# Patient Record
Sex: Female | Born: 1950 | Race: White | Hispanic: No | Marital: Single | Smoking: Current every day smoker
Health system: Southern US, Community
[De-identification: ages and names within clinical notes are randomized; demographics above are authoritative.]

## PROBLEM LIST (undated history)

## (undated) DIAGNOSIS — I82409 Acute embolism and thrombosis of unspecified deep veins of unspecified lower extremity: Secondary | ICD-10-CM

## (undated) DIAGNOSIS — I2510 Atherosclerotic heart disease of native coronary artery without angina pectoris: Secondary | ICD-10-CM

## (undated) DIAGNOSIS — G510 Bell's palsy: Secondary | ICD-10-CM

## (undated) DIAGNOSIS — R0600 Dyspnea, unspecified: Secondary | ICD-10-CM

## (undated) DIAGNOSIS — F419 Anxiety disorder, unspecified: Secondary | ICD-10-CM

## (undated) DIAGNOSIS — F329 Major depressive disorder, single episode, unspecified: Secondary | ICD-10-CM

## (undated) DIAGNOSIS — I10 Essential (primary) hypertension: Secondary | ICD-10-CM

## (undated) DIAGNOSIS — D649 Anemia, unspecified: Secondary | ICD-10-CM

## (undated) DIAGNOSIS — M1990 Unspecified osteoarthritis, unspecified site: Secondary | ICD-10-CM

## (undated) DIAGNOSIS — K219 Gastro-esophageal reflux disease without esophagitis: Secondary | ICD-10-CM

## (undated) DIAGNOSIS — F32A Depression, unspecified: Secondary | ICD-10-CM

## (undated) DIAGNOSIS — I2699 Other pulmonary embolism without acute cor pulmonale: Secondary | ICD-10-CM

## (undated) DIAGNOSIS — I219 Acute myocardial infarction, unspecified: Secondary | ICD-10-CM

## (undated) DIAGNOSIS — R42 Dizziness and giddiness: Secondary | ICD-10-CM

## (undated) DIAGNOSIS — Q639 Congenital malformation of kidney, unspecified: Secondary | ICD-10-CM

## (undated) DIAGNOSIS — E785 Hyperlipidemia, unspecified: Secondary | ICD-10-CM

## (undated) DIAGNOSIS — H35039 Hypertensive retinopathy, unspecified eye: Secondary | ICD-10-CM

## (undated) DIAGNOSIS — G629 Polyneuropathy, unspecified: Secondary | ICD-10-CM

## (undated) DIAGNOSIS — Z720 Tobacco use: Secondary | ICD-10-CM

## (undated) DIAGNOSIS — R011 Cardiac murmur, unspecified: Secondary | ICD-10-CM

## (undated) HISTORY — DX: Hypertensive retinopathy, unspecified eye: H35.039

## (undated) HISTORY — DX: Acute myocardial infarction, unspecified: I21.9

## (undated) HISTORY — PX: TONSILLECTOMY: SUR1361

## (undated) HISTORY — PX: KNEE ARTHROSCOPY W/ MENISCAL REPAIR: SHX1877

## (undated) HISTORY — PX: CAROTID ARTERY ANGIOPLASTY: SHX1300

## (undated) HISTORY — PX: CARPAL TUNNEL RELEASE: SHX101

## (undated) HISTORY — DX: Dizziness and giddiness: R42

## (undated) NOTE — *Deleted (*Deleted)
[facility] - Clinic Note  01/23/2020     CHIEF COMPLAINT Patient presents for No chief complaint on file.   HISTORY OF PRESENT ILLNESS: Jennifer [name] is a 20 y.o. female who presents to the clinic today for:   pt is here on the referral of Dr. Despina Arias for concern of fol and floaters, she states she was sitting on her porch yesterday and suddenly "lost vision" in her right eye, she states she started to see fol and splotchy floaters as well as a "grey pudding" clouding her vision, she states it started to clear up a little yesterday afternoon, pt has had cataract sx with Dr. Genia Del in April  Referring physician: Elfredia Nevins, MD [address] [place],  [place] [address]  HISTORICAL INFORMATION:   Selected notes from the MEDICAL RECORD NUMBER Referred by Dr. Nedra Hai:  Ocular Hx- PMH-    CURRENT MEDICATIONS: No current outpatient medications on file. (Ophthalmic Drugs)   No current facility-administered medications for this visit. (Ophthalmic Drugs)   Current Outpatient Medications (Other)  Medication Sig  . allopurinol (ZYLOPRIM) 300 MG tablet Take 300 mg by mouth daily.  Marland Kitchen aspirin EC 81 MG EC tablet Take 1 tablet (81 mg total) by mouth daily.  Marland Kitchen atorvastatin (LIPITOR) 20 MG tablet   . calcium carbonate (TUMS - DOSED IN MG ELEMENTAL CALCIUM) 500 MG chewable tablet Chew 2 tablets by mouth 3 (three) times daily with meals.   . carvedilol (COREG) 3.125 MG tablet TAKE (1) TABLET BY MOUTH TWICE DAILY.  Marland Kitchen Cholecalciferol (VITAMIN D) 2000 units CAPS Take 2,000 Units by mouth daily.  . DULoxetine (CYMBALTA) 30 MG capsule Take 30 mg by mouth daily.  Marland Kitchen FeFum-FePoly-FA-B Cmp-C-Biot (INTEGRA PLUS) CAPS Take 1 capsule daily with a meal  . gabapentin (NEURONTIN) 600 MG tablet Take 300-600 mg by mouth See admin instructions. Take 300 mg twice daily IN THE MORNING AND AT NOON AND  600 mg at bedtime.  Marland Kitchen lisinopril (ZESTRIL) 40 MG tablet Take 1 tablet (40 mg total) by  mouth daily.  . Omega-3 Fatty Acids (FISH OIL) 1360 MG CAPS Take by mouth.  . Oxycodone HCl 10 MG TABS Take 10 mg by mouth 3 (three) times daily as needed. For pain  . rivaroxaban (XARELTO) 20 MG TABS tablet Take 20 mg by mouth daily with supper.   No current facility-administered medications for this visit. (Other)      REVIEW OF SYSTEMS:    ALLERGIES Allergies  Allergen Reactions  . Bee Venom Anaphylaxis    PAST MEDICAL HISTORY Past Medical History:  Diagnosis Date  . Anemia   . Anxiety   . Arthritis    pt. denies  . Bell's palsy   . CAD (coronary artery disease)    a. Cath 12/2014 - mild-moderate non-obstructive CAD with 60% mid RCA, 50% mid Cx-distal Cx, 30% distal LAD, 25% D2, EF 55-65%. b. 02/2017: NSTEMI with cath showing 60% LM stenosis and 100% RCA stenosis --> CT Surgery consulted with CABG on 02/21/2017 (LIMA-LAD, SVG-OM, and SVG-PDA)  . Depression   . Dizziness   . DVT (deep venous thrombosis) (HCC)    on Xarelto  . Dyspnea    W/ EXERTION pt. denies  . GERD (gastroesophageal reflux disease)   . Heart murmur    DX   AT BIRTH  . Hyperlipidemia   . Hypertension   . 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 Myocardial infarction (HCC)   .  Neuropathy   . PE (pulmonary embolism)   . Tobacco abuse    Past Surgical History:  Procedure Laterality Date  . ABDOMINAL HYSTERECTOMY  1974  . CARDIAC CATHETERIZATION N/A 01/08/2015   Procedure: Left Heart Cath and Coronary Angiography;  Surgeon: Dolores Patty, MD;  Location: [facility] INVASIVE CV LAB;  Service: Cardiovascular;  Laterality: N/A;  . CAROTID ARTERY ANGIOPLASTY     stent right side  . CARPAL TUNNEL RELEASE     RIGHT  . COLONOSCOPY WITH PROPOFOL N/A 02/16/2017   Procedure: COLONOSCOPY WITH PROPOFOL;  Surgeon: Rachael Fee, MD;  Location: [facility] ENDOSCOPY;  Service: Endoscopy;  Laterality: N/A;  . CORONARY ARTERY BYPASS GRAFT N/A 02/21/2017   Procedure: CORONARY ARTERY BYPASS GRAFTING (CABG) times  three. LIMA to LAD, SVG to OM and SVG to PDA;  Surgeon: Loreli Slot, MD;  Location: [facility] OR;  Service: Open Heart Surgery;  Laterality: N/A;  . CORONARY STENT PLACEMENT Right 1980  . ENTEROSCOPY N/A 06/14/2017   Procedure: ENTEROSCOPY;  Surgeon: Rachael Fee, MD;  Location: [facility] ENDOSCOPY;  Service: Endoscopy;  Laterality: N/A;  . ESOPHAGOGASTRODUODENOSCOPY (EGD) WITH PROPOFOL N/A 02/16/2017   Procedure: ESOPHAGOGASTRODUODENOSCOPY (EGD) WITH PROPOFOL;  Surgeon: Rachael Fee, MD;  Location: [facility] ENDOSCOPY;  Service: Endoscopy;  Laterality: N/A;  . GIVENS CAPSULE STUDY N/A 05/08/2017   Procedure: GIVENS CAPSULE STUDY;  Surgeon: Rachael Fee, MD;  Location: [facility] ENDOSCOPY;  Service: Endoscopy;  Laterality: N/A;  . KNEE ARTHROSCOPY W/ MENISCAL REPAIR     LEFT KNEE   FORMED DVT (SURG TO REMOVE BLOOD CLOT)  . LEFT HEART CATH AND CORONARY ANGIOGRAPHY N/A 02/19/2017   Procedure: LEFT HEART CATH AND CORONARY ANGIOGRAPHY;  Surgeon: Yvonne Kendall, MD;  Location: [facility] INVASIVE CV LAB;  Service: Cardiovascular;  Laterality: N/A;  . TEE WITHOUT CARDIOVERSION N/A 02/21/2017   Procedure: TRANSESOPHAGEAL ECHOCARDIOGRAM (TEE);  Surgeon: Loreli Slot, MD;  Location: [facility] OR;  Service: Open Heart Surgery;  Laterality: N/A;  . TONSILLECTOMY      FAMILY HISTORY Family History  Problem Relation Age of Onset  . Heart attack Mother   . AAA (abdominal aortic aneurysm) Mother   . Heart attack Father   . Heart disease Father   . Heart attack Brother        CABG  . Heart disease Brother   . Heart attack Brother        CABG  . Heart attack Brother        CABG  . Ovarian cancer Maternal Grandmother   . Heart disease Maternal Grandfather   . Dementia Paternal Grandmother     SOCIAL HISTORY Social History   Tobacco Use  . Smoking status: Current Every Day Smoker    Packs/day: 0.25    Years: 50.00    Pack years: 12.50    Types: Cigarettes    Start date: 06/18/1967  . Smokeless tobacco:  Never Used  . Tobacco comment: 3-4 a day  Vaping Use  . Vaping Use: Never used  Substance Use Topics  . Alcohol use: No    Alcohol/week: 0.0 standard drinks  . Drug use: No         OPHTHALMIC EXAM:  Not recorded     IMAGING AND PROCEDURES  Imaging and Procedures for 01/23/2020           ASSESSMENT/PLAN:    ICD-10-CM   1. Posterior vitreous detachment of right eye  H43.811   2. Retinal edema  H35.81 OCT, Retina -  OU - Both Eyes  3. Essential hypertension  I10   4. Hypertensive retinopathy of both eyes  H35.033   5. Pseudophakia, both eyes  Z96.1     1,2. Partial PVD / vitreous syneresis OD  - symptomatic flashes/floaters x1 day w/ transient decrease in vision -- improved today  - Discussed findings and prognosis  - No RT or RD on 360 scleral depressed exam  - Reviewed s/s of RT/RD  - Strict return precautions for any such RT/RD signs/symptoms  - f/u in 4-6 wks, sooner prn -- DFE/OCT  3,4. Hypertensive retinopathy OU - discussed importance of tight BP control - monitor  5. Pseudophakia OU  - s/p CE/IOL (Dr. Genia Del, 06/2019)  - IOL in good position, doing well  - monitor   Ophthalmic Meds Ordered this visit:  No orders of the defined types were placed in this encounter.      No follow-ups on file.  There are no Patient Instructions on file for this visit.   Explained the diagnoses, plan, and follow up with the patient and they expressed understanding.  Patient expressed understanding of the importance of proper follow up care.   This document serves as a record of services personally performed by Karie Chimera, MD, PhD. It was created on their behalf by Glee Arvin. Manson Passey, OA an ophthalmic technician. The creation of this record is the provider's dictation and/or activities during the visit.    Electronically signed by: Glee Arvin. Manson Passey, [place] 11.08.2021 1:10 PM   Karie Chimera, M.D., Ph.D. Diseases & Surgery of the Retina and Vitreous [facility]    Abbreviations: M myopia (nearsighted); A astigmatism; H hyperopia (farsighted); P presbyopia; Mrx spectacle prescription;  CTL contact lenses; OD right eye; OS left eye; OU both eyes  XT exotropia; ET esotropia; PEK punctate epithelial keratitis; PEE punctate epithelial erosions; DES dry eye syndrome; MGD meibomian gland dysfunction; ATs artificial tears; PFAT's preservative free artificial tears; NSC nuclear sclerotic cataract; PSC posterior subcapsular cataract; ERM epi-retinal membrane; PVD posterior vitreous detachment; RD retinal detachment; DM diabetes mellitus; DR diabetic retinopathy; NPDR non-proliferative diabetic retinopathy; PDR proliferative diabetic retinopathy; CSME clinically significant macular edema; DME diabetic macular edema; dbh dot blot hemorrhages; CWS cotton wool spot; POAG primary open angle glaucoma; C/D cup-to-disc ratio; HVF humphrey visual field; GVF goldmann visual field; OCT optical coherence tomography; IOP intraocular pressure; BRVO Branch retinal vein occlusion; CRVO central retinal vein occlusion; CRAO central retinal artery occlusion; BRAO branch retinal artery occlusion; RT retinal tear; SB scleral buckle; PPV pars plana vitrectomy; VH Vitreous hemorrhage; PRP panretinal laser photocoagulation; IVK intravitreal kenalog; VMT vitreomacular traction; MH Macular hole;  NVD neovascularization of the disc; NVE neovascularization elsewhere; AREDS age related eye disease study; ARMD age related macular degeneration; POAG primary open angle glaucoma; EBMD epithelial/anterior basement membrane dystrophy; ACIOL anterior chamber intraocular lens; IOL intraocular lens; PCIOL posterior chamber intraocular lens; Phaco/IOL phacoemulsification with intraocular lens placement; PRK photorefractive keratectomy; LASIK laser assisted in situ keratomileusis; HTN hypertension; DM diabetes mellitus; COPD chronic obstructive pulmonary disease

---

## 1972-03-13 HISTORY — PX: ABDOMINAL HYSTERECTOMY: SHX81

## 1978-03-13 HISTORY — PX: CORONARY STENT PLACEMENT: SHX1402

## 1998-09-21 ENCOUNTER — Inpatient Hospital Stay (HOSPITAL_COMMUNITY): Admission: AD | Admit: 1998-09-21 | Discharge: 1998-09-24 | Payer: Self-pay | Admitting: *Deleted

## 2000-03-09 ENCOUNTER — Inpatient Hospital Stay (HOSPITAL_COMMUNITY): Admission: EM | Admit: 2000-03-09 | Discharge: 2000-03-14 | Payer: Self-pay | Admitting: Psychiatry

## 2000-12-13 ENCOUNTER — Ambulatory Visit (HOSPITAL_COMMUNITY): Admission: RE | Admit: 2000-12-13 | Discharge: 2000-12-13 | Payer: Self-pay | Admitting: Internal Medicine

## 2000-12-13 ENCOUNTER — Encounter: Payer: Self-pay | Admitting: Internal Medicine

## 2001-07-31 ENCOUNTER — Inpatient Hospital Stay (HOSPITAL_COMMUNITY): Admission: AD | Admit: 2001-07-31 | Discharge: 2001-08-06 | Payer: Self-pay | Admitting: Family Medicine

## 2001-07-31 ENCOUNTER — Encounter: Payer: Self-pay | Admitting: Family Medicine

## 2001-08-01 ENCOUNTER — Encounter: Payer: Self-pay | Admitting: Family Medicine

## 2004-04-02 ENCOUNTER — Emergency Department (HOSPITAL_COMMUNITY): Admission: EM | Admit: 2004-04-02 | Discharge: 2004-04-02 | Payer: Self-pay | Admitting: Emergency Medicine

## 2004-06-05 ENCOUNTER — Emergency Department (HOSPITAL_COMMUNITY): Admission: EM | Admit: 2004-06-05 | Discharge: 2004-06-05 | Payer: Self-pay | Admitting: Emergency Medicine

## 2004-06-06 ENCOUNTER — Inpatient Hospital Stay (HOSPITAL_COMMUNITY): Admission: AD | Admit: 2004-06-06 | Discharge: 2004-06-10 | Payer: Self-pay | Admitting: Emergency Medicine

## 2004-06-12 ENCOUNTER — Emergency Department (HOSPITAL_COMMUNITY): Admission: EM | Admit: 2004-06-12 | Discharge: 2004-06-13 | Payer: Self-pay | Admitting: Emergency Medicine

## 2004-10-05 ENCOUNTER — Encounter: Admission: RE | Admit: 2004-10-05 | Discharge: 2004-10-18 | Payer: Self-pay | Admitting: Orthopaedic Surgery

## 2006-07-11 ENCOUNTER — Ambulatory Visit (HOSPITAL_COMMUNITY): Admission: RE | Admit: 2006-07-11 | Discharge: 2006-07-11 | Payer: Self-pay | Admitting: Family Medicine

## 2009-06-19 ENCOUNTER — Emergency Department (HOSPITAL_COMMUNITY): Admission: EM | Admit: 2009-06-19 | Discharge: 2009-06-19 | Payer: Self-pay | Admitting: Emergency Medicine

## 2009-06-20 ENCOUNTER — Ambulatory Visit (HOSPITAL_COMMUNITY)
Admission: RE | Admit: 2009-06-20 | Discharge: 2009-06-20 | Payer: Self-pay | Source: Home / Self Care | Admitting: Emergency Medicine

## 2010-04-07 ENCOUNTER — Ambulatory Visit (HOSPITAL_COMMUNITY)
Admission: RE | Admit: 2010-04-07 | Discharge: 2010-04-07 | Payer: Self-pay | Source: Home / Self Care | Attending: Gastroenterology | Admitting: Gastroenterology

## 2010-04-20 ENCOUNTER — Other Ambulatory Visit (HOSPITAL_COMMUNITY): Payer: Self-pay | Admitting: *Deleted

## 2010-04-20 ENCOUNTER — Other Ambulatory Visit: Payer: Self-pay

## 2010-04-20 DIAGNOSIS — R011 Cardiac murmur, unspecified: Secondary | ICD-10-CM

## 2010-04-25 ENCOUNTER — Ambulatory Visit (HOSPITAL_COMMUNITY)
Admission: RE | Admit: 2010-04-25 | Discharge: 2010-04-25 | Disposition: A | Discharge status: Home / Self Care | Payer: Self-pay | Source: Ambulatory Visit | Attending: Gastroenterology | Admitting: Gastroenterology

## 2010-04-25 DIAGNOSIS — R011 Cardiac murmur, unspecified: Secondary | ICD-10-CM

## 2010-04-25 DIAGNOSIS — I10 Essential (primary) hypertension: Secondary | ICD-10-CM | POA: Insufficient documentation

## 2010-04-27 ENCOUNTER — Ambulatory Visit (HOSPITAL_COMMUNITY)
Admission: RE | Admit: 2010-04-27 | Discharge: 2010-04-27 | Disposition: A | Discharge status: Home / Self Care | Payer: Self-pay | Source: Ambulatory Visit | Attending: Gastroenterology | Admitting: Gastroenterology

## 2010-04-27 DIAGNOSIS — I658 Occlusion and stenosis of other precerebral arteries: Secondary | ICD-10-CM | POA: Insufficient documentation

## 2010-04-27 DIAGNOSIS — I6529 Occlusion and stenosis of unspecified carotid artery: Secondary | ICD-10-CM | POA: Insufficient documentation

## 2010-04-27 DIAGNOSIS — R011 Cardiac murmur, unspecified: Secondary | ICD-10-CM | POA: Insufficient documentation

## 2010-06-01 LAB — PROTIME-INR
INR: 3.34 — ABNORMAL HIGH (ref 0.00–1.49)
Prothrombin Time: 33.6 seconds — ABNORMAL HIGH (ref 11.6–15.2)

## 2010-06-24 ENCOUNTER — Observation Stay (HOSPITAL_COMMUNITY): Payer: Self-pay

## 2010-06-24 ENCOUNTER — Emergency Department (HOSPITAL_COMMUNITY): Payer: Self-pay

## 2010-06-24 ENCOUNTER — Observation Stay (HOSPITAL_COMMUNITY)
Admission: EM | Admit: 2010-06-24 | Discharge: 2010-06-25 | Disposition: A | Discharge status: Home / Self Care | Payer: Self-pay | Attending: Emergency Medicine | Admitting: Emergency Medicine

## 2010-06-24 DIAGNOSIS — T45515A Adverse effect of anticoagulants, initial encounter: Secondary | ICD-10-CM | POA: Insufficient documentation

## 2010-06-24 DIAGNOSIS — I10 Essential (primary) hypertension: Secondary | ICD-10-CM | POA: Insufficient documentation

## 2010-06-24 DIAGNOSIS — E785 Hyperlipidemia, unspecified: Secondary | ICD-10-CM | POA: Insufficient documentation

## 2010-06-24 DIAGNOSIS — Z79899 Other long term (current) drug therapy: Secondary | ICD-10-CM | POA: Insufficient documentation

## 2010-06-24 DIAGNOSIS — M1710 Unilateral primary osteoarthritis, unspecified knee: Secondary | ICD-10-CM | POA: Insufficient documentation

## 2010-06-24 DIAGNOSIS — M79609 Pain in unspecified limb: Principal | ICD-10-CM | POA: Insufficient documentation

## 2010-06-24 DIAGNOSIS — R1032 Left lower quadrant pain: Secondary | ICD-10-CM | POA: Insufficient documentation

## 2010-06-24 DIAGNOSIS — Z86718 Personal history of other venous thrombosis and embolism: Secondary | ICD-10-CM | POA: Insufficient documentation

## 2010-06-24 DIAGNOSIS — F329 Major depressive disorder, single episode, unspecified: Secondary | ICD-10-CM | POA: Insufficient documentation

## 2010-06-24 DIAGNOSIS — F3289 Other specified depressive episodes: Secondary | ICD-10-CM | POA: Insufficient documentation

## 2010-06-24 DIAGNOSIS — R791 Abnormal coagulation profile: Secondary | ICD-10-CM | POA: Insufficient documentation

## 2010-06-24 DIAGNOSIS — E876 Hypokalemia: Secondary | ICD-10-CM | POA: Insufficient documentation

## 2010-06-24 DIAGNOSIS — IMO0002 Reserved for concepts with insufficient information to code with codable children: Secondary | ICD-10-CM | POA: Insufficient documentation

## 2010-06-24 LAB — DIFFERENTIAL
Basophils Absolute: 0 10*3/uL (ref 0.0–0.1)
Basophils Relative: 0 % (ref 0–1)
Eosinophils Absolute: 0.1 10*3/uL (ref 0.0–0.7)
Eosinophils Relative: 1 % (ref 0–5)
Lymphocytes Relative: 24 % (ref 12–46)
Lymphs Abs: 2.1 10*3/uL (ref 0.7–4.0)
Monocytes Absolute: 0.5 10*3/uL (ref 0.1–1.0)
Monocytes Relative: 6 % (ref 3–12)
Neutro Abs: 5.8 10*3/uL (ref 1.7–7.7)
Neutrophils Relative %: 69 % (ref 43–77)

## 2010-06-24 LAB — BASIC METABOLIC PANEL
BUN: 11 mg/dL (ref 6–23)
CO2: 26 mEq/L (ref 19–32)
Calcium: 9 mg/dL (ref 8.4–10.5)
Chloride: 100 mEq/L (ref 96–112)
Creatinine, Ser: 1.17 mg/dL (ref 0.4–1.2)
GFR calc Af Amer: 57 mL/min — ABNORMAL LOW (ref >60)
GFR calc non Af Amer: 47 mL/min — ABNORMAL LOW (ref >60)
Glucose, Bld: 128 mg/dL — ABNORMAL HIGH (ref 70–99)
Potassium: 2.9 mEq/L — ABNORMAL LOW (ref 3.5–5.1)
Sodium: 139 mEq/L (ref 135–145)

## 2010-06-24 LAB — CBC
HCT: 36.1 % (ref 36.0–46.0)
Hemoglobin: 12 g/dL (ref 12.0–15.0)
MCH: 28.1 pg (ref 26.0–34.0)
MCHC: 33.2 g/dL (ref 30.0–36.0)
MCV: 84.5 fL (ref 78.0–100.0)
Platelets: 360 10*3/uL (ref 150–400)
RBC: 4.27 MIL/uL (ref 3.87–5.11)
RDW: 14.6 % (ref 11.5–15.5)
WBC: 8.5 10*3/uL (ref 4.0–10.5)

## 2010-06-24 LAB — POTASSIUM: Potassium: 3.2 mEq/L — ABNORMAL LOW (ref 3.5–5.1)

## 2010-06-24 LAB — PROTIME-INR
INR: 2.11 — ABNORMAL HIGH (ref 0.00–1.49)
Prothrombin Time: 23.8 seconds — ABNORMAL HIGH (ref 11.6–15.2)

## 2010-06-24 MED ORDER — IOHEXOL 300 MG/ML  SOLN
120.0000 mL | Freq: Once | INTRAMUSCULAR | Status: AC | PRN
  Administered 2010-06-24: 120 mL via INTRAVENOUS

## 2010-06-25 LAB — URINALYSIS, ROUTINE W REFLEX MICROSCOPIC
Bilirubin Urine: NEGATIVE
Glucose, UA: NEGATIVE mg/dL
Hgb urine dipstick: NEGATIVE
Ketones, ur: NEGATIVE mg/dL
Nitrite: NEGATIVE
Protein, ur: NEGATIVE mg/dL
Specific Gravity, Urine: 1.025 (ref 1.005–1.030)
Urobilinogen, UA: 0.2 mg/dL (ref 0.0–1.0)
pH: 5 (ref 5.0–8.0)

## 2010-06-25 LAB — POTASSIUM: Potassium: 3.4 mEq/L — ABNORMAL LOW (ref 3.5–5.1)

## 2010-06-25 LAB — PROTIME-INR
INR: 3.01 — ABNORMAL HIGH (ref 0.00–1.49)
Prothrombin Time: 31.3 seconds — ABNORMAL HIGH (ref 11.6–15.2)

## 2010-06-27 LAB — URINE CULTURE
Colony Count: >=100000
Culture  Setup Time: 201204141945
Special Requests: NEGATIVE

## 2010-07-14 NOTE — H&P (Signed)
[name], [name]                ACCOUNT NO.:  [id number]  MEDICAL RECORD NO.:  [id number]           PATIENT TYPE:  O  LOCATION:  A306                          FACILITY:  [facility]  PHYSICIAN:  Lawson Mahone, DO         DATE OF BIRTH:  12/23/1950  DATE OF ADMISSION:  06/24/2010 DATE OF DISCHARGE:  LH                             HISTORY & PHYSICAL   CHIEF COMPLAINT:  Left lower quadrant, left lower extremity pain.  HISTORY OF PRESENT ILLNESS:  The patient is a 60 year old female who states that she was at home in bed this morning about 4 o'clock she started having pain in her left lower extremity.  She has described it as dull.  She has described it as sharp.  She has described as cramping. She states it is 10/10 and it does not have palliative or provocative factors.  She states now although initially it did not radiate now it is causing her to have discomfort in the left lower quadrant and in her left groin.  PAST MEDICAL HISTORY:  Significant for depression, DVT, PE, hypertension, hyperlipidemia.  PAST SURGICAL HISTORY:  Significant for left knee surgery, carpal tunnel surgery, total abdominal hysterectomy in 1984, tonsils and adenoids in 1960s.  She has also had a vascular and embolectomy of the left lower extremity in the past.  FAMILY HISTORY:  Positive for coronary artery disease, positive for clots.  SOCIAL HISTORY:  The patient was a Naval architect.  She has roughly a 70 pack year smoking history.  She is now on Chantix, trying to stop.  No alcohol.  No recreational drug use.  REVIEW OF SYSTEMS:  CONSTITUTIONAL:  Negative for fever.  Negative for chills.  Positive for weakness.  Positive for fatigue.  CNS:  No headaches.  No seizures.  No limb weakness.  EARS, NOSE, AND THROAT:  No nasal congestion, throat pain, and no coryza.  CARDIOVASCULAR:  No chest pain.  No palpitations.  No orthopnea.  RESPIRATORY:  No cough.  No shortness breath.  No wheezing.  GASTROINTESTINAL:   No nausea, no vomiting.  No constipation or diarrhea.  GENITOURINARY:  No dysuria, no hematuria, no urinary frequency.  RENAL:  No flank pain.  No swelling pruritus.  SKIN:  No rashes, no sores, or lesions.  HEMATOLOGICAL:  No easy bruising, no purpura, no clots.  LYMPH:  No lymphadenopathy.  No painful lymph nodes.  No specific lymph swelling.  PSYCHIATRIC:  No anxiety.  No depression or insomnia.  PHYSICAL EXAMINATION:  VITAL SIGNS:  Temperature 97.5, pulse 74 rest rate 16, blood pressure 134/85, but she is a 98% on room air. GENERAL:  The patient is a morbidly obese elderly female who is awake, alert, and oriented x3 and able to give fair history. HEENT:  Eyes:  Pupils equal, round and react to light condition. External ocular movements are bilaterally intact.  Sclarea nonicteric, noninjected.  Mouth and oral mucosa are moist.  No lesions or sores. Pharynx clear.  No erythema. NECK:  Negative for JVD.  Negative for thyromegaly.  Negative for lymphadenopathy. HEART:  Regular rate and  rhythm 80 beats per minute without murmur, ectopy, or gallops.  No lateral PMI.  No thrills. LUNGS: Clear to auscultation bilaterally with no wheezes, rales or rhonchi.  No increased work of breathing.  No tactile fremitus. ABDOMEN:  Hugely morbidly obese, unable to evaluate for hepatosplenomegaly.  No hernias palpated.  It is nontender 1000, positive but distant for this in bowel sounds. CARDIOVASCULAR:  Extremities are negative for cyanosis, clubbing.  The patient has or pitting edema.  She states the patient has burning he will extremities bilaterally. NEUROLOGICAL:  Cranial nerves II through XII grossly intact.  Memory and sensory intact.  LABORATORY:  Sodium 139, potassium 2.9, chloride 100, CO2 26, BUN 11, creatinine 1.17, calcium 9, INR 2.11, hemoglobin 12.0, hematocrit 36.1. WBC is 8.5, platelets 360,000.  Doppler of the left lower extremity was negative for clot left femur x-ray showed no  fracture.  The patient does have osteophytosis and joint space narrowing of the medial compartment of her left knee.  ASSESSMENT: 1. Left lower extremity pain.  Differential diagnosis includes     hyperkalemia, diverticulitis, myalgia parasitica, and pelvic clots. 2. Hypokalemia which has been partially treated. 3. Coagulopathy.  The patient's INR is 2.11 on 7.5 mg of Coumadin     daily.  She is not therapeutic for the indications for which she is     on Coumadin.  Her INR should be 3 to 3.5.  We will put her on     Lovenox until that can be achieved. 4. Hypertension. 5. History of deep vein thrombosis and pulmonary embolism. 6. Hyperlipidemia. 7. Depression.  PLAN: 1. Replete potassium. 2. CT with contrast of the left lower extremity abdomen and pelvis. 3. Pain control. 4. PT/OT. 5. Continue home medications. 6. Lovenox until the patient can be therapeutic on her Coumadin.  I spent 40 minutes on this admission.                                           ______________________________ Fran Lowes, DO     AS/MEDQ  D:  06/24/2010  T:  06/25/2010  Job:  [id number]  Electronically Signed by Fran Lowes DO on 07/14/2010 06:07:14 PM
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
## 2010-07-29 NOTE — Discharge Summary (Signed)
[facility]  Patient:    Jennifer [name], Jennifer [name] Visit Number: [id number] MRN: [id number]          Service Type: MED Location: 2A A217 01 Attending Physician:  Kirk Ruths Dictated by:   Karleen Hampshire, M.D. Admit Date:  07/31/2001 Discharge Date: 08/06/2001                             Discharge Summary  DISCHARGE DIAGNOSES: 1. Multiple pulmonary emboli. 2. Deep vein thrombosis of the left lower extremity--extensive. 3. Urinary tract infection. 4. Hypertension. 5. Depression.  HOSPITAL COURSE:  This 60 year old white female was admitted from the office complaining of close to 10-day history of right-sided pleuritic chest pain, with a two-day history of some swelling in her left lower extremity associated with shortness of breath and some dizziness. The patient had had surgery on her left knee approximately two months before this admission and for that reason it was suspected the patient had possible DVT PEs and was admitted to concentrated care. The patients spiral CT of the chest showed extensive bilateral pulmonary emboli involving branches to all lobes of both lungs, most pronounced on the right. On the patients Doppler of the lower extremities, the right lower extremity was normal but there was extensive DVT of the left lower extremity. As soon as these reports were known, the patient was transferred to the intensive care unit where she had initially been placed on Lovenox and since was changed to heparin. The patient also initially was noted to have many white cells in her urinalysis even though culture was negative. She was treated empirically with Rocephin. The patient was seen in consultation by Dr. Kari Baars after the question of an IVC filter was entertained. Dr. Juanetta Gosling basically with our treatment suggested IVC filter was not indicated at this time, but if she had a recurrent problem it would be indicated. The patient was in no  respiratory distress throughout her stay. Her vital signs remained stable, although she ran a low-grade fever for much of her stay. The patient was Coumadinized, was slow to reach her therapeutic range of an INR of 2-3. On the day of discharge her INR was 2.2, PT being 18. She had received 7.5 mg of Coumadin for three days and finally 10 mg the day before discharge. The patients CBC showed a hemoglobin of 10.5 which was up from the previous CBCs. Her cardiac enzymes were negative. She had a D-dimer which was significantly elevated at 1 and a repeat of 2.  DISPOSITION:  The patient was stable on discharge though she was still having some pleuritic pain.  FOLLOWUP:  She will be seen in the office in three days at which time we will check her PT.  DISCHARGE MEDICATIONS:  She is discharged home on Percocet 10 mg for pain, Coumadin 7.5 mg daily. Dictated by:   Karleen Hampshire, M.D. Attending Physician:  Kirk Ruths DD:  08/06/01 TD:  08/06/01 Job: [id number]
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
## 2010-07-29 NOTE — Group Therapy Note (Signed)
[facility]  Patient:    Jennifer [name], Jennifer [name] Visit Number: [id number] MRN: [id number]          Service Type: MED Location: 2A A217 01 Attending Physician:  Kirk Ruths Dictated by:   Kari Baars, M.D. Proc. Date: 08/05/01 Admit Date:  07/31/2001 Discharge Date: 08/06/2001                               Progress Note  PROBLEM:  Pulmonary embolism.  SUBJECTIVE:  Ms. Ziebarth seems to be doing better.  Her edema has resolved.  She has no complaints now.  OBJECTIVE:  Her examination shows that her chest is relatively clear.  Her heart is regular.  Her abdomen is soft.  ASSESSMENT:  She seems to be doing better.  PLAN:  To continue her treatments and medicines.  I agree that she can get up today.  She wants to get the Foley catheter out, so we are going to have that discontinued as well.  Have her continue all of her other medicines and treatments and follow.  Her prothrombin time is not adequately out, to allow her to be discharged home. Dictated by:   Kari Baars, M.D. Attending Physician:  Kirk Ruths DD:  08/05/01 TD:  08/06/01 Job: [id number]

## 2010-07-29 NOTE — H&P (Signed)
[facility]  Patient:    Jennifer [name], Jennifer [name] Visit Number: [id number] MRN: [id number]          Service Type: MED Location: ICCU IC09 01 Attending Physician:  Kirk Ruths Dictated by:   Karleen Hampshire, M.D. Admit Date:  07/31/2001                           History and Physical  CHIEF COMPLAINT:  Chest pain.  HISTORY OF PRESENT ILLNESS:  This is a 60 year old white female who had sudden onset of severe right-sided chest pain approximately 10 days ago.  The pain had gotten so severe she went to [facility] where she was evaluated and told it was not her heart.  She was seen here the following day where she was found to have some tender chest wall, treated with anti-inflammatories, muscle relaxers, and pain medications.  In the meanwhile, the patient has had progressive pain in the right side of her chest increased with deep breathing.  It is also associated with some shortness of breath, pain in her right shoulder, dizziness, and today she has been having nausea and vomiting with severe reflux.  The patient also notes her left lower extremity had started swelling approximately two days ago, and she is status post knee surgery x 2 months.  She is admitted for further workup, possible DVT with PE, rule out gallbladder disease.  ALLERGIES:  Allergic to no medications.  PAST MEDICAL HISTORY AND MEDICATIONS:  History of hypertension, for which she takes Vaseretic 10/25.  She is on Effexor, Seroquel 50 mg h.s.  The patient is status post hysterectomy, tonsillectomy, carpal tunnel surgery, and the knee arthroscopy done in March of this year by Dr. Ranell Patrick.  The patient also has depression.  REVIEW OF SYSTEMS:  The patient does note oliguria with dark urine in spite of good p.o. intake.  Weakness and dizziness.  PHYSICAL EXAMINATION:  VITAL SIGNS:  Afebrile, pulse 80 and regular, respirations 22 and unlabored but shallow, blood pressure  110/70.  HEENT:  TMs are normal.  Pupils are equal to light and accommodation. Oropharynx is benign.  NECK:  Supple.  Without JVD, bruit, or thyromegaly.  LUNGS:  Clear in all areas, although there are shallow inspirations with severe pain on the right flank on deep breathing.  There is tenderness over her right chest as well as the right upper quadrant.  Otherwise, no organomegaly or masses.  EXTREMITIES:  There is 2+ edema of the left lower extremity below the knee. No definite tenderness.  No clubbing or cyanosis.  ASSESSMENT: 1. Right-sided chest pain with shortness of breath and edema of her left lower    extremity status post left knee surgery which needs pulmonary embolus ruled    out. 2. Shortness of breath, nausea, vomiting, weakness, dizziness. 3. History of hypertension. Dictated by:   Karleen Hampshire, M.D. Attending Physician:  Kirk Ruths DD:  07/31/01 TD:  08/02/01 Job: [id number]

## 2010-07-29 NOTE — Consult Note (Signed)
[facility]  Patient:    Jennifer [name], Jennifer [name] Visit Number: [id number] MRN: [id number]          Service Type: MED Location: 2A A217 01 Attending Physician:  Kirk Ruths Dictated by:   Kari Baars, M.D. Proc. Date: 08/02/01 Admit Date:  07/31/2001                            Consultation Report  Patient of ______  REASON FOR CONSULTATION:  Pulmonary embolism.  HISTORY OF PRESENT ILLNESS:  Jennifer [name] is a 60 year old who has been in fairly good health but who had knee surgery about two months ago and then developed chest discomfort and shortness of breath.  She eventually came to the hospital and was admitted and diagnosed with pulmonary emboli, which were bilateral and extensive.  She also has extensive clot in the left leg.  She has had no previous history of any sort of clotting abnormalities.  She has been on heparin since admission and no further episodes of clotting, shortness of breath, etcetera.  PAST MEDICAL HISTORY:  She said that she otherwise has been pretty healthy. She has a history of depression.  She has a history of hypertension.  She says that her other medical problems are that she has obesity.  SOCIAL HISTORY:  She does have about a two-pack a day smoking history.  She does not drink any alcohol or use any other medications.  PHYSICAL EXAMINATION:  VITAL SIGNS:  Blood pressure 120/80, pulse 80.  HEENT:  Pupils are reactive.  Nose and throat are clear.  Mucous membranes are moist.  CHEST:  Fairly clear without wheezes.  HEART:  Regular without murmur, gallop or rub.  ABDOMEN:  Soft.  No masses are felt.  EXTREMITIES:  She has a surgical scar on her knee, which is from an arthroscopic surgery.  Her leg is slightly tender, no definite cords.  ASSESSMENT:  She has deep vein thrombosis with pulmonary embolism.  At this point, I would not be in favor of having her get a vena caval filter because of the fact that this  would require her to have lifelong Coumadin therapy.  In addition, under most circumstances filters are used with a second clot while on medications.  PLAN:  To have her continue with Coumadin and I think she can start getting up and around soon, and I plan to follow with you.  If she does have further episodes of what could be clotting activity, then she will need to have an arteriogram for definite documentation and at that point, we would need to look at doing a vena caval filter.  Thanks again for allowing me to see her with you. Dictated by:   Kari Baars, M.D. Attending Physician:  Kirk Ruths DD:  08/02/01 TD:  08/05/01 Job: [id number]

## 2010-07-29 NOTE — Discharge Summary (Signed)
Jennifer [name], Jennifer [name] NO.:  [id number]   MEDICAL RECORD NO.:  [id number]          PATIENT TYPE:  INP   LOCATION:  6735                         FACILITY:  [facility]   PHYSICIAN:  M. 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  The patient presented to [facility] on Sunday  with symptoms, and a CT revealed common femoral vein and saphenous vein  acute DVT.  The plan was for the patient to be transferred from [facility] to [facility] for venography with potential for lysis of  the venous thrombosis.   PAST MEDICAL HISTORY:  1. Hypertension.  2. History of pulmonary emboli four years ago.  3. History of DVT four years ago.   PAST SURGICAL HISTORY:  1. Left knee surgery approximately four years ago.  2. Carpal tunnel surgery in 1993.  3. Total abdominal hysterectomy in 1984.  4. T&A in the 1960s.   ALLERGIES:  NO KNOWN DRUG ALLERGIES.   SOCIAL HISTORY:  The patient is single.  [place]  She is presently a Naval architect.  The patient denies any alcohol  use.  The patient is a smoker for approximately 40 years at one and half  packs per day.   FAMILY HISTORY:  Noncontributory.   HOSPITAL COURSE:  As noted, the patient was admitted from [facility] to [facility] on June 06, 2004 to be treated for a  thrombolysis of a left lower extremity DVT by Dr. Ruel Favors.  Angiogram  performed at the time of her admission revealed an acute occlusive left  femoral/popliteal DVT with extension into the left external iliac vein.  There was no central iliac stenosis noted.  There was also preserved central  iliac outflow with  a patent distal IVC.  The patient had a transcatheter  placed in which 5 mg of TPA bolus was infused through the catheter, and the  patient was to be transfused with Retavase overnight.  Plan was for followup  arteriogram in 16 to 24 hours.  Heparin was run overnight with pharmacy  protocol.  The patient was seen by radiology P.A. the following day at which  time the patient felt like her leg was less tight and painful to her, and  decreased erythema was noted.  The patient was brought down to the  department for repeat venogram at which time it was noted that little  progress after 24-hour venous thrombolysis had been made.  Mechanical  thrombolectomy and the AngioJet cleared the clot, reestablishing antegrade  flow through the effected veins, with a significant amount of residual  thrombus being persistent.  This suggested that there might be a significant  chronic component to the patient's DVT.  It was decided at that time that  the catheter-directed venous thrombolysis would be restarted, with plans for  a followup venogram the following day.  The patient was brought down to the  radiology department the next day at which time it was noticed that she was  tolerating the lysis well.  However, the patient continued to experience  moderate left lower extremity edema and pain.  Venogram performed later that  day revealed that the occlusive left lower extremity femoral clot remained  despite nearly 48 hours of lysis.  A 7-mm and 8-mm balloon maceration of the  clot was performed throughout the entire femoral vein, and an outflow  channel was established with minimal antegrade flow.  Her fibrinogen at that  time was 221, and there was no evidence of complication or bleeding.  It was  decided by Dr. Ruel Favors at that time that Retavase through the infusion  catheter would continue through the sheath until the next day for an  additional followup venogram.  The patient again remained  overnight with  heparin being dosed per pharmacy protocol.  Venogram performed on March 30th  revealed that there had been no significant change in the chronic-appearing  thrombus within the left lower extremity.  Antegrade flow was established,  but persistent filling defects were noted.  It was decided at that time,  since no true interval change between the prior studies were apparent, that  it appeared that there was a persistent chronic thrombus throughout the  venous system.  It was decided that the sheath would be removed in one to  two hours and that long-term anticoagulation should be instituted at that  time.  Compression stockings were also utilized during the patient's stay.  The sheath was removed after two to three hours post-venogram, and the  patient had opted for Lovenox injections to be performed for two to three  days as an outpatient.  Coumadin at 5 mg daily for three days was also  instituted at this time.  The patient was to followup to see Dr. Regino Schultze on  April 30th at 8 o'clock a.m. for a PT/INR check in his house and to follow  with him in regards to her DVT prophylaxis.   LABORATORY DATA:  CBC on the day of admission revealed a white cell count of  10.8, hemoglobin 14.1, hematocrit 40.8, platelet count 317.  PT was 12.9,  INR 1.0, and fibrinogen at 259.  A followup INR was performed on March 28th  which revealed a value of 1.1, with a protime of 13.8.  Her CBC revealed a  stable hemoglobin at 12.8, hematocrit 37.2, and platelet count of 300.  Fibrinogen was also repeated, with the result of 176.  PTTs were drawn  routinely during the patient's admission for pharmacy monitoring.  A final  CBC which was performed on June 08, 2004 revealed a white cell count of  9.2, hemoglobin 11.8, hematocrit 34.2, and a platelet count of 221.   DISCHARGE INSTRUCTIONS:  1. The patient was instructed to continue her home medications for her     hypertension as prior to the  procedure.  2. Coumadin 5 mg p.o. to be taken daily until seen by Dr. Regino Schultze to      adjust based on her INR.  3. Lovenox injections to be given twice daily until seen in the office by      Dr. Regino Schultze, which was scheduled for Monday, June 13, 2004.  4. The patient was instructed  to wear compression stockings for at least      eight hours a day until seen by Dr. Regino Schultze.  5. Vicodin 5/500 mg, #30, prescription was given.  The patient was      instructed to take one to two tablets every four to six hours as needed      for pain.   ACTIVITY:  Basically, the patient was restricted from any strenuous exercise  until seen by Dr. Regino Schultze.   DIET:  No restrictions were involved with her diet.   WORK STATUS:  Special instructions were given to the patient in regards to  her work status.  The patient was instructed that she was to remain out of  work until further seen by Dr. Regino Schultze.   PROBLEM LIST AT TIME OF DISCHARGE:  1. 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  2. Status post attempted mechanical thrombectomy with AngioJet and venous      thrombolysis.  3. History of hypertension.  4. History of ongoing tobacco use.  The patient was informed that she      should really attempt to abstain from that habit.      Pamel   PC/MEDQ  D:  10/17/2004  T:  10/18/2004  Job:  [id number]   cc:   Kirk Ruths, M.D.  [address]  [place]  [place] [address]  Fax: [phone]

## 2010-07-29 NOTE — Group Therapy Note (Signed)
[facility]  Patient:    Jennifer [name], Jennifer [name] Visit Number: [id number] MRN: [id number]          Service Type: MED Location: 2A A217 01 Attending Physician:  Kirk Ruths Dictated by:   Kari Baars, M.D. Admit Date:  07/31/2001 Discharge Date: 08/06/2001                               Progress Note  PROBLEMS:  Pulmonary embolism.  SUBJECTIVE:  I have discussed with Dr. Regino Schultze Jennifer [name] situation.  She now has a therapeutic prothrombin time and I think it is okay for her to be discharged. Dictated by:   Kari Baars, M.D. Attending Physician:  Kirk Ruths DD:  08/06/01 TD:  08/07/01 Job: [id number]

## 2010-09-27 NOTE — Discharge Summary (Unsigned)
[name], [name] NO.:  [id number]  MEDICAL RECORD NO.:  [id number]  LOCATION:  A306                          FACILITY:  [facility]  PHYSICIAN:  Jennifer Garretson, DO         DATE OF BIRTH:  Oct 16, 1950  DATE OF ADMISSION:  06/24/2010 DATE OF DISCHARGE:  04/14/2012LH                              DISCHARGE SUMMARY   ADMISSION DIAGNOSES: 1. Left lower extremity pain. 2. Hypokalemia. 3. Coagulopathy. 4. Hypertension. 5. History of deep vein thrombosis and pulmonary embolism. 6. Hyperlipidemia.  HISTORY OF PRESENT ILLNESS:  Please see H and P.  HOSPITAL COURSE:  The patient was admitted.  The patient was given IV potassium.  CT of the left lower extremity, abdomen, and pelvis was performed, abdomen and pelvis showed no acute findings within the abdomen and pelvis.  CT of the left femur with contrast, no acute abnormalities of this left femur.  The patient has osteoarthritic, left knee and small knee joint effusion and probable meniscal cyst in the medial joint line.  She was given pain control.  She was continued on her home medications and she was placed on 1 mg/kg Lovenox until she was therapeutic on her Coumadin.  On June 25, 2010, the patient was feeling better and she was discharged to home in good condition.  DISCHARGE INSTRUCTIONS:  Include, the patient was followed with Dr. Regino [name] in 2 weeks.  She was to have a PT/INR drawn on June 28, 2010, and reported to Dr. Regino [name].  Discharge medications included: 1. Coumadin 5 mg 2 tablets by mouth daily for 2 days and then she was     to have her PT/INR checked. 2. Lyrica 75 mg one p.o. b.i.d. 3. Oxycodone 5 mg 1 p.o. q.4 hours p.r.n. pain. 4. Potassium chloride 20 mEq 1 p.o. daily. 5. Accuretic 1 tablet by mouth daily. 6. Enteric-coated aspirin 81 mg 1 p.o. daily. 7. Chantix 1 mg p.o. b.i.d. 8. Cymbalta 60 mg one p.o. daily. 9. Fish oil 1000 mg 2 tablets by mouth twice daily. 10.Norvasc 10 mg 1 p.o.  daily.  DIET:  Cardiac.  ACTIVITY:  As tolerated.  Diagnoses on day of discharge included: 1. Left lower extremity pain likely due to osteoarthritis. 2. Hypokalemia which was made replete. 3. Coagulopathy, the patient's Coumadin dosing was increased and she     was to have a PT/INR checked on Apri 17, 2012, and report to Dr.     Regino [name]. 4. Hypertension. 5. Deep vein thrombosis. 6. Hyperlipidemia. 7. Depression.                                           ______________________________ Fran Lowes, DO     AS/MEDQ  D:  09/26/2010  T:  09/27/2010  Job:  [id number]  cc:   Jennifer [name], M.D. Fax: [phone]

## 2010-11-29 NOTE — Discharge Summary (Unsigned)
NAMESARON, VANORMAN                ACCOUNT NO.:  [id number]  MEDICAL RECORD NO.:  [id number]  LOCATION:  A306                          FACILITY:  [facility]  PHYSICIAN:  Justyn Langham, DO         DATE OF BIRTH:  [birth year]  DATE OF ADMISSION:  06/24/2010 DATE OF DISCHARGE:  04/14/2012LH                              DISCHARGE SUMMARY   ADMISSION DIAGNOSES:  Included, left lower extremity pain, hypokalemia, coagulopathy, hypertension, history of deep vein thrombosis, and pulmonary embolus, hyperlipidemia, depression.  HISTORY OF PRESENT ILLNESS:  Please see H and P.  HOSPITAL COURSE:  The patient was given potassium supplement to her potassium levels.  We had a CT with contrast of the left lower extremity, abdomen, and pelvis, which demonstrated no acute findings identified in the abdomen and pelvis.  No acute abnormalities of the left femur.  The patient was given Lovenox and her Coumadin was increased to bring her up to therapeutic levels.  Her blood pressure was watched.  She has continued on her home medications for hyperlipidemia, depression, hypertension.  The patient continued to complain of left lower extremity pain.  I felt that this is meralgia paresthetica.  The patient was started on Lyrica.  Her potassium was made replete.  The patient was discharged to home in good condition.  She was to have a followup PT/INR on the 17th.  DISCHARGE INSTRUCTIONS:  Activity as tolerated.  She is to followup with Dr. Regino Schultze in 2 weeks.  She is to have a PT/INR drawn on the 17.  This was to be reported to Dr. Regino Schultze.  Diet was cardiac.  MEDICATIONS AT HOME:  Included, Coumadin 5 mg 2 p.o. daily x2 days, then she was to have a PT and INR drawn.  She was to follow Dr. Edison Simon instructions on Coumadin after that.  Lyrica 75 mg one p.o. b.i.d., oxycodone 5 mg one p.o. q.4 hours p.r.n. pain, potassium chloride 20 mEq one p.o. daily, Accuretic one tablet p.o. daily, enteric coated aspirin 81 mg  one p.o. daily, Chantix 1 mg one p.o. b.i.d., Cymbalta 60 mg one p.o. daily, fish oil 1000 mg 2 p.o. twice daily, Norvasc 10 mg one p.o. daily.  The patient was to follow up with Dr. Regino Schultze in 2-4 weeks.                                           ______________________________ Fran Lowes, DO     AS/MEDQ  D:  11/29/2010  T:  11/29/2010  Job:  [id number]

## 2011-02-20 ENCOUNTER — Emergency Department (HOSPITAL_COMMUNITY)
Admission: EM | Admit: 2011-02-20 | Discharge: 2011-02-20 | Disposition: A | Discharge status: Home / Self Care | Payer: Self-pay | Attending: Emergency Medicine | Admitting: Emergency Medicine

## 2011-02-20 DIAGNOSIS — N61 Mastitis without abscess: Secondary | ICD-10-CM | POA: Insufficient documentation

## 2011-02-20 DIAGNOSIS — N611 Abscess of the breast and nipple: Secondary | ICD-10-CM

## 2011-02-20 DIAGNOSIS — I10 Essential (primary) hypertension: Secondary | ICD-10-CM | POA: Insufficient documentation

## 2011-02-20 DIAGNOSIS — Z7901 Long term (current) use of anticoagulants: Secondary | ICD-10-CM | POA: Insufficient documentation

## 2011-02-20 DIAGNOSIS — Z9079 Acquired absence of other genital organ(s): Secondary | ICD-10-CM | POA: Insufficient documentation

## 2011-02-20 DIAGNOSIS — Z86718 Personal history of other venous thrombosis and embolism: Secondary | ICD-10-CM | POA: Insufficient documentation

## 2011-02-20 HISTORY — DX: Essential (primary) hypertension: I10

## 2011-02-20 HISTORY — DX: Depression, unspecified: F32.A

## 2011-02-20 HISTORY — DX: Major depressive disorder, single episode, unspecified: F32.9

## 2011-02-20 HISTORY — DX: Other pulmonary embolism without acute cor pulmonale: I26.99

## 2011-02-20 LAB — CBC
HCT: 40.2 % (ref 36.0–46.0)
Hemoglobin: 12.4 g/dL (ref 12.0–15.0)
MCH: 24.3 pg — ABNORMAL LOW (ref 26.0–34.0)
MCHC: 30.8 g/dL (ref 30.0–36.0)
MCV: 78.8 fL (ref 78.0–100.0)
Platelets: 327 10*3/uL (ref 150–400)
RBC: 5.1 MIL/uL (ref 3.87–5.11)
RDW: 17.2 % — ABNORMAL HIGH (ref 11.5–15.5)
WBC: 10.4 10*3/uL (ref 4.0–10.5)

## 2011-02-20 LAB — PROTIME-INR
INR: 1.25 (ref 0.00–1.49)
Prothrombin Time: 16 seconds — ABNORMAL HIGH (ref 11.6–15.2)

## 2011-02-20 LAB — BASIC METABOLIC PANEL
BUN: 10 mg/dL (ref 6–23)
CO2: 30 mEq/L (ref 19–32)
Calcium: 10.1 mg/dL (ref 8.4–10.5)
Chloride: 99 mEq/L (ref 96–112)
Creatinine, Ser: 1.06 mg/dL (ref 0.50–1.10)
GFR calc Af Amer: 64 mL/min — ABNORMAL LOW (ref >90)
GFR calc non Af Amer: 55 mL/min — ABNORMAL LOW (ref >90)
Glucose, Bld: 99 mg/dL (ref 70–99)
Potassium: 3.3 mEq/L — ABNORMAL LOW (ref 3.5–5.1)
Sodium: 138 mEq/L (ref 135–145)

## 2011-02-20 MED ORDER — SODIUM CHLORIDE 0.9 % IV SOLN
Freq: Once | INTRAVENOUS | Status: AC
  Administered 2011-02-20: 18:00:00 via INTRAVENOUS

## 2011-02-20 MED ORDER — POTASSIUM CHLORIDE CRYS ER 20 MEQ PO TBCR
40.0000 meq | EXTENDED_RELEASE_TABLET | Freq: Once | ORAL | Status: AC
  Administered 2011-02-20: 40 meq via ORAL
  Filled 2011-02-20: qty 2

## 2011-02-20 MED ORDER — VANCOMYCIN HCL IN DEXTROSE 1-5 GM/200ML-% IV SOLN
1000.0000 mg | Freq: Once | INTRAVENOUS | Status: AC
  Administered 2011-02-20: 1000 mg via INTRAVENOUS
  Filled 2011-02-20: qty 200

## 2011-02-20 MED ORDER — HYDROCODONE-ACETAMINOPHEN 5-325 MG PO TABS: ORAL_TABLET | ORAL | Status: DC

## 2011-02-20 MED ORDER — DOXYCYCLINE HYCLATE 100 MG PO CAPS: 100.0000 mg | ORAL_CAPSULE | Freq: Two times a day (BID) | ORAL | Status: AC

## 2011-02-20 MED ORDER — AMOXICILLIN-POT CLAVULANATE 875-125 MG PO TABS: 1.0000 | ORAL_TABLET | Freq: Two times a day (BID) | ORAL | Status: AC

## 2011-02-20 NOTE — ED Provider Notes (Signed)
Pt seen with PA She has abscess to breast, also localized cellulitis Also on coumadin for DVT Will need surgical consult and need abx Pt stable at this time  Joya Gaskins, MD 02/20/11 1731

## 2011-02-20 NOTE — ED Notes (Signed)
Pt has large abscess to left breast. Pt states "it has been getting worse for 1 month." area swollen with moderate redness present.

## 2011-02-20 NOTE — ED Notes (Signed)
Patient has extensive red area around breast, has an abscess noted to inner aspect of breast , states she has been using warm compresses at home for same for several weeks and was told by a nurse to be evaluated

## 2011-02-20 NOTE — ED Notes (Signed)
Message to patients brother at patient request to go home and she will call him when she is ready for discharge-- done by Lupita Leash pt. [facility].

## 2011-02-20 NOTE — ED Provider Notes (Signed)
History     CSN: [id number] Arrival date & time: 02/20/2011  4:28 PM   None     Chief Complaint  Patient presents with  . Recurrent Skin Infections    (Consider location/radiation/quality/duration/timing/severity/associated sxs/prior treatment) HPI Comments: Patient reports an abscess of the left breasts it started with a small area at the lower breasts approximately one month ago. The patient has been applying ointments and home remedies in an attempt to control this problem in the last few days the patient has noticed increasing redness and pain of the left breast. She denies drainage or bleeding from the nipple area. She denies injury or trauma to the left breasts. There has been no human bite to the left breasts. She now notices an abscess of the left breasts with increasing redness surrounding this area. It is of note that the patient is on Coumadin due to previous history of pulmonary embolism  The patient has not been seen by primary care physician for this problem. She now requests evaluation in the emergency department concerning this issue.  The history is provided by the patient.    Past Medical History  Diagnosis Date  . Depression   . Hypertension   . DVT (deep vein thrombosis) in pregnancy   . PE (pulmonary embolism)     Past Surgical History  Procedure Date  . Tonsillectomy   . Abdominal hysterectomy     No family history on file.  History  Substance Use Topics  . Smoking status: Never Smoker   . Smokeless tobacco: Not on file  . Alcohol Use: No    OB History    Grav Para Term Preterm Abortions TAB SAB Ect Mult Living                  Review of Systems  Constitutional: Negative for activity change.       All ROS Neg except as noted in HPI  HENT: Negative for nosebleeds and neck pain.   Eyes: Negative for photophobia and discharge.  Respiratory: Negative for cough, shortness of breath and wheezing.   Cardiovascular: Negative for chest pain and  palpitations.  Gastrointestinal: Negative for abdominal pain and blood in stool.  Genitourinary: Negative for dysuria, frequency and hematuria.  Musculoskeletal: Negative for back pain and arthralgias.  Skin: Negative.        Abscess  Neurological: Negative for dizziness, seizures and speech difficulty.  Psychiatric/Behavioral: Negative for hallucinations and confusion.    Allergies  Review of patient's allergies indicates no known allergies.  Home Medications  No current outpatient prescriptions on file.  BP 157/75  Pulse 75  Temp(Src) 98.9 F (37.2 C) (Oral)  Resp 18  Ht 5\' 4"  (1.626 m)  Wt 265 lb (120.203 kg)  BMI 45.49 kg/m2  SpO2 99%  Physical Exam  Nursing note and vitals reviewed. Constitutional: She is oriented to person, place, and time. She appears well-developed and well-nourished.  Non-toxic appearance.  HENT:  Head: Normocephalic.  Right Ear: Tympanic membrane and external ear normal.  Left Ear: Tympanic membrane and external ear normal.  Eyes: EOM and lids are normal. Pupils are equal, round, and reactive to light.  Neck: Normal range of motion. Neck supple. Carotid bruit is not present.  Cardiovascular: Normal rate, regular rhythm, normal heart sounds, intact distal pulses and normal pulses.   Pulmonary/Chest: Breath sounds normal. No respiratory distress.       Increase redness and warmth of the left breast. There an abscess just above medial areola.  No drainage from the nipple area. No palpable nodes of the axilla.  Abdominal: Soft. Bowel sounds are normal. There is no tenderness. There is no guarding.  Musculoskeletal: Normal range of motion.  Lymphadenopathy:       Head (right side): No submandibular adenopathy present.       Head (left side): No submandibular adenopathy present.    She has no cervical adenopathy.  Neurological: She is alert and oriented to person, place, and time. She has normal strength. No cranial nerve deficit or sensory deficit.    Skin: Skin is warm and dry.  Psychiatric: She has a normal mood and affect. Her speech is normal.    ED Course: Pt seen with me by Dr Bebe Shaggy. Pt moved to The Acute area for IV antibiotics. Will Discuss with Dr  Leticia Penna for outpt management.  Procedures (including critical care time)  Labs Reviewed - No data to display No results found.   Dx: Cellulitis of the left breast.   MDM  I have reviewed nursing notes, vital signs, and all appropriate lab and imaging results for this patient.    Has been discussed with Dr. Leticia Penna, IV antibiotics given. Dr. Leticia Penna will see patient in the office on tomorrow and begin plan of treatment.      Kathie Dike, [place] 02/23/11 [phone]

## 2011-02-23 NOTE — ED Provider Notes (Signed)
Medical screening examination/treatment/procedure(s) were conducted as a shared visit with non-physician practitioner(s) and myself.  I personally evaluated the patient during the encounter   Joya Gaskins, MD 02/23/11 [phone]

## 2012-01-03 ENCOUNTER — Other Ambulatory Visit (HOSPITAL_COMMUNITY): Payer: Self-pay | Admitting: Internal Medicine

## 2012-01-03 DIAGNOSIS — N63 Unspecified lump in unspecified breast: Secondary | ICD-10-CM

## 2012-01-17 ENCOUNTER — Other Ambulatory Visit (HOSPITAL_COMMUNITY): Payer: Self-pay | Admitting: Internal Medicine

## 2012-01-17 ENCOUNTER — Ambulatory Visit (HOSPITAL_COMMUNITY)
Admission: RE | Admit: 2012-01-17 | Discharge: 2012-01-17 | Disposition: A | Discharge status: Home / Self Care | Payer: Self-pay | Source: Ambulatory Visit | Attending: Internal Medicine | Admitting: Internal Medicine

## 2012-01-17 DIAGNOSIS — N63 Unspecified lump in unspecified breast: Secondary | ICD-10-CM

## 2012-01-29 ENCOUNTER — Other Ambulatory Visit (HOSPITAL_COMMUNITY): Payer: Self-pay | Admitting: Nurse Practitioner

## 2012-01-29 DIAGNOSIS — IMO0002 Reserved for concepts with insufficient information to code with codable children: Secondary | ICD-10-CM

## 2012-02-07 ENCOUNTER — Other Ambulatory Visit (HOSPITAL_COMMUNITY): Payer: Self-pay | Admitting: Nurse Practitioner

## 2012-02-07 ENCOUNTER — Ambulatory Visit (HOSPITAL_COMMUNITY)
Admission: RE | Admit: 2012-02-07 | Discharge: 2012-02-07 | Disposition: A | Discharge status: Home / Self Care | Payer: PRIVATE HEALTH INSURANCE | Source: Ambulatory Visit | Attending: Nurse Practitioner | Admitting: Nurse Practitioner

## 2012-02-07 DIAGNOSIS — IMO0002 Reserved for concepts with insufficient information to code with codable children: Secondary | ICD-10-CM

## 2012-02-07 DIAGNOSIS — N63 Unspecified lump in unspecified breast: Secondary | ICD-10-CM | POA: Insufficient documentation

## 2012-02-07 NOTE — Progress Notes (Signed)
Lidocaine 2%         9mL injected                         Right breast biopsy performed  

## 2013-03-18 ENCOUNTER — Other Ambulatory Visit (HOSPITAL_COMMUNITY): Payer: Self-pay | Admitting: Family Medicine

## 2013-03-18 DIAGNOSIS — Z139 Encounter for screening, unspecified: Secondary | ICD-10-CM

## 2013-03-20 ENCOUNTER — Ambulatory Visit (HOSPITAL_COMMUNITY)
Admission: RE | Admit: 2013-03-20 | Discharge: 2013-03-20 | Disposition: A | Discharge status: Home / Self Care | Payer: Medicare HMO | Source: Ambulatory Visit | Attending: Family Medicine | Admitting: Family Medicine

## 2013-03-20 DIAGNOSIS — Z1231 Encounter for screening mammogram for malignant neoplasm of breast: Secondary | ICD-10-CM | POA: Insufficient documentation

## 2013-03-20 DIAGNOSIS — Z139 Encounter for screening, unspecified: Secondary | ICD-10-CM

## 2013-12-22 ENCOUNTER — Other Ambulatory Visit (HOSPITAL_COMMUNITY): Payer: Self-pay | Admitting: Family Medicine

## 2013-12-22 DIAGNOSIS — M5417 Radiculopathy, lumbosacral region: Secondary | ICD-10-CM

## 2013-12-24 ENCOUNTER — Ambulatory Visit (HOSPITAL_COMMUNITY)
Admission: RE | Admit: 2013-12-24 | Discharge: 2013-12-24 | Disposition: A | Discharge status: Home / Self Care | Payer: Medicare HMO | Source: Ambulatory Visit | Attending: Family Medicine | Admitting: Family Medicine

## 2013-12-24 DIAGNOSIS — M5416 Radiculopathy, lumbar region: Secondary | ICD-10-CM | POA: Insufficient documentation

## 2013-12-24 DIAGNOSIS — M5417 Radiculopathy, lumbosacral region: Secondary | ICD-10-CM

## 2014-02-24 ENCOUNTER — Ambulatory Visit (INDEPENDENT_AMBULATORY_CARE_PROVIDER_SITE_OTHER): Payer: Medicare HMO

## 2014-02-24 DIAGNOSIS — Z5181 Encounter for therapeutic drug level monitoring: Secondary | ICD-10-CM

## 2014-02-24 LAB — POCT INR: INR: 1

## 2014-02-24 NOTE — Progress Notes (Signed)
INR per Dr Nelva Bush pre injection.

## 2014-03-25 DIAGNOSIS — M549 Dorsalgia, unspecified: Secondary | ICD-10-CM | POA: Diagnosis not present

## 2014-03-25 DIAGNOSIS — F329 Major depressive disorder, single episode, unspecified: Secondary | ICD-10-CM | POA: Diagnosis not present

## 2014-03-25 DIAGNOSIS — Z6841 Body Mass Index (BMI) 40.0 and over, adult: Secondary | ICD-10-CM | POA: Diagnosis not present

## 2014-05-13 DIAGNOSIS — G5702 Lesion of sciatic nerve, left lower limb: Secondary | ICD-10-CM | POA: Diagnosis not present

## 2014-05-13 DIAGNOSIS — M4806 Spinal stenosis, lumbar region: Secondary | ICD-10-CM | POA: Diagnosis not present

## 2014-05-17 ENCOUNTER — Emergency Department (HOSPITAL_COMMUNITY)
Admission: EM | Admit: 2014-05-17 | Discharge: 2014-05-17 | Disposition: A | Discharge status: Home / Self Care | Payer: Medicare Other | Attending: Emergency Medicine | Admitting: Emergency Medicine

## 2014-05-17 ENCOUNTER — Encounter (HOSPITAL_COMMUNITY): Payer: Self-pay | Admitting: Emergency Medicine

## 2014-05-17 DIAGNOSIS — I82402 Acute embolism and thrombosis of unspecified deep veins of left lower extremity: Secondary | ICD-10-CM | POA: Diagnosis not present

## 2014-05-17 DIAGNOSIS — M79604 Pain in right leg: Secondary | ICD-10-CM | POA: Insufficient documentation

## 2014-05-17 DIAGNOSIS — I10 Essential (primary) hypertension: Secondary | ICD-10-CM | POA: Diagnosis not present

## 2014-05-17 DIAGNOSIS — Z7901 Long term (current) use of anticoagulants: Secondary | ICD-10-CM | POA: Insufficient documentation

## 2014-05-17 DIAGNOSIS — I82812 Embolism and thrombosis of superficial veins of left lower extremities: Secondary | ICD-10-CM | POA: Diagnosis not present

## 2014-05-17 DIAGNOSIS — Z79899 Other long term (current) drug therapy: Secondary | ICD-10-CM | POA: Diagnosis not present

## 2014-05-17 DIAGNOSIS — Z86711 Personal history of pulmonary embolism: Secondary | ICD-10-CM | POA: Diagnosis not present

## 2014-05-17 DIAGNOSIS — Z720 Tobacco use: Secondary | ICD-10-CM | POA: Insufficient documentation

## 2014-05-17 DIAGNOSIS — F329 Major depressive disorder, single episode, unspecified: Secondary | ICD-10-CM | POA: Diagnosis not present

## 2014-05-17 LAB — CBC WITH DIFFERENTIAL/PLATELET
Basophils Absolute: 0.1 10*3/uL (ref 0.0–0.1)
Basophils Relative: 1 % (ref 0–1)
Eosinophils Absolute: 0.2 10*3/uL (ref 0.0–0.7)
Eosinophils Relative: 2 % (ref 0–5)
HCT: 43 % (ref 36.0–46.0)
Hemoglobin: 13.8 g/dL (ref 12.0–15.0)
Lymphocytes Relative: 31 % (ref 12–46)
Lymphs Abs: 3.4 10*3/uL (ref 0.7–4.0)
MCH: 28.2 pg (ref 26.0–34.0)
MCHC: 32.1 g/dL (ref 30.0–36.0)
MCV: 87.9 fL (ref 78.0–100.0)
Monocytes Absolute: 0.8 10*3/uL (ref 0.1–1.0)
Monocytes Relative: 7 % (ref 3–12)
Neutro Abs: 6.3 10*3/uL (ref 1.7–7.7)
Neutrophils Relative %: 59 % (ref 43–77)
Platelets: 331 10*3/uL (ref 150–400)
RBC: 4.89 MIL/uL (ref 3.87–5.11)
RDW: 13.7 % (ref 11.5–15.5)
WBC: 10.7 10*3/uL — ABNORMAL HIGH (ref 4.0–10.5)

## 2014-05-17 LAB — BASIC METABOLIC PANEL WITH GFR
Anion gap: 8 (ref 5–15)
BUN: 14 mg/dL (ref 6–23)
CO2: 28 mmol/L (ref 19–32)
Calcium: 9.5 mg/dL (ref 8.4–10.5)
Chloride: 103 mmol/L (ref 96–112)
Creatinine, Ser: 1.16 mg/dL — ABNORMAL HIGH (ref 0.50–1.10)
GFR calc Af Amer: 56 mL/min — ABNORMAL LOW
GFR calc non Af Amer: 49 mL/min — ABNORMAL LOW
Glucose, Bld: 95 mg/dL (ref 70–99)
Potassium: 3.3 mmol/L — ABNORMAL LOW (ref 3.5–5.1)
Sodium: 139 mmol/L (ref 135–145)

## 2014-05-17 LAB — PROTIME-INR
INR: 1.04 (ref 0.00–1.49)
Prothrombin Time: 13.7 s (ref 11.6–15.2)

## 2014-05-17 MED ORDER — POTASSIUM CHLORIDE CRYS ER 20 MEQ PO TBCR
40.0000 meq | EXTENDED_RELEASE_TABLET | Freq: Once | ORAL | Status: AC
  Administered 2014-05-17: 40 meq via ORAL
  Filled 2014-05-17: qty 2

## 2014-05-17 MED ORDER — ENOXAPARIN SODIUM 120 MG/0.8ML ~~LOC~~ SOLN
1.0000 mg/kg | Freq: Once | SUBCUTANEOUS | Status: AC
  Administered 2014-05-17: 115 mg via SUBCUTANEOUS
  Filled 2014-05-17: qty 0.8

## 2014-05-17 NOTE — ED Provider Notes (Signed)
CSN: [id number]     Arrival date & time 05/17/14  1441 History  This chart was scribed for Maudry Diego, MD by Peyton Bottoms, ED Scribe. This patient was seen in room [facility] and the patient's care was started at 3:50 PM.   Chief Complaint  Patient presents with  . Leg Pain   Patient is a 64 y.o. female presenting with leg pain. The history is provided by the patient (Pt. complains of left knee pain and edema). No language interpreter was used.  Leg Pain Location:  Knee Time since incident:  1 day Injury: no   Knee location:  L knee Pain details:    Quality:  Aching and shooting   Radiates to:  L leg   Severity:  Moderate   Onset quality:  Gradual   Duration:  1 day   Timing:  Constant   Progression:  Waxing and waning Chronicity:  New Dislocation: no   Foreign body present:  No foreign bodies Associated symptoms: no back pain and no fatigue    HPI Comments: Jennifer [name] is a 64 y.o. female with a PMHx of depression, hypertension, abdominal hysterectomy, DVT and PE, who presents to the Emergency Department complaining of moderate edema, erythema and pain to left knee which radiates down her calf that began yesterday. She reports past hx of PE. She states that her current symptoms are consistent with how she felt when she was diagnosed with PE in the past. 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 She states she currently takes Coumadin 6mg /day.  Past Medical History  Diagnosis Date  . Depression   . Hypertension   . DVT (deep vein thrombosis) in pregnancy   . PE (pulmonary embolism)    Past Surgical History  Procedure Laterality Date  . Tonsillectomy    . Abdominal hysterectomy     History reviewed. No pertinent family history. History  Substance Use Topics  . Smoking status: Current Every Day Smoker -- 1.00 packs/day    Types: Cigarettes  . Smokeless tobacco: Not on file  . Alcohol Use: No   OB History    Gravida  Para Term Preterm AB TAB SAB Ectopic Multiple Living            2     Review of Systems  Constitutional: Negative for appetite change and fatigue.  HENT: Negative for congestion, ear discharge and sinus pressure.   Eyes: Negative for discharge.  Respiratory: Negative for cough.   Cardiovascular: Positive for leg swelling. Negative for chest pain.  Gastrointestinal: Negative for abdominal pain and diarrhea.  Genitourinary: Negative for frequency and hematuria.  Musculoskeletal: Positive for joint swelling and arthralgias. Negative for back pain.  Skin: Negative for rash.  Neurological: Negative for seizures and headaches.  Psychiatric/Behavioral: Negative for hallucinations.   Allergies  Review of patient's allergies indicates no known allergies.  Home Medications   Prior to Admission medications   Medication Sig Start Date End Date Taking? Authorizing Provider  acetaminophen (TYLENOL) 500 MG tablet Take 500 mg by mouth daily as needed. For headache     Historical Provider, MD  amLODipine (NORVASC) 10 MG tablet Take 10 mg by mouth daily.      Historical Provider, MD  ARIPiprazole (ABILIFY) 5 MG tablet Take 2.5 mg by mouth daily.      Historical Provider, MD  diazepam (VALIUM) 10 MG tablet Take 10 mg by mouth 3 (three) times daily  as needed. For anxiety     Historical Provider, MD  FLUoxetine (PROZAC) 40 MG capsule Take 40 mg by mouth daily.      Historical Provider, MD  gabapentin (NEURONTIN) 100 MG capsule Take 100 mg by mouth 4 (four) times daily.      Historical Provider, MD  HYDROcodone-acetaminophen [facility]) 5-325 MG per tablet 1 or 2 po q4h prn pain 02/20/11   Lenox Ahr, PA-C  lisinopril-hydrochlorothiazide (PRINZIDE,ZESTORETIC) 20-25 MG per tablet Take 1 tablet by mouth daily.      Historical Provider, MD  oxyCODONE-acetaminophen (PERCOCET) 10-325 MG per tablet Take 1 tablet by mouth 4 (four) times daily as needed. For pain     Historical Provider, MD  warfarin (COUMADIN)  4 MG tablet Take 8 mg by mouth at bedtime.      Historical Provider, MD   Triage Vitals: BP 118/93 mmHg  Pulse 87  Temp(Src) 98.6 F (37 C) (Oral)  Resp 16  Ht 5\' 4"  (1.626 m)  Wt 250 lb (113.399 kg)  BMI 42.89 kg/m2  SpO2 98%  Physical Exam  Constitutional: She is oriented to person, place, and time. She appears well-developed.  HENT:  Head: Normocephalic.  Eyes: Conjunctivae and EOM are normal. No scleral icterus.  Neck: Neck supple. No thyromegaly present.  Cardiovascular: Normal rate and regular rhythm.  Exam reveals no gallop and no friction rub.   No murmur heard. Pulmonary/Chest: No stridor. She has no wheezes. She has no rales. She exhibits no tenderness.  Abdominal: She exhibits no distension. There is no tenderness. There is no rebound.  Musculoskeletal: Normal range of motion. She exhibits edema and tenderness.  Moderate tenderness to left calf with mild swelling.  Lymphadenopathy:    She has no cervical adenopathy.  Neurological: She is oriented to person, place, and time. She exhibits normal muscle tone. Coordination normal.  Skin: No rash noted. No erythema.  Psychiatric: She has a normal mood and affect. Her behavior is normal.   ED Course  Procedures (including critical care time)  DIAGNOSTIC STUDIES: Oxygen Saturation is 98% on RA, normal by my interpretation.    COORDINATION OF CARE: 3:52 PM- Discussed plans to order diagnostic ultrasound of left lower leg and lab work. Pt advised of plan for treatment and pt agrees.  Labs Review Labs Reviewed - No data to display  Imaging Review No results found.   EKG Interpretation None     MDM   Final diagnoses:  None  pt with possible dvt on coumadin not therapeutic.  Will give lovenox and get [place] tomorrow am.   Give 3mg  extra of coumadin tonight.  Pt to follow up with pcp   The chart was scribed for me under my direct supervision.  I personally performed the history, physical, and medical decision making and  all procedures in the evaluation of this patient.Maudry Diego, MD 05/17/14 [phone]

## 2014-05-17 NOTE — ED Notes (Signed)
PT c/o increased edema, erythema and pain radiates behind her left knee through her calf x1 day. PT reports hx of blood clots.

## 2014-05-17 NOTE — ED Notes (Signed)
C/o left lower leg. Started on yesterday, rates pain 6.  Took tylenol with no relief.  Denies injury but do have a history of blood clots.  In 2008, pt says she had 4 clots in lungs and had about 2012 she had blood clots to left leg.  Pt is currently on 6 mg of Warafin daily.  Scheduled for procedure on March 10 th and pt is suppose to be off coumadin for 5 days.

## 2014-05-18 ENCOUNTER — Other Ambulatory Visit (HOSPITAL_COMMUNITY): Payer: Self-pay | Admitting: Family Medicine

## 2014-05-18 ENCOUNTER — Ambulatory Visit (HOSPITAL_COMMUNITY)
Admit: 2014-05-18 | Discharge: 2014-05-18 | Disposition: A | Discharge status: Home / Self Care | Payer: Medicare Other | Source: Ambulatory Visit | Attending: Emergency Medicine | Admitting: Emergency Medicine

## 2014-05-18 DIAGNOSIS — Z86718 Personal history of other venous thrombosis and embolism: Secondary | ICD-10-CM | POA: Insufficient documentation

## 2014-05-18 DIAGNOSIS — M79605 Pain in left leg: Secondary | ICD-10-CM | POA: Diagnosis not present

## 2014-05-18 DIAGNOSIS — I82812 Embolism and thrombosis of superficial veins of left lower extremities: Secondary | ICD-10-CM | POA: Diagnosis not present

## 2014-05-18 DIAGNOSIS — Z1231 Encounter for screening mammogram for malignant neoplasm of breast: Secondary | ICD-10-CM

## 2014-05-18 DIAGNOSIS — R609 Edema, unspecified: Secondary | ICD-10-CM | POA: Diagnosis not present

## 2014-05-18 MED ORDER — ENOXAPARIN SODIUM 150 MG/ML ~~LOC~~ SOLN: 150.0000 mg | SUBCUTANEOUS | Status: DC

## 2014-05-18 NOTE — ED Provider Notes (Signed)
  Physical Exam  BP 145/65 mmHg  Pulse 70  Temp(Src) 97.5 F (36.4 C) (Oral)  Resp 16  Ht 5\' 4"  (1.626 m)  Wt 250 lb (113.399 kg)  BMI 42.89 kg/m2  SpO2 100%  Physical Exam  ED Course  Procedures  MDM Reviewed outpatient Doppler results. Shows SVT in the left leg. Has had previous clots. Subtherapeutic on Coumadin. Will give Lovenox as she is high risk for both DVT and PE. Will have follow-up with her PCP who is been dosing her Coumadin.      Davonna Belling, MD 05/18/14 [phone]

## 2014-05-18 NOTE — Discharge Instructions (Signed)
Follow with your doctor for management for your superficial clot. Use the lovenox until you are theraputic on coumadin.

## 2014-05-20 DIAGNOSIS — M1000 Idiopathic gout, unspecified site: Secondary | ICD-10-CM | POA: Diagnosis not present

## 2014-05-20 DIAGNOSIS — Z6841 Body Mass Index (BMI) 40.0 and over, adult: Secondary | ICD-10-CM | POA: Diagnosis not present

## 2014-05-20 DIAGNOSIS — Z7901 Long term (current) use of anticoagulants: Secondary | ICD-10-CM | POA: Diagnosis not present

## 2014-05-21 ENCOUNTER — Ambulatory Visit (HOSPITAL_COMMUNITY): Payer: Medicare Other

## 2014-05-25 DIAGNOSIS — Z7901 Long term (current) use of anticoagulants: Secondary | ICD-10-CM | POA: Diagnosis not present

## 2014-06-22 DIAGNOSIS — G5702 Lesion of sciatic nerve, left lower limb: Secondary | ICD-10-CM | POA: Diagnosis not present

## 2014-06-22 DIAGNOSIS — M4806 Spinal stenosis, lumbar region: Secondary | ICD-10-CM | POA: Diagnosis not present

## 2014-07-16 DIAGNOSIS — M4806 Spinal stenosis, lumbar region: Secondary | ICD-10-CM | POA: Diagnosis not present

## 2014-07-16 DIAGNOSIS — Z6841 Body Mass Index (BMI) 40.0 and over, adult: Secondary | ICD-10-CM | POA: Diagnosis not present

## 2014-07-16 DIAGNOSIS — I10 Essential (primary) hypertension: Secondary | ICD-10-CM | POA: Diagnosis not present

## 2014-07-16 DIAGNOSIS — G5702 Lesion of sciatic nerve, left lower limb: Secondary | ICD-10-CM | POA: Diagnosis not present

## 2014-08-10 ENCOUNTER — Other Ambulatory Visit (HOSPITAL_COMMUNITY): Payer: Self-pay | Admitting: Physician Assistant

## 2014-08-10 ENCOUNTER — Ambulatory Visit (HOSPITAL_COMMUNITY)
Admission: RE | Admit: 2014-08-10 | Discharge: 2014-08-10 | Disposition: A | Discharge status: Home / Self Care | Payer: Medicare Other | Source: Ambulatory Visit | Attending: Physician Assistant | Admitting: Physician Assistant

## 2014-08-10 DIAGNOSIS — M7989 Other specified soft tissue disorders: Secondary | ICD-10-CM | POA: Diagnosis not present

## 2014-08-10 DIAGNOSIS — R600 Localized edema: Secondary | ICD-10-CM | POA: Diagnosis not present

## 2014-08-10 DIAGNOSIS — I82412 Acute embolism and thrombosis of left femoral vein: Secondary | ICD-10-CM | POA: Diagnosis not present

## 2014-08-10 DIAGNOSIS — M7122 Synovial cyst of popliteal space [Baker], left knee: Secondary | ICD-10-CM | POA: Insufficient documentation

## 2014-08-10 DIAGNOSIS — G894 Chronic pain syndrome: Secondary | ICD-10-CM | POA: Diagnosis not present

## 2014-08-10 DIAGNOSIS — Z6841 Body Mass Index (BMI) 40.0 and over, adult: Secondary | ICD-10-CM | POA: Diagnosis not present

## 2014-08-10 DIAGNOSIS — M8580 Other specified disorders of bone density and structure, unspecified site: Secondary | ICD-10-CM

## 2014-08-10 DIAGNOSIS — I824Z2 Acute embolism and thrombosis of unspecified deep veins of left distal lower extremity: Secondary | ICD-10-CM | POA: Diagnosis not present

## 2014-08-11 DIAGNOSIS — Z7901 Long term (current) use of anticoagulants: Secondary | ICD-10-CM | POA: Diagnosis not present

## 2014-08-17 ENCOUNTER — Ambulatory Visit (HOSPITAL_COMMUNITY)
Admission: RE | Admit: 2014-08-17 | Discharge: 2014-08-17 | Disposition: A | Discharge status: Home / Self Care | Payer: Medicare Other | Source: Ambulatory Visit | Attending: Physician Assistant | Admitting: Physician Assistant

## 2014-08-17 DIAGNOSIS — M8580 Other specified disorders of bone density and structure, unspecified site: Secondary | ICD-10-CM

## 2014-08-17 DIAGNOSIS — Z780 Asymptomatic menopausal state: Secondary | ICD-10-CM | POA: Diagnosis not present

## 2014-08-17 DIAGNOSIS — Z13820 Encounter for screening for osteoporosis: Secondary | ICD-10-CM | POA: Diagnosis not present

## 2014-08-17 DIAGNOSIS — R29890 Loss of height: Secondary | ICD-10-CM | POA: Diagnosis not present

## 2014-08-17 DIAGNOSIS — Z7901 Long term (current) use of anticoagulants: Secondary | ICD-10-CM | POA: Diagnosis not present

## 2014-09-21 DIAGNOSIS — G5702 Lesion of sciatic nerve, left lower limb: Secondary | ICD-10-CM | POA: Diagnosis not present

## 2014-09-21 DIAGNOSIS — M4806 Spinal stenosis, lumbar region: Secondary | ICD-10-CM | POA: Diagnosis not present

## 2014-10-08 ENCOUNTER — Emergency Department (HOSPITAL_COMMUNITY): Payer: Medicare Other

## 2014-10-08 ENCOUNTER — Emergency Department (HOSPITAL_COMMUNITY)
Admission: EM | Admit: 2014-10-08 | Discharge: 2014-10-08 | Disposition: A | Discharge status: Home / Self Care | Payer: Medicare Other | Attending: Emergency Medicine | Admitting: Emergency Medicine

## 2014-10-08 ENCOUNTER — Encounter (HOSPITAL_COMMUNITY): Payer: Self-pay | Admitting: Emergency Medicine

## 2014-10-08 DIAGNOSIS — Z86711 Personal history of pulmonary embolism: Secondary | ICD-10-CM | POA: Insufficient documentation

## 2014-10-08 DIAGNOSIS — F329 Major depressive disorder, single episode, unspecified: Secondary | ICD-10-CM | POA: Insufficient documentation

## 2014-10-08 DIAGNOSIS — I10 Essential (primary) hypertension: Secondary | ICD-10-CM | POA: Insufficient documentation

## 2014-10-08 DIAGNOSIS — I82432 Acute embolism and thrombosis of left popliteal vein: Secondary | ICD-10-CM | POA: Diagnosis not present

## 2014-10-08 DIAGNOSIS — Z720 Tobacco use: Secondary | ICD-10-CM | POA: Insufficient documentation

## 2014-10-08 DIAGNOSIS — Z7901 Long term (current) use of anticoagulants: Secondary | ICD-10-CM | POA: Diagnosis not present

## 2014-10-08 DIAGNOSIS — I82402 Acute embolism and thrombosis of unspecified deep veins of left lower extremity: Secondary | ICD-10-CM | POA: Diagnosis not present

## 2014-10-08 DIAGNOSIS — Z79899 Other long term (current) drug therapy: Secondary | ICD-10-CM | POA: Diagnosis not present

## 2014-10-08 DIAGNOSIS — I82412 Acute embolism and thrombosis of left femoral vein: Secondary | ICD-10-CM | POA: Diagnosis not present

## 2014-10-08 DIAGNOSIS — M79662 Pain in left lower leg: Secondary | ICD-10-CM | POA: Diagnosis present

## 2014-10-08 DIAGNOSIS — I824Z2 Acute embolism and thrombosis of unspecified deep veins of left distal lower extremity: Secondary | ICD-10-CM | POA: Insufficient documentation

## 2014-10-08 LAB — CBC WITH DIFFERENTIAL/PLATELET
Basophils Absolute: 0 10*3/uL (ref 0.0–0.1)
Basophils Relative: 0 % (ref 0–1)
Eosinophils Absolute: 0.1 10*3/uL (ref 0.0–0.7)
Eosinophils Relative: 1 % (ref 0–5)
HCT: 47.3 % — ABNORMAL HIGH (ref 36.0–46.0)
Hemoglobin: 15.5 g/dL — ABNORMAL HIGH (ref 12.0–15.0)
Lymphocytes Relative: 20 % (ref 12–46)
Lymphs Abs: 2.6 10*3/uL (ref 0.7–4.0)
MCH: 29.4 pg (ref 26.0–34.0)
MCHC: 32.8 g/dL (ref 30.0–36.0)
MCV: 89.6 fL (ref 78.0–100.0)
Monocytes Absolute: 0.9 10*3/uL (ref 0.1–1.0)
Monocytes Relative: 7 % (ref 3–12)
Neutro Abs: 9.1 10*3/uL — ABNORMAL HIGH (ref 1.7–7.7)
Neutrophils Relative %: 72 % (ref 43–77)
Platelets: 280 10*3/uL (ref 150–400)
RBC: 5.28 MIL/uL — ABNORMAL HIGH (ref 3.87–5.11)
RDW: 14.6 % (ref 11.5–15.5)
WBC: 12.7 10*3/uL — ABNORMAL HIGH (ref 4.0–10.5)

## 2014-10-08 LAB — BASIC METABOLIC PANEL
Anion gap: 11 (ref 5–15)
BUN: 17 mg/dL (ref 6–20)
CO2: 27 mmol/L (ref 22–32)
Calcium: 9.3 mg/dL (ref 8.9–10.3)
Chloride: 100 mmol/L — ABNORMAL LOW (ref 101–111)
Creatinine, Ser: 1.11 mg/dL — ABNORMAL HIGH (ref 0.44–1.00)
GFR calc Af Amer: 59 mL/min — ABNORMAL LOW (ref >60)
GFR calc non Af Amer: 51 mL/min — ABNORMAL LOW (ref >60)
Glucose, Bld: 90 mg/dL (ref 65–99)
Potassium: 3.7 mmol/L (ref 3.5–5.1)
Sodium: 138 mmol/L (ref 135–145)

## 2014-10-08 LAB — PROTIME-INR
INR: 1.31 (ref 0.00–1.49)
Prothrombin Time: 16.4 seconds — ABNORMAL HIGH (ref 11.6–15.2)

## 2014-10-08 MED ORDER — HYDROCODONE-ACETAMINOPHEN 5-325 MG PO TABS
2.0000 | ORAL_TABLET | ORAL | Status: DC | PRN
Start: 2014-10-08 — End: 2015-01-08

## 2014-10-08 MED ORDER — RIVAROXABAN 15 MG PO TABS
15.0000 mg | ORAL_TABLET | Freq: Once | ORAL | Status: AC
  Administered 2014-10-08: 15 mg via ORAL
  Filled 2014-10-08 (×2): qty 1

## 2014-10-08 MED ORDER — XARELTO VTE STARTER PACK 15 & 20 MG PO TBPK: 15.0000 mg | ORAL_TABLET | ORAL | Status: DC

## 2014-10-08 NOTE — ED Notes (Signed)
Pt has knot to left lower leg with swelling and redness.

## 2014-10-08 NOTE — Discharge Instructions (Signed)
Stop coumadin. Begin Xarelto. Recheck with your PA/Dr. Hilma Favors.  Deep Vein Thrombosis A deep vein thrombosis (DVT) is a blood clot that develops in the deep, larger veins of the leg, arm, or pelvis. These are more dangerous than clots that might form in veins near the surface of the body. A DVT can lead to serious and even life-threatening complications if the clot breaks off and travels in the bloodstream to the lungs.  A DVT can damage the valves in your leg veins so that instead of flowing upward, the blood pools in the lower leg. This is called post-thrombotic syndrome, and it can result in pain, swelling, discoloration, and sores on the leg. CAUSES Usually, several things contribute to the formation of blood clots. Contributing factors include:  The flow of blood slows down.  The inside of the vein is damaged in some way.  You have a condition that makes blood clot more easily. RISK FACTORS Some people are more likely than others to develop blood clots. Risk factors include:   Smoking.  Being overweight (obese).  Sitting or lying still for a long time. This includes long-distance travel, paralysis, or recovery from an illness or surgery. Other factors that increase risk are:   Older age, especially over 75 years of age.  Having a family history of blood clots or if you have already had a blot clot.  Having major or lengthy surgery. This is especially true for surgery on the hip, knee, or belly (abdomen). Hip surgery is particularly high risk.  Having a long, thin tube (catheter) placed inside a vein during a medical procedure.  Breaking a hip or leg.  Having cancer or cancer treatment.  Pregnancy and childbirth.  Hormone changes make the blood clot more easily during pregnancy.  The fetus puts pressure on the veins of the pelvis.  There is a risk of injury to veins during delivery or a caesarean delivery. The risk is highest just after childbirth.  Medicines  containing the female hormone estrogen. This includes birth control pills and hormone replacement therapy.  Other circulation or heart problems.  SIGNS AND SYMPTOMS When a clot forms, it can either partially or totally block the blood flow in that vein. Symptoms of a DVT can include:  Swelling of the leg or arm, especially if one side is much worse.  Warmth and redness of the leg or arm, especially if one side is much worse.  Pain in an arm or leg. If the clot is in the leg, symptoms may be more noticeable or worse when standing or walking. The symptoms of a DVT that has traveled to the lungs (pulmonary embolism, PE) usually start suddenly and include:  Shortness of breath.  Coughing.  Coughing up blood or blood-tinged mucus.  Chest pain. The chest pain is often worse with deep breaths.  Rapid heartbeat. Anyone with these symptoms should get emergency medical treatment right away. Do not wait to see if the symptoms will go away. Call your local emergency services (911 in the [place] if you have these symptoms. Do not drive yourself to the hospital. DIAGNOSIS If a DVT is suspected, your health care provider will take a full medical history and perform a physical exam. Tests that also may be required include:  Blood tests, including studies of the clotting properties of the blood.  Ultrasound to see if you have clots in your legs or lungs.  X-rays to show the flow of blood when dye is injected into the veins (  venogram).  Studies of your lungs if you have any chest symptoms. PREVENTION  Exercise the legs regularly. Take a brisk 30-minute walk every day.  Maintain a weight that is appropriate for your height.  Avoid sitting or lying in bed for long periods of time without moving your legs.  Women, particularly those over the age of 12 years, should consider the risks and benefits of taking estrogen medicines, including birth control pills.  Do not smoke, especially if you take  estrogen medicines.  Long-distance travel can increase your risk of DVT. You should exercise your legs by walking or pumping the muscles every hour.  Many of the risk factors above relate to situations that exist with hospitalization, either for illness, injury, or elective surgery. Prevention may include medical and nonmedical measures.  Your health care provider will assess you for the need for venous thromboembolism prevention when you are admitted to the hospital. If you are having surgery, your surgeon will assess you the day of or day after surgery. TREATMENT Once identified, a DVT can be treated. It can also be prevented in some circumstances. Once you have had a DVT, you may be at increased risk for a DVT in the future. The most common treatment for DVT is blood-thinning (anticoagulant) medicine, which reduces the blood's tendency to clot. Anticoagulants can stop new blood clots from forming and stop old clots from growing. They cannot dissolve existing clots. Your body does this by itself over time. Anticoagulants can be given by mouth, through an IV tube, or by injection. Your health care provider will determine the best program for you. Other medicines or treatments that may be used are:  Heparin or related medicines (low molecular weight heparin) are often the first treatment for a blood clot. They act quickly. However, they cannot be taken orally and must be given either in shot form or by IV tube.  Heparin can cause a fall in a component of blood that stops bleeding and forms blood clots (platelets). You will be monitored with blood tests to be sure this does not occur.  Warfarin is an anticoagulant that can be swallowed. It takes a few days to start working, so usually heparin or related medicines are used in combination. Once warfarin is working, heparin is usually stopped.  Factor Xa inhibitor medicines, such as rivaroxaban and apixaban, also reduce blood clotting. These medicines  are taken orally and can often be used without heparin or related medicines.  Less commonly, clot dissolving drugs (thrombolytics) are used to dissolve a DVT. They carry a high risk of bleeding, so they are used mainly in severe cases where your life or a part of your body is threatened.  Very rarely, a blood clot in the leg needs to be removed surgically.  If you are unable to take anticoagulants, your health care provider may arrange for you to have a filter placed in a main vein in your abdomen. This filter prevents clots from traveling to your lungs. HOME CARE INSTRUCTIONS  Take all medicines as directed by your health care provider.  Learn as much as you can about DVT.  Wear a medical alert bracelet or carry a medical alert card.  Ask your health care provider how soon you can go back to normal activities. It is important to stay active to prevent blood clots. If you are on anticoagulant medicine, avoid contact sports.  It is very important to exercise. This is especially important while traveling, sitting, or standing for long  periods of time. Exercise your legs by walking or by tightening and relaxing your leg muscles regularly. Take frequent walks.  You may need to wear compression stockings. These are tight elastic stockings that apply pressure to the lower legs. This pressure can help keep the blood in the legs from clotting. Taking Warfarin Warfarin is a daily medicine that is taken by mouth. Your health care provider will advise you on the length of treatment (usually 3-6 months, sometimes lifelong). If you take warfarin:  Understand how to take warfarin and foods that can affect how warfarin works in Veterinary surgeon.  Too much and too little warfarin are both dangerous. Too much warfarin increases the risk of bleeding. Too little warfarin continues to allow the risk for blood clots. Warfarin and Regular Blood Testing While taking warfarin, you will need to have regular blood tests  to measure your blood clotting time. These blood tests usually include both the prothrombin time (PT) and international normalized ratio (INR) tests. The PT and INR results allow your health care provider to adjust your dose of warfarin. It is very important that you have your PT and INR tested as often as directed by your health care provider.  Warfarin and Your Diet Avoid major changes in your diet, or notify your health care provider before changing your diet. Arrange a visit with a registered dietitian to answer your questions. Many foods, especially foods high in vitamin K, can interfere with warfarin and affect the PT and INR results. You should eat a consistent amount of foods high in vitamin K. Foods high in vitamin K include:   Spinach, kale, broccoli, cabbage, collard and turnip greens, Brussels sprouts, peas, cauliflower, seaweed, and parsley.  Beef and pork liver.  Green tea.  Soybean oil. Warfarin with Other Medicines Many medicines can interfere with warfarin and affect the PT and INR results. You must:  Tell your health care provider about any and all medicines, vitamins, and supplements you take, including aspirin and other over-the-counter anti-inflammatory medicines. Be especially cautious with aspirin and anti-inflammatory medicines. Ask your health care provider before taking these.  Do not take or discontinue any prescribed or over-the-counter medicine except on the advice of your health care provider or pharmacist. Warfarin Side Effects Warfarin can have side effects, such as easy bruising and difficulty stopping bleeding. Ask your health care provider or pharmacist about other side effects of warfarin. You will need to:  Hold pressure over cuts for longer than usual.  Notify your dentist and other health care providers that you are taking warfarin before you undergo any procedures where bleeding may occur. Warfarin with Alcohol and Tobacco   Drinking alcohol  frequently can increase the effect of warfarin, leading to excess bleeding. It is best to avoid alcoholic drinks or to consume only very small amounts while taking warfarin. Notify your health care provider if you change your alcohol intake.   Do not use any tobacco products including cigarettes, chewing tobacco, or electronic cigarettes. If you smoke, quit. Ask your health care provider for help with quitting smoking. Alternative Medicines to Warfarin: Factor Xa Inhibitor Medicines  These blood-thinning medicines are taken by mouth, usually for several weeks or longer. It is important to take the medicine every single day at the same time each day.  There are no regular blood tests required when using these medicines.  There are fewer food and drug interactions than with warfarin.  The side effects of this class of medicine are similar to  those of warfarin, including excessive bruising or bleeding. Ask your health care provider or pharmacist about other potential side effects. SEEK MEDICAL CARE IF:  You notice a rapid heartbeat.  You feel weaker or more tired than usual.  You feel faint.  You notice increased bruising.  You feel your symptoms are not getting better in the time expected.  You believe you are having side effects of medicine. SEEK IMMEDIATE MEDICAL CARE IF:  You have chest pain.  You have trouble breathing.  You have new or increased swelling or pain in one leg.  You cough up blood.  You notice blood in vomit, in a bowel movement, or in urine. MAKE SURE YOU:  Understand these instructions.  Will watch your condition.  Will get help right away if you are not doing well or get worse. Document Released: 02/27/2005 Document Revised: 07/14/2013 Document Reviewed: 11/04/2012 [facility] Patient Information 2015 [place], [place]. This information is not intended to replace advice given to you by your health care provider. Make sure you discuss any questions you have  with your health care provider.

## 2014-10-08 NOTE — ED Notes (Signed)
Pain to left leg since yesterday.  Pt has history of blood clot and currently on Coumadin 7 mg daily.  Last INR per pt was 2.4.  Rates pain 8/10.

## 2014-10-08 NOTE — ED Provider Notes (Signed)
CSN: [id number]     Arrival date & time 10/08/14  1051 History  This chart was scribed for Jennifer Furry, MD by Randa Evens, ED Scribe. This patient was seen in room APA18/APA18 and the patient's care was started at 11:02 AM.     Chief Complaint  Patient presents with  . Leg Pain    history of blood clot.     The history is provided by the patient. No language interpreter was used.   HPI Comments: Jennifer [name] is a 64 y.o. female who presents to the Emergency Department complaining of lower left leg pain onset 1 day prior. Pt present with some associated swelling and redness. 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 Pt states that she is currently on Coumadin daily. She states that her last coumadin level was 2.4. Pt does report that she was recently taken off her coumadin but placed back on 12 days ago. Pt reports recent DVT in left leg in May 2016. Pt denies fever, chills,  CP or SOB.    Past Medical History  Diagnosis Date  . Depression   . Hypertension   . DVT (deep vein thrombosis) in pregnancy   . PE (pulmonary embolism)    Past Surgical History  Procedure Laterality Date  . Tonsillectomy    . Abdominal hysterectomy     History reviewed. No pertinent family history. History  Substance Use Topics  . Smoking status: Current Every Day Smoker -- 1.00 packs/day    Types: Cigarettes  . Smokeless tobacco: Not on file  . Alcohol Use: No   OB History    Gravida Para Term Preterm AB TAB SAB Ectopic Multiple Living            2     Review of Systems  Constitutional: Negative for fever, chills, diaphoresis, appetite change and fatigue.  HENT: Negative for mouth sores, sore throat and trouble swallowing.   Eyes: Negative for visual disturbance.  Respiratory: Negative for cough, chest tightness, shortness of breath and wheezing.   Cardiovascular: Negative for chest pain.  Gastrointestinal: Negative for nausea, vomiting, abdominal pain, diarrhea and abdominal distention.  Endocrine:  Negative for polydipsia, polyphagia and polyuria.  Genitourinary: Negative for dysuria, frequency and hematuria.  Musculoskeletal: Positive for myalgias. Negative for gait problem.  Skin: Positive for color change. Negative for pallor and rash.  Neurological: Negative for dizziness, syncope, light-headedness and headaches.  Hematological: Does not bruise/bleed easily.  Psychiatric/Behavioral: Negative for behavioral problems and confusion.      Allergies  Review of patient's allergies indicates no known allergies.  Home Medications   Prior to Admission medications   Medication Sig Start Date End Date Taking? Authorizing Provider  acetaminophen (TYLENOL) 500 MG tablet Take 1,000 mg by mouth daily as needed for mild pain or headache.    Yes Historical Provider, MD  amLODipine (NORVASC) 10 MG tablet Take 10 mg by mouth daily.     Yes Historical Provider, MD  gabapentin (NEURONTIN) 600 MG tablet Take 600 mg by mouth at bedtime.   Yes Historical Provider, MD  lisinopril-hydrochlorothiazide (PRINZIDE,ZESTORETIC) 20-25 MG per tablet Take 1 tablet by mouth daily.     Yes Historical Provider, MD  oxyCODONE-acetaminophen (PERCOCET) 10-325 MG per tablet Take 1 tablet by mouth every 4 (four) hours as needed for pain.   Yes Historical Provider, MD  simvastatin (ZOCOR) 20 MG tablet Take 20 mg by mouth every evening.   Yes Historical Provider, MD  Vortioxetine HBr 10 MG TABS  Take 10 mg by mouth daily.   Yes Historical Provider, MD  warfarin (COUMADIN) 1 MG tablet Take 1 mg by mouth daily. Takes along with 6 mg to=7 mg daily   Yes Historical Provider, MD  warfarin (COUMADIN) 6 MG tablet Take 6 mg by mouth every evening. Takes with 1 mg tablet to = 7 mg   Yes Historical Provider, MD  HYDROcodone-acetaminophen (NORCO/VICODIN) 5-325 MG per tablet Take 2 tablets by mouth every 4 (four) hours as needed. 10/08/14   Jennifer Furry, MD  XARELTO STARTER PACK 15 & 20 MG TBPK Take 15-20 mg by mouth as directed. Take as  directed on package: Start with one 15mg  tablet by mouth twice a day with food. On Day 22, switch to one 20mg  tablet once a day with food. 10/08/14   Jennifer Furry, MD   BP 185/67 mmHg  Pulse 76  Temp(Src) 98.5 F (36.9 C) (Oral)  Resp 18  Ht 5\' 5"  (1.651 m)  Wt 265 lb (120.203 kg)  BMI 44.10 kg/m2  SpO2 100%   Physical Exam  Constitutional: She is oriented to person, place, and time. She appears well-developed and well-nourished. No distress.  HENT:  Head: Normocephalic.  Eyes: Conjunctivae are normal. Pupils are equal, round, and reactive to light. No scleral icterus.  Neck: Normal range of motion. Neck supple. No thyromegaly present.  Cardiovascular: Normal rate and regular rhythm.  Exam reveals no gallop and no friction rub.   No murmur heard. Pulmonary/Chest: Effort normal and breath sounds normal. No respiratory distress. She has no wheezes. She has no rales.  Abdominal: Soft. Bowel sounds are normal. She exhibits no distension. There is no tenderness. There is no rebound.  Musculoskeletal: Normal range of motion.  Erythematous on distal third of left lower leg. circumference of left lower leg 2 cm larger than right  Neurological: She is alert and oriented to person, place, and time.  Skin: Skin is warm and dry. No rash noted.  Psychiatric: She has a normal mood and affect. Her behavior is normal.  Nursing note and vitals reviewed.   ED Course  Procedures (including critical care time) DIAGNOSTIC STUDIES: Oxygen Saturation is 100% on RA, normal by my interpretation.    COORDINATION OF CARE: 11:23 AM-Discussed treatment plan with pt at bedside and pt agreed to plan.     Labs Review Labs Reviewed  CBC WITH DIFFERENTIAL/PLATELET - Abnormal; Notable for the following:    WBC 12.7 (*)    RBC 5.28 (*)    Hemoglobin 15.5 (*)    HCT 47.3 (*)    Neutro Abs 9.1 (*)    All other components within normal limits  PROTIME-INR - Abnormal; Notable for the following:    Prothrombin  Time 16.4 (*)    All other components within normal limits  BASIC METABOLIC PANEL    Imaging Review US Venous Img Lower Unilateral Left  10/08/2014   CLINICAL DATA:  64 year old female with a history of left lower extremity swelling for 1 day.  The patient has a history of DVT on the left documented Aug 10, 2014 on prior duplex ultrasound.  EXAM: LEFT LOWER EXTREMITY VENOUS DOPPLER ULTRASOUND  TECHNIQUE: Gray-scale sonography with graded compression, as well as color Doppler and duplex ultrasound were performed to evaluate the lower extremity deep venous systems from the level of the common femoral vein and including the common femoral, femoral, profunda femoral, popliteal and calf veins including the posterior tibial, peroneal and gastrocnemius veins when visible. The superficial  great saphenous vein was also interrogated. Spectral Doppler was utilized to evaluate flow at rest and with distal augmentation maneuvers in the common femoral, femoral and popliteal veins.  COMPARISON:  Aug 10, 2014  FINDINGS: Contralateral Common Femoral Vein: Respiratory phasicity is normal and symmetric with the symptomatic side. No evidence of thrombus. Normal compressibility.  Common Femoral Vein: No evidence of thrombus with compressible common femoral vein. Maintained flow. Respiratory phasicity is maintained.  Saphenofemoral Junction: No evidence of thrombus. Normal compressibility and flow on color Doppler imaging.  Profunda Femoral Vein: No evidence of thrombus. Normal compressibility and flow on color Doppler imaging.  Femoral Vein: Partial re- cannulization of the previous occlusive thrombus of the left femoral vein, which maintains some flow on today's study. Incompletely compressible.  Popliteal Vein: Partial re- cannulization of the popliteal vein, which was previously thrombosed. Incompletely compressible.  Calf Veins: Persistent thrombus of the peroneal vein and the posterior tibial vein.  Superficial Great  Saphenous Vein: No evidence of thrombus. Normal compressibility and flow on color Doppler imaging.  Other Findings:  None.  IMPRESSION: There is persistent nonocclusive and partially recannulized DVT on the left, involving the femoral vein and popliteal vein, as compared to the prior duplex of 08/10/2014. Thrombus does not appear to extend into the common femoral vein.  Signed,  Dulcy Fanny. Earleen Newport, DO  [facility]  [facility]   Electronically Signed   By: Corrie Mckusick D.O.   On: 10/08/2014 13:38     EKG Interpretation None      MDM   Final diagnoses:  DVT (deep venous thrombosis), left    Patient has continued left lower extremity DVT. All branches show recannulization versus Doppler obtained May 30. In review of her chart it looks like her INR has been somewhat fickle this year even. I think she is very likely had improved her circulation versus her May ultrasound. Then likely became more symptomatic during the nadir of her recent hiatus from her coumadin for her spinal injection. She restarted her Coumadin on the same day she discontinued her Lovenox. Now 10 days later she is still not therapeutic.  She is tolerating her symptoms fairly well. She is not having chest pain shortness of breath. She is not tachycardic or hypoxemic. I doubt PE. I discussed her with Dr. Hilma Favors at her primary care physician's office. He was agreeable to discontinuing Coumadin, and starting the patient on Xarelto. Given a dose here. Carefully instructed to discontinue her Coumadin. Dr. Hilma Favors stated that they have some samples in her office and can help the patient get started encouraged her to call for a follow-up appointment. We discussed complications and potential side effects of Xarelto including difficult control bleeding, spontaneous bleeding. Given similar precautions for her for her Coumadin therapy.    I personally performed the services described in this  documentation, which was scribed in my presence. The recorded information has been reviewed and is accurate.      Jennifer Furry, MD 10/08/14 1426

## 2014-10-20 DIAGNOSIS — G5702 Lesion of sciatic nerve, left lower limb: Secondary | ICD-10-CM | POA: Diagnosis not present

## 2014-10-20 DIAGNOSIS — Z6841 Body Mass Index (BMI) 40.0 and over, adult: Secondary | ICD-10-CM | POA: Diagnosis not present

## 2014-10-20 DIAGNOSIS — M4806 Spinal stenosis, lumbar region: Secondary | ICD-10-CM | POA: Diagnosis not present

## 2014-10-26 DIAGNOSIS — G894 Chronic pain syndrome: Secondary | ICD-10-CM | POA: Diagnosis not present

## 2014-10-26 DIAGNOSIS — Z6841 Body Mass Index (BMI) 40.0 and over, adult: Secondary | ICD-10-CM | POA: Diagnosis not present

## 2014-10-26 DIAGNOSIS — I509 Heart failure, unspecified: Secondary | ICD-10-CM | POA: Diagnosis not present

## 2014-10-26 DIAGNOSIS — Z1389 Encounter for screening for other disorder: Secondary | ICD-10-CM | POA: Diagnosis not present

## 2014-10-26 DIAGNOSIS — R5383 Other fatigue: Secondary | ICD-10-CM | POA: Diagnosis not present

## 2014-11-17 DIAGNOSIS — R262 Difficulty in walking, not elsewhere classified: Secondary | ICD-10-CM | POA: Diagnosis not present

## 2014-11-17 DIAGNOSIS — R293 Abnormal posture: Secondary | ICD-10-CM | POA: Diagnosis not present

## 2014-11-17 DIAGNOSIS — G5700 Lesion of sciatic nerve, unspecified lower limb: Secondary | ICD-10-CM | POA: Diagnosis not present

## 2014-11-17 DIAGNOSIS — M545 Low back pain: Secondary | ICD-10-CM | POA: Diagnosis not present

## 2014-11-30 DIAGNOSIS — R030 Elevated blood-pressure reading, without diagnosis of hypertension: Secondary | ICD-10-CM | POA: Diagnosis not present

## 2014-11-30 DIAGNOSIS — I10 Essential (primary) hypertension: Secondary | ICD-10-CM | POA: Diagnosis not present

## 2014-11-30 DIAGNOSIS — F419 Anxiety disorder, unspecified: Secondary | ICD-10-CM | POA: Diagnosis not present

## 2014-11-30 DIAGNOSIS — Z1389 Encounter for screening for other disorder: Secondary | ICD-10-CM | POA: Diagnosis not present

## 2015-01-06 ENCOUNTER — Emergency Department (HOSPITAL_COMMUNITY): Payer: Medicare Other

## 2015-01-06 ENCOUNTER — Observation Stay (HOSPITAL_COMMUNITY)
Admission: EM | Admit: 2015-01-06 | Discharge: 2015-01-08 | Disposition: A | Discharge status: Home / Self Care | Payer: Medicare Other | Attending: Internal Medicine | Admitting: Internal Medicine

## 2015-01-06 ENCOUNTER — Encounter (HOSPITAL_COMMUNITY): Payer: Self-pay | Admitting: Emergency Medicine

## 2015-01-06 DIAGNOSIS — Z8249 Family history of ischemic heart disease and other diseases of the circulatory system: Secondary | ICD-10-CM | POA: Insufficient documentation

## 2015-01-06 DIAGNOSIS — I2510 Atherosclerotic heart disease of native coronary artery without angina pectoris: Secondary | ICD-10-CM

## 2015-01-06 DIAGNOSIS — Z79899 Other long term (current) drug therapy: Secondary | ICD-10-CM | POA: Diagnosis not present

## 2015-01-06 DIAGNOSIS — D72829 Elevated white blood cell count, unspecified: Secondary | ICD-10-CM | POA: Diagnosis present

## 2015-01-06 DIAGNOSIS — F17210 Nicotine dependence, cigarettes, uncomplicated: Secondary | ICD-10-CM | POA: Insufficient documentation

## 2015-01-06 DIAGNOSIS — F331 Major depressive disorder, recurrent, moderate: Secondary | ICD-10-CM | POA: Diagnosis not present

## 2015-01-06 DIAGNOSIS — R0602 Shortness of breath: Secondary | ICD-10-CM | POA: Diagnosis not present

## 2015-01-06 DIAGNOSIS — Z1389 Encounter for screening for other disorder: Secondary | ICD-10-CM | POA: Diagnosis not present

## 2015-01-06 DIAGNOSIS — Z86718 Personal history of other venous thrombosis and embolism: Secondary | ICD-10-CM | POA: Diagnosis not present

## 2015-01-06 DIAGNOSIS — E785 Hyperlipidemia, unspecified: Secondary | ICD-10-CM | POA: Diagnosis not present

## 2015-01-06 DIAGNOSIS — I8010 Phlebitis and thrombophlebitis of unspecified femoral vein: Secondary | ICD-10-CM

## 2015-01-06 DIAGNOSIS — E782 Mixed hyperlipidemia: Secondary | ICD-10-CM | POA: Diagnosis not present

## 2015-01-06 DIAGNOSIS — F17200 Nicotine dependence, unspecified, uncomplicated: Secondary | ICD-10-CM | POA: Diagnosis present

## 2015-01-06 DIAGNOSIS — Z7901 Long term (current) use of anticoagulants: Secondary | ICD-10-CM | POA: Diagnosis not present

## 2015-01-06 DIAGNOSIS — F329 Major depressive disorder, single episode, unspecified: Secondary | ICD-10-CM | POA: Insufficient documentation

## 2015-01-06 DIAGNOSIS — I10 Essential (primary) hypertension: Secondary | ICD-10-CM | POA: Diagnosis present

## 2015-01-06 DIAGNOSIS — Z79891 Long term (current) use of opiate analgesic: Secondary | ICD-10-CM | POA: Insufficient documentation

## 2015-01-06 DIAGNOSIS — Z86711 Personal history of pulmonary embolism: Secondary | ICD-10-CM | POA: Diagnosis present

## 2015-01-06 DIAGNOSIS — Z6841 Body Mass Index (BMI) 40.0 and over, adult: Secondary | ICD-10-CM | POA: Insufficient documentation

## 2015-01-06 DIAGNOSIS — R079 Chest pain, unspecified: Principal | ICD-10-CM | POA: Diagnosis present

## 2015-01-06 DIAGNOSIS — I209 Angina pectoris, unspecified: Secondary | ICD-10-CM | POA: Diagnosis not present

## 2015-01-06 DIAGNOSIS — R0789 Other chest pain: Secondary | ICD-10-CM | POA: Diagnosis not present

## 2015-01-06 HISTORY — DX: Atherosclerotic heart disease of native coronary artery without angina pectoris: I25.10

## 2015-01-06 HISTORY — DX: Tobacco use: Z72.0

## 2015-01-06 HISTORY — DX: Acute embolism and thrombosis of unspecified deep veins of unspecified lower extremity: I82.409

## 2015-01-06 HISTORY — DX: Hyperlipidemia, unspecified: E78.5

## 2015-01-06 LAB — CBC WITH DIFFERENTIAL/PLATELET
Basophils Absolute: 0 10*3/uL (ref 0.0–0.1)
Basophils Relative: 0 %
Eosinophils Absolute: 0.2 10*3/uL (ref 0.0–0.7)
Eosinophils Relative: 2 %
HCT: 49.7 % — ABNORMAL HIGH (ref 36.0–46.0)
Hemoglobin: 16.5 g/dL — ABNORMAL HIGH (ref 12.0–15.0)
Lymphocytes Relative: 30 %
Lymphs Abs: 3.3 10*3/uL (ref 0.7–4.0)
MCH: 30.1 pg (ref 26.0–34.0)
MCHC: 33.2 g/dL (ref 30.0–36.0)
MCV: 90.5 fL (ref 78.0–100.0)
Monocytes Absolute: 0.7 10*3/uL (ref 0.1–1.0)
Monocytes Relative: 6 %
Neutro Abs: 7 10*3/uL (ref 1.7–7.7)
Neutrophils Relative %: 62 %
Platelets: 307 10*3/uL (ref 150–400)
RBC: 5.49 MIL/uL — ABNORMAL HIGH (ref 3.87–5.11)
RDW: 13.5 % (ref 11.5–15.5)
WBC: 11.1 10*3/uL — ABNORMAL HIGH (ref 4.0–10.5)

## 2015-01-06 LAB — COMPREHENSIVE METABOLIC PANEL WITH GFR
ALT: 16 U/L (ref 14–54)
AST: 24 U/L (ref 15–41)
Albumin: 4.4 g/dL (ref 3.5–5.0)
Alkaline Phosphatase: 75 U/L (ref 38–126)
Anion gap: 12 (ref 5–15)
BUN: 10 mg/dL (ref 6–20)
CO2: 27 mmol/L (ref 22–32)
Calcium: 9.8 mg/dL (ref 8.9–10.3)
Chloride: 101 mmol/L (ref 101–111)
Creatinine, Ser: 1.07 mg/dL — ABNORMAL HIGH (ref 0.44–1.00)
GFR calc Af Amer: >60 mL/min
GFR calc non Af Amer: 53 mL/min — ABNORMAL LOW
Glucose, Bld: 111 mg/dL — ABNORMAL HIGH (ref 65–99)
Potassium: 3.6 mmol/L (ref 3.5–5.1)
Sodium: 140 mmol/L (ref 135–145)
Total Bilirubin: 0.4 mg/dL (ref 0.3–1.2)
Total Protein: 7.7 g/dL (ref 6.5–8.1)

## 2015-01-06 LAB — TROPONIN I
Troponin I: <0.03 ng/mL (ref <0.031)
Troponin I: <0.03 ng/mL (ref <0.031)

## 2015-01-06 LAB — BRAIN NATRIURETIC PEPTIDE: B Natriuretic Peptide: 19 pg/mL (ref 0.0–100.0)

## 2015-01-06 MED ORDER — ACETAMINOPHEN 325 MG PO TABS: 650.0000 mg | ORAL_TABLET | Freq: Four times a day (QID) | ORAL | Status: DC | PRN

## 2015-01-06 MED ORDER — RIVAROXABAN 20 MG PO TABS
20.0000 mg | ORAL_TABLET | Freq: Every day | ORAL | Status: DC
Start: 2015-01-07 — End: 2015-01-07

## 2015-01-06 MED ORDER — ONDANSETRON HCL 4 MG/2ML IJ SOLN: 4.0000 mg | Freq: Four times a day (QID) | INTRAMUSCULAR | Status: DC | PRN

## 2015-01-06 MED ORDER — ACETAMINOPHEN 650 MG RE SUPP: 650.0000 mg | Freq: Four times a day (QID) | RECTAL | Status: DC | PRN

## 2015-01-06 MED ORDER — ASPIRIN EC 325 MG PO TBEC
325.0000 mg | DELAYED_RELEASE_TABLET | Freq: Every day | ORAL | Status: DC
  Administered 2015-01-06 – 2015-01-07 (×2): 325 mg via ORAL
  Filled 2015-01-06 (×2): qty 1

## 2015-01-06 MED ORDER — SODIUM CHLORIDE 0.9 % IJ SOLN: 3.0000 mL | Freq: Two times a day (BID) | INTRAMUSCULAR | Status: DC

## 2015-01-06 MED ORDER — SODIUM CHLORIDE 0.9 % IJ SOLN: 3.0000 mL | INTRAMUSCULAR | Status: DC | PRN

## 2015-01-06 MED ORDER — ONDANSETRON HCL 4 MG PO TABS: 4.0000 mg | ORAL_TABLET | Freq: Four times a day (QID) | ORAL | Status: DC | PRN

## 2015-01-06 MED ORDER — ALLOPURINOL 100 MG PO TABS
100.0000 mg | ORAL_TABLET | Freq: Every day | ORAL | Status: DC
Start: 2015-01-07 — End: 2015-01-08
  Administered 2015-01-07: 100 mg via ORAL
  Filled 2015-01-06: qty 1

## 2015-01-06 MED ORDER — NICOTINE 14 MG/24HR TD PT24
14.0000 mg | MEDICATED_PATCH | Freq: Every day | TRANSDERMAL | Status: DC
  Administered 2015-01-06 – 2015-01-07 (×2): 14 mg via TRANSDERMAL
  Filled 2015-01-06 (×2): qty 1

## 2015-01-06 MED ORDER — GABAPENTIN 300 MG PO CAPS
ORAL_CAPSULE | ORAL | Status: AC
  Filled 2015-01-06: qty 2

## 2015-01-06 MED ORDER — DIAZEPAM 2 MG PO TABS: 2.0000 mg | ORAL_TABLET | Freq: Three times a day (TID) | ORAL | Status: DC | PRN

## 2015-01-06 MED ORDER — GABAPENTIN 600 MG PO TABS
600.0000 mg | ORAL_TABLET | Freq: Every day | ORAL | Status: DC
  Administered 2015-01-06: 600 mg via ORAL
  Filled 2015-01-06 (×2): qty 1

## 2015-01-06 MED ORDER — IOHEXOL 350 MG/ML SOLN
100.0000 mL | Freq: Once | INTRAVENOUS | Status: AC | PRN
  Administered 2015-01-06: 100 mL via INTRAVENOUS

## 2015-01-06 MED ORDER — SODIUM CHLORIDE 0.9 % IV SOLN
INTRAVENOUS | Status: AC
  Administered 2015-01-06: 20:00:00 via INTRAVENOUS

## 2015-01-06 MED ORDER — SIMVASTATIN 20 MG PO TABS
20.0000 mg | ORAL_TABLET | Freq: Every evening | ORAL | Status: DC
  Administered 2015-01-06 – 2015-01-07 (×2): 20 mg via ORAL
  Filled 2015-01-06 (×2): qty 1

## 2015-01-06 MED ORDER — HYDROMORPHONE HCL 1 MG/ML IJ SOLN: 0.5000 mg | INTRAMUSCULAR | Status: DC | PRN

## 2015-01-06 MED ORDER — NITROGLYCERIN 2 % TD OINT
0.5000 [in_us] | TOPICAL_OINTMENT | Freq: Four times a day (QID) | TRANSDERMAL | Status: DC
  Administered 2015-01-06 – 2015-01-08 (×6): 0.5 [in_us] via TOPICAL
  Filled 2015-01-06 (×2): qty 1
  Filled 2015-01-06: qty 30
  Filled 2015-01-06 (×2): qty 1

## 2015-01-06 MED ORDER — ALUM & MAG HYDROXIDE-SIMETH 200-200-20 MG/5ML PO SUSP: 30.0000 mL | Freq: Four times a day (QID) | ORAL | Status: DC | PRN

## 2015-01-06 MED ORDER — OXYCODONE HCL 5 MG PO TABS
10.0000 mg | ORAL_TABLET | Freq: Four times a day (QID) | ORAL | Status: DC | PRN
  Administered 2015-01-07 – 2015-01-08 (×3): 10 mg via ORAL
  Filled 2015-01-06 (×3): qty 2

## 2015-01-06 MED ORDER — SODIUM CHLORIDE 0.9 % IV SOLN: 250.0000 mL | INTRAVENOUS | Status: DC | PRN

## 2015-01-06 NOTE — ED Notes (Signed)
Pt states that she went to her doctor today for a medication refill and checkup and expressed to her doctor that she had a few episodes over the past few weeks with chest pain.  PCP sent here for further evaluation and testing.  Pt denies any complaints at this time.

## 2015-01-06 NOTE — H&P (Addendum)
[facility] Admission History and Physical       Jennifer [name] MIW:[id number] DOB: [birth year] DOA: 01/06/2015  Referring physician: EDP PCP: Jana Half  Specialists:   Chief Complaint: Chest Pain  HPI: Jennifer [name] is a 64 y.o. 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   She rates her pain at a 6/10 at the worst, and describes the pain as heaviness.  The pain is worse with exertion.  She is a smoker, and has a strong family history of Heart Disease.   Her initial ED Evaluation has been negative, and she was referred for further workup.      Review of Systems:  Constitutional: No Weight Loss, No Weight Gain, Night Sweats, Fevers, Chills, Dizziness, Light Headedness, Fatigue, or Generalized Weakness HEENT: No Headaches, Difficulty Swallowing,Tooth/Dental Problems,Sore Throat,  No Sneezing, Rhinitis, Ear Ache, Nasal Congestion, or Post Nasal Drip,  Cardio-vascular:   +Chest pain, Orthopnea, PND, Edema in Lower Extremities, Anasarca, Dizziness, Palpitations  Resp:  +Dyspnea, No DOE, No Productive Cough, No Non-Productive Cough, No Hemoptysis, No Wheezing.    GI: No Heartburn, Indigestion, Abdominal Pain,+Nausea, Vomiting, Diarrhea, Constipation, Hematemesis, Hematochezia, Melena, Change in Bowel Habits,  Loss of Appetite  GU: No Dysuria, No Change in Color of Urine, No Urgency or Urinary Frequency, No Flank pain.  Musculoskeletal: No Joint Pain or Swelling, No Decreased Range of Motion, No Back Pain.  Neurologic: No Syncope, No Seizures, Muscle Weakness, Paresthesia, Vision Disturbance or Loss, No Diplopia, No Vertigo, No Difficulty Walking,  Skin: No Rash or Lesions. Psych: No Change in Mood or Affect, No Depression or Anxiety, No Memory loss, No Confusion, or  Hallucinations   Past Medical History  Diagnosis Date  . Depression   . Hypertension   . DVT (deep vein thrombosis) in pregnancy   . PE (pulmonary embolism)      Past Surgical History  Procedure Laterality Date  . Tonsillectomy    . Abdominal hysterectomy        Prior to Admission medications   Medication Sig Start Date End Date Taking? Authorizing Provider  allopurinol (ZYLOPRIM) 100 MG tablet Take 100 mg by mouth daily.   Yes Historical Provider, MD  diazepam (VALIUM) 2 MG tablet Take 2 mg by mouth 3 (three) times daily as needed for anxiety.   Yes Historical Provider, MD  gabapentin (NEURONTIN) 600 MG tablet Take 600 mg by mouth at bedtime.   Yes Historical Provider, MD  lisinopril-hydrochlorothiazide (PRINZIDE,ZESTORETIC) 20-25 MG per tablet Take 1 tablet by mouth daily.     Yes Historical Provider, MD  oxyCODONE-acetaminophen (PERCOCET) 10-325 MG per tablet Take 0.5-1 tablets by mouth every 8 (eight) hours as needed for pain.    Yes Historical Provider, MD  rivaroxaban (XARELTO) 20 MG TABS tablet Take 20 mg by mouth daily with supper.   Yes Historical Provider, MD  simvastatin (ZOCOR) 20 MG tablet Take 20 mg by mouth every evening.   Yes Historical Provider, MD  Vortioxetine HBr 10 MG TABS Take 10 mg by mouth every evening.    Yes Historical Provider, MD  HYDROcodone-acetaminophen (NORCO/VICODIN) 5-325 MG per tablet Take 2 tablets by mouth every 4 (four) hours as needed. Patient not taking: Reported on 01/06/2015 10/08/14   Tanna Furry, MD     No Known Allergies  Social History:  reports that she has been smoking Cigarettes.  She has been smoking about 1.00 pack per day. She does not have any smokeless tobacco history on file. She reports that she does not drink alcohol or use illicit drugs.     Family History:   CAD in Father, # Brothers, and Mother  CHF in  Mother   Physical Exam:  GEN:  Pleasant Morbidly Obese  64 y.o. Caucasian female examined and in no acute  distress; cooperative with exam Filed Vitals:   01/06/15 1700 01/06/15 1715 01/06/15 1849 01/06/15 1915  BP: 141/56  148/102 145/70  Pulse: 71 69 76 75  Temp:   98.2 F (36.8 C)   TempSrc:   Oral   Resp: 21 16 23 27   SpO2: 96% 97% 98% 97%   Blood pressure 145/70, pulse 75, temperature 98.2 F (36.8 C), temperature source Oral, resp. rate 27, SpO2 97 %. PSYCH: She is alert and oriented x4; does not appear anxious does not appear depressed; affect is normal HEENT: Normocephalic and Atraumatic, Mucous membranes pink; PERRLA; EOM intact; Fundi:  Benign;  No scleral icterus, Nares: Patent, Oropharynx: Clear, Edentulous,    Neck:  FROM, No Cervical Lymphadenopathy nor Thyromegaly or Carotid Bruit; No JVD; Breasts:: Not examined CHEST WALL: No tenderness CHEST: Normal respiration, clear to auscultation bilaterally HEART: Regular rate and rhythm; no murmurs rubs or gallops BACK: No kyphosis or scoliosis; No CVA tenderness ABDOMEN: Positive Bowel Sounds, Obese, Soft Non-Tender, No Rebound or Guarding; No Masses, No Organomegaly. Rectal Exam: Not done EXTREMITIES: No  Cyanosis, Clubbing, Trace BLE Edema; No Ulcerations. Genitalia: not examined PULSES: 2+ and symmetric SKIN: Normal hydration no rash or ulceration CNS:  Alert and Oriented x 4, No Focal Deficits Mental Status:  Alert, Oriented, Thought Content Appropriate. Vascular: pulses palpable throughout    Labs on Admission:  Basic Metabolic Panel:  Recent Labs Lab 01/06/15 1550  NA 140  K 3.6  CL 101  CO2 27  GLUCOSE 111*  BUN 10  CREATININE 1.07*  CALCIUM 9.8   Liver Function Tests:  Recent Labs Lab 01/06/15 1550  AST 24  ALT 16  ALKPHOS 75  BILITOT 0.4  PROT 7.7  ALBUMIN 4.4   No results for input(s): LIPASE, AMYLASE in the last 168 hours. No results for input(s): AMMONIA in the last 168 hours. CBC:  Recent Labs Lab 01/06/15 1550  WBC 11.1*  NEUTROABS 7.0  HGB 16.5*  HCT 49.7*  MCV 90.5  PLT 307    Cardiac Enzymes:  Recent Labs Lab 01/06/15 1550  TROPONINI <0.03    BNP (last 3 results)  Recent Labs  01/06/15 1515  BNP 19.0    ProBNP (last 3 results) No results for input(s): PROBNP in the last 8760 hours.  CBG: No results for input(s): GLUCAP in the last 168 hours.  Radiological Exams on Admission: Dg Chest 2 View  01/06/2015  CLINICAL DATA:  64 year old female with chest pain for several weeks. EXAM: CHEST  2 VIEW COMPARISON:  None. FINDINGS: The cardiomediastinal silhouette is unremarkable. There is no evidence of focal airspace disease, pulmonary edema, suspicious pulmonary nodule/mass, pleural effusion, or pneumothorax. No acute bony abnormalities are identified. IMPRESSION: No active cardiopulmonary disease. Electronically Signed   By: Margarette Canada M.D.   On: 01/06/2015 16:24   Ct Angio Chest Pe W/cm &/or Wo Cm  01/06/2015  CLINICAL DATA:  Recurrent chest pain and shortness of breath over last week. Clinical suspicion for pulmonary embolism. EXAM: CT  ANGIOGRAPHY CHEST WITH CONTRAST TECHNIQUE: Multidetector CT imaging of the chest was performed using the standard protocol during bolus administration of intravenous contrast. Multiplanar CT image reconstructions and MIPs were obtained to evaluate the vascular anatomy. CONTRAST:  131mL OMNIPAQUE IOHEXOL 350 MG/ML SOLN COMPARISON:  None. FINDINGS: Mediastinum/Lymph Nodes: No pulmonary emboli or thoracic aortic dissection identified. No masses or pathologically enlarged lymph nodes identified. Lungs/Pleura: No pulmonary mass, infiltrate, or effusion. Upper abdomen: No acute findings. Musculoskeletal: No chest wall mass or suspicious bone lesions identified. Review of the MIP images confirms the above findings. IMPRESSION: Negative. No evidence of pulmonary embolism or other active disease within the thorax. Electronically Signed   By: Earle Gell M.D.   On: 01/06/2015 18:02     EKG: Independently reviewed. Normal Sinus Rhythm  Rate =77 No Acute S-T Changes    Assessment/Plan:   64 y.o. female with  Principal Problem:   1.     Chest pain   Cardiac Monitoring   Cycle Troponins   Nitropaste, O2 ASA,    Consider for Stress Testing   Check FAsting Lipids in AM   2D ECHO in AM   Active Problems:   2.     Hypertension   Continue Prinizide Rx   Monitor BPs        3.     Hyperlipidemia   On Simvastatin RX   Check Fasting Lipids     4.     PE (pulmonary embolism)/DVT (deep vein thrombosis)    continue Xarelto Rx     5.     Leukocytosis- Stress Rxn vs Early Infection   Monitor Trend     6.   Tobacco use disorder   Counseled   Nicotine Patch while In hosptial    7.    DVT Prophylaxis   On Xarelto Rx         Code Status:     FULL CODE        Family Communication:   Son and Daughter in [place] at bedside      Disposition Plan:    Observation Status        Time spent:  69 Minutes      Theressa Millard [facility] Pager [phone]   If [place] Please Contact the Day Rounding Team MD for [facility]  If 7PM-7AM, Please Contact Night-Floor Coverage  [email or web address] Password [email or web address] 01/06/2015, 7:42 PM     ADDENDUM:   Patient was seen and examined on 01/06/2015

## 2015-01-06 NOTE — ED Provider Notes (Signed)
CSN: [id number]     Arrival date & time 01/06/15  1519 History   First MD Initiated Contact with Patient 01/06/15 1520     Chief Complaint  Patient presents with  . Chest pain     (Consider location/radiation/quality/duration/timing/severity/associated sxs/prior Treatment) HPI Patient has had worsening shortness of breath over the last few days. Decreased ability to do her regular activities. Also has episodes of chest pain. Will have tightness. Tightness does get worse with exertion. No cough. States she has had nausea and emesis. Decreased appetite but no difficulty eating. States she has had projectile vomiting. No fevers or chills. She has a previous history of DVT and pulmonary embolism. She is currently on Xarelto and has had every day over at least the last 2 weeks. Past Medical History  Diagnosis Date  . Depression   . Hypertension   . DVT (deep vein thrombosis) in pregnancy   . PE (pulmonary embolism)    Past Surgical History  Procedure Laterality Date  . Tonsillectomy    . Abdominal hysterectomy     History reviewed. No pertinent family history. Social History  Substance Use Topics  . Smoking status: Current Every Day Smoker -- 1.00 packs/day    Types: Cigarettes  . Smokeless tobacco: None  . Alcohol Use: No   OB History    Gravida Para Term Preterm AB TAB SAB Ectopic Multiple Living            2     Review of Systems  Constitutional: Negative for activity change and appetite change.  Eyes: Negative for pain.  Respiratory: Positive for shortness of breath. Negative for chest tightness.   Cardiovascular: Negative for chest pain and leg swelling.  Gastrointestinal: Negative for nausea, vomiting, abdominal pain and diarrhea.  Genitourinary: Negative for flank pain.  Musculoskeletal: Negative for back pain and neck stiffness.  Skin: Negative for rash.  Neurological: Negative for weakness, numbness and headaches.  Psychiatric/Behavioral: Negative for behavioral  problems.      Allergies  Review of patient's allergies indicates no known allergies.  Home Medications   Prior to Admission medications   Medication Sig Start Date End Date Taking? Authorizing Provider  allopurinol (ZYLOPRIM) 100 MG tablet Take 100 mg by mouth daily.   Yes Historical Provider, MD  diazepam (VALIUM) 2 MG tablet Take 2 mg by mouth 3 (three) times daily as needed for anxiety.   Yes Historical Provider, MD  gabapentin (NEURONTIN) 600 MG tablet Take 600 mg by mouth at bedtime.   Yes Historical Provider, MD  lisinopril-hydrochlorothiazide (PRINZIDE,ZESTORETIC) 20-25 MG per tablet Take 1 tablet by mouth daily.     Yes Historical Provider, MD  oxyCODONE-acetaminophen (PERCOCET) 10-325 MG per tablet Take 0.5-1 tablets by mouth every 8 (eight) hours as needed for pain.    Yes Historical Provider, MD  rivaroxaban (XARELTO) 20 MG TABS tablet Take 20 mg by mouth daily with supper.   Yes Historical Provider, MD  simvastatin (ZOCOR) 20 MG tablet Take 20 mg by mouth every evening.   Yes Historical Provider, MD  Vortioxetine HBr 10 MG TABS Take 10 mg by mouth every evening.    Yes Historical Provider, MD  HYDROcodone-acetaminophen (NORCO/VICODIN) 5-325 MG per tablet Take 2 tablets by mouth every 4 (four) hours as needed. Patient not taking: Reported on 01/06/2015 10/08/14   Tanna Furry, MD   BP 185/99 mmHg  Pulse 80  Temp(Src) 98.2 F (36.8 C) (Oral)  Resp 27  Ht 5\' 4"  (1.626 m)  Wt 251  lb 4.8 oz (113.989 kg)  BMI 43.11 kg/m2  SpO2 100% Physical Exam  Constitutional: She is oriented to person, place, and time. She appears well-developed and well-nourished.  HENT:  Head: Normocephalic and atraumatic.  Eyes: EOM are normal. Pupils are equal, round, and reactive to light.  Neck: Normal range of motion. Neck supple.  Cardiovascular: Normal rate, regular rhythm and normal heart sounds.   No murmur heard. Pulmonary/Chest: Effort normal and breath sounds normal. No respiratory  distress. She has no wheezes. She has no rales.  Abdominal: Soft. Bowel sounds are normal. She exhibits no distension.  Musculoskeletal: Normal range of motion.  Neurological: She is alert and oriented to person, place, and time. No cranial nerve deficit.  Skin: Skin is warm and dry.  Psychiatric: She has a normal mood and affect. Her speech is normal.  Nursing note and vitals reviewed.   ED Course  Procedures (including critical care time) Labs Review Labs Reviewed  COMPREHENSIVE METABOLIC PANEL - Abnormal; Notable for the following:    Glucose, Bld 111 (*)    Creatinine, Ser 1.07 (*)    GFR calc non Af Amer 53 (*)    All other components within normal limits  CBC WITH DIFFERENTIAL/PLATELET - Abnormal; Notable for the following:    WBC 11.1 (*)    RBC 5.49 (*)    Hemoglobin 16.5 (*)    HCT 49.7 (*)    All other components within normal limits  TROPONIN I  BRAIN NATRIURETIC PEPTIDE  TROPONIN I  LIPID PANEL  BASIC METABOLIC PANEL  CBC  TROPONIN I  TROPONIN I    Imaging Review Dg Chest 2 View  01/06/2015  CLINICAL DATA:  64 year old female with chest pain for several weeks. EXAM: CHEST  2 VIEW COMPARISON:  None. FINDINGS: The cardiomediastinal silhouette is unremarkable. There is no evidence of focal airspace disease, pulmonary edema, suspicious pulmonary nodule/mass, pleural effusion, or pneumothorax. No acute bony abnormalities are identified. IMPRESSION: No active cardiopulmonary disease. Electronically Signed   By: Margarette Canada M.D.   On: 01/06/2015 16:24   Ct Angio Chest Pe W/cm &/or Wo Cm  01/06/2015  CLINICAL DATA:  Recurrent chest pain and shortness of breath over last week. Clinical suspicion for pulmonary embolism. EXAM: CT ANGIOGRAPHY CHEST WITH CONTRAST TECHNIQUE: Multidetector CT imaging of the chest was performed using the standard protocol during bolus administration of intravenous contrast. Multiplanar CT image reconstructions and MIPs were obtained to evaluate  the vascular anatomy. CONTRAST:  128mL OMNIPAQUE IOHEXOL 350 MG/ML SOLN COMPARISON:  None. FINDINGS: Mediastinum/Lymph Nodes: No pulmonary emboli or thoracic aortic dissection identified. No masses or pathologically enlarged lymph nodes identified. Lungs/Pleura: No pulmonary mass, infiltrate, or effusion. Upper abdomen: No acute findings. Musculoskeletal: No chest wall mass or suspicious bone lesions identified. Review of the MIP images confirms the above findings. IMPRESSION: Negative. No evidence of pulmonary embolism or other active disease within the thorax. Electronically Signed   By: Earle Gell M.D.   On: 01/06/2015 18:02   I have personally reviewed and evaluated these images and lab results as part of my medical decision-making.   EKG Interpretation None      MDM   Final diagnoses:  Chest pain, unspecified chest pain type    Patient with chest pain. EKG reassuring. History of PE. Negative CT [place]. Will admit.    Davonna Belling, MD 01/07/15 [phone]

## 2015-01-07 ENCOUNTER — Encounter (HOSPITAL_COMMUNITY)
Admission: EM | Disposition: A | Discharge status: Home / Self Care | Payer: Self-pay | Source: Home / Self Care | Attending: Emergency Medicine

## 2015-01-07 ENCOUNTER — Observation Stay (HOSPITAL_BASED_OUTPATIENT_CLINIC_OR_DEPARTMENT_OTHER): Payer: Medicare Other

## 2015-01-07 ENCOUNTER — Encounter (HOSPITAL_COMMUNITY): Payer: Self-pay | Admitting: Adult Health

## 2015-01-07 ENCOUNTER — Observation Stay (HOSPITAL_COMMUNITY): Payer: Medicare Other

## 2015-01-07 DIAGNOSIS — R079 Chest pain, unspecified: Secondary | ICD-10-CM | POA: Diagnosis not present

## 2015-01-07 DIAGNOSIS — E785 Hyperlipidemia, unspecified: Secondary | ICD-10-CM

## 2015-01-07 DIAGNOSIS — F17200 Nicotine dependence, unspecified, uncomplicated: Secondary | ICD-10-CM

## 2015-01-07 DIAGNOSIS — I10 Essential (primary) hypertension: Secondary | ICD-10-CM

## 2015-01-07 DIAGNOSIS — Z86718 Personal history of other venous thrombosis and embolism: Secondary | ICD-10-CM

## 2015-01-07 DIAGNOSIS — D72829 Elevated white blood cell count, unspecified: Secondary | ICD-10-CM | POA: Diagnosis not present

## 2015-01-07 DIAGNOSIS — I2699 Other pulmonary embolism without acute cor pulmonale: Secondary | ICD-10-CM | POA: Diagnosis not present

## 2015-01-07 DIAGNOSIS — R1112 Projectile vomiting: Secondary | ICD-10-CM

## 2015-01-07 DIAGNOSIS — Z8249 Family history of ischemic heart disease and other diseases of the circulatory system: Secondary | ICD-10-CM

## 2015-01-07 DIAGNOSIS — R5383 Other fatigue: Secondary | ICD-10-CM

## 2015-01-07 DIAGNOSIS — Z86711 Personal history of pulmonary embolism: Secondary | ICD-10-CM

## 2015-01-07 LAB — BASIC METABOLIC PANEL
Anion gap: 10 (ref 5–15)
BUN: 11 mg/dL (ref 6–20)
CO2: 27 mmol/L (ref 22–32)
Calcium: 9 mg/dL (ref 8.9–10.3)
Chloride: 102 mmol/L (ref 101–111)
Creatinine, Ser: 1.09 mg/dL — ABNORMAL HIGH (ref 0.44–1.00)
GFR calc Af Amer: >60 mL/min (ref >60)
GFR calc non Af Amer: 52 mL/min — ABNORMAL LOW (ref >60)
Glucose, Bld: 101 mg/dL — ABNORMAL HIGH (ref 65–99)
Potassium: 3.6 mmol/L (ref 3.5–5.1)
Sodium: 139 mmol/L (ref 135–145)

## 2015-01-07 LAB — LIPID PANEL
Cholesterol: 196 mg/dL (ref 0–200)
HDL: 27 mg/dL — ABNORMAL LOW (ref >40)
LDL Cholesterol: 120 mg/dL — ABNORMAL HIGH (ref 0–99)
Total CHOL/HDL Ratio: 7.3 RATIO
Triglycerides: 246 mg/dL — ABNORMAL HIGH (ref <150)
VLDL: 49 mg/dL — ABNORMAL HIGH (ref 0–40)

## 2015-01-07 LAB — PROTIME-INR
INR: 1.15 (ref 0.00–1.49)
Prothrombin Time: 14.8 seconds (ref 11.6–15.2)

## 2015-01-07 LAB — CBC
HCT: 42.7 % (ref 36.0–46.0)
Hemoglobin: 13.8 g/dL (ref 12.0–15.0)
MCH: 29.3 pg (ref 26.0–34.0)
MCHC: 32.3 g/dL (ref 30.0–36.0)
MCV: 90.7 fL (ref 78.0–100.0)
Platelets: 269 10*3/uL (ref 150–400)
RBC: 4.71 MIL/uL (ref 3.87–5.11)
RDW: 13.3 % (ref 11.5–15.5)
WBC: 10.1 10*3/uL (ref 4.0–10.5)

## 2015-01-07 LAB — TROPONIN I
Troponin I: <0.03 ng/mL (ref <0.031)
Troponin I: <0.03 ng/mL (ref <0.031)

## 2015-01-07 LAB — HEPARIN LEVEL (UNFRACTIONATED)
Heparin Unfractionated: 0.71 IU/mL — ABNORMAL HIGH (ref 0.30–0.70)
Heparin Unfractionated: <0.1 IU/mL — ABNORMAL LOW (ref 0.30–0.70)

## 2015-01-07 LAB — APTT
aPTT: 104 seconds — ABNORMAL HIGH (ref 24–37)
aPTT: 26 seconds (ref 24–37)

## 2015-01-07 SURGERY — INVASIVE LAB ABORTED CASE

## 2015-01-07 MED ORDER — GABAPENTIN 300 MG PO CAPS
600.0000 mg | ORAL_CAPSULE | Freq: Every day | ORAL | Status: DC
  Administered 2015-01-07: 600 mg via ORAL
  Filled 2015-01-07: qty 2

## 2015-01-07 MED ORDER — LISINOPRIL 20 MG PO TABS
20.0000 mg | ORAL_TABLET | Freq: Every day | ORAL | Status: DC
  Administered 2015-01-07: 20 mg via ORAL
  Filled 2015-01-07 (×2): qty 1

## 2015-01-07 MED ORDER — HEPARIN BOLUS VIA INFUSION
2000.0000 [IU] | Freq: Once | INTRAVENOUS | Status: AC
  Administered 2015-01-07: 2000 [IU] via INTRAVENOUS
  Filled 2015-01-07: qty 2000

## 2015-01-07 MED ORDER — ASPIRIN 81 MG PO CHEW: 81.0000 mg | CHEWABLE_TABLET | ORAL | Status: DC

## 2015-01-07 MED ORDER — LISINOPRIL-HYDROCHLOROTHIAZIDE 20-25 MG PO TABS: 1.0000 | ORAL_TABLET | Freq: Every day | ORAL | Status: DC

## 2015-01-07 MED ORDER — SODIUM CHLORIDE 0.9 % IV SOLN: 250.0000 mL | INTRAVENOUS | Status: DC | PRN

## 2015-01-07 MED ORDER — SODIUM CHLORIDE 0.9 % WEIGHT BASED INFUSION
1.0000 mL/kg/h | INTRAVENOUS | Status: DC
  Administered 2015-01-07 – 2015-01-08 (×3): 1 mL/kg/h via INTRAVENOUS

## 2015-01-07 MED ORDER — INFLUENZA VAC SPLIT QUAD 0.5 ML IM SUSY: 0.5000 mL | PREFILLED_SYRINGE | INTRAMUSCULAR | Status: DC

## 2015-01-07 MED ORDER — HYDROCHLOROTHIAZIDE 25 MG PO TABS
25.0000 mg | ORAL_TABLET | Freq: Every day | ORAL | Status: DC
  Administered 2015-01-07: 25 mg via ORAL
  Filled 2015-01-07 (×2): qty 1

## 2015-01-07 MED ORDER — SODIUM CHLORIDE 0.9 % WEIGHT BASED INFUSION
3.0000 mL/kg/h | INTRAVENOUS | Status: AC
  Administered 2015-01-07: 3 mL/kg/h via INTRAVENOUS

## 2015-01-07 MED ORDER — HEPARIN (PORCINE) IN NACL 100-0.45 UNIT/ML-% IJ SOLN
1400.0000 [IU]/h | INTRAMUSCULAR | Status: DC
  Administered 2015-01-07: 1000 [IU]/h via INTRAVENOUS
  Administered 2015-01-08: 1250 [IU]/h via INTRAVENOUS
  Filled 2015-01-07 (×2): qty 250

## 2015-01-07 MED ORDER — SODIUM CHLORIDE 0.9 % IJ SOLN: 3.0000 mL | Freq: Two times a day (BID) | INTRAMUSCULAR | Status: DC

## 2015-01-07 MED ORDER — SODIUM CHLORIDE 0.9 % IJ SOLN: 3.0000 mL | INTRAMUSCULAR | Status: DC | PRN

## 2015-01-07 MED ORDER — HEPARIN BOLUS VIA INFUSION
4000.0000 [IU] | Freq: Once | INTRAVENOUS | Status: AC
Start: 2015-01-07 — End: 2015-01-07
  Administered 2015-01-07: 4000 [IU] via INTRAVENOUS
  Filled 2015-01-07: qty 4000

## 2015-01-07 SURGICAL SUPPLY — 4 items
KIT HEART LEFT (KITS) ×1 IMPLANT
PACK CARDIAC CATHETERIZATION (CUSTOM PROCEDURE TRAY) ×1 IMPLANT
TRANSDUCER W/STOPCOCK (MISCELLANEOUS) ×1 IMPLANT
TUBING CIL FLEX 10 FLL-RA (TUBING) ×1 IMPLANT

## 2015-01-07 NOTE — Consult Note (Signed)
CARDIOLOGY CONSULT NOTE   Patient ID: Jennifer [name] MRN: [id number] DOB/AGE: 65-Apr-1952 65 y.o.  Admit Date: 01/06/2015 Referring Physician: PTH-Chui Primary Physician: Jana Half Consulting Cardiologist: Kate Sable MD Primary Cardiologist:  Reason for Consultation: Chest Pain  Clinical Summary Jennifer [name] is a 64 y.o.female with hx of hypertension, hyperlipidemia, PE with DVT on Xarelto, and tobacco abuse presents to ER with complaints of chest pain. She states that she has been feeling weak and tired for the last several week,over the last few days she has felt like she is carrying a log across her shoulders, and has had intermittent severe "pinching feeling" in her left chest, and left arm tightness that is coming and going.Nausea and vomiting, with some dyspnea.    She has a very strong family history with both parents dying from MI, brother who had 3 MI between the age of 61-43, and subsequent CABG, a second brother with CABG and a third brother just released from hospital with CABG.   In ER she was found to be hypertensive, HR 76, R-16, afebrile. Creatinine 1.07, (baseline similar to previous results), Potassium 3.6, not anemic, but leukocytosis was noted, CT was negative for PE, CXR negative for CHF or pneumonia. EKG NSR with poor R wave progression. Troponin negative X 3. Due to CVRF we are asked to evaluate chest pain.  She states that if given the option to have cath she want to have it. She is currently pain free.    No Known Allergies  Medications Scheduled Medications: . allopurinol  100 mg Oral Daily  . aspirin EC  325 mg Oral Daily  . gabapentin  600 mg Oral QHS  . [START ON 01/08/2015] Influenza vac split quadrivalent PF  0.5 mL Intramuscular Tomorrow-1000  . nicotine  14 mg Transdermal Daily  . nitroGLYCERIN  0.5 inch Topical 4 times per day  . rivaroxaban  20 mg Oral Q supper  . simvastatin  20 mg Oral QPM  . sodium chloride  3 mL Intravenous  Q12H  . sodium chloride  3 mL Intravenous Q12H      PRN Medications: sodium chloride, acetaminophen **OR** acetaminophen, alum & mag hydroxide-simeth, diazepam, HYDROmorphone (DILAUDID) injection, ondansetron **OR** ondansetron (ZOFRAN) IV, oxyCODONE, sodium chloride   Past Medical History  Diagnosis Date  . Depression   . Hypertension   . DVT (deep vein thrombosis) in pregnancy   . PE (pulmonary embolism)     Past Surgical History  Procedure Laterality Date  . Tonsillectomy    . Abdominal hysterectomy      Family History  Problem Relation Age of Onset  . Heart attack Mother   . Heart attack Father   . Heart attack Brother     CABG  . Heart attack Brother     CABG  . Heart attack Brother     CABG    Social History Jennifer [name] reports that she has been smoking Cigarettes.  She has been smoking about 1.00 pack per day. She does not have any smokeless tobacco history on file. Jennifer [name] reports that she does not drink alcohol.  Review of Systems Complete review of systems are found to be negative unless outlined in H&P above.  Physical Examination Blood pressure 131/58, pulse 67, temperature 98.2 F (36.8 C), temperature source Oral, resp. rate 18, height 5\' 4"  (1.626 m), weight 251 lb 4.8 oz (113.989 kg), SpO2 98 %.  Intake/Output Summary (Last 24 hours) at 01/07/15 0953 Last data filed at 01/07/15 [phone]  Gross per 24 hour  Intake      0 ml  Output      0 ml  Net      0 ml    Telemetry: NSR  GEN: No acute distress, obese.  HEENT: Conjunctiva and lids normal, oropharynx clear with moist mucosa. Neck: Supple, no elevated JVP or carotid bruits, no thyromegaly. Lungs: Some Cardiac: Regular rate and rhythm, no S3 or significant systolic murmur, no pericardial rub. Abdomen: Soft, nontender, no hepatomegaly, bowel sounds present, no guarding or rebound. Extremities: No pitting edema, distal pulses 2+. Skin: Warm and dry. Musculoskeletal: No  kyphosis. Neuropsychiatric: Alert and oriented x3, affect grossly appropriate.  Prior Cardiac Testing/Procedures  Lab Results  Basic Metabolic Panel:  Recent Labs Lab 01/06/15 1550 01/07/15 0311  NA 140 139  K 3.6 3.6  CL 101 102  CO2 27 27  GLUCOSE 111* 101*  BUN 10 11  CREATININE 1.07* 1.09*  CALCIUM 9.8 9.0    Liver Function Tests:  Recent Labs Lab 01/06/15 1550  AST 24  ALT 16  ALKPHOS 75  BILITOT 0.4  PROT 7.7  ALBUMIN 4.4    CBC:  Recent Labs Lab 01/06/15 1550 01/07/15 0311  WBC 11.1* 10.1  NEUTROABS 7.0  --   HGB 16.5* 13.8  HCT 49.7* 42.7  MCV 90.5 90.7  PLT 307 269    Cardiac Enzymes:  Recent Labs Lab 01/06/15 1550 01/06/15 2125 01/07/15 0311  TROPONINI <0.03 <0.03 <0.03    Radiology: Dg Chest 2 View  01/06/2015  CLINICAL DATA:  64 year old female with chest pain for several weeks. EXAM: CHEST  2 VIEW COMPARISON:  None. FINDINGS: The cardiomediastinal silhouette is unremarkable. There is no evidence of focal airspace disease, pulmonary edema, suspicious pulmonary nodule/mass, pleural effusion, or pneumothorax. No acute bony abnormalities are identified. IMPRESSION: No active cardiopulmonary disease. Electronically Signed   By: Margarette Canada M.D.   On: 01/06/2015 16:24   Ct Angio Chest Pe W/cm &/or Wo Cm  01/06/2015  CLINICAL DATA:  Recurrent chest pain and shortness of breath over last week. Clinical suspicion for pulmonary embolism. EXAM: CT ANGIOGRAPHY CHEST WITH CONTRAST TECHNIQUE: Multidetector CT imaging of the chest was performed using the standard protocol during bolus administration of intravenous contrast. Multiplanar CT image reconstructions and MIPs were obtained to evaluate the vascular anatomy. CONTRAST:  141mL OMNIPAQUE IOHEXOL 350 MG/ML SOLN COMPARISON:  None. FINDINGS: Mediastinum/Lymph Nodes: No pulmonary emboli or thoracic aortic dissection identified. No masses or pathologically enlarged lymph nodes identified. Lungs/Pleura:  No pulmonary mass, infiltrate, or effusion. Upper abdomen: No acute findings. Musculoskeletal: No chest wall mass or suspicious bone lesions identified. Review of the MIP images confirms the above findings. IMPRESSION: Negative. No evidence of pulmonary embolism or other active disease within the thorax. Electronically Signed   By: Earle Gell M.D.   On: 01/06/2015 18:02     ECG: NSR with poor R-wave progression.    Impression and Recommendations  1. Chest Pain: Worrisome for unstable angina with multiple CVRF to include FH, hypertension, hypercholesterolemia, tobacco abuse, and obesity. Troponin and EKG are negative arguing against ACS. She is pain free with NTG paste.  She is favoring cardiac cath over stress test. I would agree with this in light of her strong family history and risk factors. Will discuss further with Dr. Bronson Ing.   2. Hypertension: She was hypertensive on arrival but has gotten back to baseline.   3. Hypertriglyceridemia: TG 246. She is on simvastatin currently. May need to  check for diabetes.   4. 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 She has been on Xarelto since the summer, was on coumadin prior to this.   5. Tobacco Abuse: Smoking since the age of 40. Cessation is required. On Nicoderm patch. Some wheezing is noted.    Signed: Phill Myron. Lawrence NP [place]  01/07/2015, 9:53 AM Co-Sign MD  The patient was seen and examined, and I agree with the assessment and plan as documented above, with modifications as noted below. Patient with aforementioned history and presentation admitted with chest pain and has since ruled out for an ACS.  Has had chest pain (described as a "log on my chest") for the past 3 days, heaviness across shoulder blades, and left arm pain, accompanied by projectile vomiting. Denies diarrhea and abdominal pain. Also complains of progressive fatigue over the past month to the point where she now has to sit down to wash the dishes and can only do  half of them before becoming fatigued.  2 brothers had CABG and third brother about to have CABG (history of MI's in early 60's)  She has been smoking 1 ppd since age 25.  Symptoms were immediately relieved with nitroglycerin.  She has a high pretest probability for CAD and thus coronary angiography (rather than stress testing) is indicated.  I have discussed this with the patient and she is not only willing but is eager to proceed.  Will d/c Xarelto and start heparin and arrange for transfer to [facility].   Kate Sable, MD, [facility]  01/07/2015 10:28 AM

## 2015-01-07 NOTE — Progress Notes (Signed)
ANTICOAGULATION CONSULT NOTE - FOLLOW UP    HL = < 0.1 (goal 0.3 - 0.7 units/mL) APTT = 26 (goal 66-102 sec) Heparin dosing weight = 82 kg   Assessment: 20 YOF with history of PE on Xarelto PTA (last dose 01/05/15 PM).  Patient admitted with chest pain and was transitioned to IV heparin for ACS.  Both heparin level and aPTT are sub-therapeutic.  No bleeding nor issue with heparin infusion per RN.   Plan: - Rebolus with heparin 2000 units IV x 1, then - Increase heparin gtt to 1250 units/hr - Check 6 hr HL - Daily HL / CBC    Lima Chillemi D. Mina Marble, PharmD, BCPS Pager:  [phone] 01/07/2015, 9:17 PM

## 2015-01-07 NOTE — Progress Notes (Signed)
ANTICOAGULATION CONSULT NOTE - Initial Consult  Pharmacy Consult for heparin Indication: chest pain/ACS  No Known Allergies  Patient Measurements: Height: 5\' 4"  (162.6 cm) Weight: 251 lb 4.8 oz (113.989 kg) IBW/kg (Calculated) : 54.7 Heparin Dosing Weight: 82.1kg  Vital Signs: Temp: 98.2 F (36.8 C) (10/27 0800) Temp Source: Oral (10/27 0800) BP: 131/58 mmHg (10/27 0800) Pulse Rate: 67 (10/27 0800)  Labs:  Recent Labs  01/06/15 1550 01/06/15 2125 01/07/15 0311 01/07/15 0907  HGB 16.5*  --  13.8  --   HCT 49.7*  --  42.7  --   PLT 307  --  269  --   CREATININE 1.07*  --  1.09*  --   TROPONINI <0.03 <0.03 <0.03 <0.03    Estimated Creatinine Clearance: 63.7 mL/min (by C-G formula based on Cr of 1.09).   Medical History: Past Medical History  Diagnosis Date  . Depression   . Hypertension   . DVT (deep vein thrombosis) in pregnancy   . PE (pulmonary embolism)     Medications:  Prescriptions prior to admission  Medication Sig Dispense Refill Last Dose  . allopurinol (ZYLOPRIM) 100 MG tablet Take 100 mg by mouth daily.   01/06/2015 at Unknown time  . diazepam (VALIUM) 2 MG tablet Take 2 mg by mouth 3 (three) times daily as needed for anxiety.   01/05/2015 at Unknown time  . gabapentin (NEURONTIN) 600 MG tablet Take 600 mg by mouth at bedtime.   01/05/2015 at Unknown time  . lisinopril-hydrochlorothiazide (PRINZIDE,ZESTORETIC) 20-25 MG per tablet Take 1 tablet by mouth daily.     01/06/2015 at Unknown time  . oxyCODONE-acetaminophen (PERCOCET) 10-325 MG per tablet Take 0.5-1 tablets by mouth every 8 (eight) hours as needed for pain.    01/05/2015 at Unknown time  . rivaroxaban (XARELTO) 20 MG TABS tablet Take 20 mg by mouth daily with supper.   01/05/2015 at 2130  . simvastatin (ZOCOR) 20 MG tablet Take 20 mg by mouth every evening.   01/05/2015 at Unknown time  . Vortioxetine HBr 10 MG TABS Take 10 mg by mouth every evening.    Past Week at Unknown time  .  HYDROcodone-acetaminophen (NORCO/VICODIN) 5-325 MG per tablet Take 2 tablets by mouth every 4 (four) hours as needed. (Patient not taking: Reported on 01/06/2015) 10 tablet 0     Assessment: 64 y.o.female with hx of hypertension, hyperlipidemia, PE with DVT on Xarelto, and tobacco abuse presents to ER with complaints of chest pain. Pt has been feeling weak and tired with a pinching in her left chest and left arm tightness. Also noted with nausea and vomiting and dyspnea. Initiaing heparin possible ACS. Last dose of xarelto 10/25 in the evening.   Goal of Therapy:  Heparin level 0.3-0.7 units/ml Monitor platelets by anticoagulation protocol: Yes   Plan:  Give 4000 units bolus x 1 Start heparin infusion at 1000 units/hr Check anti-Xa level in 6 hours and daily while on heparin Continue to monitor H&H and platelets  Trenton Gammon, Lucillia Corson L 01/07/2015,10:44 AM

## 2015-01-07 NOTE — Progress Notes (Signed)
[facility] PROGRESS NOTE  Jennifer [name] LKG:[id number] DOB: [birth year] DOA: 01/06/2015 PCP: Jennifer [name]  HPI/Brief narrative 64 y.o. 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 Patient was noted to have a significant family history of heart disease. Patient was admitted for further work up.  Assessment/Plan: Principal Problem:  1. Chest pain - Strong family history of heart disease noted - Pt currently denies chest pain or sob - Cardiology was consulted. Recs noted and appreciated. Plans for starting heparin gtt and transfer to [facility] for cath  Active Problems:  2. Hypertension - Patient is continued on Prinizide - Stable   3. Hyperlipidemia - Continued on Simvastatin - LDL of 120   4. PE (pulmonary embolism)/DVT (deep vein thrombosis)  - Initially continued on Xarelto, however, transitioned to heparin gtt per above    5. Leukocytosis - Stress Rxn vs Early Infection - Resolved - Presently afebrile. Cont to monitor   6. Tobacco use disorder -Counseled -Nicotine Patch ordered while In hosptial   7. DVT Prophylaxis - Initially on Xarelto, transitioned to heparin gtt  Code Status: Full Family Communication: Pt in room Disposition Plan: Transfer to [facility]   Consultants:  Cardiology  Procedures:    Antibiotics: Anti-infectives    None      HPI/Subjective: States feeling better. Denies chest pain currently   Objective: Filed Vitals:   01/07/15 0400 01/07/15 0800 01/07/15 1307 01/07/15 1445  BP: 132/62 131/58 130/54 114/70  Pulse: 64 67 63 68  Temp: 97.5 F (36.4 C) 98.2 F (36.8 C) 98.9 F (37.2 C) 98.5 F (36.9 C)  TempSrc: Oral Oral Oral Oral  Resp: 21 18 16    Height:   5\' 4"  (1.626 m)   Weight:   114.125 kg  (251 lb 9.6 oz)   SpO2: 98% 98% 99% 98%    Intake/Output Summary (Last 24 hours) at 01/07/15 1457 Last data filed at 01/07/15 1300  Gross per 24 hour  Intake    240 ml  Output      0 ml  Net    240 ml   Filed Weights   01/06/15 2051 01/07/15 1307  Weight: 113.989 kg (251 lb 4.8 oz) 114.125 kg (251 lb 9.6 oz)    Exam:   General:  Awake, in nad  Cardiovascular: regular, s1, s2  Respiratory: normal resp effort, no wheezing  Abdomen: soft,nondistended  Musculoskeletal: perfused, no clubbing   Data Reviewed: Basic Metabolic Panel:  Recent Labs Lab 01/06/15 1550 01/07/15 0311  NA 140 139  K 3.6 3.6  CL 101 102  CO2 27 27  GLUCOSE 111* 101*  BUN 10 11  CREATININE 1.07* 1.09*  CALCIUM 9.8 9.0   Liver Function Tests:  Recent Labs Lab 01/06/15 1550  AST 24  ALT 16  ALKPHOS 75  BILITOT 0.4  PROT 7.7  ALBUMIN 4.4   No results for input(s): LIPASE, AMYLASE in the last 168 hours. No results for input(s): AMMONIA in the last 168 hours. CBC:  Recent Labs Lab 01/06/15 1550 01/07/15 0311  WBC 11.1* 10.1  NEUTROABS 7.0  --   HGB 16.5* 13.8  HCT 49.7* 42.7  MCV 90.5 90.7  PLT 307 269   Cardiac Enzymes:  Recent Labs Lab 01/06/15 1550 01/06/15 2125 01/07/15 0311 01/07/15 0907  TROPONINI <0.03 <0.03 <0.03 <0.03  BNP (last 3 results)  Recent Labs  01/06/15 1515  BNP 19.0    ProBNP (last 3 results) No results for input(s): PROBNP in the last 8760 hours.  CBG: No results for input(s): GLUCAP in the last 168 hours.  No results found for this or any previous visit (from the past 240 hour(s)).   Studies: Dg Chest 2 View  01/06/2015  CLINICAL DATA:  64 year old female with chest pain for several weeks. EXAM: CHEST  2 VIEW COMPARISON:  None. FINDINGS: The cardiomediastinal silhouette is unremarkable. There is no evidence of focal airspace disease, pulmonary edema, suspicious pulmonary nodule/mass, pleural effusion, or pneumothorax. No acute bony  abnormalities are identified. IMPRESSION: No active cardiopulmonary disease. Electronically Signed   By: Jennifer [name] M.D.   On: 01/06/2015 16:24   Ct Angio Chest Pe W/cm &/or Wo Cm  01/06/2015  CLINICAL DATA:  Recurrent chest pain and shortness of breath over last week. Clinical suspicion for pulmonary embolism. EXAM: CT ANGIOGRAPHY CHEST WITH CONTRAST TECHNIQUE: Multidetector CT imaging of the chest was performed using the standard protocol during bolus administration of intravenous contrast. Multiplanar CT image reconstructions and MIPs were obtained to evaluate the vascular anatomy. CONTRAST:  150mL OMNIPAQUE IOHEXOL 350 MG/ML SOLN COMPARISON:  None. FINDINGS: Mediastinum/Lymph Nodes: No pulmonary emboli or thoracic aortic dissection identified. No masses or pathologically enlarged lymph nodes identified. Lungs/Pleura: No pulmonary mass, infiltrate, or effusion. Upper abdomen: No acute findings. Musculoskeletal: No chest wall mass or suspicious bone lesions identified. Review of the MIP images confirms the above findings. IMPRESSION: Negative. No evidence of pulmonary embolism or other active disease within the thorax. Electronically Signed   By: Jennifer [name] M.D.   On: 01/06/2015 18:02    Scheduled Meds: . allopurinol  100 mg Oral Daily  . aspirin  81 mg Oral Pre-Cath  . aspirin EC  325 mg Oral Daily  . gabapentin  600 mg Oral QHS  . lisinopril  20 mg Oral Daily   And  . hydrochlorothiazide  25 mg Oral Daily  . [START ON 01/08/2015] Influenza vac split quadrivalent PF  0.5 mL Intramuscular Tomorrow-1000  . nicotine  14 mg Transdermal Daily  . nitroGLYCERIN  0.5 inch Topical 4 times per day  . simvastatin  20 mg Oral QPM  . sodium chloride  3 mL Intravenous Q12H  . sodium chloride  3 mL Intravenous Q12H  . sodium chloride  3 mL Intravenous Q12H   Continuous Infusions: . sodium chloride    . heparin 1,000 Units/hr (01/07/15 1101)    Principal Problem:   Chest pain Active Problems:    Hypertension   Hyperlipidemia   PE (pulmonary embolism)   Leukocytosis   Tobacco use disorder   DVT (deep venous thrombosis) ([place])   Jennifer [name], [facility] Pager [phone]. If 7PM-7AM, please contact night-coverage at [email or web address], password [facility] 01/07/2015, 2:57 PM

## 2015-01-07 NOTE — Progress Notes (Signed)
  Echocardiogram 2D Echocardiogram has been performed.  Jennifer [name] 01/07/2015, 4:48 PM

## 2015-01-07 NOTE — Progress Notes (Signed)
Patient transferred to [facility] for cardiac cath via care link.  Report called and given to [facility] on 3W.  Patient stable prior to leaving floor and aware of plan.  Belongings sent with patient.  IV intact with heparin drip infusing at 10.

## 2015-01-08 ENCOUNTER — Encounter (HOSPITAL_COMMUNITY)
Admission: EM | Disposition: A | Discharge status: Home / Self Care | Payer: Self-pay | Source: Home / Self Care | Attending: Emergency Medicine

## 2015-01-08 ENCOUNTER — Encounter (HOSPITAL_COMMUNITY): Payer: Self-pay | Admitting: Internal Medicine

## 2015-01-08 DIAGNOSIS — I25110 Atherosclerotic heart disease of native coronary artery with unstable angina pectoris: Secondary | ICD-10-CM

## 2015-01-08 DIAGNOSIS — I2510 Atherosclerotic heart disease of native coronary artery without angina pectoris: Secondary | ICD-10-CM

## 2015-01-08 HISTORY — PX: CARDIAC CATHETERIZATION: SHX172

## 2015-01-08 LAB — CBC
HCT: 41.3 % (ref 36.0–46.0)
Hemoglobin: 13.3 g/dL (ref 12.0–15.0)
MCH: 28.9 pg (ref 26.0–34.0)
MCHC: 32.2 g/dL (ref 30.0–36.0)
MCV: 89.8 fL (ref 78.0–100.0)
Platelets: 240 10*3/uL (ref 150–400)
RBC: 4.6 MIL/uL (ref 3.87–5.11)
RDW: 13.4 % (ref 11.5–15.5)
WBC: 8.2 10*3/uL (ref 4.0–10.5)

## 2015-01-08 LAB — HEPARIN LEVEL (UNFRACTIONATED): Heparin Unfractionated: 0.25 IU/mL — ABNORMAL LOW (ref 0.30–0.70)

## 2015-01-08 SURGERY — LEFT HEART CATH AND CORONARY ANGIOGRAPHY
Anesthesia: LOCAL

## 2015-01-08 MED ORDER — LIDOCAINE HCL (PF) 1 % IJ SOLN
INTRAMUSCULAR | Status: AC
  Filled 2015-01-08: qty 30

## 2015-01-08 MED ORDER — ATORVASTATIN CALCIUM 80 MG PO TABS: 80.0000 mg | ORAL_TABLET | Freq: Every evening | ORAL | Status: DC

## 2015-01-08 MED ORDER — IOHEXOL 350 MG/ML SOLN
INTRAVENOUS | Status: DC | PRN
  Administered 2015-01-08: 150 mL via INTRAVENOUS

## 2015-01-08 MED ORDER — MIDAZOLAM HCL 2 MG/2ML IJ SOLN
INTRAMUSCULAR | Status: DC | PRN
  Administered 2015-01-08: 1 mg via INTRAVENOUS

## 2015-01-08 MED ORDER — ONDANSETRON HCL 4 MG/2ML IJ SOLN: 4.0000 mg | Freq: Four times a day (QID) | INTRAMUSCULAR | Status: DC | PRN

## 2015-01-08 MED ORDER — NITROGLYCERIN 0.4 MG SL SUBL: 0.4000 mg | SUBLINGUAL_TABLET | SUBLINGUAL | Status: DC | PRN

## 2015-01-08 MED ORDER — HEPARIN SODIUM (PORCINE) 1000 UNIT/ML IJ SOLN
INTRAMUSCULAR | Status: AC
  Filled 2015-01-08: qty 1

## 2015-01-08 MED ORDER — MIDAZOLAM HCL 2 MG/2ML IJ SOLN
INTRAMUSCULAR | Status: AC
  Filled 2015-01-08: qty 4

## 2015-01-08 MED ORDER — RIVAROXABAN 20 MG PO TABS
20.0000 mg | ORAL_TABLET | Freq: Every day | ORAL | Status: DC
  Administered 2015-01-08: 20 mg via ORAL
  Filled 2015-01-08: qty 1

## 2015-01-08 MED ORDER — NITROGLYCERIN 1 MG/10 ML FOR IR/CATH LAB
INTRA_ARTERIAL | Status: DC | PRN
  Administered 2015-01-08: 10:00:00

## 2015-01-08 MED ORDER — SODIUM CHLORIDE 0.9 % IJ SOLN: 3.0000 mL | INTRAMUSCULAR | Status: DC | PRN

## 2015-01-08 MED ORDER — HEPARIN (PORCINE) IN NACL 2-0.9 UNIT/ML-% IJ SOLN
INTRAMUSCULAR | Status: AC
  Filled 2015-01-08: qty 1000

## 2015-01-08 MED ORDER — SODIUM CHLORIDE 0.9 % IV SOLN: 250.0000 mL | INTRAVENOUS | Status: DC | PRN

## 2015-01-08 MED ORDER — ACETAMINOPHEN 325 MG PO TABS: 650.0000 mg | ORAL_TABLET | ORAL | Status: DC | PRN

## 2015-01-08 MED ORDER — FENTANYL CITRATE (PF) 100 MCG/2ML IJ SOLN
INTRAMUSCULAR | Status: AC
  Filled 2015-01-08: qty 4

## 2015-01-08 MED ORDER — SODIUM CHLORIDE 0.9 % WEIGHT BASED INFUSION
3.0000 mL/kg/h | INTRAVENOUS | Status: AC
  Administered 2015-01-08: 3 mL/kg/h via INTRAVENOUS

## 2015-01-08 MED ORDER — ASPIRIN EC 81 MG PO TBEC: 81.0000 mg | DELAYED_RELEASE_TABLET | Freq: Every day | ORAL | Status: DC

## 2015-01-08 MED ORDER — NITROGLYCERIN 1 MG/10 ML FOR IR/CATH LAB
INTRA_ARTERIAL | Status: AC
  Filled 2015-01-08: qty 10

## 2015-01-08 MED ORDER — HEPARIN SODIUM (PORCINE) 1000 UNIT/ML IJ SOLN
INTRAMUSCULAR | Status: DC | PRN
  Administered 2015-01-08: 5000 [IU] via INTRAVENOUS

## 2015-01-08 MED ORDER — FENTANYL CITRATE (PF) 100 MCG/2ML IJ SOLN
INTRAMUSCULAR | Status: DC | PRN
  Administered 2015-01-08: 25 ug via INTRAVENOUS

## 2015-01-08 MED ORDER — VERAPAMIL HCL 2.5 MG/ML IV SOLN
INTRAVENOUS | Status: DC | PRN
  Administered 2015-01-08: 10 mL via INTRA_ARTERIAL

## 2015-01-08 MED ORDER — SODIUM CHLORIDE 0.9 % IJ SOLN: 3.0000 mL | Freq: Two times a day (BID) | INTRAMUSCULAR | Status: DC

## 2015-01-08 MED ORDER — VERAPAMIL HCL 2.5 MG/ML IV SOLN
INTRAVENOUS | Status: AC
  Filled 2015-01-08: qty 2

## 2015-01-08 SURGICAL SUPPLY — 12 items
CATH INFINITI 5 FR JL3.5 (CATHETERS) ×2 IMPLANT
CATH INFINITI 5FR ANG PIGTAIL (CATHETERS) ×2 IMPLANT
CATH INFINITI JR4 5F (CATHETERS) ×2 IMPLANT
DEVICE RAD COMP TR BAND LRG (VASCULAR PRODUCTS) ×2 IMPLANT
GLIDESHEATH SLEND SS 6F .021 (SHEATH) ×2 IMPLANT
KIT HEART LEFT (KITS) ×2 IMPLANT
PACK CARDIAC CATHETERIZATION (CUSTOM PROCEDURE TRAY) ×2 IMPLANT
SYR MEDRAD MARK V 150ML (SYRINGE) ×2 IMPLANT
TRANSDUCER W/STOPCOCK (MISCELLANEOUS) ×2 IMPLANT
TUBING CIL FLEX 10 FLL-RA (TUBING) ×2 IMPLANT
WIRE HI TORQ VERSACORE-J 145CM (WIRE) ×1 IMPLANT
WIRE SAFE-T 1.5MM-J .035X260CM (WIRE) ×2 IMPLANT

## 2015-01-08 NOTE — Progress Notes (Signed)
TR band removed at 12:40. Site is a level 0. VSS. No bruising or bleeding. Dressing placed. Pat educated on arm restrictions. Will continue to monitor.   Ruben Reason, RN

## 2015-01-08 NOTE — Discharge Instructions (Signed)
Angina Pectoris °Angina pectoris is a very bad feeling in the chest, neck, or arm. Your doctor may call it angina. There are four types of angina. Angina is caused by a lack of blood in the middle and thickest layer of the heart wall (myocardium). Angina may feel like a crushing or squeezing pain in the chest. It may feel like tightness or heavy pressure in the chest. Some people say it feels like gas, heartburn, or indigestion. Some people have symptoms other than pain. These include: °· Shortness of breath. °· Cold sweats. °· Feeling sick to your stomach (nausea). °· Feeling light-headed. °Many women have chest discomfort and some of the other symptoms. However, women often have different symptoms, such as: °· Feeling tired (fatigue). °· Feeling nervous for no reason. °· Feeling weak for no reason. °· Dizziness or fainting. °Women may have angina without any symptoms. °HOME CARE °· Take medicines only as told by your doctor. °· Take care of other health issues as told by your doctor. These include: °¨ High blood pressure (hypertension). °¨ Diabetes. °· Follow a heart-healthy diet. Your doctor can help you to choose healthy food options and make changes. °· Talk to your doctor to learn more about healthy cooking methods and use them. These include: °¨ Roasting. °¨ Grilling. °¨ Broiling. °¨ Baking. °¨ Poaching. °¨ Steaming. °¨ Stir-frying. °· Follow an exercise program approved by your doctor. °· Keep a healthy weight. Lose weight as told by your doctor. °· Rest when you are tired. °· Learn to manage stress. °· Do not use any tobacco, such as cigarettes, chewing tobacco, or electronic cigarettes. If you need help quitting, ask your doctor. °· If you drink alcohol, and your doctor says it is okay, limit yourself to no more than 1 drink per day. One drink equals 12 ounces of beer, 5 ounces of wine, or 1½ ounces of hard liquor. °· Stop illegal drug use. °· Keep all follow-up visits as told by your doctor. This is  important. °Do not take these medicines unless your doctor says that you can: °· Nonsteroidal anti-inflammatory drugs (NSAIDs). These include: °¨ Ibuprofen. °¨ Naproxen. °¨ Celecoxib. °· Vitamin supplements that have vitamin A, vitamin E, or both. °· Hormone therapy that contains estrogen with or without progestin. °GET HELP RIGHT AWAY IF: °· You have pain in your chest, neck, arm, jaw, stomach, or back that: °¨ Lasts more than a few minutes. °¨ Comes back. °¨ Does not get better after you take medicine under your tongue (sublingual nitroglycerin). °· You have any of these symptoms for no reason: °¨ Gas, heartburn, or indigestion. °¨ Sweating a lot. °¨ Shortness of breath or trouble breathing. °¨ Feeling sick to your stomach or throwing up. °¨ Feeling more tired than usual. °¨ Feeling nervous or worrying more than usual. °¨ Feeling weak. °¨ Diarrhea. °· You are suddenly dizzy or light-headed. °· You faint or pass out. °These symptoms may be an emergency. Do not wait to see if the symptoms will go away. Get medical help right away. Call your local emergency services (911 in the [place] Do not drive yourself to the hospital. °  °This information is not intended to replace advice given to you by your health care provider. Make sure you discuss any questions you have with your health care provider. °  °Document Released: 08/16/2007 Document Revised: 07/14/2014 Document Reviewed: 07/01/2013 °Elsevier Interactive Patient Education ©2016 [employer] ° °

## 2015-01-08 NOTE — Progress Notes (Signed)
ANTICOAGULATION CONSULT NOTE - Initial Consult  Pharmacy Consult for heparin Indication: chest pain/ACS  No Known Allergies  Patient Measurements: Height: 5\' 4"  (162.6 cm) Weight: 251 lb 9.6 oz (114.125 kg) IBW/kg (Calculated) : 54.7 Heparin Dosing Weight: 82.1kg  Vital Signs: Temp: 98.8 F (37.1 C) (10/27 2140) Temp Source: Oral (10/27 2140) BP: 146/60 mmHg (10/27 2140) Pulse Rate: 68 (10/27 2140)  Labs:  Recent Labs  01/06/15 1550 01/06/15 2125 01/07/15 0311 01/07/15 0907 01/07/15 1112 01/07/15 1901 01/08/15 0343  HGB 16.5*  --  13.8  --   --   --  13.3  HCT 49.7*  --  42.7  --   --   --  41.3  PLT 307  --  269  --   --   --  240  APTT  --   --   --   --  104* 26  --   LABPROT  --   --   --   --  14.8  --   --   INR  --   --   --   --  1.15  --   --   HEPARINUNFRC  --   --   --   --  0.71* <0.10* 0.25*  CREATININE 1.07*  --  1.09*  --   --   --   --   TROPONINI <0.03 <0.03 <0.03 <0.03  --   --   --     Estimated Creatinine Clearance: 63.8 mL/min (by C-G formula based on Cr of 1.09).   Medical History: Past Medical History  Diagnosis Date  . Depression   . Hypertension   . DVT (deep vein thrombosis) in pregnancy   . PE (pulmonary embolism)     Medications:  Prescriptions prior to admission  Medication Sig Dispense Refill Last Dose  . allopurinol (ZYLOPRIM) 100 MG tablet Take 100 mg by mouth daily.   01/06/2015 at Unknown time  . diazepam (VALIUM) 2 MG tablet Take 2 mg by mouth 3 (three) times daily as needed for anxiety.   01/05/2015 at Unknown time  . gabapentin (NEURONTIN) 600 MG tablet Take 600 mg by mouth at bedtime.   01/05/2015 at Unknown time  . lisinopril-hydrochlorothiazide (PRINZIDE,ZESTORETIC) 20-25 MG per tablet Take 1 tablet by mouth daily.     01/06/2015 at Unknown time  . oxyCODONE-acetaminophen (PERCOCET) 10-325 MG per tablet Take 0.5-1 tablets by mouth every 8 (eight) hours as needed for pain.    01/05/2015 at Unknown time  . rivaroxaban  (XARELTO) 20 MG TABS tablet Take 20 mg by mouth daily with supper.   01/05/2015 at 2130  . simvastatin (ZOCOR) 20 MG tablet Take 20 mg by mouth every evening.   01/05/2015 at Unknown time  . Vortioxetine HBr 10 MG TABS Take 10 mg by mouth every evening.    Past Week at Unknown time  . HYDROcodone-acetaminophen (NORCO/VICODIN) 5-325 MG per tablet Take 2 tablets by mouth every 4 (four) hours as needed. (Patient not taking: Reported on 01/06/2015) 10 tablet 0     Assessment: 64 y.o.female with hx of hypertension, hyperlipidemia, PE with DVT on Xarelto, and tobacco abuse presents to ER with complaints of chest pain. Pt has been feeling weak and tired with a pinching in her left chest and left arm tightness. Also noted with nausea and vomiting and dyspnea. Initiaing heparin possible ACS. Last dose of xarelto 10/25 in the evening - appts and HL begun correlating, have been discontinued. HL low, will increase  rate.  Goal of Therapy:  Heparin level 0.3-0.7 units/ml Monitor platelets by anticoagulation protocol: Yes   Plan:  -Increase heparin to 1400 units/hr -Daily HL, CBC  -Check level in 6 hours   Hughes Better, PharmD, BCPS Clinical Pharmacist Pager: [phone] 01/08/2015 4:31 AM

## 2015-01-08 NOTE — H&P (View-Only) (Signed)
CARDIOLOGY CONSULT NOTE   Patient ID: LAKEVA HOLLON MRN: [id number] DOB/AGE: [birth year] 64 y.o.  Admit Date: 01/06/2015 Referring Physician: PTH-Chui Primary Physician: Jana Half Consulting Cardiologist: Kate Sable MD Primary Cardiologist:  Reason for Consultation: Chest Pain  Clinical Summary Ms. 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 She states that she has been feeling weak and tired for the last several week,over the last few days she has felt like she is carrying a log across her shoulders, and has had intermittent severe "pinching feeling" in her left chest, and left arm tightness that is coming and going.Nausea and vomiting, with some dyspnea.    She has a very strong family history with both parents dying from MI, brother who had 3 MI between the age of 43-43, and subsequent CABG, a second brother with CABG and a third brother just released from hospital with CABG.   In ER she was found to be hypertensive, HR 76, R-16, afebrile. Creatinine 1.07, (baseline similar to previous results), Potassium 3.6, not anemic, but leukocytosis was noted, CT was negative for PE, CXR negative for CHF or pneumonia. EKG NSR with poor R wave progression. Troponin negative X 3. Due to CVRF we are asked to evaluate chest pain.  She states that if given the option to have cath she want to have it. She is currently pain free.    No Known Allergies  Medications Scheduled Medications: . allopurinol  100 mg Oral Daily  . aspirin EC  325 mg Oral Daily  . gabapentin  600 mg Oral QHS  . [START ON 01/08/2015] Influenza vac split quadrivalent PF  0.5 mL Intramuscular Tomorrow-1000  . nicotine  14 mg Transdermal Daily  . nitroGLYCERIN  0.5 inch Topical 4 times per day  . rivaroxaban  20 mg Oral Q supper  . simvastatin  20 mg Oral QPM  . sodium chloride  3 mL Intravenous  Q12H  . sodium chloride  3 mL Intravenous Q12H      PRN Medications: sodium chloride, acetaminophen **OR** acetaminophen, alum & mag hydroxide-simeth, diazepam, HYDROmorphone (DILAUDID) injection, ondansetron **OR** ondansetron (ZOFRAN) IV, oxyCODONE, sodium chloride   Past Medical History  Diagnosis Date  . Depression   . Hypertension   . DVT (deep vein thrombosis) in pregnancy   . PE (pulmonary embolism)     Past Surgical History  Procedure Laterality Date  . Tonsillectomy    . Abdominal hysterectomy      Family History  Problem Relation Age of Onset  . Heart attack Mother   . Heart attack Father   . Heart attack Brother     CABG  . Heart attack Brother     CABG  . Heart attack Brother     CABG    Social History Ms. Camire reports that she has been smoking Cigarettes.  She has been smoking about 1.00 pack per day. She does not have any smokeless tobacco history on file. Ms. Ritchie reports that she does not drink alcohol.  Review of Systems Complete review of systems are found to be negative unless outlined in H&P above.  Physical Examination Blood pressure 131/58, pulse 67, temperature 98.2 F (36.8 C), temperature source Oral, resp. rate 18, height 5\' 4"  (1.626 m), weight 251 lb 4.8 oz (113.989 kg), SpO2 98 %.  Intake/Output Summary (Last 24 hours) at 01/07/15 0953 Last data filed at 01/07/15 [phone]  Gross per 24 hour  Intake      0 ml  Output      0 ml  Net      0 ml    Telemetry: NSR  GEN: No acute distress, obese.  HEENT: Conjunctiva and lids normal, oropharynx clear with moist mucosa. Neck: Supple, no elevated JVP or carotid bruits, no thyromegaly. Lungs: Some Cardiac: Regular rate and rhythm, no S3 or significant systolic murmur, no pericardial rub. Abdomen: Soft, nontender, no hepatomegaly, bowel sounds present, no guarding or rebound. Extremities: No pitting edema, distal pulses 2+. Skin: Warm and dry. Musculoskeletal: No  kyphosis. Neuropsychiatric: Alert and oriented x3, affect grossly appropriate.  Prior Cardiac Testing/Procedures  Lab Results  Basic Metabolic Panel:  Recent Labs Lab 01/06/15 1550 01/07/15 0311  NA 140 139  K 3.6 3.6  CL 101 102  CO2 27 27  GLUCOSE 111* 101*  BUN 10 11  CREATININE 1.07* 1.09*  CALCIUM 9.8 9.0    Liver Function Tests:  Recent Labs Lab 01/06/15 1550  AST 24  ALT 16  ALKPHOS 75  BILITOT 0.4  PROT 7.7  ALBUMIN 4.4    CBC:  Recent Labs Lab 01/06/15 1550 01/07/15 0311  WBC 11.1* 10.1  NEUTROABS 7.0  --   HGB 16.5* 13.8  HCT 49.7* 42.7  MCV 90.5 90.7  PLT 307 269    Cardiac Enzymes:  Recent Labs Lab 01/06/15 1550 01/06/15 2125 01/07/15 0311  TROPONINI <0.03 <0.03 <0.03    Radiology: Dg Chest 2 View  01/06/2015  CLINICAL DATA:  64 year old female with chest pain for several weeks. EXAM: CHEST  2 VIEW COMPARISON:  None. FINDINGS: The cardiomediastinal silhouette is unremarkable. There is no evidence of focal airspace disease, pulmonary edema, suspicious pulmonary nodule/mass, pleural effusion, or pneumothorax. No acute bony abnormalities are identified. IMPRESSION: No active cardiopulmonary disease. Electronically Signed   By: Margarette Canada M.D.   On: 01/06/2015 16:24   Ct Angio Chest Pe W/cm &/or Wo Cm  01/06/2015  CLINICAL DATA:  Recurrent chest pain and shortness of breath over last week. Clinical suspicion for pulmonary embolism. EXAM: CT ANGIOGRAPHY CHEST WITH CONTRAST TECHNIQUE: Multidetector CT imaging of the chest was performed using the standard protocol during bolus administration of intravenous contrast. Multiplanar CT image reconstructions and MIPs were obtained to evaluate the vascular anatomy. CONTRAST:  179mL OMNIPAQUE IOHEXOL 350 MG/ML SOLN COMPARISON:  None. FINDINGS: Mediastinum/Lymph Nodes: No pulmonary emboli or thoracic aortic dissection identified. No masses or pathologically enlarged lymph nodes identified. Lungs/Pleura:  No pulmonary mass, infiltrate, or effusion. Upper abdomen: No acute findings. Musculoskeletal: No chest wall mass or suspicious bone lesions identified. Review of the MIP images confirms the above findings. IMPRESSION: Negative. No evidence of pulmonary embolism or other active disease within the thorax. Electronically Signed   By: Earle Gell M.D.   On: 01/06/2015 18:02     ECG: NSR with poor R-wave progression.    Impression and Recommendations  1. Chest Pain: Worrisome for unstable angina with multiple CVRF to include FH, hypertension, hypercholesterolemia, tobacco abuse, and obesity. Troponin and EKG are negative arguing against ACS. She is pain free with NTG paste.  She is favoring cardiac cath over stress test. I would agree with this in light of her strong family history and risk factors. Will discuss further with Dr. Bronson Ing.   2. Hypertension: She was hypertensive on arrival but has gotten back to baseline.   3. Hypertriglyceridemia: TG 246. She is on simvastatin currently. May need to  check for diabetes.   4. 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 She has been on Xarelto since the summer, was on coumadin prior to this.   5. Tobacco Abuse: Smoking since the age of 49. Cessation is required. On Nicoderm patch. Some wheezing is noted.    Signed: Phill Myron. [place] NP [place]  01/07/2015, 9:53 AM Co-Sign MD  The patient was seen and examined, and I agree with the assessment and plan as documented above, with modifications as noted below. Patient with aforementioned history and presentation admitted with chest pain and has since ruled out for an ACS.  Has had chest pain (described as a "log on my chest") for the past 3 days, heaviness across shoulder blades, and left arm pain, accompanied by projectile vomiting. Denies diarrhea and abdominal pain. Also complains of progressive fatigue over the past month to the point where she now has to sit down to wash the dishes and can only do  half of them before becoming fatigued.  2 brothers had CABG and third brother about to have CABG (history of MI's in early 49's)  She has been smoking 1 ppd since age 60.  Symptoms were immediately relieved with nitroglycerin.  She has a high pretest probability for CAD and thus coronary angiography (rather than stress testing) is indicated.  I have discussed this with the patient and she is not only willing but is eager to proceed.  Will d/c Xarelto and start heparin and arrange for transfer to [facility].   Kate Sable, MD, [facility]  01/07/2015 10:28 AM

## 2015-01-08 NOTE — Interval H&P Note (Signed)
History and Physical Interval Note:  01/08/2015 10:10 AM  Jennifer [name]  has presented today for surgery, with the diagnosis of cp  The various methods of treatment have been discussed with the patient and family. After consideration of risks, benefits and other options for treatment, the patient has consented to  Procedure(s): Left Heart Cath and Coronary Angiography (N/A) and possible angioplasty as a surgical intervention .  The patient's history has been reviewed, patient examined, no change in status, stable for surgery.  I have reviewed the patient's chart and labs.  Questions were answered to the patient's satisfaction.     Timya Trimmer, Quillian Quince

## 2015-01-08 NOTE — Discharge Summary (Signed)
Discharge Summary   Patient ID: Jennifer [name],  MRN: [id number], DOB/AGE: 14-Aug-1950 64 y.o.  Admit date: 01/06/2015 Discharge date: 01/08/2015  Primary Care Provider: Delman Cheadle Primary Cardiologist: Dr. Bronson Ing  Discharge Diagnoses    Principal Problem:   Chest pain - etiology not totally clear, mild-moderate CAD noted on cath Active Problems:   Hypertension   Hyperlipidemia   H/o PE (pulmonary embolism)   Leukocytosis   Tobacco use disorder   H/o DVT (deep venous thrombosis) (HCC)   CAD in native artery   Allergies No Known Allergies  Diagnostic Studies/Procedures    1) Cardiac catheterization this admission, please see full report and above for summary. 2) 2D Echo 01/07/15 - Left ventricle: The cavity size was normal. Wall thickness was normal. Systolic function was normal. The estimated ejection fraction was in the range of 60% to 65%. Wall motion was normal; there were no regional wall motion abnormalities. Doppler parameters are consistent with abnormal left ventricular relaxation (grade 1 diastolic dysfunction). - Aortic valve: There was no stenosis. - Mitral valve: Mildly calcified annulus. There was no significant regurgitation. - Right ventricle: The cavity size was normal. Systolic function was normal. - Pulmonary arteries: No complete TR doppler jet so unable to estimate PA systolic pressure. - Inferior vena cava: The vessel was normal in size. The respirophasic diameter changes were in the normal range (>= 50%), consistent with normal central venous pressure. Impressions: - Normal LV size with EF 60-65%. Normal RV size and systolic function. No significant valvular abnormalities.  _____________   Hospital Course   Jennifer [name] is a 64 y/o HTN, HLD, previous PE/DVT on Xarelto, tobacco abuse, and family history of heart disease who presented to [facility] with chest pain. She reported feeling weak and tired for  the last several weeks. Over the last few days she felt like she was carrying a log across her shoulders. She had intermittent severe "pinching feeling" in her left chest and intermittent left arm tightness. She also had nausea, vomiting and some dyspnea. In ER she was found to be hypertensive, HR 76, afebrile, CR 1.07, K 3.6, minimal leukocytosis (11.1) without anemia. CTA was negative for PE or other active disease. CXR was negative for CHF or pneumonia. EKG showed NSR with poor R wave progression. Troponins were negative X 3 and BNP was normal. She reported a significant family history of CAD - 2 brothers had CABG and third brother about to have CABG (history of MI's in early 65's). Cardiology was asked to evaluate. Tobacco cessation advised. 2D echo 01/07/15: EF 60-65%, grade 1 DD. For definitive diagnosis, cardiac cath was recommended showing mild-moderate non-obstructive CAD with 60% mid RCA, 50% mid Cx-distal Cx, 30% distal LAD, 25% D2, normal LV function EF 55-65%. Her leukocytosis has resolved. We will arrange a post-cath visit. Dr. Haroldine Laws has seen and examined the patient today and feels she is stable for discharge. Per discussion with MD, she is OK to restart Xarelto this evening, and will be switching Zocor to atorvastatin for further risk reduction (LDL 120). Dr. Haroldine Laws has elected to continue baby aspirin. She was instructed to watch for bleeding - has not experienced this. Her BP was controlled yesterday on home regimen - it is modestly elevated this afternoon but she did not get her lisinopril/HCTZ due to going for cath. This was given post-cath. She was asked to follow BP at home and call if running >130/80.  If the patient is tolerating statin at time of follow-up  appointment, would consider rechecking liver function/lipid panel in 6-8 weeks.  Consultants: IM _____________  Discharge Vitals Blood pressure 144/55, pulse 73, temperature 98.6 F (37 C), temperature source Oral, resp.  rate 24, height 5\' 4"  (1.626 m), weight 251 lb 9.6 oz (114.125 kg), SpO2 97 %.  Filed Weights   01/06/15 2051 01/07/15 1307  Weight: 251 lb 4.8 oz (113.989 kg) 251 lb 9.6 oz (114.125 kg)   _____________  Labs     CBC  Recent Labs  01/06/15 1550 01/07/15 0311 01/08/15 0343  WBC 11.1* 10.1 8.2  NEUTROABS 7.0  --   --   HGB 16.5* 13.8 13.3  HCT 49.7* 42.7 41.3  MCV 90.5 90.7 89.8  PLT 307 269 025   Basic Metabolic Panel  Recent Labs  01/06/15 1550 01/07/15 0311  NA 140 139  K 3.6 3.6  CL 101 102  CO2 27 27  GLUCOSE 111* 101*  BUN 10 11  CREATININE 1.07* 1.09*  CALCIUM 9.8 9.0   Liver Function Tests  Recent Labs  01/06/15 1550  AST 24  ALT 16  ALKPHOS 75  BILITOT 0.4  PROT 7.7  ALBUMIN 4.4   Cardiac Enzymes  Recent Labs  01/06/15 2125 01/07/15 0311 01/07/15 0907  TROPONINI <0.03 <0.03 <0.03   Fasting Lipid Panel  Recent Labs  01/07/15 0311  CHOL 196  HDL 27*  LDLCALC 120*  TRIG 246*  CHOLHDL 7.3  _____________  Disposition   Pt is being discharged home today in good condition. _____________  Follow-up Plans & Appointments    Follow-up Information    Follow up with Jory Sims, NP.   Specialties:  Nurse Practitioner, Radiology, Cardiology   Why:  [facility] [place] - 01/15/15 at 1:30pm   Contact information:   [place] [address] [phone]      Discharge Instructions    Diet - low sodium heart healthy    Complete by:  As directed      Increase activity slowly    Complete by:  As directed   - You were started on a baby aspirin 81mg  daily. Please monitor for any unusual bleeding and call your doctor if you experience this (like blood in stool, black stool, blood in urine, etc). - Your simvastatin was changed to atorvastatin since your cholesterol was too high. - Talk to the doctor who prescribed you vortioxetine and let them know you stopped taking it in case they plan to do something different  with this medicine. - Please monitor your blood pressure occasionally at home. Call your doctor if you tend to get readings of greater than 130 on the top number or 80 on the bottom number.  No driving for 2 days. No lifting over 5 lbs for 1 week. No sexual activity for 1 week.  Keep procedure site clean & dry. If you notice increased pain, swelling, bleeding or pus, call/return!  You may shower, but no soaking baths/hot tubs/pools for 1 week.           Discharge Medications   Current Discharge Medication List    START taking these medications   Details  aspirin EC 81 MG tablet Take 1 tablet (81 mg total) by mouth daily. Qty: 30 tablet, Refills: 11    atorvastatin (LIPITOR) 80 MG tablet Take 1 tablet (80 mg total) by mouth every evening. Qty: 30 tablet, Refills: 6    nitroGLYCERIN (NITROSTAT) 0.4 MG SL tablet Place 1 tablet (0.4 mg total) under  the tongue every 5 (five) minutes as needed for chest pain (up to 3 doses). Qty: 25 tablet, Refills: 3      CONTINUE these medications which have NOT CHANGED   Details  allopurinol (ZYLOPRIM) 100 MG tablet Take 100 mg by mouth daily.    diazepam (VALIUM) 2 MG tablet Take 2 mg by mouth 3 (three) times daily as needed for anxiety.    gabapentin (NEURONTIN) 600 MG tablet Take 600 mg by mouth at bedtime.    lisinopril-hydrochlorothiazide (PRINZIDE,ZESTORETIC) 20-25 MG per tablet Take 1 tablet by mouth daily.      oxyCODONE-acetaminophen (PERCOCET) 10-325 MG per tablet Take 0.5-1 tablets by mouth every 8 (eight) hours as needed for pain.     rivaroxaban (XARELTO) 20 MG TABS tablet Take 20 mg by mouth daily with supper.      STOP taking these medications     simvastatin (ZOCOR) 20 MG tablet      Vortioxetine HBr 10 MG TABS - patient was no longer taking before admission      HYDROcodone-acetaminophen (NORCO/VICODIN) 5-325 MG per tablet -  patient was no longer taking before admission         _____________   Outstanding  Labs/Studies   See above  Duration of Discharge Encounter   Greater than 30 minutes including physician time.  Signed, Melina Copa PA-C 01/08/2015, 2:49 PM   Patient seen and examined with Melina Copa, PA-C. We discussed all aspects of the encounter. I agree with the assessment and plan as stated above.   Cath with non-obstructive CAD. Will need aggressive RF management. Can go home today on above regimen.   Bensimhon, Daniel,MD 12:27 PM

## 2015-01-08 NOTE — Progress Notes (Signed)
Clarify with dr.  Philbert Riser about patient's IVF running at the rate of pre cath order. Verbalized okay to run the current rate since patients kidney function within normal limit.

## 2015-01-11 ENCOUNTER — Other Ambulatory Visit: Payer: Self-pay | Admitting: *Deleted

## 2015-01-11 ENCOUNTER — Telehealth: Payer: Self-pay | Admitting: Cardiovascular Disease

## 2015-01-11 NOTE — Telephone Encounter (Signed)
D/C phone call .Marland Kitchen Appt is on 01/15/15 at 1:30pm w/ Jory Sims at the [facility]

## 2015-01-11 NOTE — Telephone Encounter (Signed)
Leave message for pt to call back 

## 2015-01-13 NOTE — Telephone Encounter (Signed)
Patient contacted regarding discharge from [facility] on 01/08/2015.  Patient understands to follow up with provider Jory Sims on 01/14/2013 at 1:30 at [facility]. Patient understands discharge instructions? Yes Patient understands medications and regiment? Yes Patient understands to bring all medications to this visit? Yes

## 2015-01-14 DIAGNOSIS — I10 Essential (primary) hypertension: Secondary | ICD-10-CM | POA: Diagnosis not present

## 2015-01-14 DIAGNOSIS — Z6841 Body Mass Index (BMI) 40.0 and over, adult: Secondary | ICD-10-CM | POA: Diagnosis not present

## 2015-01-14 DIAGNOSIS — M4806 Spinal stenosis, lumbar region: Secondary | ICD-10-CM | POA: Diagnosis not present

## 2015-01-15 ENCOUNTER — Ambulatory Visit (INDEPENDENT_AMBULATORY_CARE_PROVIDER_SITE_OTHER): Payer: Medicare Other | Admitting: Adult Health

## 2015-01-15 ENCOUNTER — Encounter: Payer: Self-pay | Admitting: Adult Health

## 2015-01-15 VITALS — BP 124/76 | HR 79 | Ht 65.0 in | Wt 249.4 lb

## 2015-01-15 DIAGNOSIS — I82402 Acute embolism and thrombosis of unspecified deep veins of left lower extremity: Secondary | ICD-10-CM | POA: Diagnosis not present

## 2015-01-15 DIAGNOSIS — I2510 Atherosclerotic heart disease of native coronary artery without angina pectoris: Secondary | ICD-10-CM | POA: Diagnosis not present

## 2015-01-15 DIAGNOSIS — E7800 Pure hypercholesterolemia, unspecified: Secondary | ICD-10-CM

## 2015-01-15 DIAGNOSIS — Z720 Tobacco use: Secondary | ICD-10-CM

## 2015-01-15 NOTE — Progress Notes (Signed)
Cardiology Office Note   Date:  01/15/2015   ID:  KIMBLE DELAURENTIS, DOB [birth year], MRN [id number]  PCP:  Jana Half  Cardiologist:  Woodroe Chen, NP   No chief complaint on file.     History of Present Illness: Jennifer [name] is a 64 y.o. female who presents for ongoing assessment and management of HTN, HLD, previous PE/DVT on Xarelto, tobacco abuse, with recent hospitalization at [facility] for chest pain and heaviness in her shoulders. She was originally seen at [facility]. With multiple risk factors she was transferred for cardiac cath.   Cardiac cath was recommended showing mild-moderate non-obstructive CAD with 60% mid RCA, 50% mid Cx-distal Cx, 30% distal LAD, 25% D2, normal LV function EF 55-65%.She was restarted on Xarelto  and will be switching Zocor to atorvastatin for further risk reduction (LDL 120).   She is doing well and without complaints. She is ready to quit smoking and increase her exercise. She is medically compliant.    Past Medical History  Diagnosis Date  . Depression   . Hypertension   . DVT (deep venous thrombosis) ([place])   . PE (pulmonary embolism)   . Hyperlipidemia   . Tobacco abuse   . CAD (coronary artery disease)     a. Cath 12/2014 - mild-moderate non-obstructive CAD with 60% mid RCA, 50% mid Cx-distal Cx, 30% distal LAD, 25% D2, EF 55-65%.    Past Surgical History  Procedure Laterality Date  . Tonsillectomy    . Abdominal hysterectomy    . Cardiac catheterization N/A 01/08/2015    Procedure: Left Heart Cath and Coronary Angiography;  Surgeon: Jolaine Artist, MD;  Location: [place] [facility];  Service: Cardiovascular;  Laterality: N/A;     Current Outpatient Prescriptions  Medication Sig Dispense Refill  . allopurinol (ZYLOPRIM) 100 MG tablet Take 100 mg by mouth daily.    Marland Kitchen aspirin EC 81 MG tablet Take 1 tablet (81 mg total) by mouth daily. 30 tablet 11  . atorvastatin (LIPITOR) 80 MG tablet Take 1 tablet (80 mg total) by  mouth every evening. 30 tablet 6  . diazepam (VALIUM) 2 MG tablet Take 2 mg by mouth 3 (three) times daily as needed for anxiety.    . gabapentin (NEURONTIN) 600 MG tablet Take 600 mg by mouth at bedtime.    Marland Kitchen lisinopril-hydrochlorothiazide (PRINZIDE,ZESTORETIC) 20-25 MG per tablet Take 1 tablet by mouth daily.      . nitroGLYCERIN (NITROSTAT) 0.4 MG SL tablet Place 1 tablet (0.4 mg total) under the tongue every 5 (five) minutes as needed for chest pain (up to 3 doses). 25 tablet 3  . oxyCODONE-acetaminophen (PERCOCET) 10-325 MG per tablet Take 0.5-1 tablets by mouth every 8 (eight) hours as needed for pain.     . rivaroxaban (XARELTO) 20 MG TABS tablet Take 20 mg by mouth daily with supper.     No current facility-administered medications for this visit.    Allergies:   Review of patient's allergies indicates no known allergies.    Social History:  The patient  reports that she has been smoking Cigarettes.  She has been smoking about 1.00 pack per day. She does not have any smokeless tobacco history on file. She reports that she does not drink alcohol or use illicit drugs.   Family History:  The patient's family history includes Heart attack in her brother, brother, brother, father, and mother.    ROS: All other systems are reviewed and negative. Unless otherwise mentioned in H&P  PHYSICAL EXAM: VS:  There were no vitals taken for this visit. , BMI There is no weight on file to calculate BMI. GEN: Well nourished, well developed, in no acute distress HEENT: normal Neck: no JVD, carotid bruits, or masses Cardiac: RRR; no murmurs, rubs, or gallops,no edema  Respiratory:  Wheezing is noted. No crackles.  GI: soft, nontender, nondistended, + BS MS: no deformity or atrophy Skin: warm and dry, no rash Neuro:  Strength and sensation are intact Psych: euthymic mood, full affect    Recent Labs: 01/06/2015: ALT 16; B Natriuretic Peptide 19.0 01/07/2015: BUN 11; Creatinine, Ser 1.09*;  Potassium 3.6; Sodium 139 01/08/2015: Hemoglobin 13.3; Platelets 240    Lipid Panel    Component Value Date/Time   CHOL 196 01/07/2015 0311   TRIG 246* 01/07/2015 0311   HDL 27* 01/07/2015 0311   CHOLHDL 7.3 01/07/2015 0311   VLDL 49* 01/07/2015 0311   LDLCALC 120* 01/07/2015 0311      Wt Readings from Last 3 Encounters:  01/07/15 251 lb 9.6 oz (114.125 kg)  10/08/14 265 lb (120.203 kg)  05/17/14 250 lb (113.399 kg)     ASSESSMENT AND PLAN:  1. CAD: S/P cardiac cath with 60% RCA, and 50% mid and 30% distal Cx and 25% distal LAD. She is counseled on increasing exercise, reducing cholesterol and medication adherence. She is to follow up in 3 months.   2. Tobacco abuse: She has been smoking since she was a teenager. She is interested in Chantix. We do not have samples and she is to see if PCP has them. If not, we will write Rx. She is encouraged to quit as this is a significant CVRF.   3. Obesity: She is advised on increased exercise. She is to begin slowly and work her way up to prevent soreness and fatigue.   4. Hypercholesterolemia; She is to take only the atorvastatin and not simvastatin as well. She verbalizes understanding.   5. Hx of DVT: She is given samples of Xarelto.    Current medicines are reviewed at length with the patient today.    Labs/ tests ordered today include: None No orders of the defined types were placed in this encounter.     Disposition:   FU with cardiology in 3 months.   Signed, Jory Sims, NP  01/15/2015 7:03 AM    [facility] [address], [place], [place] [address] Phone: [phone]; Fax: [phone]

## 2015-01-15 NOTE — Progress Notes (Deleted)
Name: Jennifer [name]    DOB: [birth year]  Age: 64 y.o.  MR#: [id number]       PCP:  Jana Half      Insurance: Payor: [employer] MEDICARE / Plan: UHC MEDICARE / Product Type: *No Product type* /   CC:   No chief complaint on file.   VS Filed Vitals:   01/15/15 1311  BP: 124/76  Pulse: 79  Height: 5\' 5"  (1.651 m)  Weight: 249 lb 6.4 oz (113.127 kg)  SpO2: 96%    Weights Current Weight  01/15/15 249 lb 6.4 oz (113.127 kg)  01/07/15 251 lb 9.6 oz (114.125 kg)  10/08/14 265 lb (120.203 kg)    Blood Pressure  BP Readings from Last 3 Encounters:  01/15/15 124/76  01/08/15 144/55  10/08/14 168/72     Admit date:  (Not on file) Last encounter with RMR:  Visit date not found   Allergy Review of patient's allergies indicates no known allergies.  Current Outpatient Prescriptions  Medication Sig Dispense Refill  . aspirin EC 81 MG tablet Take 1 tablet (81 mg total) by mouth daily. 30 tablet 11  . atorvastatin (LIPITOR) 80 MG tablet Take 1 tablet (80 mg total) by mouth every evening. 30 tablet 6  . diazepam (VALIUM) 2 MG tablet Take 2 mg by mouth 3 (three) times daily as needed for anxiety.    . gabapentin (NEURONTIN) 600 MG tablet Take 600 mg by mouth at bedtime.    . lansoprazole (PREVACID) 15 MG capsule Take 15 mg by mouth daily at 12 noon.    Marland Kitchen lisinopril-hydrochlorothiazide (PRINZIDE,ZESTORETIC) 20-25 MG per tablet Take 1 tablet by mouth daily.      . nitroGLYCERIN (NITROSTAT) 0.4 MG SL tablet Place 1 tablet (0.4 mg total) under the tongue every 5 (five) minutes as needed for chest pain (up to 3 doses). 25 tablet 3  . oxyCODONE-acetaminophen (PERCOCET) 10-325 MG per tablet Take 0.5-1 tablets by mouth every 8 (eight) hours as needed for pain.     . rivaroxaban (XARELTO) 20 MG TABS tablet Take 20 mg by mouth daily with supper.    . Vortioxetine HBr (TRINTELLIX) 10 MG TABS Take by mouth.     No current facility-administered medications for this visit.     Discontinued Meds:    Medications Discontinued During This Encounter  Medication Reason  . allopurinol (ZYLOPRIM) 100 MG tablet Error    Patient Active Problem List   Diagnosis Date Noted  . CAD in native artery 01/08/2015  . Chest pain 01/06/2015  . Hypertension 01/06/2015  . Hyperlipidemia 01/06/2015  . PE (pulmonary embolism) 01/06/2015  . Leukocytosis 01/06/2015  . Tobacco use disorder 01/06/2015  . DVT (deep venous thrombosis) ([place]) 01/06/2015  . DVT femoral (deep venous thrombosis) with thrombophlebitis ([facility])     LABS    Component Value Date/Time   NA 139 01/07/2015 0311   NA 140 01/06/2015 1550   NA 138 10/08/2014 1416   K 3.6 01/07/2015 0311   K 3.6 01/06/2015 1550   K 3.7 10/08/2014 1416   CL 102 01/07/2015 0311   CL 101 01/06/2015 1550   CL 100* 10/08/2014 1416   CO2 27 01/07/2015 0311   CO2 27 01/06/2015 1550   CO2 27 10/08/2014 1416   GLUCOSE 101* 01/07/2015 0311   GLUCOSE 111* 01/06/2015 1550   GLUCOSE 90 10/08/2014 1416   BUN 11 01/07/2015 0311   BUN 10 01/06/2015 1550   BUN 17 10/08/2014 1416   CREATININE  1.09* 01/07/2015 0311   CREATININE 1.07* 01/06/2015 1550   CREATININE 1.11* 10/08/2014 1416   CALCIUM 9.0 01/07/2015 0311   CALCIUM 9.8 01/06/2015 1550   CALCIUM 9.3 10/08/2014 1416   GFRNONAA 52* 01/07/2015 0311   GFRNONAA 53* 01/06/2015 1550   GFRNONAA 51* 10/08/2014 1416   GFRAA >60 01/07/2015 0311   GFRAA >60 01/06/2015 1550   GFRAA 59* 10/08/2014 1416   CMP     Component Value Date/Time   NA 139 01/07/2015 0311   K 3.6 01/07/2015 0311   CL 102 01/07/2015 0311   CO2 27 01/07/2015 0311   GLUCOSE 101* 01/07/2015 0311   BUN 11 01/07/2015 0311   CREATININE 1.09* 01/07/2015 0311   CALCIUM 9.0 01/07/2015 0311   PROT 7.7 01/06/2015 1550   ALBUMIN 4.4 01/06/2015 1550   AST 24 01/06/2015 1550   ALT 16 01/06/2015 1550   ALKPHOS 75 01/06/2015 1550   BILITOT 0.4 01/06/2015 1550   GFRNONAA 52* 01/07/2015 0311   GFRAA >60 01/07/2015  0311       Component Value Date/Time   WBC 8.2 01/08/2015 0343   WBC 10.1 01/07/2015 0311   WBC 11.1* 01/06/2015 1550   HGB 13.3 01/08/2015 0343   HGB 13.8 01/07/2015 0311   HGB 16.5* 01/06/2015 1550   HCT 41.3 01/08/2015 0343   HCT 42.7 01/07/2015 0311   HCT 49.7* 01/06/2015 1550   MCV 89.8 01/08/2015 0343   MCV 90.7 01/07/2015 0311   MCV 90.5 01/06/2015 1550    Lipid Panel     Component Value Date/Time   CHOL 196 01/07/2015 0311   TRIG 246* 01/07/2015 0311   HDL 27* 01/07/2015 0311   CHOLHDL 7.3 01/07/2015 0311   VLDL 49* 01/07/2015 0311   LDLCALC 120* 01/07/2015 0311    ABG No results found for: PHART, PCO2ART, PO2ART, HCO3, TCO2, ACIDBASEDEF, O2SAT   No results found for: TSH BNP (last 3 results)  Recent Labs  01/06/15 1515  BNP 19.0    ProBNP (last 3 results) No results for input(s): PROBNP in the last 8760 hours.  Cardiac Panel (last 3 results) No results for input(s): CKTOTAL, CKMB, TROPONINI, RELINDX in the last 72 hours.  Iron/TIBC/Ferritin/ %Sat No results found for: IRON, TIBC, FERRITIN, IRONPCTSAT   EKG Orders placed or performed during the hospital encounter of 01/06/15  . ED EKG  . ED EKG  . EKG 12-Lead  . EKG 12-Lead  . EKG     Prior Assessment and Plan Problem List as of 01/15/2015      Cardiovascular and Mediastinum   Hypertension   PE (pulmonary embolism)   DVT femoral (deep venous thrombosis) with thrombophlebitis (HCC)   DVT (deep venous thrombosis) (HCC)   CAD in native artery     Other   Chest pain   Hyperlipidemia   Leukocytosis   Tobacco use disorder       Imaging: Dg Chest 2 View  01/06/2015  CLINICAL DATA:  64 year old female with chest pain for several weeks. EXAM: CHEST  2 VIEW COMPARISON:  None. FINDINGS: The cardiomediastinal silhouette is unremarkable. There is no evidence of focal airspace disease, pulmonary edema, suspicious pulmonary nodule/mass, pleural effusion, or pneumothorax. No acute bony  abnormalities are identified. IMPRESSION: No active cardiopulmonary disease. Electronically Signed   By: Margarette Canada M.D.   On: 01/06/2015 16:24   Ct Angio Chest Pe W/cm &/or Wo Cm  01/06/2015  CLINICAL DATA:  Recurrent chest pain and shortness of breath over last week. Clinical  suspicion for pulmonary embolism. EXAM: CT ANGIOGRAPHY CHEST WITH CONTRAST TECHNIQUE: Multidetector CT imaging of the chest was performed using the standard protocol during bolus administration of intravenous contrast. Multiplanar CT image reconstructions and MIPs were obtained to evaluate the vascular anatomy. CONTRAST:  122mL OMNIPAQUE IOHEXOL 350 MG/ML SOLN COMPARISON:  None. FINDINGS: Mediastinum/Lymph Nodes: No pulmonary emboli or thoracic aortic dissection identified. No masses or pathologically enlarged lymph nodes identified. Lungs/Pleura: No pulmonary mass, infiltrate, or effusion. Upper abdomen: No acute findings. Musculoskeletal: No chest wall mass or suspicious bone lesions identified. Review of the MIP images confirms the above findings. IMPRESSION: Negative. No evidence of pulmonary embolism or other active disease within the thorax. Electronically Signed   By: Earle Gell M.D.   On: 01/06/2015 18:02

## 2015-01-15 NOTE — Patient Instructions (Signed)
Medication Instructions:  Continue to take the Atorvaststin Discontinue the Simvastatin  Labwork: none  Testing/Procedures: none  Follow-Up: Your physician recommends that you schedule a follow-up appointment in: 3 months with Jory Sims, NP   Any Other Special Instructions Will Be Listed Below (If Applicable). Call us on Tuesday to see if we have Chantix samples in Thanks for choosing [facility]!!!      If you need a refill on your cardiac medications before your next appointment, please call your pharmacy.

## 2015-01-19 ENCOUNTER — Telehealth: Payer: Self-pay | Admitting: Adult Health

## 2015-01-19 NOTE — Telephone Encounter (Signed)
Pt would like to know if she can get some patches to help her stop smoking, she uses [place].

## 2015-01-19 NOTE — Telephone Encounter (Signed)
Will forward to [facility]

## 2015-01-20 NOTE — Telephone Encounter (Signed)
Left pt a message on personal voicemail to come by office and pick up samples of chantix. Put samples in bag and put up front.

## 2015-01-21 NOTE — Telephone Encounter (Signed)
Ok

## 2015-02-11 DIAGNOSIS — Z23 Encounter for immunization: Secondary | ICD-10-CM | POA: Diagnosis not present

## 2015-02-11 DIAGNOSIS — Z6841 Body Mass Index (BMI) 40.0 and over, adult: Secondary | ICD-10-CM | POA: Diagnosis not present

## 2015-02-11 DIAGNOSIS — M7051 Other bursitis of knee, right knee: Secondary | ICD-10-CM | POA: Diagnosis not present

## 2015-02-11 DIAGNOSIS — I2510 Atherosclerotic heart disease of native coronary artery without angina pectoris: Secondary | ICD-10-CM | POA: Diagnosis not present

## 2015-02-11 DIAGNOSIS — Z1389 Encounter for screening for other disorder: Secondary | ICD-10-CM | POA: Diagnosis not present

## 2015-04-08 DIAGNOSIS — Z1389 Encounter for screening for other disorder: Secondary | ICD-10-CM | POA: Diagnosis not present

## 2015-04-08 DIAGNOSIS — G64 Other disorders of peripheral nervous system: Secondary | ICD-10-CM | POA: Diagnosis not present

## 2015-05-28 DIAGNOSIS — H2513 Age-related nuclear cataract, bilateral: Secondary | ICD-10-CM | POA: Diagnosis not present

## 2015-05-28 DIAGNOSIS — H40033 Anatomical narrow angle, bilateral: Secondary | ICD-10-CM | POA: Diagnosis not present

## 2015-06-10 DIAGNOSIS — Z1389 Encounter for screening for other disorder: Secondary | ICD-10-CM | POA: Diagnosis not present

## 2015-06-10 DIAGNOSIS — M545 Low back pain: Secondary | ICD-10-CM | POA: Diagnosis not present

## 2015-08-27 DIAGNOSIS — Z1389 Encounter for screening for other disorder: Secondary | ICD-10-CM | POA: Diagnosis not present

## 2015-08-27 DIAGNOSIS — F325 Major depressive disorder, single episode, in full remission: Secondary | ICD-10-CM | POA: Diagnosis not present

## 2015-08-27 DIAGNOSIS — Z6841 Body Mass Index (BMI) 40.0 and over, adult: Secondary | ICD-10-CM | POA: Diagnosis not present

## 2015-10-13 ENCOUNTER — Other Ambulatory Visit (HOSPITAL_COMMUNITY): Payer: Self-pay | Admitting: *Deleted

## 2015-10-13 MED ORDER — ATORVASTATIN CALCIUM 80 MG PO TABS: 80.0000 mg | ORAL_TABLET | Freq: Every evening | ORAL | 6 refills | Status: DC

## 2015-11-02 DIAGNOSIS — B9562 Methicillin resistant Staphylococcus aureus infection as the cause of diseases classified elsewhere: Secondary | ICD-10-CM | POA: Diagnosis not present

## 2015-11-02 DIAGNOSIS — M79672 Pain in left foot: Secondary | ICD-10-CM | POA: Diagnosis not present

## 2015-11-02 DIAGNOSIS — T07 Unspecified multiple injuries: Secondary | ICD-10-CM | POA: Diagnosis not present

## 2015-11-02 DIAGNOSIS — I10 Essential (primary) hypertension: Secondary | ICD-10-CM | POA: Diagnosis not present

## 2015-11-02 DIAGNOSIS — E782 Mixed hyperlipidemia: Secondary | ICD-10-CM | POA: Diagnosis not present

## 2015-11-24 DIAGNOSIS — L980 Pyogenic granuloma: Secondary | ICD-10-CM | POA: Diagnosis not present

## 2015-11-24 DIAGNOSIS — B078 Other viral warts: Secondary | ICD-10-CM | POA: Diagnosis not present

## 2015-11-24 DIAGNOSIS — M79672 Pain in left foot: Secondary | ICD-10-CM | POA: Diagnosis not present

## 2015-11-26 DIAGNOSIS — R0989 Other specified symptoms and signs involving the circulatory and respiratory systems: Secondary | ICD-10-CM | POA: Diagnosis not present

## 2015-11-26 DIAGNOSIS — I10 Essential (primary) hypertension: Secondary | ICD-10-CM | POA: Diagnosis not present

## 2015-11-26 DIAGNOSIS — I2699 Other pulmonary embolism without acute cor pulmonale: Secondary | ICD-10-CM | POA: Diagnosis not present

## 2015-12-03 ENCOUNTER — Other Ambulatory Visit (HOSPITAL_COMMUNITY): Payer: Self-pay | Admitting: Family Medicine

## 2015-12-03 DIAGNOSIS — R0989 Other specified symptoms and signs involving the circulatory and respiratory systems: Secondary | ICD-10-CM

## 2015-12-07 ENCOUNTER — Other Ambulatory Visit (HOSPITAL_COMMUNITY): Payer: Self-pay | Admitting: Family Medicine

## 2015-12-07 DIAGNOSIS — Z1231 Encounter for screening mammogram for malignant neoplasm of breast: Secondary | ICD-10-CM

## 2015-12-09 ENCOUNTER — Ambulatory Visit (HOSPITAL_COMMUNITY)
Admission: RE | Admit: 2015-12-09 | Discharge: 2015-12-09 | Disposition: A | Discharge status: Home / Self Care | Payer: Medicare Other | Source: Ambulatory Visit | Attending: Family Medicine | Admitting: Family Medicine

## 2015-12-09 DIAGNOSIS — I6523 Occlusion and stenosis of bilateral carotid arteries: Secondary | ICD-10-CM | POA: Insufficient documentation

## 2015-12-09 DIAGNOSIS — R0989 Other specified symptoms and signs involving the circulatory and respiratory systems: Secondary | ICD-10-CM | POA: Insufficient documentation

## 2015-12-09 DIAGNOSIS — Z1231 Encounter for screening mammogram for malignant neoplasm of breast: Secondary | ICD-10-CM | POA: Diagnosis not present

## 2015-12-15 DIAGNOSIS — B078 Other viral warts: Secondary | ICD-10-CM | POA: Diagnosis not present

## 2015-12-15 DIAGNOSIS — M79672 Pain in left foot: Secondary | ICD-10-CM | POA: Diagnosis not present

## 2015-12-21 ENCOUNTER — Other Ambulatory Visit: Payer: Self-pay

## 2015-12-21 DIAGNOSIS — I6522 Occlusion and stenosis of left carotid artery: Secondary | ICD-10-CM

## 2015-12-23 DIAGNOSIS — I739 Peripheral vascular disease, unspecified: Secondary | ICD-10-CM

## 2015-12-23 DIAGNOSIS — I509 Heart failure, unspecified: Secondary | ICD-10-CM | POA: Insufficient documentation

## 2015-12-23 DIAGNOSIS — Z6841 Body Mass Index (BMI) 40.0 and over, adult: Secondary | ICD-10-CM | POA: Insufficient documentation

## 2015-12-23 DIAGNOSIS — R0989 Other specified symptoms and signs involving the circulatory and respiratory systems: Secondary | ICD-10-CM | POA: Insufficient documentation

## 2015-12-23 DIAGNOSIS — F325 Major depressive disorder, single episode, in full remission: Secondary | ICD-10-CM | POA: Insufficient documentation

## 2015-12-29 DIAGNOSIS — I6523 Occlusion and stenosis of bilateral carotid arteries: Secondary | ICD-10-CM | POA: Diagnosis not present

## 2016-01-13 ENCOUNTER — Encounter (HOSPITAL_COMMUNITY): Payer: Medicare Other

## 2016-01-13 ENCOUNTER — Encounter: Payer: Medicare Other | Admitting: Vascular Surgery

## 2016-02-09 DIAGNOSIS — J040 Acute laryngitis: Secondary | ICD-10-CM | POA: Diagnosis not present

## 2016-02-09 DIAGNOSIS — R070 Pain in throat: Secondary | ICD-10-CM | POA: Diagnosis not present

## 2016-02-09 DIAGNOSIS — R6889 Other general symptoms and signs: Secondary | ICD-10-CM | POA: Diagnosis not present

## 2016-02-09 DIAGNOSIS — B349 Viral infection, unspecified: Secondary | ICD-10-CM | POA: Diagnosis not present

## 2016-02-09 DIAGNOSIS — J069 Acute upper respiratory infection, unspecified: Secondary | ICD-10-CM | POA: Diagnosis not present

## 2016-02-09 DIAGNOSIS — Z1389 Encounter for screening for other disorder: Secondary | ICD-10-CM | POA: Diagnosis not present

## 2016-02-09 DIAGNOSIS — J209 Acute bronchitis, unspecified: Secondary | ICD-10-CM | POA: Diagnosis not present

## 2016-02-24 DIAGNOSIS — I779 Disorder of arteries and arterioles, unspecified: Secondary | ICD-10-CM | POA: Diagnosis not present

## 2016-02-24 DIAGNOSIS — Z23 Encounter for immunization: Secondary | ICD-10-CM | POA: Diagnosis not present

## 2016-02-24 DIAGNOSIS — M545 Low back pain: Secondary | ICD-10-CM | POA: Diagnosis not present

## 2016-02-24 DIAGNOSIS — I2782 Chronic pulmonary embolism: Secondary | ICD-10-CM | POA: Diagnosis not present

## 2016-03-08 ENCOUNTER — Other Ambulatory Visit: Payer: Self-pay | Admitting: Pharmacist

## 2016-03-08 NOTE — Patient Outreach (Signed)
Outreach call to [facility] regarding medication adherence to atorvastatin. Left a HIPAA compliant message on the patient's voicemail.  Harlow Asa, PharmD, [facility] [phone]

## 2016-04-13 ENCOUNTER — Encounter (HOSPITAL_COMMUNITY): Payer: Self-pay | Admitting: *Deleted

## 2016-04-13 ENCOUNTER — Emergency Department (HOSPITAL_COMMUNITY): Payer: Medicare Other

## 2016-04-13 ENCOUNTER — Emergency Department (HOSPITAL_COMMUNITY)
Admission: EM | Admit: 2016-04-13 | Discharge: 2016-04-13 | Disposition: A | Discharge status: Home / Self Care | Payer: Medicare Other | Attending: Emergency Medicine | Admitting: Emergency Medicine

## 2016-04-13 DIAGNOSIS — Z7982 Long term (current) use of aspirin: Secondary | ICD-10-CM | POA: Insufficient documentation

## 2016-04-13 DIAGNOSIS — R1031 Right lower quadrant pain: Secondary | ICD-10-CM

## 2016-04-13 DIAGNOSIS — Z79899 Other long term (current) drug therapy: Secondary | ICD-10-CM | POA: Insufficient documentation

## 2016-04-13 DIAGNOSIS — I10 Essential (primary) hypertension: Secondary | ICD-10-CM | POA: Diagnosis not present

## 2016-04-13 DIAGNOSIS — R1030 Lower abdominal pain, unspecified: Secondary | ICD-10-CM | POA: Diagnosis present

## 2016-04-13 DIAGNOSIS — F17210 Nicotine dependence, cigarettes, uncomplicated: Secondary | ICD-10-CM | POA: Diagnosis not present

## 2016-04-13 DIAGNOSIS — I2510 Atherosclerotic heart disease of native coronary artery without angina pectoris: Secondary | ICD-10-CM | POA: Insufficient documentation

## 2016-04-13 LAB — CBC WITH DIFFERENTIAL/PLATELET
Basophils Absolute: 0 10*3/uL (ref 0.0–0.1)
Basophils Relative: 0 %
Eosinophils Absolute: 0.1 10*3/uL (ref 0.0–0.7)
Eosinophils Relative: 1 %
HCT: 43.4 % (ref 36.0–46.0)
Hemoglobin: 14.2 g/dL (ref 12.0–15.0)
Lymphocytes Relative: 36 %
Lymphs Abs: 3.2 10*3/uL (ref 0.7–4.0)
MCH: 29.2 pg (ref 26.0–34.0)
MCHC: 32.7 g/dL (ref 30.0–36.0)
MCV: 89.1 fL (ref 78.0–100.0)
Monocytes Absolute: 0.5 10*3/uL (ref 0.1–1.0)
Monocytes Relative: 6 %
Neutro Abs: 5.2 10*3/uL (ref 1.7–7.7)
Neutrophils Relative %: 57 %
Platelets: 302 10*3/uL (ref 150–400)
RBC: 4.87 MIL/uL (ref 3.87–5.11)
RDW: 14.6 % (ref 11.5–15.5)
WBC: 9 10*3/uL (ref 4.0–10.5)

## 2016-04-13 LAB — COMPREHENSIVE METABOLIC PANEL
ALT: 14 U/L (ref 14–54)
AST: 17 U/L (ref 15–41)
Albumin: 3.9 g/dL (ref 3.5–5.0)
Alkaline Phosphatase: 72 U/L (ref 38–126)
Anion gap: 8 (ref 5–15)
BUN: 8 mg/dL (ref 6–20)
CO2: 31 mmol/L (ref 22–32)
Calcium: 9.5 mg/dL (ref 8.9–10.3)
Chloride: 102 mmol/L (ref 101–111)
Creatinine, Ser: 0.98 mg/dL (ref 0.44–1.00)
GFR calc Af Amer: >60 mL/min (ref >60)
GFR calc non Af Amer: 59 mL/min — ABNORMAL LOW (ref >60)
Glucose, Bld: 102 mg/dL — ABNORMAL HIGH (ref 65–99)
Potassium: 3.8 mmol/L (ref 3.5–5.1)
Sodium: 141 mmol/L (ref 135–145)
Total Bilirubin: 0.5 mg/dL (ref 0.3–1.2)
Total Protein: 6.7 g/dL (ref 6.5–8.1)

## 2016-04-13 LAB — URINALYSIS, ROUTINE W REFLEX MICROSCOPIC
Bilirubin Urine: NEGATIVE
Glucose, UA: NEGATIVE mg/dL
Hgb urine dipstick: NEGATIVE
Ketones, ur: NEGATIVE mg/dL
Leukocytes, UA: NEGATIVE
Nitrite: NEGATIVE
Protein, ur: NEGATIVE mg/dL
Specific Gravity, Urine: 1.014 (ref 1.005–1.030)
pH: 5 (ref 5.0–8.0)

## 2016-04-13 LAB — LIPASE, BLOOD: Lipase: 17 U/L (ref 11–51)

## 2016-04-13 MED ORDER — HYDROCODONE-ACETAMINOPHEN 5-325 MG PO TABS: 1.0000 | ORAL_TABLET | Freq: Four times a day (QID) | ORAL | 0 refills | Status: DC | PRN

## 2016-04-13 MED ORDER — IOPAMIDOL (ISOVUE-300) INJECTION 61%
100.0000 mL | Freq: Once | INTRAVENOUS | Status: AC | PRN
  Administered 2016-04-13: 100 mL via INTRAVENOUS

## 2016-04-13 MED ORDER — ONDANSETRON HCL 4 MG/2ML IJ SOLN
4.0000 mg | Freq: Once | INTRAMUSCULAR | Status: AC
  Administered 2016-04-13: 4 mg via INTRAVENOUS
  Filled 2016-04-13: qty 2

## 2016-04-13 MED ORDER — HYDROMORPHONE HCL 1 MG/ML IJ SOLN
1.0000 mg | Freq: Once | INTRAMUSCULAR | Status: AC
  Administered 2016-04-13: 1 mg via INTRAVENOUS
  Filled 2016-04-13: qty 1

## 2016-04-13 MED ORDER — HYDROMORPHONE HCL 1 MG/ML IJ SOLN
0.5000 mg | Freq: Once | INTRAMUSCULAR | Status: AC
  Administered 2016-04-13: 0.5 mg via INTRAVENOUS
  Filled 2016-04-13: qty 1

## 2016-04-13 NOTE — ED Notes (Signed)
Pt reports that pain is unrelieved. Per dr Roderic Palau may give 1mg  dilaudid

## 2016-04-13 NOTE — ED Provider Notes (Signed)
[place] DEPT Provider Note   CSN: [id number] Arrival date & time: 04/13/16  [id number]   By signing my name below, I, Hilbert Odor, attest that this documentation has been prepared under the direction and in the presence of Milton Ferguson, MD. Electronically Signed: Hilbert Odor, Scribe. 04/13/16. 1:29 PM. History   Chief Complaint Chief Complaint  Patient presents with  . Groin Pain    HPI Comments: Jennifer [name] is a 66 y.o. female who presents to the Emergency Department complaining of groin pain since yesterday. Patient states that she has never had any pain like this before. She denies falling. She also denies fever, chills, swelling in legs, pain in legs, and difficulty urinating. She reports no alleviating factors.  The history is provided by the patient. No language interpreter was used.  Groin Pain  This is a new problem. The current episode started yesterday. The problem occurs constantly. Associated symptoms include abdominal pain. Pertinent negatives include no chest pain, no headaches and no shortness of breath. Nothing aggravates the symptoms. Nothing relieves the symptoms.      Past Medical History:  Diagnosis Date  . CAD (coronary artery disease)    a. Cath 12/2014 - mild-moderate non-obstructive CAD with 60% mid RCA, 50% mid Cx-distal Cx, 30% distal LAD, 25% D2, EF 55-65%.  . Depression   . DVT (deep venous thrombosis) ([place])   . Hyperlipidemia   . Hypertension   . PE (pulmonary embolism)   . Tobacco abuse     Patient Active Problem List   Diagnosis Date Noted  . CAD in native artery 01/08/2015  . Chest pain 01/06/2015  . Hypertension 01/06/2015  . Hyperlipidemia 01/06/2015  . PE (pulmonary embolism) 01/06/2015  . Leukocytosis 01/06/2015  . Tobacco use disorder 01/06/2015  . DVT (deep venous thrombosis) ([place]) 01/06/2015  . DVT femoral (deep venous thrombosis) with thrombophlebitis [facility])     Past Surgical History:  Procedure Laterality Date    . ABDOMINAL HYSTERECTOMY    . CARDIAC CATHETERIZATION N/A 01/08/2015   Procedure: Left Heart Cath and Coronary Angiography;  Surgeon: Jolaine Artist, MD;  Location: [place] CV LAB;  Service: Cardiovascular;  Laterality: N/A;  . TONSILLECTOMY      OB History    Gravida Para Term Preterm AB Living             2   SAB TAB Ectopic Multiple Live Births                   Home Medications    Prior to Admission medications   Medication Sig Start Date End Date Taking? Authorizing Provider  aspirin EC 81 MG tablet Take 1 tablet (81 mg total) by mouth daily. 01/08/15   Dayna N Dunn, PA-C  atorvastatin (LIPITOR) 80 MG tablet Take 1 tablet (80 mg total) by mouth every evening. 10/13/15   Jolaine Artist, MD  diazepam (VALIUM) 2 MG tablet Take 2 mg by mouth 3 (three) times daily as needed for anxiety.    Historical Provider, MD  gabapentin (NEURONTIN) 600 MG tablet Take 600 mg by mouth at bedtime.    Historical Provider, MD  lansoprazole (PREVACID) 15 MG capsule Take 15 mg by mouth daily at 12 noon.    Historical Provider, MD  lisinopril-hydrochlorothiazide (PRINZIDE,ZESTORETIC) 20-25 MG per tablet Take 1 tablet by mouth daily.      Historical Provider, MD  nitroGLYCERIN (NITROSTAT) 0.4 MG SL tablet Place 1 tablet (0.4 mg total) under the tongue every 5 (  five) minutes as needed for chest pain (up to 3 doses). 01/08/15   Dayna N Dunn, PA-C  oxyCODONE-acetaminophen (PERCOCET) 10-325 MG per tablet Take 0.5-1 tablets by mouth every 8 (eight) hours as needed for pain.     Historical Provider, MD  rivaroxaban (XARELTO) 20 MG TABS tablet Take 20 mg by mouth daily with supper.    Historical Provider, MD  Vortioxetine HBr (TRINTELLIX) 10 MG TABS Take by mouth.    Historical Provider, MD    Family History Family History  Problem Relation Age of Onset  . Heart attack Mother   . Heart attack Father   . Heart attack Brother     CABG  . Heart attack Brother     CABG  . Heart attack Brother      CABG    Social History Social History  Substance Use Topics  . Smoking status: Current Every Day Smoker    Packs/day: 1.00    Types: Cigarettes  . Smokeless tobacco: Never Used  . Alcohol use No     Allergies   Patient has no known allergies.   Review of Systems Review of Systems  Constitutional: Negative for appetite change, chills, fatigue and fever.  HENT: Negative for congestion, ear discharge and sinus pressure.   Eyes: Negative for discharge.  Respiratory: Negative for cough and shortness of breath.   Cardiovascular: Negative for chest pain and leg swelling.  Gastrointestinal: Positive for abdominal pain. Negative for diarrhea.  Genitourinary: Negative for difficulty urinating, frequency and hematuria.  Musculoskeletal: Negative for back pain.  Skin: Negative for rash.  Neurological: Negative for seizures and headaches.  Psychiatric/Behavioral: Negative for hallucinations.     Physical Exam Updated Vital Signs BP 157/72 (BP Location: Right Arm)   Pulse 68   Temp 97.3 F (36.3 C) (Temporal)   Resp 20   Ht 5\' 3"  (1.6 m)   Wt 228 lb (103.4 kg)   SpO2 100%   BMI 40.39 kg/m   Physical Exam  Constitutional: She is oriented to person, place, and time. She appears well-developed.  HENT:  Head: Normocephalic.  Eyes: Conjunctivae and EOM are normal. No scleral icterus.  Neck: Neck supple. No thyromegaly present.  Cardiovascular: Normal rate and regular rhythm.  Exam reveals no gallop and no friction rub.   No murmur heard. Pulmonary/Chest: No stridor. She has no wheezes. She has no rales. She exhibits no tenderness.  Abdominal: She exhibits no distension. There is tenderness. There is no rebound.  Moderate RLQ tenderness.  Musculoskeletal: Normal range of motion. She exhibits no edema.  Lymphadenopathy:    She has no cervical adenopathy.  Neurological: She is oriented to person, place, and time. She exhibits normal muscle tone. Coordination normal.  Skin: No  rash noted. No erythema.  Psychiatric: She has a normal mood and affect. Her behavior is normal.     ED Treatments / Results  DIAGNOSTIC STUDIES: Oxygen Saturation is 100% on RA, normal by my interpretation.    COORDINATION OF CARE: 1:12 PM Discussed treatment plan with pt at bedside, which includes labs and CT abdomen/pelvis, and pt agreed to plan.  Labs (all labs ordered are listed, but only abnormal results are displayed) Labs Reviewed - No data to display  EKG  EKG Interpretation None       Radiology No results found.  Procedures Procedures (including critical care time)  Medications Ordered in ED Medications - No data to display   Initial Impression / Assessment and Plan / ED Course  I have reviewed the triage vital signs and the nursing notes.  Pertinent labs & imaging results that were available during my care of the patient were reviewed by me and considered in my medical decision making (see chart for details).   patient with right lower quadrant pain and tenderness. Labs and CT scan unremarkable. Patient will be sent home with some pain medicine and referred to GI for follow-up    Final Clinical Impressions(s) / ED Diagnoses   Final diagnoses:  None    New Prescriptions New Prescriptions   No medications on file   The chart was scribed for me under my direct supervision.  I personally performed the history, physical, and medical decision making and all procedures in the evaluation of this patient.Milton Ferguson, MD 04/13/16 [phone]

## 2016-04-13 NOTE — Discharge Instructions (Signed)
Follow-up with Dr. Oneida Alar in one week. Return sooner if problems

## 2016-04-13 NOTE — ED Triage Notes (Signed)
Pt comes in with right groin pain starting yesterday. Pt denies any swelling, no urinary problems. Denies any n/v/d. Pt denies any injury. Pt it notably uncomfortable in triage.

## 2016-04-17 DIAGNOSIS — R1031 Right lower quadrant pain: Secondary | ICD-10-CM | POA: Diagnosis not present

## 2016-04-17 DIAGNOSIS — M545 Low back pain: Secondary | ICD-10-CM | POA: Diagnosis not present

## 2016-04-17 DIAGNOSIS — M5136 Other intervertebral disc degeneration, lumbar region: Secondary | ICD-10-CM | POA: Diagnosis not present

## 2016-04-17 DIAGNOSIS — I739 Peripheral vascular disease, unspecified: Secondary | ICD-10-CM | POA: Diagnosis not present

## 2016-04-17 DIAGNOSIS — R591 Generalized enlarged lymph nodes: Secondary | ICD-10-CM | POA: Diagnosis not present

## 2016-04-17 DIAGNOSIS — M4316 Spondylolisthesis, lumbar region: Secondary | ICD-10-CM | POA: Diagnosis not present

## 2016-04-17 DIAGNOSIS — Z86711 Personal history of pulmonary embolism: Secondary | ICD-10-CM | POA: Diagnosis not present

## 2016-04-17 DIAGNOSIS — Z86718 Personal history of other venous thrombosis and embolism: Secondary | ICD-10-CM | POA: Diagnosis not present

## 2016-04-17 DIAGNOSIS — I700 Atherosclerosis of aorta: Secondary | ICD-10-CM | POA: Diagnosis not present

## 2016-04-17 DIAGNOSIS — M79651 Pain in right thigh: Secondary | ICD-10-CM | POA: Diagnosis not present

## 2016-04-17 DIAGNOSIS — M5186 Other intervertebral disc disorders, lumbar region: Secondary | ICD-10-CM | POA: Diagnosis not present

## 2016-04-17 DIAGNOSIS — I10 Essential (primary) hypertension: Secondary | ICD-10-CM | POA: Diagnosis not present

## 2016-04-17 DIAGNOSIS — M5116 Intervertebral disc disorders with radiculopathy, lumbar region: Secondary | ICD-10-CM | POA: Diagnosis not present

## 2016-04-17 DIAGNOSIS — M5416 Radiculopathy, lumbar region: Secondary | ICD-10-CM | POA: Diagnosis not present

## 2016-04-19 DIAGNOSIS — M47816 Spondylosis without myelopathy or radiculopathy, lumbar region: Secondary | ICD-10-CM | POA: Diagnosis not present

## 2016-04-19 DIAGNOSIS — M9903 Segmental and somatic dysfunction of lumbar region: Secondary | ICD-10-CM | POA: Diagnosis not present

## 2016-04-19 DIAGNOSIS — M5136 Other intervertebral disc degeneration, lumbar region: Secondary | ICD-10-CM | POA: Diagnosis not present

## 2016-04-19 DIAGNOSIS — M5441 Lumbago with sciatica, right side: Secondary | ICD-10-CM | POA: Diagnosis not present

## 2016-04-20 DIAGNOSIS — M5136 Other intervertebral disc degeneration, lumbar region: Secondary | ICD-10-CM | POA: Diagnosis not present

## 2016-04-20 DIAGNOSIS — M5441 Lumbago with sciatica, right side: Secondary | ICD-10-CM | POA: Diagnosis not present

## 2016-04-20 DIAGNOSIS — M47816 Spondylosis without myelopathy or radiculopathy, lumbar region: Secondary | ICD-10-CM | POA: Diagnosis not present

## 2016-04-20 DIAGNOSIS — M9903 Segmental and somatic dysfunction of lumbar region: Secondary | ICD-10-CM | POA: Diagnosis not present

## 2016-04-21 DIAGNOSIS — M47816 Spondylosis without myelopathy or radiculopathy, lumbar region: Secondary | ICD-10-CM | POA: Diagnosis not present

## 2016-04-21 DIAGNOSIS — M5136 Other intervertebral disc degeneration, lumbar region: Secondary | ICD-10-CM | POA: Diagnosis not present

## 2016-04-21 DIAGNOSIS — M5441 Lumbago with sciatica, right side: Secondary | ICD-10-CM | POA: Diagnosis not present

## 2016-04-21 DIAGNOSIS — M9903 Segmental and somatic dysfunction of lumbar region: Secondary | ICD-10-CM | POA: Diagnosis not present

## 2016-04-26 DIAGNOSIS — M9903 Segmental and somatic dysfunction of lumbar region: Secondary | ICD-10-CM | POA: Diagnosis not present

## 2016-04-26 DIAGNOSIS — M5441 Lumbago with sciatica, right side: Secondary | ICD-10-CM | POA: Diagnosis not present

## 2016-04-26 DIAGNOSIS — M47816 Spondylosis without myelopathy or radiculopathy, lumbar region: Secondary | ICD-10-CM | POA: Diagnosis not present

## 2016-04-26 DIAGNOSIS — M5136 Other intervertebral disc degeneration, lumbar region: Secondary | ICD-10-CM | POA: Diagnosis not present

## 2016-04-27 DIAGNOSIS — M5136 Other intervertebral disc degeneration, lumbar region: Secondary | ICD-10-CM | POA: Diagnosis not present

## 2016-04-27 DIAGNOSIS — I959 Hypotension, unspecified: Secondary | ICD-10-CM | POA: Diagnosis not present

## 2016-04-27 DIAGNOSIS — B370 Candidal stomatitis: Secondary | ICD-10-CM | POA: Diagnosis not present

## 2016-04-27 DIAGNOSIS — M47816 Spondylosis without myelopathy or radiculopathy, lumbar region: Secondary | ICD-10-CM | POA: Diagnosis not present

## 2016-04-27 DIAGNOSIS — M9903 Segmental and somatic dysfunction of lumbar region: Secondary | ICD-10-CM | POA: Diagnosis not present

## 2016-04-27 DIAGNOSIS — Z1389 Encounter for screening for other disorder: Secondary | ICD-10-CM | POA: Diagnosis not present

## 2016-04-27 DIAGNOSIS — M5441 Lumbago with sciatica, right side: Secondary | ICD-10-CM | POA: Diagnosis not present

## 2016-05-01 DIAGNOSIS — M5136 Other intervertebral disc degeneration, lumbar region: Secondary | ICD-10-CM | POA: Diagnosis not present

## 2016-05-01 DIAGNOSIS — M9903 Segmental and somatic dysfunction of lumbar region: Secondary | ICD-10-CM | POA: Diagnosis not present

## 2016-05-01 DIAGNOSIS — M47816 Spondylosis without myelopathy or radiculopathy, lumbar region: Secondary | ICD-10-CM | POA: Diagnosis not present

## 2016-05-01 DIAGNOSIS — M5441 Lumbago with sciatica, right side: Secondary | ICD-10-CM | POA: Diagnosis not present

## 2016-05-03 DIAGNOSIS — M5441 Lumbago with sciatica, right side: Secondary | ICD-10-CM | POA: Diagnosis not present

## 2016-05-03 DIAGNOSIS — M5136 Other intervertebral disc degeneration, lumbar region: Secondary | ICD-10-CM | POA: Diagnosis not present

## 2016-05-03 DIAGNOSIS — M9903 Segmental and somatic dysfunction of lumbar region: Secondary | ICD-10-CM | POA: Diagnosis not present

## 2016-05-03 DIAGNOSIS — M47816 Spondylosis without myelopathy or radiculopathy, lumbar region: Secondary | ICD-10-CM | POA: Diagnosis not present

## 2016-05-08 DIAGNOSIS — M5136 Other intervertebral disc degeneration, lumbar region: Secondary | ICD-10-CM | POA: Diagnosis not present

## 2016-05-08 DIAGNOSIS — M9903 Segmental and somatic dysfunction of lumbar region: Secondary | ICD-10-CM | POA: Diagnosis not present

## 2016-05-08 DIAGNOSIS — M5441 Lumbago with sciatica, right side: Secondary | ICD-10-CM | POA: Diagnosis not present

## 2016-05-08 DIAGNOSIS — M47816 Spondylosis without myelopathy or radiculopathy, lumbar region: Secondary | ICD-10-CM | POA: Diagnosis not present

## 2016-05-11 DIAGNOSIS — M5136 Other intervertebral disc degeneration, lumbar region: Secondary | ICD-10-CM | POA: Diagnosis not present

## 2016-05-11 DIAGNOSIS — M47816 Spondylosis without myelopathy or radiculopathy, lumbar region: Secondary | ICD-10-CM | POA: Diagnosis not present

## 2016-05-11 DIAGNOSIS — M5441 Lumbago with sciatica, right side: Secondary | ICD-10-CM | POA: Diagnosis not present

## 2016-05-11 DIAGNOSIS — M9903 Segmental and somatic dysfunction of lumbar region: Secondary | ICD-10-CM | POA: Diagnosis not present

## 2016-05-15 DIAGNOSIS — M5441 Lumbago with sciatica, right side: Secondary | ICD-10-CM | POA: Diagnosis not present

## 2016-05-15 DIAGNOSIS — M47816 Spondylosis without myelopathy or radiculopathy, lumbar region: Secondary | ICD-10-CM | POA: Diagnosis not present

## 2016-05-15 DIAGNOSIS — M5136 Other intervertebral disc degeneration, lumbar region: Secondary | ICD-10-CM | POA: Diagnosis not present

## 2016-05-15 DIAGNOSIS — M9903 Segmental and somatic dysfunction of lumbar region: Secondary | ICD-10-CM | POA: Diagnosis not present

## 2016-05-18 DIAGNOSIS — M5136 Other intervertebral disc degeneration, lumbar region: Secondary | ICD-10-CM | POA: Diagnosis not present

## 2016-05-18 DIAGNOSIS — M47816 Spondylosis without myelopathy or radiculopathy, lumbar region: Secondary | ICD-10-CM | POA: Diagnosis not present

## 2016-05-18 DIAGNOSIS — M9903 Segmental and somatic dysfunction of lumbar region: Secondary | ICD-10-CM | POA: Diagnosis not present

## 2016-05-18 DIAGNOSIS — M5441 Lumbago with sciatica, right side: Secondary | ICD-10-CM | POA: Diagnosis not present

## 2016-06-01 DIAGNOSIS — R42 Dizziness and giddiness: Secondary | ICD-10-CM | POA: Diagnosis not present

## 2016-06-01 DIAGNOSIS — M545 Low back pain: Secondary | ICD-10-CM | POA: Diagnosis not present

## 2016-06-02 ENCOUNTER — Other Ambulatory Visit (HOSPITAL_COMMUNITY): Payer: Self-pay | Admitting: Family Medicine

## 2016-06-02 ENCOUNTER — Other Ambulatory Visit (HOSPITAL_COMMUNITY): Payer: Self-pay | Admitting: *Deleted

## 2016-06-02 ENCOUNTER — Ambulatory Visit (HOSPITAL_COMMUNITY)
Admission: RE | Admit: 2016-06-02 | Discharge: 2016-06-02 | Disposition: A | Discharge status: Home / Self Care | Payer: Medicare Other | Source: Ambulatory Visit | Attending: Internal Medicine | Admitting: Internal Medicine

## 2016-06-02 DIAGNOSIS — G319 Degenerative disease of nervous system, unspecified: Secondary | ICD-10-CM | POA: Diagnosis not present

## 2016-06-02 DIAGNOSIS — G459 Transient cerebral ischemic attack, unspecified: Secondary | ICD-10-CM | POA: Diagnosis present

## 2016-06-02 DIAGNOSIS — R42 Dizziness and giddiness: Secondary | ICD-10-CM

## 2016-06-02 DIAGNOSIS — I672 Cerebral atherosclerosis: Secondary | ICD-10-CM | POA: Diagnosis not present

## 2016-06-20 ENCOUNTER — Encounter: Payer: Self-pay | Admitting: Neurology

## 2016-06-20 ENCOUNTER — Ambulatory Visit (INDEPENDENT_AMBULATORY_CARE_PROVIDER_SITE_OTHER): Payer: Medicare Other | Admitting: Neurology

## 2016-06-20 VITALS — BP 145/80 | HR 68 | Ht 63.0 in | Wt 219.0 lb

## 2016-06-20 DIAGNOSIS — R200 Anesthesia of skin: Secondary | ICD-10-CM | POA: Insufficient documentation

## 2016-06-20 DIAGNOSIS — I2699 Other pulmonary embolism without acute cor pulmonale: Secondary | ICD-10-CM

## 2016-06-20 DIAGNOSIS — R42 Dizziness and giddiness: Secondary | ICD-10-CM | POA: Insufficient documentation

## 2016-06-20 DIAGNOSIS — I824Z2 Acute embolism and thrombosis of unspecified deep veins of left distal lower extremity: Secondary | ICD-10-CM

## 2016-06-20 MED ORDER — CLOPIDOGREL BISULFATE 75 MG PO TABS: 75.0000 mg | ORAL_TABLET | Freq: Every day | ORAL | 11 refills | Status: DC

## 2016-06-20 NOTE — Progress Notes (Addendum)
PATIENT: Jennifer [name] DOB: Aug 20, 1950  Chief Complaint  Patient presents with  . Dizziness    Reports intermittent dizzy spells made worse with positional changes.  Says she passed out with a loss of consciousness on 05/23/16.  She had gotten up from a seated position to answer the phone when this event occurred.  She is unsure how long she was in the floor but felt confused when she regained consciousness.  She has had recent abnormal CT head scan and abnormal carotid ultrasound.  She has a pending appt with Dr. Oletta Lamas who is a vascular surgeon at [facility].  . Orthostatic Vitals    Lying: 145/80, 68, Sitting: 142/82, 79, Standing: 140/72, 81, Standing x 3 minutes: 139/78, 81.   . Memory Loss    MMSE 30/30 - 16 animals.  Marland Kitchen PCP    Redmond School, MD     HISTORICAL  Jennifer [name] is a 66 years old right-handed female, seen in refer by her primary care physician Dr.  Redmond School for evaluation of dizziness, initial evaluation was on June 20 2016.  I reviewed and summarized the referring note, she had a history of hypertension, hyperlipidemia, depression, on chronic Cymbalta 30 mg every day, diazepam 2 mg 1 tablet as needed, she has history of DVT, PE in 2008, now on chronic xelrato treatment, CAD by cardiac catheter denies history of heart attack, she is taking 81 mg daily , she smoked pack a day for 50 years, she had a history of chronic low back pain, mild gait abnormality when her low back pain flares up, require intermittent epidural injection.   She reported dizziness since January 2018, she had few episodes of set onset vertigo, spinning sensation, blurry vision, difficulty walking, to the point of throwing up, each episode last few minutes, resolved by resting for a while, it only happened intermittent initially, but gradually has increased frequency now she is having spells up to 4 times each week,  She reported one episode on May 23 2016, after sitting on the couch for about  40 minutes, she got up to pick up the phone, then she felt lightheadedness dizziness, passed out on the floor, was transient loss of consciousness,  There was also one episode she was driving her car, had set onset dizziness, she has to pull over her car, throwing up, sitting still for few minutes for the episode to pass, she denies significant headache, no visual change, no slurred speech, she also had few episode of intermittent left arm weakness since February 2018,  She also complains of memory loss, difficulty focusing, misplace things,  I reviewed the laboratory evaluation in February 2018:normal CBC hemoglobin of 14 point 2, CMP, glucose 102, creatinine of 0.98, normal UA,  REVIEW OF SYSTEMS: Full 14 system review of systems performed and notable only for weight loss, fatigue, murmur, spinning sensation, bruising, easy bruising, easy bleeding, memory loss, weakness, dizziness, passing out, tremor, restless leg, depression, anxiety, too much sleep, decreased energy, change in appetite, disinterested in activities  ALLERGIES: Allergies  Allergen Reactions  . Bee Venom Anaphylaxis    HOME MEDICATIONS: Current Outpatient Prescriptions  Medication Sig Dispense Refill  . amLODipine (NORVASC) 5 MG tablet Take 5 mg by mouth daily.    Marland Kitchen aspirin EC 81 MG tablet Take 1 tablet (81 mg total) by mouth daily. 30 tablet 11  . diazepam (VALIUM) 2 MG tablet Take 2 mg by mouth daily as needed for anxiety.     Marland Kitchen  DULoxetine (CYMBALTA) 30 MG capsule Take 30 mg by mouth daily.    Marland Kitchen gabapentin (NEURONTIN) 600 MG tablet Take 300-600 mg by mouth See admin instructions. Take 300 mg twice daily and 600 mg at bedtime.    Marland Kitchen lisinopril-hydrochlorothiazide (PRINZIDE,ZESTORETIC) 20-25 MG per tablet Take 1 tablet by mouth daily.      . Oxycodone HCl 10 MG TABS Take 10 mg by mouth 3 (three) times daily as needed (pain).    Marland Kitchen PROAIR HFA 108 (90 Base) MCG/ACT inhaler Inhale 1 puff into the lungs daily as needed for  shortness of breath.    . rivaroxaban (XARELTO) 20 MG TABS tablet Take 20 mg by mouth daily with supper.     No current facility-administered medications for this visit.     PAST MEDICAL HISTORY: Past Medical History:  Diagnosis Date  . CAD (coronary artery disease)    a. Cath 12/2014 - mild-moderate non-obstructive CAD with 60% mid RCA, 50% mid Cx-distal Cx, 30% distal LAD, 25% D2, EF 55-65%.  . Depression   . Dizziness   . DVT (deep venous thrombosis) ([place])   . Hyperlipidemia   . Hypertension   . PE (pulmonary embolism)   . Tobacco abuse     PAST SURGICAL HISTORY: Past Surgical History:  Procedure Laterality Date  . ABDOMINAL HYSTERECTOMY    . CARDIAC CATHETERIZATION N/A 01/08/2015   Procedure: Left Heart Cath and Coronary Angiography;  Surgeon: Jolaine Artist, MD;  Location: [facility];  Service: Cardiovascular;  Laterality: N/A;  . TONSILLECTOMY      FAMILY HISTORY: Family History  Problem Relation Age of Onset  . Heart attack Mother   . Heart attack Father   . Heart attack Brother     CABG  . Heart attack Brother     CABG  . Heart attack Brother     CABG  . Ovarian cancer Maternal Grandmother   . Heart disease Maternal Grandfather   . Dementia Paternal Grandmother     SOCIAL HISTORY:  Social History   Social History  . Marital status: Divorced    Spouse name: N/A  . Number of children: 2  . Years of education: 12   Occupational History  . Retired    Social History Main Topics  . Smoking status: Current Every Day Smoker    Packs/day: 1.00    Types: Cigarettes  . Smokeless tobacco: Never Used  . Alcohol use No  . Drug use: No  . Sexual activity: Not on file   Other Topics Concern  . Not on file   Social History Narrative   Lives at home alone.   Right-handed.   2 cups caffeine per day.     PHYSICAL EXAM   Vitals:   06/20/16 1339  BP: (!) 145/80  Pulse: 68  Weight: 219 lb (99.3 kg)  Height: 5\' 3"  (1.6 m)    Not  recorded      Body mass index is 38.79 kg/m.  PHYSICAL EXAMNIATION:  Gen: NAD, conversant, well nourised, obese, well groomed                     Cardiovascular: Regular rate rhythm, no peripheral edema, warm, nontender. Eyes: Conjunctivae clear without exudates or hemorrhage Neck: Supple, no carotid bruits. Pulmonary: Clear to auscultation bilaterally   NEUROLOGICAL EXAM:  Animal naming 16  MMSE - Mini Mental State Exam 06/20/2016  Orientation to time 5  Orientation to Place 5  Registration 3  Attention/ Calculation 5  Recall 3  Language- name 2 objects 2  Language- repeat 1  Language- follow 3 step command 3  Language- read & follow direction 1  Write a sentence 1  Copy design 1  Total score 30   Mini-Mental Status  CRANIAL NERVES: CN II: Visual fields are full to confrontation. Fundoscopic exam is normal with sharp discs and no vascular changes. Pupils are round equal and briskly reactive to light. CN III, IV, VI: extraocular movement are normal. No ptosis. CN V: Facial sensation is intact to pinprick in all 3 divisions bilaterally. Corneal responses are intact.  CN VII: Face is symmetric with normal eye closure and smile. CN VIII: Hearing is normal to rubbing fingers CN IX, X: Palate elevates symmetrically. Phonation is normal. CN XI: Head turning and shoulder shrug are intact CN XII: Tongue is midline with normal movements and no atrophy.  MOTOR: There is no pronator drift of out-stretched arms. Muscle bulk and tone are normal. Muscle strength is normal.  REFLEXES: Reflexes are 2+ and symmetric at the biceps, triceps, knees, and ankles. Plantar responses are flexor.  SENSORY: Intact to light touch, pinprick, positional sensation and vibratory sensation are intact in fingers and toes.  COORDINATION: Rapid alternating movements and fine finger movements are intact. There is no dysmetria on finger-to-nose and heel-knee-shin.    GAIT/STANCE: Posture is normal.  Gait is steady with normal steps, base, arm swing, and turning. Heel and toe walking are normal. Tandem gait is normal.  Romberg is absent.   DIAGNOSTIC DATA (LABS, IMAGING, TESTING) - I reviewed patient records, labs, notes, testing and imaging myself where available.   ASSESSMENT AND PLAN  Jennifer [name] is a 66 y.o. female   Recurrent episode of sudden onset vertigo  She has multiple vascular risk factors, include longtime smoker, previous coronary artery disease, hypertension, hyperlipidemia,  Differentiation diagnosis including posterior circulation TIA  Proceed with MRI of the brain, MRA of the brain and neck  Laboratory evaluations  Change from aspirin to Plavix daily  Mild cognitive impairment  Mini-Mental Status Examination 30 out of 30, animal naming 16 Marcial Pacas, M.D. Ph.D.  [facility] [address], [place], [place] [address] Ph: [phone] Fax: [phone]  CC: Redmond School, MD

## 2016-06-21 ENCOUNTER — Other Ambulatory Visit: Payer: Self-pay | Admitting: *Deleted

## 2016-06-21 ENCOUNTER — Other Ambulatory Visit: Payer: Self-pay | Admitting: Internal Medicine

## 2016-06-21 ENCOUNTER — Telehealth: Payer: Self-pay | Admitting: Neurology

## 2016-06-21 LAB — VITAMIN D 25 HYDROXY (VIT D DEFICIENCY, FRACTURES): Vit D, 25-Hydroxy: 7.7 ng/mL — ABNORMAL LOW (ref 30.0–100.0)

## 2016-06-21 LAB — RPR: RPR Ser Ql: NONREACTIVE

## 2016-06-21 LAB — FOLATE: Folate: 13.7 ng/mL (ref >3.0)

## 2016-06-21 LAB — VITAMIN B12: Vitamin B-12: 260 pg/mL (ref 232–1245)

## 2016-06-21 LAB — HGB A1C W/O EAG: Hgb A1c MFr Bld: 5.5 % (ref 4.8–5.6)

## 2016-06-21 LAB — C-REACTIVE PROTEIN: CRP: 0.7 mg/L (ref 0.0–4.9)

## 2016-06-21 LAB — ANA W/REFLEX IF POSITIVE: Anti Nuclear Antibody(ANA): NEGATIVE

## 2016-06-21 LAB — CK: Total CK: 101 U/L (ref 24–173)

## 2016-06-21 LAB — TSH: TSH: 1.48 u[IU]/mL (ref 0.450–4.500)

## 2016-06-21 LAB — SEDIMENTATION RATE: Sed Rate: 6 mm/hr (ref 0–40)

## 2016-06-21 MED ORDER — VITAMIN D (ERGOCALCIFEROL) 1.25 MG (50000 UNIT) PO CAPS: 50000.0000 [IU] | ORAL_CAPSULE | ORAL | 0 refills | Status: DC

## 2016-06-21 MED ORDER — VITAMIN D (ERGOCALCIFEROL) 1.25 MG (50000 UNIT) PO CAPS: ORAL_CAPSULE | ORAL | 0 refills | Status: DC

## 2016-06-21 NOTE — Telephone Encounter (Signed)
Please call patient, significantly decreased vitamin D level 7, with normal being 30 and above,  I have called in vitamin D supplement 50,000 units 1 capsule every week, we will repeat level in 4-6 months.  Rest of the laboratory evaluation showed no significant abnormality

## 2016-06-21 NOTE — Telephone Encounter (Signed)
Spoke to patient - she is aware of labs and agreeable to the recommended treatment.

## 2016-06-22 ENCOUNTER — Telehealth: Payer: Self-pay | Admitting: Neurology

## 2016-06-22 DIAGNOSIS — G459 Transient cerebral ischemic attack, unspecified: Secondary | ICD-10-CM

## 2016-06-22 NOTE — Telephone Encounter (Signed)
Seen by Jory Sims, NP

## 2016-06-22 NOTE — Telephone Encounter (Signed)
Jennifer [name] called stating the order that Dr Krista Blue sent needs to be resent for the MRI of the neck, it needs to be with and without contrast

## 2016-06-22 NOTE — Telephone Encounter (Signed)
Dr. Krista Blue can you switch the order to a MRA neck w/wo contrast?

## 2016-06-23 ENCOUNTER — Telehealth: Payer: Self-pay | Admitting: Neurology

## 2016-06-23 NOTE — Telephone Encounter (Signed)
[facility] called me and asked if Dr. Krista Blue could change the MRA neck order to MRA w/wo contrast instead of just with.Jennifer [name]

## 2016-06-23 NOTE — Addendum Note (Signed)
Addended by: Marcial Pacas on: 06/23/2016 11:45 AM   Modules accepted: Orders

## 2016-06-23 NOTE — Addendum Note (Signed)
Addended by: Marcial Pacas on: 06/23/2016 11:48 AM   Modules accepted: Orders

## 2016-06-28 DIAGNOSIS — Z7901 Long term (current) use of anticoagulants: Secondary | ICD-10-CM | POA: Diagnosis not present

## 2016-06-28 DIAGNOSIS — I6523 Occlusion and stenosis of bilateral carotid arteries: Secondary | ICD-10-CM | POA: Diagnosis not present

## 2016-06-28 DIAGNOSIS — Z7902 Long term (current) use of antithrombotics/antiplatelets: Secondary | ICD-10-CM | POA: Diagnosis not present

## 2016-07-04 ENCOUNTER — Ambulatory Visit
Admission: RE | Admit: 2016-07-04 | Discharge: 2016-07-04 | Disposition: A | Discharge status: Home / Self Care | Payer: Medicare Other | Source: Ambulatory Visit | Attending: Neurology | Admitting: Neurology

## 2016-07-04 DIAGNOSIS — G459 Transient cerebral ischemic attack, unspecified: Secondary | ICD-10-CM | POA: Diagnosis not present

## 2016-07-04 DIAGNOSIS — I6523 Occlusion and stenosis of bilateral carotid arteries: Secondary | ICD-10-CM | POA: Diagnosis not present

## 2016-07-04 MED ORDER — GADOBENATE DIMEGLUMINE 529 MG/ML IV SOLN
19.0000 mL | Freq: Once | INTRAVENOUS | Status: AC | PRN
  Administered 2016-07-04: 19 mL via INTRAVENOUS

## 2016-07-07 ENCOUNTER — Telehealth: Payer: Self-pay | Admitting: Neurology

## 2016-07-07 NOTE — Telephone Encounter (Signed)
Please call patient: MRI of the neck showed significant 80% stenosis of right internal carotid artery, left internal carotid artery had 50% stenosis, bilateral vertebral arteries are poorly visualized,  Please make sure she keeps her MRI appointment on July 10 2016   Keep current treatment  IMPRESSION:  Abnormal MRA neck (with and without) demonstrating: 1. Right internal carotid artery has ~80% stenosis at the carotid bifurcation. 2. Left internal carotid artery has ~50% stenosis at the carotid bifurcation. 3. Bilateral vertebral artery origins are poorly visualized may be due to technical artifact versus atherosclerosis. 4. 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

## 2016-07-07 NOTE — Telephone Encounter (Signed)
Pt returned the call to Faith, please call back

## 2016-07-07 NOTE — Telephone Encounter (Signed)
[facility] mobile #.  Home # not in service/fim

## 2016-07-10 ENCOUNTER — Ambulatory Visit
Admission: RE | Admit: 2016-07-10 | Discharge: 2016-07-10 | Disposition: A | Discharge status: Home / Self Care | Payer: Medicare Other | Source: Ambulatory Visit | Attending: Neurology | Admitting: Neurology

## 2016-07-10 DIAGNOSIS — R42 Dizziness and giddiness: Secondary | ICD-10-CM

## 2016-07-10 DIAGNOSIS — R200 Anesthesia of skin: Secondary | ICD-10-CM

## 2016-07-10 DIAGNOSIS — I824Z2 Acute embolism and thrombosis of unspecified deep veins of left distal lower extremity: Secondary | ICD-10-CM

## 2016-07-10 DIAGNOSIS — I639 Cerebral infarction, unspecified: Secondary | ICD-10-CM | POA: Diagnosis not present

## 2016-07-10 DIAGNOSIS — I2699 Other pulmonary embolism without acute cor pulmonale: Secondary | ICD-10-CM

## 2016-07-10 NOTE — Telephone Encounter (Signed)
I have spoken with Jennifer [name] this morning and per YY, reviewed results of MRI neck as below, and advised pt. keep MRI appt. today; continue current tx. plan.  She verbalzied understanding of same/fim

## 2016-07-28 ENCOUNTER — Ambulatory Visit (INDEPENDENT_AMBULATORY_CARE_PROVIDER_SITE_OTHER): Payer: Medicare Other | Admitting: Neurology

## 2016-07-28 DIAGNOSIS — M545 Low back pain, unspecified: Secondary | ICD-10-CM | POA: Insufficient documentation

## 2016-07-28 DIAGNOSIS — R269 Unspecified abnormalities of gait and mobility: Secondary | ICD-10-CM

## 2016-07-28 DIAGNOSIS — R202 Paresthesia of skin: Secondary | ICD-10-CM | POA: Diagnosis not present

## 2016-07-28 DIAGNOSIS — I6523 Occlusion and stenosis of bilateral carotid arteries: Secondary | ICD-10-CM

## 2016-07-28 DIAGNOSIS — I2699 Other pulmonary embolism without acute cor pulmonale: Secondary | ICD-10-CM

## 2016-07-28 DIAGNOSIS — I824Z2 Acute embolism and thrombosis of unspecified deep veins of left distal lower extremity: Secondary | ICD-10-CM

## 2016-07-28 DIAGNOSIS — G8929 Other chronic pain: Secondary | ICD-10-CM

## 2016-07-28 DIAGNOSIS — R200 Anesthesia of skin: Secondary | ICD-10-CM

## 2016-07-28 DIAGNOSIS — I6529 Occlusion and stenosis of unspecified carotid artery: Secondary | ICD-10-CM | POA: Insufficient documentation

## 2016-07-28 DIAGNOSIS — R42 Dizziness and giddiness: Secondary | ICD-10-CM

## 2016-07-28 NOTE — Procedures (Signed)
Full Name: Jennifer [name] Gender: Female MRN #: [id number] Date of Birth: [birth year]    Visit Date: 07/28/16 12:02 Age: 66 Years 23 Months Old Examining Physician: Marcial Pacas, MD  Referring Physician: Krista Blue, MD History: 66 year old female, presenting with frequent recurrent episode of vertigo. She complains of bilateral feet paresthesia dizziness lightheadedness with sudden positional change   Summary of the test:   Nerve conduction study:  Bilateral sural sensory responses were normal.   Bilateral peroneal to EDB motor responses were normal. Bilateral tibial motor responses showed mildly decreased C map amplitude, with normal distal latency, conduction velocity. Bilateral tibial H reflexes and F waves were absent.   Electromyography:  Selective needle examinations of bilateral lower extremity muscles and bilateral lumbosacral paraspinals were performed. There is evidence of slight chronic neuropathic changes involving bilateral tibialis anterior, tibialis posterior    Conclusion: This is a mild abnormal study. There is electrodiagnostic evidence of mild chronic neuropathic changes involving bilateral L4-5 myotomes, indicating chronic bilateral lumbar sacral radiculopathy. There is no evidence of large fiber peripheral neuropathy.    ------------------------------- Physician Name, M.D.  [facility] [place], [place] [address] Tel: [phone] Fax: [phone]        [facility]    Nerve / Sites Muscle Latency Ref. Amplitude Ref. Rel Amp Segments Distance Velocity Ref. Area    ms ms mV mV %  cm m/s m/s mVms  L Peroneal - EDB     Ankle EDB 4.6 ?6.5 2.2 ?2.0 100 Ankle - EDB 9   7.1     Fib head EDB 11.3  2.3  105 Fib head - Ankle 30 45 ?44 8.3     Pop fossa EDB 14.0  2.2  97 Pop fossa - Fib head 12 44 ?44 8.1         Pop fossa - Ankle      R Peroneal - EDB     Ankle EDB 5.2 ?6.5 2.1 ?2.0 100 Ankle - EDB 9   6.5     Fib head EDB 13.1  1.7  81.4  Fib head - Ankle 36 45 ?44 5.5     Pop fossa EDB 15.2  1.7  103 Pop fossa - Fib head 10 47 ?44 6.3         Pop fossa - Ankle      L Tibial - AH     Ankle AH 5.7 ?5.8 1.0 ?4.0 100 Ankle - AH 9   1.8     Pop fossa AH 15.8  0.6  65.4 Pop fossa - Ankle 40 40 ?41 0.9  R Tibial - AH     Ankle AH 5.8 ?5.8 1.9 ?4.0 100 Ankle - AH 9   3.3     Pop fossa AH 15.4  1.3  68.8 Pop fossa - Ankle 40 42 ?41 3.5             SNC    Nerve / Sites Rec. Site Peak Lat Ref.  Amp Ref. Segments Distance    ms ms V V  cm  L Sural - Ankle (Calf)     Calf Ankle 3.1 ?4.4 5 ?6 Calf - Ankle 14  R Sural - Ankle (Calf)     Calf Ankle 2.9 ?4.4 8 ?6 Calf - Ankle 14         F  Wave    Nerve F Lat Ref.   ms ms  L Tibial - AH NR ?  56.0  R Tibial - AH NR ?56.0         H Reflex    Nerve H Lat Lat Hmax   ms ms   Left Right Ref. Left Right Ref.  Tibial - Soleus NR NR ?35.0 NR NR ?35.0         EMG full       EMG Summary Table    Spontaneous MUAP Recruitment  Muscle IA Fib PSW Fasc Other Amp Dur. Poly Pattern  R. Tibialis anterior Normal None None None _______ Normal Normal Normal Reduced  R. Tibialis posterior Normal None None None _______ Normal Normal Normal Reduced  R. Peroneus longus Normal None None None _______ Normal Normal Normal Normal  R. Vastus lateralis Normal None None None _______ Normal Normal Normal Normal  L. Tibialis anterior Normal None None None _______ Normal Normal Normal Reduced  L. Tibialis posterior Normal None None None _______ Normal Normal Normal Reduced  L. Gastrocnemius (Medial head) Normal None None None _______ Normal Normal Normal Reduced  L. Vastus lateralis Normal None None None _______ Normal Normal Normal Normal  L. Gluteus medius Normal None None None _______ Normal Normal Normal Reduced  L. Biceps femoris (long head) Normal None None None _______ Normal Normal Normal Normal  L. Lumbar paraspinals (low) Normal None None None _______ Normal Normal Normal Normal  L. Lumbar  paraspinals (mid) Normal None None None _______ Normal Normal Normal Normal  R. Lumbar paraspinals (low) Normal None None None _______ Normal Normal Normal Normal  R. Lumbar paraspinals (mid) Normal None None None _______ Normal Normal Normal Normal

## 2016-07-28 NOTE — Progress Notes (Addendum)
PATIENT: Jennifer [name] DOB: Nov 19, 1950  No chief complaint on file.    HISTORICAL  Jennifer [name] is a 66 years old right-handed female, seen in refer by her primary care physician Dr.  Redmond School for evaluation of dizziness, initial evaluation was on June 20 2016.  I reviewed and summarized the referring note, she had a history of hypertension, hyperlipidemia, depression, on chronic Cymbalta 30 mg every day, diazepam 2 mg 1 tablet as needed, she has history of DVT, PE in 2008, now on chronic xelrato treatment, CAD by cardiac catheter denies history of heart attack, she is taking 81 mg daily , she smoked pack a day for 50 years, she had a history of chronic low back pain, mild gait abnormality when her low back pain flares up, require intermittent epidural injection.   She reported dizziness since January 2018, she had few episodes of set onset vertigo, spinning sensation, blurry vision, difficulty walking, to the point of throwing up, each episode last few minutes, resolved by resting for a while, it only happened intermittent initially, but gradually has increased frequency now she is having spells up to 4 times each week,  She reported one episode on May 23 2016, after sitting on the couch for about 40 minutes, she got up to pick up the phone, then she felt lightheadedness dizziness, passed out on the floor, was transient loss of consciousness,  There was also one episode she was driving her car, had set onset dizziness, she has to pull over her car, throwing up, sitting still for few minutes for the episode to pass, she denies significant headache, no visual change, no slurred speech, she also had few episode of intermittent left arm weakness since February 2018,  She also complains of memory loss, difficulty focusing, misplace things,  I reviewed the laboratory evaluation in February 2018:normal CBC hemoglobin of 14 point 2, CMP, glucose 102, creatinine of 0.98, normal  UA  UPDATE Jul 28 2016: Her dizziness has improved, she is now taking Plavix 75 mg daily instead of aspirin daily,  We have personally reviewed MRI of the brain on Jul 11 2016, mild generalized atrophy, small amount of supratentorium small vessel disease. MRA of the brain showed no significant large vessel disease. MRA of neck showed about 80% stenosis at right internal carotid artery, left-sided was 50%, bilateral vertebral artery was poorly visualized may be due to technical artifact versus atherosclerosis. 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 REVIEW OF SYSTEMS: Full 14 system review of systems performed and notable only for  as above  ALLERGIES: Allergies  Allergen Reactions  . Bee Venom Anaphylaxis    HOME MEDICATIONS: Current Outpatient Prescriptions  Medication Sig Dispense Refill  . amLODipine (NORVASC) 5 MG tablet Take 5 mg by mouth daily.    . clopidogrel (PLAVIX) 75 MG tablet Take 1 tablet (75 mg total) by mouth daily. 30 tablet 11  . diazepam (VALIUM) 2 MG tablet Take 2 mg by mouth daily as needed for anxiety.     . DULoxetine (CYMBALTA) 30 MG capsule Take 30 mg by mouth daily.    Marland Kitchen  gabapentin (NEURONTIN) 600 MG tablet Take 300-600 mg by mouth See admin instructions. Take 300 mg twice daily and 600 mg at bedtime.    Marland Kitchen lisinopril-hydrochlorothiazide (PRINZIDE,ZESTORETIC) 20-25 MG per tablet Take 1 tablet by mouth daily.      . Oxycodone HCl 10 MG TABS Take 10 mg by mouth 3 (three) times daily as needed (pain).    Marland Kitchen PROAIR HFA 108 (90 Base) MCG/ACT  inhaler Inhale 1 puff into the lungs daily as needed for shortness of breath.    . rivaroxaban (XARELTO) 20 MG TABS tablet Take 20 mg by mouth daily with supper.    . Vitamin D, Ergocalciferol, (DRISDOL) 50000 units CAPS capsule Take one capsule weekly x 8 weeks then convert to OTC Vitamin D 2000 units daily thereafter. 8 capsule 0   No current facility-administered medications for this visit.     PAST MEDICAL HISTORY: Past Medical History:  Diagnosis Date  . CAD (coronary artery disease)    a. Cath 12/2014 - mild-moderate non-obstructive CAD with 60% mid RCA, 50% mid Cx-distal Cx, 30% distal LAD, 25% D2, EF 55-65%.  . Depression   . Dizziness   . DVT (deep venous thrombosis) ([place])   . Hyperlipidemia   . Hypertension   . PE (pulmonary embolism)   . Tobacco abuse     PAST SURGICAL HISTORY: Past Surgical History:  Procedure Laterality Date  . ABDOMINAL HYSTERECTOMY    . CARDIAC CATHETERIZATION N/A 01/08/2015   Procedure: Left Heart Cath and Coronary Angiography;  Surgeon: Jolaine Artist, MD;  Location: [facility];  Service: Cardiovascular;  Laterality: N/A;  . TONSILLECTOMY      FAMILY HISTORY: Family History  Problem Relation Age of Onset  . Heart attack Mother   . Heart attack Father   . Heart attack Brother        CABG  . Heart attack Brother        CABG  . Heart attack Brother        CABG  . Ovarian cancer Maternal Grandmother   . Heart disease Maternal Grandfather   . Dementia Paternal Grandmother     SOCIAL HISTORY:  Social History   Social History  . Marital status: Divorced    Spouse name: N/A  . Number of children: 2  . Years of education: 12   Occupational History  . Retired    Social History Main Topics  . Smoking status: Current Every Day Smoker    Packs/day: 1.00    Types: Cigarettes  . Smokeless tobacco: Never Used  . Alcohol use No  . Drug use: No  . Sexual activity: Not on file   Other Topics Concern  . Not on file    Social History Narrative   Lives at home alone.   Right-handed.   2 cups caffeine per day.     PHYSICAL EXAM   There were no vitals filed for this visit.  Not recorded      There is no height or weight on file to calculate BMI.  PHYSICAL EXAMNIATION:  Gen: NAD, conversant, well nourised, obese, well groomed                     Cardiovascular: Regular rate rhythm, no peripheral edema, warm, nontender. Eyes: Conjunctivae clear without exudates or hemorrhage Neck: Supple, no carotid bruits. Pulmonary: Clear to auscultation bilaterally   NEUROLOGICAL EXAM:  Animal naming 16  MMSE - Mini Mental State Exam 06/20/2016  Orientation to  time 5  Orientation to Place 5  Registration 3  Attention/ Calculation 5  Recall 3  Language- name 2 objects 2  Language- repeat 1  Language- follow 3 step command 3  Language- read & follow direction 1  Write a sentence 1  Copy design 1  Total score 30   Mini-Mental Status  CRANIAL NERVES: CN II: Visual fields are full to confrontation. Fundoscopic exam is normal with sharp discs and no vascular changes. Pupils are round equal and briskly reactive to light. CN III, IV, VI: extraocular movement are normal. No ptosis. CN V: Facial sensation is intact to pinprick in all 3 divisions bilaterally. Corneal responses are intact.  CN VII: Face is symmetric with normal eye closure and smile. CN VIII: Hearing is normal to rubbing fingers CN IX, X: Palate elevates symmetrically. Phonation is normal. CN XI: Head turning and shoulder shrug are intact CN XII: Tongue is midline with normal movements and no atrophy.  MOTOR: There is no pronator drift of out-stretched arms. Muscle bulk and tone are normal. Muscle strength is normal.  REFLEXES: Reflexes are 2+ and symmetric at the biceps, triceps, knees, and ankles. Plantar responses are flexor.  SENSORY: Intact to light touch, pinprick, positional sensation and vibratory sensation are intact in  fingers and toes.  COORDINATION: Rapid alternating movements and fine finger movements are intact. There is no dysmetria on finger-to-nose and heel-knee-shin.    GAIT/STANCE: Posture is normal. Gait is steady with normal steps, base, arm swing, and turning. Heel and toe walking are normal. Tandem gait is normal.  Romberg is absent.   DIAGNOSTIC DATA (LABS, IMAGING, TESTING) - I reviewed patient records, labs, notes, testing and imaging myself where available.   ASSESSMENT AND PLAN  Dashia MARIKO NOWAKOWSKI is a 66 y.o. female   Recurrent episode of sudden onset dizziness  She has multiple vascular risk factors, include longtime smoker, previous coronary artery disease, hypertension, hyperlipidemia, no history of carotid artery disease  Differentiation diagnosis of her complains includes posterior circulation insufficiency, versus hypoperfusion of the brain due to high-grade bilateral internal carotid artery stenosis  Bilateral internal carotid artery stenosis  Will further characterize the stenosis with CT angiogram  Ultrasound of carotid artery in September 2017 showed greater than 70% stenosis in the right, and left internal carotid artery  Continue Plavix daily  Chronic low back pain, radiating pain to bilateral lower extremity, bilateral feet paresthesia,   There is evidence of slight bilateral L4-L5 radiculopathy  Complete evaluation with MRI of lumbar   Marcial Pacas, M.D. Ph.D.  [facility] [address] [place], [place] [address] Ph: [phone] Fax: [phone]  CC: Redmond School, MD

## 2016-08-10 ENCOUNTER — Encounter: Payer: Self-pay | Admitting: Cardiology

## 2016-08-10 ENCOUNTER — Ambulatory Visit (INDEPENDENT_AMBULATORY_CARE_PROVIDER_SITE_OTHER): Payer: Medicare Other | Admitting: Cardiology

## 2016-08-10 VITALS — BP 128/60 | HR 86 | Ht 64.0 in | Wt 222.0 lb

## 2016-08-10 DIAGNOSIS — I10 Essential (primary) hypertension: Secondary | ICD-10-CM | POA: Diagnosis not present

## 2016-08-10 DIAGNOSIS — Z86711 Personal history of pulmonary embolism: Secondary | ICD-10-CM

## 2016-08-10 DIAGNOSIS — R400 Somnolence: Secondary | ICD-10-CM

## 2016-08-10 DIAGNOSIS — I6523 Occlusion and stenosis of bilateral carotid arteries: Secondary | ICD-10-CM

## 2016-08-10 DIAGNOSIS — I2510 Atherosclerotic heart disease of native coronary artery without angina pectoris: Secondary | ICD-10-CM

## 2016-08-10 DIAGNOSIS — R55 Syncope and collapse: Secondary | ICD-10-CM | POA: Diagnosis not present

## 2016-08-10 NOTE — Assessment & Plan Note (Signed)
Suspected sleep apnea

## 2016-08-10 NOTE — Patient Instructions (Signed)
Your physician recommends that you schedule a follow-up appointment in: after all tests are complete  Your physician has requested that you have an echocardiogram. Echocardiography is a painless test that uses sound waves to create images of your heart. It provides your doctor with information about the size and shape of your heart and how well your heart's chambers and valves are working. This procedure takes approximately one hour. There are no restrictions for this procedure.  Your physician has recommended that you wear an event monitor. Event monitors are medical devices that record the heart's electrical activity. Doctors most often [place] these monitors to diagnose arrhythmias. Arrhythmias are problems with the speed or rhythm of the heartbeat. The monitor is a small, portable device. You can wear one while you do your normal daily activities. This is usually used to diagnose what is causing palpitations/syncope (passing out).  Your physician has recommended that you have a sleep study. This test records several body functions during sleep, including: brain activity, eye movement, oxygen and carbon dioxide blood levels, heart rate and rhythm, breathing rate and rhythm, the flow of air through your mouth and nose, snoring, body muscle movements, and chest and belly movement.  Your physician recommends that you continue on your current medications as directed. Please refer to the Current Medication list given to you today.    Thank you for choosing [facility] !

## 2016-08-10 NOTE — Assessment & Plan Note (Signed)
Since Feb 2018 the pt has had near syncope and syncope and collapse

## 2016-08-10 NOTE — Assessment & Plan Note (Signed)
Moderate CAD with 60% RCA in Oct 2016- normal LVF- medical Rx No angina

## 2016-08-10 NOTE — Assessment & Plan Note (Signed)
She was not orthostatic after 3 minutes standing in the offcie

## 2016-08-10 NOTE — Assessment & Plan Note (Signed)
Moderate ICA disease, probably not contributing to her syncope

## 2016-08-10 NOTE — Assessment & Plan Note (Addendum)
Hx of DVT and PE- > 10 yrs ago. No evidence of PE or DVT by recent studies. She has been on chronic anticoagulation >67yrs

## 2016-08-10 NOTE — Progress Notes (Signed)
08/10/2016 Jennifer [name]   04-16-1950  [id number]  Primary Physician Redmond School, MD Primary Cardiologist: Dr Grace Bushy  HPI:  66 y/o obese female with a history of remote PE and DVT, on chronic anticoagulation for several years, moderate CAD by cath Oct 2016 with normal LVF, obesity, HTN, and moderate carotid disease, seen in the office today for evaluation of syncope and near syncope. The pt describes "dizzy spells" that come on suddenly. They do not appear to be orthostatic in nature nor vagal. She can sometimes tell she is going down and either sits down or lays down. She denies any palpitations or chest pain but has gotten "sweaty" on some occasions. A few times she says she has actually blacked out and found herself on the floor. She has not suffered any significant injuries with these episodes.  She was evalutated by Neurology. She does have moderate ICA disease but no obvious cause for these episodes. She tells me she can have a few episodes a week.   Current Outpatient Prescriptions  Medication Sig Dispense Refill  . amLODipine (NORVASC) 5 MG tablet Take 5 mg by mouth daily.    . clopidogrel (PLAVIX) 75 MG tablet Take 1 tablet (75 mg total) by mouth daily. 30 tablet 11  . diazepam (VALIUM) 2 MG tablet Take 2 mg by mouth daily as needed for anxiety.     . DULoxetine (CYMBALTA) 30 MG capsule Take 30 mg by mouth daily.    Marland Kitchen gabapentin (NEURONTIN) 600 MG tablet Take 300-600 mg by mouth See admin instructions. Take 300 mg twice daily and 600 mg at bedtime.    Marland Kitchen lisinopril-hydrochlorothiazide (PRINZIDE,ZESTORETIC) 20-25 MG per tablet Take 1 tablet by mouth daily.      . Oxycodone HCl 10 MG TABS Take 10 mg by mouth 3 (three) times daily as needed (pain).    Marland Kitchen PROAIR HFA 108 (90 Base) MCG/ACT inhaler Inhale 1 puff into the lungs daily as needed for shortness of breath.    . rivaroxaban (XARELTO) 20 MG TABS tablet Take 20 mg by mouth daily with supper.    . Vitamin D, Ergocalciferol,  (DRISDOL) 50000 units CAPS capsule Take one capsule weekly x 8 weeks then convert to OTC Vitamin D 2000 units daily thereafter. 8 capsule 0   No current facility-administered medications for this visit.     Allergies  Allergen Reactions  . Bee Venom Anaphylaxis    Past Medical History:  Diagnosis Date  . CAD (coronary artery disease)    a. Cath 12/2014 - mild-moderate non-obstructive CAD with 60% mid RCA, 50% mid Cx-distal Cx, 30% distal LAD, 25% D2, EF 55-65%.  . Depression   . Dizziness   . DVT (deep venous thrombosis) ([place])   . Hyperlipidemia   . Hypertension   . PE (pulmonary embolism)   . Tobacco abuse     Social History   Social History  . Marital status: Divorced    Spouse name: N/A  . Number of children: 2  . Years of education: 12   Occupational History  . Retired    Social History Main Topics  . Smoking status: Current Every Day Smoker    Packs/day: 1.00    Types: Cigarettes  . Smokeless tobacco: Never Used  . Alcohol use No  . Drug use: No  . Sexual activity: Not on file   Other Topics Concern  . Not on file   Social History Narrative   Lives at home alone.   Right-handed.  2 cups caffeine per day.     Family History  Problem Relation Age of Onset  . Heart attack Mother   . Heart attack Father   . Heart attack Brother        CABG  . Heart attack Brother        CABG  . Heart attack Brother        CABG  . Ovarian cancer Maternal Grandmother   . Heart disease Maternal Grandfather   . Dementia Paternal Grandmother      Review of Systems: General: negative for chills, fever, night sweats or weight changes.  Cardiovascular: negative for chest pain, dyspnea on exertion, edema, orthopnea, palpitations, paroxysmal nocturnal dyspnea or shortness of breath Dermatological: negative for rash Respiratory: negative for cough or wheezing Urologic: negative for hematuria Abdominal: negative for nausea, vomiting, diarrhea, bright red blood per  rectum, melena, or hematemesis Neurologic: negative for visual changes All other systems reviewed and are otherwise negative except as noted above.    Blood pressure 128/60, pulse 86, height 5\' 4"  (1.626 m), weight 222 lb (100.7 kg), SpO2 99 %.  General appearance: alert, cooperative, no distress and moderately obese Neck: no JVD and bilateral carotid bruit Lungs: clear to auscultation bilaterally Heart: regular rate and rhythm and 2/6 systolic murmur AOV Abdomen: obese Extremities: extremities normal, atraumatic, no cyanosis or edema Pulses: 2+ and symmetric Skin: Skin color, texture, turgor normal. No rashes or lesions Neurologic: Grossly normal  EKG NSR, poor anterior RW  ASSESSMENT AND PLAN:   Syncope and collapse Since Feb 2018 the pt has had near syncope and syncope and collapse  Essential hypertension She was not orthostatic after 3 minutes standing in the offcie  CAD in native artery Moderate CAD with 60% RCA in Oct 2016- normal LVF- medical Rx No angina  Carotid artery stenosis Moderate ICA disease, probably not contributing to her syncope  History of pulmonary embolus (PE) Hx of DVT and PE- > 10 yrs ago. No evidence of PE or DVT by recent studies. She has been on chronic anticoagulation >22yrs  Morbid obesity ([place]) Suspected sleep apnea   PLAN  R/O arrhythmia as cause. Check echo and two week event monitor. I have instructed her not to drive until we get this sorted out. I also ordered a sleep study, she has HTN, obesity, and has daytime sleepiness- "I can fall asleep anytime during the day".   Kerin Ransom PA-C 08/10/2016 2:33 PM

## 2016-08-14 ENCOUNTER — Ambulatory Visit (HOSPITAL_COMMUNITY)
Admission: RE | Admit: 2016-08-14 | Discharge: 2016-08-14 | Disposition: A | Discharge status: Home / Self Care | Payer: Medicare Other | Source: Ambulatory Visit | Attending: Cardiology | Admitting: Cardiology

## 2016-08-14 DIAGNOSIS — I350 Nonrheumatic aortic (valve) stenosis: Secondary | ICD-10-CM | POA: Insufficient documentation

## 2016-08-14 DIAGNOSIS — R55 Syncope and collapse: Secondary | ICD-10-CM | POA: Diagnosis not present

## 2016-08-14 LAB — ECHOCARDIOGRAM COMPLETE
AO mean calculated velocity dopler: 125 cm/s
AV Area VTI index: 0.94 cm2/m2
AV Area VTI: 1.92 cm2
AV Area mean vel: 2.07 cm2
AV Mean grad: 8 mmHg
AV Peak grad: 15 mmHg
AV VEL mean LVOT/AV: 0.82
AV area mean vel ind: 0.95 cm2/m2
AV peak Index: 0.88
AV pk vel: 194 cm/s
AV vel: 2.05
Ao pk vel: 0.76 m/s
E decel time: 285 msec
E/e' ratio: 11.01
FS: 53 % — AB (ref 28–44)
IVS/LV PW RATIO, ED: 0.91
LA ID, A-P, ES: 38 mm
LA diam end sys: 38 mm
LA diam index: 1.74 cm/m2
LA vol A4C: 45 ml
LA vol index: 20.3 mL/m2
LA vol: 44.3 mL
LV E/e' medial: 11.01
LV E/e'average: 11.01
LV PW d: 11.6 mm — AB (ref 0.6–1.1)
LV dias vol index: 27 mL/m2
LV dias vol: 60 mL (ref 46–106)
LV e' LATERAL: 9.03 cm/s
LV sys vol index: 6 mL/m2
LV sys vol: 12 mL — AB
LVOT SV: 92 mL
LVOT VTI: 36.1 cm
LVOT area: 2.54 cm2
LVOT diameter: 18 mm
LVOT peak VTI: 0.81 cm
LVOT peak grad rest: 9 mmHg
LVOT peak vel: 147 cm/s
Lateral S' vel: 13.5 cm/s
MV Dec: 285
MV Peak grad: 4 mmHg
MV pk A vel: 97.5 m/s
MV pk E vel: 99.4 m/s
RV sys press: 17 mmHg
Reg peak vel: 184 cm/s
Simpson's disk: 80
Stroke v: 47 ml
TAPSE: 20 mm
TDI e' lateral: 9.03
TDI e' medial: 5.87
TR max vel: 184 cm/s
VTI: 44.7 cm
Valve area index: 0.94
Valve area: 2.05 cm2

## 2016-08-14 NOTE — Progress Notes (Signed)
*  PRELIMINARY RESULTS* Echocardiogram 2D Echocardiogram has been performed.  Jennifer [name] 08/14/2016, 2:46 PM

## 2016-08-16 ENCOUNTER — Ambulatory Visit (INDEPENDENT_AMBULATORY_CARE_PROVIDER_SITE_OTHER): Payer: Medicare Other

## 2016-08-16 DIAGNOSIS — R55 Syncope and collapse: Secondary | ICD-10-CM

## 2016-08-29 ENCOUNTER — Ambulatory Visit: Payer: Medicare Other | Attending: Cardiology | Admitting: Neurology

## 2016-08-29 DIAGNOSIS — I10 Essential (primary) hypertension: Secondary | ICD-10-CM | POA: Diagnosis not present

## 2016-08-29 DIAGNOSIS — R400 Somnolence: Secondary | ICD-10-CM | POA: Diagnosis not present

## 2016-08-29 DIAGNOSIS — G4733 Obstructive sleep apnea (adult) (pediatric): Secondary | ICD-10-CM | POA: Diagnosis not present

## 2016-09-01 DIAGNOSIS — M545 Low back pain: Secondary | ICD-10-CM | POA: Diagnosis not present

## 2016-09-01 DIAGNOSIS — I739 Peripheral vascular disease, unspecified: Secondary | ICD-10-CM | POA: Diagnosis not present

## 2016-09-02 NOTE — Procedures (Signed)
Willards A. Merlene Laughter, MD     [email or web address]             NOCTURNAL POLYSOMNOGRAPHY   LOCATION: [facility]  Patient Name: Jennifer [name], Jennifer [name] Date: 08/29/2016 Gender: Female D.O.B: 11-Apr-1950 Age (years): 66 Referring Provider: Kerin Ransom Height (inches): 64 Interpreting Physician: Phillips Odor MD, ABSM Weight (lbs): 222 RPSGT: Rosebud Poles BMI: 38 MRN: [id number] Neck Size: 16.50 CLINICAL INFORMATION Sleep Study Type: NPSG  Indication for sleep study: Daytime Fatigue, Hypertension  Epworth Sleepiness Score: 15  SLEEP STUDY TECHNIQUE As per the AASM Manual for the Scoring of Sleep and Associated Events v2.3 (April 2016) with a hypopnea requiring 4% desaturations.  The channels recorded and monitored were frontal, central and occipital EEG, electrooculogram (EOG), submentalis EMG (chin), nasal and oral airflow, thoracic and abdominal wall motion, anterior tibialis EMG, snore microphone, electrocardiogram, and pulse oximetry.  MEDICATIONS Medications self-administered by patient taken the night of the study : N/A  Current Outpatient Prescriptions:  .  amLODipine (NORVASC) 5 MG tablet, Take 5 mg by mouth daily., Disp: , Rfl:  .  clopidogrel (PLAVIX) 75 MG tablet, Take 1 tablet (75 mg total) by mouth daily., Disp: 30 tablet, Rfl: 11 .  diazepam (VALIUM) 2 MG tablet, Take 2 mg by mouth daily as needed for anxiety. , Disp: , Rfl:  .  DULoxetine (CYMBALTA) 30 MG capsule, Take 30 mg by mouth daily., Disp: , Rfl:  .  gabapentin (NEURONTIN) 600 MG tablet, Take 300-600 mg by mouth See admin instructions. Take 300 mg twice daily and 600 mg at bedtime., Disp: , Rfl:  .  lisinopril-hydrochlorothiazide (PRINZIDE,ZESTORETIC) 20-25 MG per tablet, Take 1 tablet by mouth daily.  , Disp: , Rfl:  .  Oxycodone HCl 10 MG TABS, Take 10 mg by mouth 3 (three) times daily as needed (pain)., Disp: , Rfl:  .  PROAIR HFA 108 (90 Base) MCG/ACT inhaler, Inhale 1 puff into the  lungs daily as needed for shortness of breath., Disp: , Rfl:  .  rivaroxaban (XARELTO) 20 MG TABS tablet, Take 20 mg by mouth daily with supper., Disp: , Rfl:  .  Vitamin D, Ergocalciferol, (DRISDOL) 50000 units CAPS capsule, Take one capsule weekly x 8 weeks then convert to OTC Vitamin D 2000 units daily thereafter., Disp: 8 capsule, Rfl: 0   SLEEP ARCHITECTURE The study was initiated at 9:57:18 PM and ended at 5:11:41 AM.  Sleep onset time was 26.8 minutes and the sleep efficiency was 60.3%. The total sleep time was 262.0 minutes.  Stage REM latency was 368.0 minutes.  The patient spent 1.53% of the night in stage N1 sleep, 26.15% in stage N2 sleep, 57.44% in stage N3 and 14.89% in REM.  Alpha intrusion was absent.  Supine sleep was 0.00%.  RESPIRATORY PARAMETERS The overall apnea/hypopnea index (AHI) was 0.7 per hour. There were 0 total apneas, including 0 obstructive, 0 central and 0 mixed apneas. There were 3 hypopneas and 20 RERAs.  The AHI during Stage REM sleep was 3.1 per hour.  AHI while supine was N/A per hour.  The mean oxygen saturation was 94.10%. The minimum SpO2 during sleep was 90.00%.  Soft snoring was noted during this study.  CARDIAC DATA The 2 lead EKG demonstrated sinus rhythm. The mean heart rate was N/A beats per minute. Other EKG findings include: PVCs. LEG MOVEMENT DATA The total PLMS were 822 with a resulting PLMS index of 188.24. Associated arousal with leg movement index was 14.4.  IMPRESSIONS - Severe  periodic limb movements of sleep occurred during the study. Associated arousals were significant. - No significant obstructive sleep apnea occurred during this study. - No significant central sleep apnea occurred during this study.   Delano Metz, MD Diplomate, American Board of Sleep Medicine.  ELECTRONICALLY SIGNED ON:  09/02/2016, 11:12 PM [place] PH: [phone]   FX: [phone] [place]

## 2016-09-07 DIAGNOSIS — Z1211 Encounter for screening for malignant neoplasm of colon: Secondary | ICD-10-CM | POA: Diagnosis not present

## 2016-09-19 ENCOUNTER — Ambulatory Visit
Admission: RE | Admit: 2016-09-19 | Discharge: 2016-09-19 | Disposition: A | Discharge status: Home / Self Care | Payer: Medicare Other | Source: Ambulatory Visit | Attending: Neurology | Admitting: Neurology

## 2016-09-19 DIAGNOSIS — I2699 Other pulmonary embolism without acute cor pulmonale: Secondary | ICD-10-CM

## 2016-09-19 DIAGNOSIS — R42 Dizziness and giddiness: Secondary | ICD-10-CM

## 2016-09-19 DIAGNOSIS — G8929 Other chronic pain: Secondary | ICD-10-CM

## 2016-09-19 DIAGNOSIS — I6521 Occlusion and stenosis of right carotid artery: Secondary | ICD-10-CM | POA: Diagnosis not present

## 2016-09-19 DIAGNOSIS — R269 Unspecified abnormalities of gait and mobility: Secondary | ICD-10-CM

## 2016-09-19 DIAGNOSIS — M545 Low back pain: Secondary | ICD-10-CM

## 2016-09-19 DIAGNOSIS — I824Z2 Acute embolism and thrombosis of unspecified deep veins of left distal lower extremity: Secondary | ICD-10-CM

## 2016-09-19 DIAGNOSIS — R200 Anesthesia of skin: Secondary | ICD-10-CM

## 2016-09-19 DIAGNOSIS — I6523 Occlusion and stenosis of bilateral carotid arteries: Secondary | ICD-10-CM

## 2016-09-19 DIAGNOSIS — I6529 Occlusion and stenosis of unspecified carotid artery: Secondary | ICD-10-CM | POA: Diagnosis not present

## 2016-09-19 MED ORDER — IOPAMIDOL (ISOVUE-370) INJECTION 76%
75.0000 mL | Freq: Once | INTRAVENOUS | Status: AC | PRN
  Administered 2016-09-19: 75 mL via INTRAVENOUS

## 2016-09-20 ENCOUNTER — Telehealth: Payer: Self-pay | Admitting: Neurology

## 2016-09-20 NOTE — Telephone Encounter (Signed)
Diane with [facility] called office in reference to patients CT angio results done last night.  She just wanted to confirm we have the results and are needing to reach out to the patient.

## 2016-09-20 NOTE — Telephone Encounter (Signed)
Dr. Brett Fairy has reviewed the results and placed a referral to vascular surgery for this patient.  I called the patient and left a message asking her to return my call.

## 2016-09-20 NOTE — Telephone Encounter (Addendum)
Results received - provided to Dr. Brett Fairy (work-in MD) for further review.  Returned call to [facility] and left message for Diane letting her know we have the results.

## 2016-09-20 NOTE — Telephone Encounter (Signed)
I have attempted to reach patient two additional times this afternoon.  I have left messages to please call me back as soon as possible.  We have urgent test results to discuss with her.

## 2016-09-21 NOTE — Telephone Encounter (Signed)
I have contacted her sister-in-law, Valetta Fuller who is on her HIPAA form [phone]).  She is aware of the results and will go over them with the patient.  She will get in touch with the patient today and have her call our office.  She will ask to speak to Rance Muir so she can be given her appointment information with the vascular surgeon.

## 2016-09-21 NOTE — Telephone Encounter (Signed)
Left another message for patient to return my call.

## 2016-09-21 NOTE — Telephone Encounter (Signed)
Thank you, appointment note.CD  Send CC to dr Krista Blue.

## 2016-09-21 NOTE — Telephone Encounter (Signed)
Called and spoke to patient sister Rupert Stacks [phone]. Relayed [facility] apt is August 7 th 10:45 am   patient will have ultrasound. August 8 th Patient will see Dr. Scot Dock at 2:00 pm .    If [facility] has a CX [facility] will call Debbie to relay CX time.  Per nurse at [facility] if Dr. Beacher May would like patient seen sooner she can call Dr. Scot Dock [phone].

## 2016-09-24 ENCOUNTER — Telehealth: Payer: Self-pay | Admitting: Neurology

## 2016-09-24 NOTE — Telephone Encounter (Signed)
Vascular appt pending, follow up with me on August 20

## 2016-09-24 NOTE — Telephone Encounter (Signed)
Please call patient, severe L4-5 stensosis, with moderate biforaminal narrowing, will review films at her next follow up visit on August 20.  IMPRESSION:  Abnormal MRI lumbar spine (without) demonstrating: 1. At L4-5: disc and pseudo-disc bulging and facet hypertrophy with severe spinal stenosis and moderate biforaminal narrowing  2. At L3-4: disc bulging and facet hypertrophy with moderate spinal stenosis and moderate biforaminal narrowing  3. At L1-2, L2-3: disc bulging and facet hypertrophy with mild biforaminal narrowing  4. No significant change from MRI on 12/24/13.

## 2016-09-25 NOTE — Telephone Encounter (Signed)
Called and LVM for pt about MRI lumbar results. Advised per YY,MD note that MRI showed severe L4-L5 stenosis, with moderate biforaminal narrowing. She will review in detail at her next appt on 10/30/16. Advised her to call if she has further questions or concerns.

## 2016-09-26 DIAGNOSIS — M9903 Segmental and somatic dysfunction of lumbar region: Secondary | ICD-10-CM | POA: Diagnosis not present

## 2016-09-26 DIAGNOSIS — M5136 Other intervertebral disc degeneration, lumbar region: Secondary | ICD-10-CM | POA: Diagnosis not present

## 2016-09-26 DIAGNOSIS — M5441 Lumbago with sciatica, right side: Secondary | ICD-10-CM | POA: Diagnosis not present

## 2016-09-26 DIAGNOSIS — M47816 Spondylosis without myelopathy or radiculopathy, lumbar region: Secondary | ICD-10-CM | POA: Diagnosis not present

## 2016-09-29 DIAGNOSIS — M5136 Other intervertebral disc degeneration, lumbar region: Secondary | ICD-10-CM | POA: Diagnosis not present

## 2016-09-29 DIAGNOSIS — M47816 Spondylosis without myelopathy or radiculopathy, lumbar region: Secondary | ICD-10-CM | POA: Diagnosis not present

## 2016-09-29 DIAGNOSIS — M9903 Segmental and somatic dysfunction of lumbar region: Secondary | ICD-10-CM | POA: Diagnosis not present

## 2016-09-29 DIAGNOSIS — M5441 Lumbago with sciatica, right side: Secondary | ICD-10-CM | POA: Diagnosis not present

## 2016-10-02 DIAGNOSIS — M9903 Segmental and somatic dysfunction of lumbar region: Secondary | ICD-10-CM | POA: Diagnosis not present

## 2016-10-02 DIAGNOSIS — M5136 Other intervertebral disc degeneration, lumbar region: Secondary | ICD-10-CM | POA: Diagnosis not present

## 2016-10-02 DIAGNOSIS — M5441 Lumbago with sciatica, right side: Secondary | ICD-10-CM | POA: Diagnosis not present

## 2016-10-02 DIAGNOSIS — M47816 Spondylosis without myelopathy or radiculopathy, lumbar region: Secondary | ICD-10-CM | POA: Diagnosis not present

## 2016-10-03 ENCOUNTER — Encounter: Payer: Self-pay | Admitting: Vascular Surgery

## 2016-10-04 DIAGNOSIS — M9903 Segmental and somatic dysfunction of lumbar region: Secondary | ICD-10-CM | POA: Diagnosis not present

## 2016-10-04 DIAGNOSIS — M47816 Spondylosis without myelopathy or radiculopathy, lumbar region: Secondary | ICD-10-CM | POA: Diagnosis not present

## 2016-10-04 DIAGNOSIS — M5441 Lumbago with sciatica, right side: Secondary | ICD-10-CM | POA: Diagnosis not present

## 2016-10-04 DIAGNOSIS — M5136 Other intervertebral disc degeneration, lumbar region: Secondary | ICD-10-CM | POA: Diagnosis not present

## 2016-10-10 DIAGNOSIS — M9903 Segmental and somatic dysfunction of lumbar region: Secondary | ICD-10-CM | POA: Diagnosis not present

## 2016-10-10 DIAGNOSIS — M47816 Spondylosis without myelopathy or radiculopathy, lumbar region: Secondary | ICD-10-CM | POA: Diagnosis not present

## 2016-10-10 DIAGNOSIS — M5136 Other intervertebral disc degeneration, lumbar region: Secondary | ICD-10-CM | POA: Diagnosis not present

## 2016-10-10 DIAGNOSIS — M5441 Lumbago with sciatica, right side: Secondary | ICD-10-CM | POA: Diagnosis not present

## 2016-10-13 DIAGNOSIS — M5136 Other intervertebral disc degeneration, lumbar region: Secondary | ICD-10-CM | POA: Diagnosis not present

## 2016-10-13 DIAGNOSIS — M47816 Spondylosis without myelopathy or radiculopathy, lumbar region: Secondary | ICD-10-CM | POA: Diagnosis not present

## 2016-10-13 DIAGNOSIS — M5441 Lumbago with sciatica, right side: Secondary | ICD-10-CM | POA: Diagnosis not present

## 2016-10-13 DIAGNOSIS — M9903 Segmental and somatic dysfunction of lumbar region: Secondary | ICD-10-CM | POA: Diagnosis not present

## 2016-10-16 DIAGNOSIS — M5441 Lumbago with sciatica, right side: Secondary | ICD-10-CM | POA: Diagnosis not present

## 2016-10-16 DIAGNOSIS — M9903 Segmental and somatic dysfunction of lumbar region: Secondary | ICD-10-CM | POA: Diagnosis not present

## 2016-10-16 DIAGNOSIS — M5136 Other intervertebral disc degeneration, lumbar region: Secondary | ICD-10-CM | POA: Diagnosis not present

## 2016-10-16 DIAGNOSIS — M47816 Spondylosis without myelopathy or radiculopathy, lumbar region: Secondary | ICD-10-CM | POA: Diagnosis not present

## 2016-10-17 ENCOUNTER — Ambulatory Visit (HOSPITAL_COMMUNITY)
Admission: RE | Admit: 2016-10-17 | Discharge: 2016-10-17 | Disposition: A | Discharge status: Home / Self Care | Payer: Medicare Other | Source: Ambulatory Visit | Attending: Vascular Surgery | Admitting: Vascular Surgery

## 2016-10-17 DIAGNOSIS — I6522 Occlusion and stenosis of left carotid artery: Secondary | ICD-10-CM | POA: Diagnosis not present

## 2016-10-17 LAB — VAS US CAROTID
Left CCA dist dias: -22 cm/s
Left CCA dist sys: -99 cm/s
Left CCA prox dias: 12 cm/s
Left CCA prox sys: 84 cm/s
Left ICA dist dias: -33 cm/s
Left ICA dist sys: -82 cm/s
Left ICA prox dias: -53 cm/s
Left ICA prox sys: -194 cm/s

## 2016-10-18 ENCOUNTER — Ambulatory Visit (HOSPITAL_COMMUNITY)
Admission: RE | Admit: 2016-10-18 | Discharge: 2016-10-18 | Disposition: A | Discharge status: Home / Self Care | Payer: Medicare Other | Source: Ambulatory Visit | Attending: Vascular Surgery | Admitting: Vascular Surgery

## 2016-10-18 ENCOUNTER — Ambulatory Visit (INDEPENDENT_AMBULATORY_CARE_PROVIDER_SITE_OTHER): Payer: Medicare Other | Admitting: Vascular Surgery

## 2016-10-18 ENCOUNTER — Encounter: Payer: Self-pay | Admitting: Vascular Surgery

## 2016-10-18 VITALS — BP 130/54 | HR 66 | Temp 98.2°F | Ht 64.0 in | Wt 223.0 lb

## 2016-10-18 DIAGNOSIS — I6523 Occlusion and stenosis of bilateral carotid arteries: Secondary | ICD-10-CM

## 2016-10-18 NOTE — Progress Notes (Addendum)
Patient name: Jennifer [name] MRN: [id number] DOB: 1951-02-20 Sex: female   REASON FOR CONSULT:    Right carotid stenosis. Referred by Dohmeier.   HPI:   Jennifer [name] is a pleasant 66 y.o. female,  Who has a history of carotid disease. She was seen by [facility] with dizziness. A duplex scan in July 2018 showed evidence of a 70-99% right carotid stenosis with a 50-69% left carotid stenosis. As part of her workup she underwent a CT angiogram which I have reviewed and the results are discussed low. She was found to have a tight right carotid stenosis. She sent for carotid evaluation. I have reviewed the notes from [facility].  The patient is right-handed. She has been having some intermittent left arm weakness for the last 2-1/2 months. She describes some clumsiness and weakness in her hand which lasted briefly and resolves. She denies any right sided symptoms. She denies any expressive or receptive aphasia or amaurosis fugax. She also states that she's blacked out twice once in March and again in July. She is on Plavix but not on aspirin.  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  She does have a history of coronary artery disease and does admit to some dyspnea on exertion but his had no chest pain. She was last seen by Jennifer [name] cardiology in May of this year.  Past Medical History:  Diagnosis Date  . CAD (coronary artery disease)    a. Cath 12/2014 - mild-moderate non-obstructive CAD with 60% mid RCA, 50% mid Cx-distal Cx, 30% distal LAD, 25% D2, EF 55-65%.  . Depression   . Dizziness   . DVT (deep venous thrombosis) ([place])   . Hyperlipidemia   . Hypertension   . PE (pulmonary embolism)   . Tobacco abuse     Family History  Problem Relation Age of Onset  . Heart attack Mother   . Heart attack Father   . Heart attack Brother        CABG  . Heart attack Brother        CABG  .  Heart attack Brother        CABG  . Ovarian cancer Maternal Grandmother   . Heart disease Maternal Grandfather   . Dementia Paternal Grandmother     SOCIAL HISTORY: She smokes 1 pack per day of cigarettes. Social History   Social History  . Marital status: Divorced    Spouse name: N/A  . Number of children: 2  . Years of education: 12   Occupational History  . Retired    Social History Main Topics  . Smoking status: Current Every Day Smoker    Packs/day: 1.00    Types: Cigarettes  . Smokeless tobacco: Never Used  . Alcohol use No  . Drug use: No  . Sexual activity: Not on file   Other Topics Concern  . Not on file   Social History Narrative   Lives at home alone.   Right-handed.   2 cups caffeine per day.    Allergies  Allergen Reactions  . Bee Venom Anaphylaxis    Current Outpatient Prescriptions  Medication Sig Dispense Refill  . amLODipine (NORVASC) 5 MG tablet Take 5 mg by mouth daily.    . clopidogrel (PLAVIX) 75 MG tablet Take 1 tablet (75 mg total) by mouth daily. 30 tablet 11  . diazepam (VALIUM) 2 MG tablet Take  2 mg by mouth daily as needed for anxiety.     . DULoxetine (CYMBALTA) 30 MG capsule Take 30 mg by mouth daily.    Marland Kitchen gabapentin (NEURONTIN) 600 MG tablet Take 300-600 mg by mouth See admin instructions. Take 300 mg twice daily and 600 mg at bedtime.    Marland Kitchen lisinopril-hydrochlorothiazide (PRINZIDE,ZESTORETIC) 20-25 MG per tablet Take 1 tablet by mouth daily.      . Oxycodone HCl 10 MG TABS Take 10 mg by mouth 3 (three) times daily as needed (pain).    . rivaroxaban (XARELTO) 20 MG TABS tablet Take 20 mg by mouth daily with supper.    . Vitamin D, Ergocalciferol, (DRISDOL) 50000 units CAPS capsule Take one capsule weekly x 8 weeks then convert to OTC Vitamin D 2000 units daily thereafter. 8 capsule 0   No current facility-administered medications for this visit.     REVIEW OF SYSTEMS:  [X]  denotes positive finding, [ ]  denotes negative  finding Cardiac  Comments:  Chest pain or chest pressure:    Shortness of breath upon exertion: X   Short of breath when lying flat:    Irregular heart rhythm:        Vascular    Pain in calf, thigh, or hip brought on by ambulation:    Pain in feet at night that wakes you up from your sleep:     Blood clot in your veins:    Leg swelling:         Pulmonary    Oxygen at home:    Productive cough:     Wheezing:         Neurologic    Sudden weakness in arms or legs:     Sudden numbness in arms or legs:     Sudden onset of difficulty speaking or slurred speech:    Temporary loss of vision in one eye:     Problems with dizziness:  X       Gastrointestinal    Blood in stool:     Vomited blood:         Genitourinary    Burning when urinating:     Blood in urine:        Psychiatric    Major depression:  X       Hematologic    Bleeding problems:    Problems with blood clotting too easily: X       Skin    Rashes or ulcers:        Constitutional    Fever or chills:     PHYSICAL EXAM:   Vitals:   10/18/16 1413 10/18/16 1417  BP: 130/71 (!) 130/54  Pulse: 66   Temp: 98.2 F (36.8 C)   TempSrc: Oral   SpO2: 96%   Weight: 223 lb (101.2 kg)   Height: 5\' 4"  (1.626 m)     GENERAL: The patient is a well-nourished female, in no acute distress. The vital signs are documented above. CARDIAC: There is a regular rate and rhythm.  VASCULAR: She does have a right carotid bruit. She has palpable femoral pulses. Both feet are warm and well-perfused. She has mild bilateral lower extremity swelling. PULMONARY: There is good air exchange bilaterally without wheezing or rales. ABDOMEN: Soft and non-tender with normal pitched bowel sounds.  MUSCULOSKELETAL: There are no major deformities or cyanosis. NEUROLOGIC: No focal weakness or paresthesias are detected. SKIN: There are no ulcers or rashes noted. PSYCHIATRIC: The patient has a normal affect.  DATA:    CT ANGIOGRAM NECK:  Her CT angiogram of the neck shows a tight right carotid stenosis that is significantly calcified. She has a high bifurcation and the plaque extends well up into the internal carotid artery. I do not think that this is surgically accessible. She had a moderate left carotid stenosis. The aortic arch is significantly calcified.  CAROTID DUPLEX: I reviewed the carotid duplex scan that was done on 09/19/2016 by [facility] imaging. This showed a 70-99% right carotid stenosis with a 50-69% left carotid stenosis.  CAROTID DUPLEX: I have independently interpreted the limited carotid duplex of the right carotid artery that was done today in our office. This shows a 60-79% right carotid stenosis. Peak systolic velocity is 403 cm/s and end-diastolic velocity is 95 cm/s.  MEDICAL ISSUES:   SYMPTOMATIC RIGHT CAROTID STENOSIS: This patient has a significant right carotid stenosis by duplex and CT angiogram. However I do not think that the stenosis is surgically accessible. Likewise she is not a good candidate for routine carotid stenting given the significant calcific disease in the aorta. She might potentially be a candidate for a TCAR (Trans Carotid Artery Revascularization). I will arrange for a visit with Dr. Ruta Hinds to determine if she is a candidate. She has been having intermittent left arm symptoms I think the stenosis is symptomatic. Without revascularization I think her risk of stroke is 10% per year.  I think that she should undergo preoperative cardiac clearance given her history of coronary artery disease.  Her Alen Blew would need to be stopped prior to the procedure. She is on Plavix.  Deitra Mayo [facility] [phone]   Dear Dr. Doren Custard, I will share your note and thoughts with Dr. Krista Blue, who has followed this patient. Thank You for the evaluation.   Larey Seat, MD

## 2016-10-19 ENCOUNTER — Other Ambulatory Visit: Payer: Self-pay

## 2016-10-19 ENCOUNTER — Telehealth: Payer: Self-pay | Admitting: Vascular Surgery

## 2016-10-19 DIAGNOSIS — M9903 Segmental and somatic dysfunction of lumbar region: Secondary | ICD-10-CM | POA: Diagnosis not present

## 2016-10-19 DIAGNOSIS — M5441 Lumbago with sciatica, right side: Secondary | ICD-10-CM | POA: Diagnosis not present

## 2016-10-19 DIAGNOSIS — M5136 Other intervertebral disc degeneration, lumbar region: Secondary | ICD-10-CM | POA: Diagnosis not present

## 2016-10-19 DIAGNOSIS — M47816 Spondylosis without myelopathy or radiculopathy, lumbar region: Secondary | ICD-10-CM | POA: Diagnosis not present

## 2016-10-19 NOTE — Telephone Encounter (Signed)
-----   Message from Mena Goes, RN sent at 10/19/2016  9:32 AM EDT ----- Regarding: FW: Scheduled for office visit w/ Dr. Oneida Alar asap   ----- Message ----- From: Angelia Mould, MD Sent: 10/18/2016   6:37 PM To: [facility] Charge Pool Subject: Scheduled for office visit                     Epic was down during her visit today and I was unable to review her CT angiogram while she was here. I was considering right carotid endarterectomy after preoperative cardiac clearance by [facility] cardiology. However, after reviewing her CT angiogram I do not think that the stenosis on the right is surgically accessible. In addition she has a markedly calcified aortic arch and may not be a candidate for carotid stenting. She might potentially be a candidate for TCAR and therefore she needs an office visit with Dr. Oneida Alar in order to determine if she is a candidate. She will also need preoperative cardiac clearance by [facility] cardiology.  I was unable to locate her number to call her tonight to discuss this. CD

## 2016-10-19 NOTE — Telephone Encounter (Signed)
Sched [facility] appt 10/26/16 at 3:00. Spoke to pt.  Pt saw Dr. Rosalyn Gess at [facility] on 07/2016. Faxed a clearance form to [phone].

## 2016-10-25 ENCOUNTER — Encounter: Payer: Self-pay | Admitting: Vascular Surgery

## 2016-10-25 NOTE — Pre-Procedure Instructions (Signed)
Jennifer [name]  10/25/2016      [facility] - [place], [address] [place] [address] Phone: [phone] Fax: [phone]    Your procedure is scheduled on August 21  Report to [place] at [place].M.  Call this number if you have problems the morning of surgery:  [phone]   Remember:  Do not eat food or drink liquids after midnight.  Continue all other medications as directed by your physician except follow these instructions about you medications   Take these medicines the morning of surgery with A SIP OF WATER amLODipine (NORVASC), diazepam (VALIUM), DULoxetine (CYMBALTA), gabapentin (NEURONTIN), ranitidine (ZANTAC)  If needed,   7 days prior to surgery STOP taking any Aspirin, Aleve, Naproxen, Ibuprofen, Motrin, Advil, Goody's, BC's, all herbal medications, fish oil, and all vitamins  Follow Physicians instructions about rivaroxaban (XARELTO) and clopidogrel (PLAVIX)   Do not wear jewelry, make-up or nail polish.  Do not wear lotions, powders, or perfumes, or deoderant.  Do not shave 48 hours prior to surgery.    Do not bring valuables to the hospital.  [facility] is not responsible for any belongings or valuables.  Contacts, dentures or bridgework may not be worn into surgery.  Leave your suitcase in the car.  After surgery it may be brought to your room.  For patients admitted to the hospital, discharge time will be determined by your treatment team.  Patients discharged the day of surgery will not be allowed to drive home.    Special instructions:   [place]- Preparing For Surgery  Before surgery, you can play an important role. Because skin is not sterile, your skin needs to be as free of germs as possible. You can reduce the number of germs on your skin by washing with CHG (chlorahexidine gluconate) Soap before surgery.  CHG is an antiseptic cleaner which kills germs and bonds with the skin to  continue killing germs even after washing.  Please do not use if you have an allergy to CHG or antibacterial soaps. If your skin becomes reddened/irritated stop using the CHG.  Do not shave (including legs and underarms) for at least 48 hours prior to first CHG shower. It is OK to shave your face.  Please follow these instructions carefully.   1. Shower the NIGHT BEFORE SURGERY and the MORNING OF SURGERY with CHG.   2. If you chose to wash your hair, wash your hair first as usual with your normal shampoo.  3. After you shampoo, rinse your hair and body thoroughly to remove the shampoo.  4. Use CHG as you would any other liquid soap. You can apply CHG directly to the skin and wash gently with a scrungie or a clean washcloth.   5. Apply the CHG Soap to your body ONLY FROM THE NECK DOWN.  Do not use on open wounds or open sores. Avoid contact with your eyes, ears, mouth and genitals (private parts). Wash genitals (private parts) with your normal soap.  6. Wash thoroughly, paying special attention to the area where your surgery will be performed.  7. Thoroughly rinse your body with warm water from the neck down.  8. DO NOT shower/wash with your normal soap after using and rinsing off the CHG Soap.  9. Pat yourself dry with a CLEAN TOWEL.   10. Wear CLEAN PAJAMAS   11. Place CLEAN SHEETS on your bed the night of your first  shower and DO NOT SLEEP WITH PETS.    Day of Surgery: Do not apply any deodorants/lotions. Please wear clean clothes to the hospital/surgery center.      Please read over the following fact sheets that you were given.

## 2016-10-26 ENCOUNTER — Ambulatory Visit (INDEPENDENT_AMBULATORY_CARE_PROVIDER_SITE_OTHER): Payer: Medicare Other | Admitting: Vascular Surgery

## 2016-10-26 ENCOUNTER — Encounter (HOSPITAL_COMMUNITY)
Admission: RE | Admit: 2016-10-26 | Discharge: 2016-10-26 | Disposition: A | Discharge status: Home / Self Care | Payer: Medicare Other | Source: Ambulatory Visit | Attending: Vascular Surgery | Admitting: Vascular Surgery

## 2016-10-26 ENCOUNTER — Encounter (HOSPITAL_COMMUNITY): Payer: Self-pay

## 2016-10-26 ENCOUNTER — Encounter: Payer: Self-pay | Admitting: Vascular Surgery

## 2016-10-26 VITALS — BP 138/80 | HR 78 | Temp 98.4°F | Resp 16 | Ht 64.0 in | Wt 220.0 lb

## 2016-10-26 DIAGNOSIS — I6521 Occlusion and stenosis of right carotid artery: Secondary | ICD-10-CM | POA: Insufficient documentation

## 2016-10-26 DIAGNOSIS — Z7901 Long term (current) use of anticoagulants: Secondary | ICD-10-CM | POA: Diagnosis not present

## 2016-10-26 DIAGNOSIS — Z01818 Encounter for other preprocedural examination: Secondary | ICD-10-CM | POA: Diagnosis not present

## 2016-10-26 HISTORY — DX: Unspecified osteoarthritis, unspecified site: M19.90

## 2016-10-26 HISTORY — DX: Anxiety disorder, unspecified: F41.9

## 2016-10-26 HISTORY — DX: Dyspnea, unspecified: R06.00

## 2016-10-26 HISTORY — DX: Gastro-esophageal reflux disease without esophagitis: K21.9

## 2016-10-26 HISTORY — DX: Polyneuropathy, unspecified: G62.9

## 2016-10-26 HISTORY — DX: Cardiac murmur, unspecified: R01.1

## 2016-10-26 HISTORY — DX: Congenital malformation of kidney, unspecified: Q63.9

## 2016-10-26 LAB — CBC
HCT: 44.6 % (ref 36.0–46.0)
Hemoglobin: 14.7 g/dL (ref 12.0–15.0)
MCH: 28.3 pg (ref 26.0–34.0)
MCHC: 33 g/dL (ref 30.0–36.0)
MCV: 85.8 fL (ref 78.0–100.0)
Platelets: 304 10*3/uL (ref 150–400)
RBC: 5.2 MIL/uL — ABNORMAL HIGH (ref 3.87–5.11)
RDW: 14.1 % (ref 11.5–15.5)
WBC: 8.8 10*3/uL (ref 4.0–10.5)

## 2016-10-26 LAB — COMPREHENSIVE METABOLIC PANEL
ALT: 13 U/L — ABNORMAL LOW (ref 14–54)
AST: 26 U/L (ref 15–41)
Albumin: 4.1 g/dL (ref 3.5–5.0)
Alkaline Phosphatase: 70 U/L (ref 38–126)
Anion gap: 11 (ref 5–15)
BUN: 9 mg/dL (ref 6–20)
CO2: 27 mmol/L (ref 22–32)
Calcium: 9.7 mg/dL (ref 8.9–10.3)
Chloride: 104 mmol/L (ref 101–111)
Creatinine, Ser: 1.15 mg/dL — ABNORMAL HIGH (ref 0.44–1.00)
GFR calc Af Amer: 56 mL/min — ABNORMAL LOW (ref >60)
GFR calc non Af Amer: 48 mL/min — ABNORMAL LOW (ref >60)
Glucose, Bld: 98 mg/dL (ref 65–99)
Potassium: 3.4 mmol/L — ABNORMAL LOW (ref 3.5–5.1)
Sodium: 142 mmol/L (ref 135–145)
Total Bilirubin: 0.5 mg/dL (ref 0.3–1.2)
Total Protein: 6.8 g/dL (ref 6.5–8.1)

## 2016-10-26 LAB — URINALYSIS, ROUTINE W REFLEX MICROSCOPIC
Bilirubin Urine: NEGATIVE
Glucose, UA: NEGATIVE mg/dL
Hgb urine dipstick: NEGATIVE
Ketones, ur: NEGATIVE mg/dL
Leukocytes, UA: NEGATIVE
Nitrite: NEGATIVE
Protein, ur: NEGATIVE mg/dL
Specific Gravity, Urine: 1.013 (ref 1.005–1.030)
pH: 5 (ref 5.0–8.0)

## 2016-10-26 LAB — TYPE AND SCREEN
ABO/RH(D): A POS
Antibody Screen: NEGATIVE

## 2016-10-26 LAB — PROTIME-INR
INR: 1.33
Prothrombin Time: 16.5 seconds — ABNORMAL HIGH (ref 11.4–15.2)

## 2016-10-26 LAB — APTT: aPTT: 31 seconds (ref 24–36)

## 2016-10-26 LAB — SURGICAL PCR SCREEN
MRSA, PCR: NEGATIVE
Staphylococcus aureus: NEGATIVE

## 2016-10-26 LAB — ABO/RH: ABO/RH(D): A POS

## 2016-10-26 NOTE — Progress Notes (Signed)
  Referring Physician: Chris Dickson, MD Patient name: Jennifer [name] MRN: [id number] DOB: 05/27/1950 Sex: female  REASON FOR CONSULT: Consideration for TCAR carotid stenting right side  HPI: Jennifer [name] is a 66 y.o. female  referred by Dr. Dickson for evaluation for possible TCAR. The patient was recently seen for possible symptomatic right internal carotid artery stenosis with left arm weakness symptoms. He apparently had some dizziness as well and states she may have even had 2 syncopal events. Carotid duplex in July 2018 showed 70-99% right carotid stenosis and 50-70% left carotid stenosis. On CT Angio was thought that her stenosis was a high lesion that may be difficult to access from a transcervical approach. The patient currently is on Xarelto for prior DVT and pulmonary embolus. 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 Plavix was recently added to this. She has had no bleeding problems so far. Other medical problems include coronary artery disease followed by [facility] and recently seen in May 2018. Cardiology risk stratification for intervention is currently pending. She has a single kidney. She also has a history of hypertension hyperlipidemia and neuropathy all of which are currently stable. She currently smokes 1 pack of cigarettes per day. Brain MRI from April 2018 showed no evidence of stroke.  Past Medical History:  Diagnosis Date  . Anxiety   . Arthritis   . CAD (coronary artery disease)    a. Cath 12/2014 - mild-moderate non-obstructive CAD with 60% mid RCA, 50% mid Cx-distal Cx, 30% distal LAD, 25% D2, EF 55-65%.  . Depression   . Dizziness   . DVT (deep venous thrombosis) ([facility])   . Dyspnea    W/ EXERTION  . GERD (gastroesophageal reflux disease)   . Headache   . Heart murmur    DX   AT BIRTH  . Hyperlipidemia   . Hypertension   . 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 Neuropathy   . PE (pulmonary  embolism)   . Tobacco abuse    Past Surgical History:  Procedure Laterality Date  . ABDOMINAL HYSTERECTOMY    . CARDIAC CATHETERIZATION N/A 01/08/2015   Procedure: Left Heart Cath and Coronary Angiography;  Surgeon: Daniel R Bensimhon, MD;  Location: [facility];  Service: Cardiovascular;  Laterality: N/A;  . CARPAL TUNNEL RELEASE     RIGHT  . KNEE ARTHROSCOPY W/ MENISCAL REPAIR     LEFT KNEE   FORMED DVT (SURG TO REMOVE BLOOD CLOT)  . TONSILLECTOMY      Family History  Problem Relation Age of Onset  . Heart attack Mother   . Heart attack Father   . Heart attack Brother        CABG  . Heart attack Brother        CABG  . Heart attack Brother        CABG  . Ovarian cancer Maternal Grandmother   . Heart disease Maternal Grandfather   . Dementia Paternal Grandmother     SOCIAL HISTORY: Social History   Social History  . Marital status: Divorced    Spouse name: N/A  . Number of children: 2  . Years of education: 12   Occupational History  . Retired    Social History Main Topics  . Smoking status: Current Every Day Smoker    Packs/day: 1.00    Types: Cigarettes  . Smokeless tobacco: Never Used       Comment: Down to 1/2 pk per day.   . Alcohol use No  . Drug use: No  . Sexual activity: Not on file   Other Topics Concern  . Not on file   Social History Narrative   Lives at home alone.   Right-handed.   2 cups caffeine per day.    Allergies  Allergen Reactions  . Bee Venom Anaphylaxis    Current Outpatient Prescriptions  Medication Sig Dispense Refill  . acetaminophen (TYLENOL 8 HOUR) 650 MG CR tablet Take 650 mg by mouth every 8 (eight) hours as needed for pain.    . amLODipine (NORVASC) 5 MG tablet Take 5 mg by mouth daily.    . aspirin 81 MG chewable tablet Chew 81 mg by mouth daily.    . Cholecalciferol (VITAMIN D) 2000 units CAPS Take 2,000 Units by mouth daily.    . clopidogrel (PLAVIX) 75 MG tablet Take 1 tablet (75 mg total) by mouth daily.  30 tablet 11  . diazepam (VALIUM) 2 MG tablet Take 2 mg by mouth daily as needed for anxiety.     . DULoxetine (CYMBALTA) 30 MG capsule Take 30 mg by mouth daily.    . gabapentin (NEURONTIN) 600 MG tablet Take 300-600 mg by mouth See admin instructions. Take 300 mg twice daily IN THE MORNING AND AT NOON AND  600 mg at bedtime.    . lisinopril-hydrochlorothiazide (PRINZIDE,ZESTORETIC) 20-25 MG per tablet Take 1 tablet by mouth daily.      . Menthol-Methyl Salicylate (MUSCLE RUB) 10-15 % CREA Apply 1 application topically 2 (two) times daily as needed for muscle pain.    . ranitidine (ZANTAC) 150 MG tablet Take 150 mg by mouth daily as needed for heartburn.    . rivaroxaban (XARELTO) 20 MG TABS tablet Take 20 mg by mouth daily with supper.    . Vitamin D, Ergocalciferol, (DRISDOL) 50000 units CAPS capsule Take one capsule weekly x 8 weeks then convert to OTC Vitamin D 2000 units daily thereafter. 8 capsule 0   No current facility-administered medications for this visit.     ROS:   General:  No weight loss, Fever, chills  HEENT: No recent headaches, no nasal bleeding, no visual changes, no sore throat  Neurologic: No dizziness, blackouts, seizures. +recent symptoms of stroke or mini- stroke. No recent episodes of slurred speech, or temporary blindness.  Cardiac: No recent episodes of chest pain/pressure, no shortness of breath at rest.  +shortness of breath with exertion.  Denies history of atrial fibrillation or irregular heartbeat  Vascular: No history of rest pain in feet.  No history of claudication.  No history of non-healing ulcer, No history of DVT   Pulmonary: No home oxygen, no productive cough, no hemoptysis,  No asthma or wheezing  Musculoskeletal:  [X] Arthritis, [ ] Low back pain,  [X] Joint pain  Hematologic:No history of hypercoagulable state.  No history of easy bleeding.  No history of anemia  Gastrointestinal: No hematochezia or melena,  No gastroesophageal reflux, no  trouble swallowing  Urinary: [ ] chronic Kidney disease, [ ] on HD - [ ] MWF or [ ] TTHS, [ ] Burning with urination, [ ] Frequent urination, [ ] Difficulty urinating;   Skin: No rashes  Psychological: No history of anxiety,  No history of depression   Physical Examination  Vitals:   10/26/16 1455 10/26/16 1500  BP: 137/75 138/80  Pulse: 78   Resp: 16   Temp: 98.4 F (36.9 C)     TempSrc: Oral   SpO2: 97%   Weight: 220 lb (99.8 kg)   Height: 5' 4" (1.626 m)     Body mass index is 37.76 kg/m.  General:  Alert and oriented, no acute distress HEENT: Normal Neck: No bruit or JVD Pulmonary: Clear to auscultation bilaterally Cardiac: Regular Rate and Rhythm without murmur Abdomen: Soft, non-tender, non-distended, no mass Skin: No rash Extremity Pulses:  2+ radial, brachial, femoral pulses bilaterally Musculoskeletal: No deformity or edema  Neurologic: Upper and lower extremity motor 5/5 and symmetric, no facial asymmetry  DATA:  I reviewed the patient's recent CT angiogram. There is some calcification at the carotid bifurcation with about a 70% stenosis. The lesion in its at about the C2-C3 interface level but certainly clamping above this would place the lesion and a fairly high distal extent.  ASSESSMENT:  Patient with possible symptomatic right internal carotid artery stenosis with TIA symptoms involving right brain with left arm weakness and clumsiness. Although she has some calcification at the carotid bifurcation I do not believe that this is circumferential and I believe she would be a reasonable candidate for TCAR stenting. Risks benefits possible complications and procedure details including but not limited to bleeding infection cranial nerve injury stroke were discussed with the patient today.  PLAN:  I will review these images with my partner Dr. Cain, tentatively we will schedule the patient for her procedure next Wednesday. If we decide she is not a stent candidate she  is currently on the schedule for Dr. Dickson to do a carotid endarterectomy on Tuesday. We will continue to obtain the cardiology risk stratification prior to this. If we proceed with stenting she will need to be on Plavix in addition to her Xarelto for at least 6 weeks.   Aldona Bryner, MD [facility] Office: [phone] Pager: [phone]   

## 2016-10-26 NOTE — Progress Notes (Signed)
   10/26/16 1125  OBSTRUCTIVE SLEEP APNEA  Have you ever been diagnosed with sleep apnea through a sleep study? No (WAITING ON STUDY RESULTS)  Do you snore loudly (loud enough to be heard through closed doors)?  0  Do you often feel tired, fatigued, or sleepy during the daytime (such as falling asleep during driving or talking to someone)? 1  Has anyone observed you stop breathing during your sleep? 0  Do you have, or are you being treated for high blood pressure? 1  BMI more than 35 kg/m2? 1  Age > 50 (1-yes) 1  Neck circumference greater than:Female 16 inches or larger, Female 17inches or larger? 1  Female Gender (Yes=1) 0  Obstructive Sleep Apnea Score 5  Score 5 or greater  Results sent to PCP

## 2016-10-26 NOTE — Pre-Procedure Instructions (Signed)
Jenn[name]  10/26/2016      Jennifer [name], [name] [address] Phone: [phone] Fax: [phone]    Your procedure is scheduled on   Tuesday  10/31/16  Report to [name] Admitting at 530 A.M.  Call this number if you have problems the morning of surgery:  [phone]   Remember:  Do not eat food or drink liquids after midnight.  Take these medicines the morning of surgery with A SIP OF WATER    AMLODIPINE (NORVASC), DIAZEPAM (VALIUM) IF NEEDED, DULOXETINE (CYMBALTA), GABAPENTIN (NEURONTIN), RANITIDINE (ZANTAC)   7 days prior to surgery STOP taking any Aspirin, Aleve, Naproxen, Ibuprofen, Motrin, Advil, Goody's, BC's, all herbal medications, fish oil, and all vitamins  STOP PLAVIX AND XARELTO AS INSTRUCTED BY PHYSICIAN.   Do not wear jewelry, make-up or nail polish.  Do not wear lotions, powders, or perfumes, or deoderant.  Do not shave 48 hours prior to surgery.  Men may shave face and neck.  Do not bring valuables to the hospital.  [name] is not responsible for any belongings or valuables.  Contacts, dentures or bridgework may not be worn into surgery.  Leave your suitcase in the car.  After surgery it may be brought to your room.  For patients admitted to the hospital, discharge time will be determined by your treatment team.  Patients discharged the day of surgery will not be allowed to drive home.   Name and phone number of your driver:    Special instructions:  [name] - Preparing for Surgery  Before surgery, you can play an important role.  Because skin is not sterile, your skin needs to be as free of germs as possible.  You can reduce the number of germs on you skin by washing with CHG (chlorahexidine gluconate) soap before surgery.  CHG is an antiseptic cleaner which kills germs and bonds with the skin to continue killing germs even after washing.  Please DO NOT use if you have  an allergy to CHG or antibacterial soaps.  If your skin becomes reddened/irritated stop using the CHG and inform your nurse when you arrive at Short Stay.  Do not shave (including legs and underarms) for at least 48 hours prior to the first CHG shower.  You may shave your face.  Please follow these instructions carefully:   1.  Shower with CHG Soap the night before surgery and the                                morning of Surgery.  2.  If you choose to wash your hair, wash your hair first as usual with your       normal shampoo.  3.  After you shampoo, rinse your hair and body thoroughly to remove the                      Shampoo.  4.  Use CHG as you would any other liquid soap.  You can apply chg directly       to the skin and wash gently with scrungie or a clean washcloth.  5.  Apply the CHG Soap to your body ONLY FROM THE NECK DOWN.        Do not use on open wounds or open sores.  Avoid contact with your eyes,  ears, mouth and genitals (private parts).  Wash genitals (private parts)       with your normal soap.  6.  Wash thoroughly, paying special attention to the area where your surgery        will be performed.  7.  Thoroughly rinse your body with warm water from the neck down.  8.  DO NOT shower/wash with your normal soap after using and rinsing off       the CHG Soap.  9.  Pat yourself dry with a clean towel.            10.  Wear clean pajamas.            11.  Place clean sheets on your bed the night of your first shower and do not        sleep with pets.  Day of Surgery  Do not apply any lotions/deoderants the morning of surgery.  Please wear clean clothes to the hospital/surgery center.    Please read over the following fact sheets that you were given. Pain Booklet, Coughing and Deep Breathing, MRSA Information and Surgical Site Infection Prevention

## 2016-10-26 NOTE — Progress Notes (Signed)
Patient stated her last dose of plavix was 10/25/16, and last dose of xarelto will be 10/28/16.  Patient stated she was to start taking 81mg  aspirin while off blood thinners.   Spoke with Zigmund Daniel at [facility] who stated Arbie Cookey has been in  Contact with Kerin Ransom for cardiac clearance.  Once clearance is filled out Arbie Cookey will scan in system.

## 2016-10-27 ENCOUNTER — Other Ambulatory Visit: Payer: Self-pay

## 2016-10-27 ENCOUNTER — Encounter: Payer: Self-pay | Admitting: Cardiovascular Disease

## 2016-10-27 ENCOUNTER — Telehealth: Payer: Self-pay

## 2016-10-27 ENCOUNTER — Ambulatory Visit (INDEPENDENT_AMBULATORY_CARE_PROVIDER_SITE_OTHER): Payer: Medicare Other | Admitting: Cardiovascular Disease

## 2016-10-27 VITALS — BP 140/74 | HR 74 | Ht 64.0 in | Wt 219.8 lb

## 2016-10-27 DIAGNOSIS — Z01818 Encounter for other preprocedural examination: Secondary | ICD-10-CM

## 2016-10-27 DIAGNOSIS — I10 Essential (primary) hypertension: Secondary | ICD-10-CM

## 2016-10-27 DIAGNOSIS — Z720 Tobacco use: Secondary | ICD-10-CM

## 2016-10-27 DIAGNOSIS — G459 Transient cerebral ischemic attack, unspecified: Secondary | ICD-10-CM

## 2016-10-27 DIAGNOSIS — I82592 Chronic embolism and thrombosis of other specified deep vein of left lower extremity: Secondary | ICD-10-CM

## 2016-10-27 DIAGNOSIS — I25118 Atherosclerotic heart disease of native coronary artery with other forms of angina pectoris: Secondary | ICD-10-CM

## 2016-10-27 DIAGNOSIS — I739 Peripheral vascular disease, unspecified: Secondary | ICD-10-CM

## 2016-10-27 DIAGNOSIS — I779 Disorder of arteries and arterioles, unspecified: Secondary | ICD-10-CM | POA: Diagnosis not present

## 2016-10-27 NOTE — Progress Notes (Signed)
Anesthesia Chart Review:  Pt is a 66 year old female scheduled for R trans-carotid artery revascularization and 11/01/2016 with Ruta Hinds, M.D.  - PCP is Redmond School, MD - Cardiologist is Kate Sable, MD who cleared pt for surgery at last office visit 10/27/16.   PMH includes: CAD (mild-moderate 12/2014), HTN, hyperlipidemia, PE, DVT, single kidney (congenital), GERD. Current smoker. BMI 38.  Medications include: Amlodipine, ASA 81 mg, Plavix, lisinopril-HCTZ, Zantac, Xarelto. Pt to hold xarelto 48 hours belfore surgery.  Continue plavix perioperatively.   Preoperative labs reviewed.  PT 16.5, PTT 26.  I will defer to Dr. Oneida Alar whether or not to recheck coags DOS.   EKG 08/10/16: Sinus  Rhythm. Old anterior infarct. Low voltage -possible pulmonary disease.  CTA neck 09/19/16:  1. Critical stenosis RIGHT internal carotid artery origin. 2. 60% stenosis LEFT internal carotid artery origin. 3. Severe stenosis LEFT vertebral artery origin. 4. Multilevel severe neural foraminal narrowing.  Carotid duplex 09/19/16:  - Right: Heterogeneous and partially calcified plaque at the carotid bifurcation, with discordant results regarding degree of stenosis by established duplex criteria. Peak velocity suggests 70% - 99% stenosis, with the ICA/ CCA ratio suggesting a lesser degree of stenosis.  - Left: Heterogeneous plaque at the carotid bifurcation, with discordant results regarding degree of stenosis by established duplex criteria. Peak velocity suggests 50%- 69% stenosis, with the ICA/ CCA ratio suggesting a lesser degree of stenosis.  Echo 08/14/16:  - Left ventricle: The cavity size was normal. Wall thickness was increased in a pattern of mild LVH. Systolic function was vigorous. The estimated ejection fraction was in the range of 65% to 70%. Wall motion was normal; there were no regional wall motion abnormalities. Doppler parameters are consistent with abnormal left ventricular relaxation  (grade 1 diastolic dysfunction). - Aortic valve: Mildly calcified annulus. Trileaflet; mildly to moderately calcified leaflets. Mean gradient (S): 8 mm Hg. Peak gradient (S): 15 mm Hg. Valve area (VTI): 2.05 cm^2. - Mitral valve: Loose mitral chordal structure noted, appears to be attached to the anterior mitral leaflet. No evidence of flail leaflet. Calcified annulus. - Right atrium: Central venous pressure (est): 3 mm Hg. - Atrial septum: No defect or patent foramen ovale was identified. - Tricuspid valve: There was trivial regurgitation. - Pulmonary arteries: PA peak pressure: 17 mm Hg (S). - Pericardium, extracardiac: There was no pericardial effusion.  Cardiac cath 01/08/15:   Mid RCA lesion, 60% stenosed.  Mid Cx to Dist Cx lesion, 50% stenosed.  Dist LAD lesion, 30% stenosed.  2nd Diag lesion, 25% stenosed.  The left ventricular systolic function is normal. Assessment: 1. Mild to moderate non-obstructive CAD with 60% mid RCA 2. Normal LV function  If no changes, I anticipate pt can proceed with surgery as scheduled.   Willeen Cass, FNP-BC [facility]/Anesthesiology Phone: [phone] 10/27/2016 4:45 PM

## 2016-10-27 NOTE — Progress Notes (Signed)
SUBJECTIVE: The patient presents for preoperative risk stratification evaluation. I have only evaluated her once before on 01/07/15.  She saw vascular surgery yesterday and they are planning stenting versus carotid endarterectomy for symptomatic right internal carotid artery stenosis with TIA symptoms involving the right brain with left arm weakness and clumsiness.  She saw Kerin Ransom PA-C on 08/10/16. She has a history of moderate right and left circumflex coronary artery disease by angiography (mid RCA 60% stenosis, mid to distal Cx 50%, distal LAD 30%) in October 2016. She has a remote history of PE and DVT and is on chronic anticoagulation.  Echocardiogram 08/14/16: Vigorous left ventricular systolic function with mild LVH and grade 1 diastolic dysfunction, LVEF 65-70%.  Telemetry tracing showed sinus rhythm with an average heart rate is 67 bpm.  Carotid Dopplers 09/19/16: 50-69% left internal carotid artery stenosis and 70-99% right internal carotid artery stenosis.  She was having syncopal episodes which led to her workup.  She denies exertional chest pain. She feels fatigued. Her primary complaint relates to bilateral feet neuropathy. She does not get relief with gabapentin.  She continues to smoke one pack of cigarettes daily and has done so since the age of 11.      Review of Systems: As per "subjective", otherwise negative.  Allergies  Allergen Reactions  . Bee Venom Anaphylaxis    Current Outpatient Prescriptions  Medication Sig Dispense Refill  . acetaminophen (TYLENOL 8 HOUR) 650 MG CR tablet Take 650 mg by mouth every 8 (eight) hours as needed for pain.    Marland Kitchen amLODipine (NORVASC) 5 MG tablet Take 5 mg by mouth daily.    Marland Kitchen aspirin 81 MG chewable tablet Chew 81 mg by mouth daily.    . Cholecalciferol (VITAMIN D) 2000 units CAPS Take 2,000 Units by mouth daily.    . clopidogrel (PLAVIX) 75 MG tablet Take 1 tablet (75 mg total) by mouth daily. 30 tablet 11  .  diazepam (VALIUM) 2 MG tablet Take 2 mg by mouth daily as needed for anxiety.     . DULoxetine (CYMBALTA) 30 MG capsule Take 30 mg by mouth daily.    Marland Kitchen gabapentin (NEURONTIN) 600 MG tablet Take 300-600 mg by mouth See admin instructions. Take 300 mg twice daily IN THE MORNING AND AT NOON AND  600 mg at bedtime.    Marland Kitchen lisinopril-hydrochlorothiazide (PRINZIDE,ZESTORETIC) 20-25 MG per tablet Take 1 tablet by mouth daily.      . Menthol-Methyl Salicylate (MUSCLE RUB) 10-15 % CREA Apply 1 application topically 2 (two) times daily as needed for muscle pain.    . ranitidine (ZANTAC) 150 MG tablet Take 150 mg by mouth daily as needed for heartburn.    . rivaroxaban (XARELTO) 20 MG TABS tablet Take 20 mg by mouth daily with supper.    . Vitamin D, Ergocalciferol, (DRISDOL) 50000 units CAPS capsule Take one capsule weekly x 8 weeks then convert to OTC Vitamin D 2000 units daily thereafter. 8 capsule 0   No current facility-administered medications for this visit.     Past Medical History:  Diagnosis Date  . Anxiety   . Arthritis   . CAD (coronary artery disease)    a. Cath 12/2014 - mild-moderate non-obstructive CAD with 60% mid RCA, 50% mid Cx-distal Cx, 30% distal LAD, 25% D2, EF 55-65%.  . Depression   . Dizziness   . DVT (deep venous thrombosis) ([place])   . Dyspnea    W/ EXERTION  . GERD (gastroesophageal  reflux disease)   . Headache   . Heart murmur    DX   AT BIRTH  . Hyperlipidemia   . Hypertension   . 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 Neuropathy   . PE (pulmonary embolism)   . Tobacco abuse     Past Surgical History:  Procedure Laterality Date  . ABDOMINAL HYSTERECTOMY    . CARDIAC CATHETERIZATION N/A 01/08/2015   Procedure: Left Heart Cath and Coronary Angiography;  Surgeon: Jolaine Artist, MD;  Location: [facility];  Service: Cardiovascular;  Laterality: N/A;  . CARPAL TUNNEL RELEASE     RIGHT  . KNEE ARTHROSCOPY W/ MENISCAL REPAIR     LEFT KNEE    FORMED DVT (SURG TO REMOVE BLOOD CLOT)  . TONSILLECTOMY      Social History   Social History  . Marital status: Divorced    Spouse name: N/A  . Number of children: 2  . Years of education: 12   Occupational History  . Retired    Social History Main Topics  . Smoking status: Current Every Day Smoker    Packs/day: 1.00    Types: Cigarettes    Start date: 06/18/1967  . Smokeless tobacco: Never Used     Comment: Down to 1/2 pk per day.   . Alcohol use No  . Drug use: No  . Sexual activity: Not on file   Other Topics Concern  . Not on file   Social History Narrative   Lives at home alone.   Right-handed.   2 cups caffeine per day.     Vitals:   10/27/16 1348  BP: 140/74  Pulse: 74  SpO2: 98%  Weight: 219 lb 12.8 oz (99.7 kg)  Height: 5\' 4"  (1.626 m)    Wt Readings from Last 3 Encounters:  10/27/16 219 lb 12.8 oz (99.7 kg)  10/26/16 218 lb 14.4 oz (99.3 kg)  10/26/16 220 lb (99.8 kg)     PHYSICAL EXAM General: NAD HEENT: Normal. Neck: No JVD, no thyromegaly. Lungs: Diminished throughout, no crackles or wheezes. CV: Nondisplaced PMI.  Regular rate and rhythm, normal S1/S2, no S3/S4, no murmur. No pretibial or periankle edema.  No carotid bruit.   Abdomen: Soft, nontender, no distention.  Neurologic: Alert and oriented.  Psych: Normal affect. Skin: Normal. Musculoskeletal: No gross deformities.    ECG: Most recent ECG reviewed.   Labs: Lab Results  Component Value Date/Time   K 3.4 (L) 10/26/2016 11:39 AM   BUN 9 10/26/2016 11:39 AM   CREATININE 1.15 (H) 10/26/2016 11:39 AM   ALT 13 (L) 10/26/2016 11:39 AM   TSH 1.480 06/20/2016 02:43 PM   HGB 14.7 10/26/2016 11:39 AM     Lipids: Lab Results  Component Value Date/Time   LDLCALC 120 (H) 01/07/2015 03:11 AM   CHOL 196 01/07/2015 03:11 AM   TRIG 246 (H) 01/07/2015 03:11 AM   HDL 27 (L) 01/07/2015 03:11 AM       ASSESSMENT AND PLAN: 1. Preoperative risk stratification: She has multiple  comorbidities which include nonobstructive coronary artery disease, hypertension, obesity, remote history of DVT, and long-standing tobacco abuse with probable COPD. She is likely at a moderate risk for major adverse cardiac events in the perioperative period. This should not preclude necessary surgery. She does not require stress testing at this time.  2. Coronary artery disease: Presently on aspirin. Symptomatically stable. Would recommend statin therapy.  3. Hypertension: Mildly elevated. Will monitor.  4. 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 Only superficial venous thrombosis seen in March 2016. Venous Dopplers in July 2016 demonstrated persistent nonocclusive and partially recannulized DVT on the left involving the femoral and popliteal veins. CT angiography of the chest in October 2016 did not demonstrate any evidence of pulmonary embolus. She remains (and prefers) lifelong anticoagulation.  5. Tobacco abuse disorder.    Disposition: Follow up 4 months.   Time spent: 40 minutes, of which greater than 50% was spent reviewing symptoms, relevant blood tests and studies, and discussing management plan with the patient.    Kate Sable, M.D., F.A.C.C.

## 2016-10-27 NOTE — Telephone Encounter (Signed)
Phone call to pt.  Advised of plan for changing surgery from CEA on 8/21 to TCAR on 8/22.   Advised to restart Plavix today.  Reiterated that the pt. should hold Xarelto 48 hrs. prior to procedure; last dose to be taken on 8/19, then hold on 8/20, 8/21, and AM of 8/22.  Questioned about ASA that she was told to take in place of Plavix..  Discussed with Dr. Donzetta Matters re: need to stop or to continue ASA.  Advised the pt. can stop the ASA, since she has been on both Xarelto and Plavix.   The pt. Was notified of recommendation to stop ASA.  Questioned if she is on any medication for cholesterol; denied.  Advised will discuss with Dr. Oneida Alar on 8/20, and notify her with any recommendation.  Verb. Understanding.

## 2016-10-27 NOTE — Patient Instructions (Addendum)
Medication Instructions:   Your physician recommends that you continue on your current medications as directed. Please refer to the Current Medication list given to you today.  Labwork:  NONE  Testing/Procedures:  NONE  Follow-Up:  Your physician recommends that you schedule a follow-up appointment in: 4 months.  Any Other Special Instructions Will Be Listed Below (If Applicable).  Per Dr. Bronson Ing, from a cardiac standpoint you are cleared to have your surgery. A copy of this office note will be sent to your surgeon.  If you need a refill on your cardiac medications before your next appointment, please call your pharmacy.

## 2016-10-29 ENCOUNTER — Emergency Department (HOSPITAL_COMMUNITY)
Admission: EM | Admit: 2016-10-29 | Discharge: 2016-10-29 | Disposition: A | Discharge status: Home / Self Care | Payer: Medicare Other | Source: Home / Self Care | Attending: Emergency Medicine | Admitting: Emergency Medicine

## 2016-10-29 ENCOUNTER — Emergency Department (HOSPITAL_COMMUNITY): Payer: Medicare Other

## 2016-10-29 DIAGNOSIS — Z7952 Long term (current) use of systemic steroids: Secondary | ICD-10-CM | POA: Diagnosis not present

## 2016-10-29 DIAGNOSIS — Z7901 Long term (current) use of anticoagulants: Secondary | ICD-10-CM

## 2016-10-29 DIAGNOSIS — X501XXA Overexertion from prolonged static or awkward postures, initial encounter: Secondary | ICD-10-CM

## 2016-10-29 DIAGNOSIS — Z7902 Long term (current) use of antithrombotics/antiplatelets: Secondary | ICD-10-CM | POA: Diagnosis not present

## 2016-10-29 DIAGNOSIS — I2510 Atherosclerotic heart disease of native coronary artery without angina pectoris: Secondary | ICD-10-CM | POA: Insufficient documentation

## 2016-10-29 DIAGNOSIS — M1990 Unspecified osteoarthritis, unspecified site: Secondary | ICD-10-CM | POA: Diagnosis not present

## 2016-10-29 DIAGNOSIS — I10 Essential (primary) hypertension: Secondary | ICD-10-CM

## 2016-10-29 DIAGNOSIS — M109 Gout, unspecified: Secondary | ICD-10-CM | POA: Diagnosis not present

## 2016-10-29 DIAGNOSIS — I739 Peripheral vascular disease, unspecified: Secondary | ICD-10-CM | POA: Diagnosis not present

## 2016-10-29 DIAGNOSIS — K219 Gastro-esophageal reflux disease without esophagitis: Secondary | ICD-10-CM | POA: Diagnosis not present

## 2016-10-29 DIAGNOSIS — Z91030 Bee allergy status: Secondary | ICD-10-CM | POA: Diagnosis not present

## 2016-10-29 DIAGNOSIS — F17210 Nicotine dependence, cigarettes, uncomplicated: Secondary | ICD-10-CM | POA: Insufficient documentation

## 2016-10-29 DIAGNOSIS — Z86711 Personal history of pulmonary embolism: Secondary | ICD-10-CM | POA: Diagnosis not present

## 2016-10-29 DIAGNOSIS — R011 Cardiac murmur, unspecified: Secondary | ICD-10-CM | POA: Diagnosis not present

## 2016-10-29 DIAGNOSIS — M79671 Pain in right foot: Secondary | ICD-10-CM | POA: Diagnosis not present

## 2016-10-29 DIAGNOSIS — Z79899 Other long term (current) drug therapy: Secondary | ICD-10-CM | POA: Insufficient documentation

## 2016-10-29 DIAGNOSIS — Z820 Family history of epilepsy and other diseases of the nervous system: Secondary | ICD-10-CM | POA: Diagnosis not present

## 2016-10-29 DIAGNOSIS — Z8249 Family history of ischemic heart disease and other diseases of the circulatory system: Secondary | ICD-10-CM | POA: Diagnosis not present

## 2016-10-29 DIAGNOSIS — G629 Polyneuropathy, unspecified: Secondary | ICD-10-CM | POA: Diagnosis not present

## 2016-10-29 DIAGNOSIS — E785 Hyperlipidemia, unspecified: Secondary | ICD-10-CM | POA: Diagnosis not present

## 2016-10-29 DIAGNOSIS — Z7982 Long term (current) use of aspirin: Secondary | ICD-10-CM | POA: Diagnosis not present

## 2016-10-29 DIAGNOSIS — I6521 Occlusion and stenosis of right carotid artery: Secondary | ICD-10-CM | POA: Diagnosis not present

## 2016-10-29 DIAGNOSIS — Z86718 Personal history of other venous thrombosis and embolism: Secondary | ICD-10-CM | POA: Diagnosis not present

## 2016-10-29 DIAGNOSIS — M7731 Calcaneal spur, right foot: Secondary | ICD-10-CM | POA: Diagnosis not present

## 2016-10-29 MED ORDER — ACETAMINOPHEN 500 MG PO TABS
1000.0000 mg | ORAL_TABLET | Freq: Once | ORAL | Status: AC
  Administered 2016-10-29: 1000 mg via ORAL
  Filled 2016-10-29: qty 2

## 2016-10-29 MED ORDER — PREDNISONE 20 MG PO TABS
40.0000 mg | ORAL_TABLET | Freq: Once | ORAL | Status: AC
  Administered 2016-10-29: 40 mg via ORAL
  Filled 2016-10-29: qty 2

## 2016-10-29 MED ORDER — PREDNISONE 10 MG PO TABS: 10.0000 mg | ORAL_TABLET | Freq: Every day | ORAL | 0 refills | Status: DC

## 2016-10-29 MED ORDER — OXYCODONE-ACETAMINOPHEN 5-325 MG PO TABS: 1.0000 | ORAL_TABLET | ORAL | 0 refills | Status: DC | PRN

## 2016-10-29 NOTE — ED Triage Notes (Signed)
Pt comes in with right foot pain starting yesterday. Denies any injury. States she has hx of gout and this feels the same. Top of right foot is swollen and red.

## 2016-10-29 NOTE — ED Notes (Signed)
Awaiting disposition.

## 2016-10-29 NOTE — ED Provider Notes (Signed)
[facility] DEPT Provider Note   CSN: [id number] Arrival date & time: 10/29/16  1829     History   Chief Complaint Chief Complaint  Patient presents with  . Foot Pain    HPI Jennifer [name] is a 66 y.o. female.  Patient complains of pain in the right foot on the dorsal proximal lateral aspect. She did allegedly twisted her foot yesterday accidentally. She also has a history of gout. No pain in the big toe. No fever, sweats, chills. Severity of pain is moderate. Palpation makes pain worse.      Past Medical History:  Diagnosis Date  . Anxiety   . Arthritis   . CAD (coronary artery disease)    a. Cath 12/2014 - mild-moderate non-obstructive CAD with 60% mid RCA, 50% mid Cx-distal Cx, 30% distal LAD, 25% D2, EF 55-65%.  . Depression   . Dizziness   . DVT (deep venous thrombosis) ([place])   . Dyspnea    W/ EXERTION  . GERD (gastroesophageal reflux disease)   . Headache   . Heart murmur    DX   AT BIRTH  . Hyperlipidemia   . Hypertension   . 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 Neuropathy   . PE (pulmonary embolism)   . Tobacco abuse     Patient Active Problem List   Diagnosis Date Noted  . Syncope and collapse 08/10/2016  . Morbid obesity ([place]) 08/10/2016  . Low back pain 07/28/2016  . Gait abnormality 07/28/2016  . Carotid artery stenosis 07/28/2016  . Paresthesia 07/28/2016  . Dizziness 06/20/2016  . Numbness 06/20/2016  . CAD in native artery 01/08/2015  . Chest pain 01/06/2015  . Essential hypertension 01/06/2015  . Hyperlipidemia 01/06/2015  . History of pulmonary embolus (PE) 01/06/2015  . Leukocytosis 01/06/2015  . Tobacco use disorder 01/06/2015  . History of DVT (deep vein thrombosis) 01/06/2015    Past Surgical History:  Procedure Laterality Date  . ABDOMINAL HYSTERECTOMY    . CARDIAC CATHETERIZATION N/A 01/08/2015   Procedure: Left Heart Cath and Coronary Angiography;  Surgeon: Jolaine Artist, MD;  Location: [facility];  Service: Cardiovascular;  Laterality: N/A;  . CARPAL TUNNEL RELEASE     RIGHT  . KNEE ARTHROSCOPY W/ MENISCAL REPAIR     LEFT KNEE   FORMED DVT (SURG TO REMOVE BLOOD CLOT)  . TONSILLECTOMY      OB History    Gravida Para Term Preterm AB Living             2   SAB TAB Ectopic Multiple Live Births                   Home Medications    Prior to Admission medications   Medication Sig Start Date End Date Taking? Authorizing Provider  amLODipine (NORVASC) 5 MG tablet Take 5 mg by mouth daily.   Yes [provider]  Cholecalciferol (VITAMIN D) 2000 units CAPS Take 2,000 Units by mouth daily.   Yes [provider]  clopidogrel (PLAVIX) 75 MG tablet Take 1 tablet (75 mg total) by mouth daily. 06/20/16  Yes Marcial Pacas, MD  diazepam (VALIUM) 2 MG tablet Take 2 mg by mouth daily as needed for anxiety.    Yes [provider]  DULoxetine (CYMBALTA) 30 MG capsule Take 30 mg by mouth daily.   Yes [provider]  gabapentin (NEURONTIN) 600 MG tablet Take 300-600 mg by mouth See admin  instructions. Take 300 mg twice daily IN THE MORNING AND AT NOON AND  600 mg at bedtime.   Yes [provider]  lisinopril-hydrochlorothiazide (PRINZIDE,ZESTORETIC) 20-25 MG per tablet Take 1 tablet by mouth daily.     Yes [provider]  Menthol-Methyl Salicylate (MUSCLE RUB) 10-15 % CREA Apply 1 application topically 2 (two) times daily as needed for muscle pain.   Yes [provider]  Oxycodone HCl 10 MG TABS Take 1 tablet by mouth 2 (two) times daily as needed (back pain).  10/27/16  Yes [provider]  ranitidine (ZANTAC) 150 MG tablet Take 150 mg by mouth daily as needed for heartburn.   Yes [provider]  rivaroxaban (XARELTO) 20 MG TABS tablet Take 20 mg by mouth daily with supper.   Yes [provider]  oxyCODONE-acetaminophen (PERCOCET/ROXICET) 5-325 MG tablet Take 1-2 tablets by mouth every 4 (four) hours as  needed for severe pain. 10/29/16   Nat Christen, MD  predniSONE (DELTASONE) 10 MG tablet Take 1 tablet (10 mg total) by mouth daily with breakfast. 3 tablets daily for 3 days, 2 tablets daily for 3 days, one tablet daily for 3 days. 10/29/16   Nat Christen, MD  Vitamin D, Ergocalciferol, (DRISDOL) 50000 units CAPS capsule Take one capsule weekly x 8 weeks then convert to OTC Vitamin D 2000 units daily thereafter. Patient not taking: Reported on 10/29/2016 06/21/16   Marcial Pacas, MD    Family History Family History  Problem Relation Age of Onset  . Heart attack Mother   . Heart attack Father   . Heart attack Brother        CABG  . Heart attack Brother        CABG  . Heart attack Brother        CABG  . Ovarian cancer Maternal Grandmother   . Heart disease Maternal Grandfather   . Dementia Paternal Grandmother     Social History Social History  Substance Use Topics  . Smoking status: Current Every Day Smoker    Packs/day: 1.00    Types: Cigarettes    Start date: 06/18/1967  . Smokeless tobacco: Never Used     Comment: Down to 1/2 pk per day.   . Alcohol use No     Allergies   Bee venom   Review of Systems Review of Systems  All other systems reviewed and are negative.    Physical Exam Updated Vital Signs BP (!) 142/59   Pulse 70   Temp 98.6 F (37 C) (Oral)   Resp (!) 22   Ht 5\' 4"  (1.626 m)   Wt 99.8 kg (220 lb)   SpO2 100%   BMI 37.76 kg/m   Physical Exam  Constitutional: She is oriented to person, place, and time. She appears well-developed and well-nourished.  HENT:  Head: Normocephalic and atraumatic.  Eyes: Conjunctivae are normal.  Neck: Neck supple.  Musculoskeletal:  Right lower extremity: Ankle nontender. She is tender in the proximal dorsal lateral aspect of the foot. No evidence of cellulitis.  Neurological: She is alert and oriented to person, place, and time.  Skin: Skin is warm and dry.  Psychiatric: She has a normal mood and affect. Her behavior  is normal.  Nursing note and vitals reviewed.    ED Treatments / Results  Labs (all labs ordered are listed, but only abnormal results are displayed) Labs Reviewed - No data to display  EKG  EKG Interpretation None  Radiology Dg Foot Complete Right  Result Date: 10/29/2016 CLINICAL DATA:  Pt states pain in the anterior/medial tibiotalar region. Hx of gout and stumbles which pt states may be attributed to the pain. EXAM: RIGHT FOOT COMPLETE - 3+ VIEW COMPARISON:  None. FINDINGS: No fracture or dislocation. There is subchondral cystic change at the first metatarsal head. No other arthropathic change. Small plantar calcaneal spur. Bones are demineralized.  Soft tissues are unremarkable. IMPRESSION: 1. No fracture, dislocation or acute finding. 2. Subchondral cystic change of the first metatarsal head at the first MCP joint, most likely from osteoarthritis. 3. Small plantar calcaneal spur. Electronically Signed   By: Lajean Manes M.D.   On: 10/29/2016 10:32    Procedures Procedures (including critical care time)  Medications Ordered in ED Medications  acetaminophen (TYLENOL) tablet 1,000 mg (1,000 mg Oral Given 10/29/16 1210)  predniSONE (DELTASONE) tablet 40 mg (40 mg Oral Given 10/29/16 1210)     Initial Impression / Assessment and Plan / ED Course  I have reviewed the triage vital signs and the nursing notes.  Pertinent labs & imaging results that were available during my care of the patient were reviewed by me and considered in my medical decision making (see chart for details).     Plain films show no evidence of a fracture. This could be gout, but it is an unlikely location. Will Rx prednisone and Percocet.  Final Clinical Impressions(s) / ED Diagnoses   Final diagnoses:  Right foot pain    New Prescriptions New Prescriptions   OXYCODONE-ACETAMINOPHEN (PERCOCET/ROXICET) 5-325 MG TABLET    Take 1-2 tablets by mouth every 4 (four) hours as needed for severe pain.    PREDNISONE (DELTASONE) 10 MG TABLET    Take 1 tablet (10 mg total) by mouth daily with breakfast. 3 tablets daily for 3 days, 2 tablets daily for 3 days, one tablet daily for 3 days.     Nat Christen, MD 10/29/16 [id number]

## 2016-10-29 NOTE — Discharge Instructions (Signed)
X-ray showed no acute fracture. Prescription for prednisone and pain medicine. Follow-up your primary care doctor

## 2016-10-30 ENCOUNTER — Ambulatory Visit: Payer: Medicare Other | Admitting: Neurology

## 2016-10-30 ENCOUNTER — Telehealth: Payer: Self-pay

## 2016-10-30 ENCOUNTER — Other Ambulatory Visit: Payer: Self-pay

## 2016-10-30 ENCOUNTER — Telehealth: Payer: Self-pay | Admitting: Vascular Surgery

## 2016-10-30 DIAGNOSIS — Z8673 Personal history of transient ischemic attack (TIA), and cerebral infarction without residual deficits: Secondary | ICD-10-CM

## 2016-10-30 DIAGNOSIS — I6521 Occlusion and stenosis of right carotid artery: Secondary | ICD-10-CM

## 2016-10-30 MED ORDER — ATORVASTATIN CALCIUM 10 MG PO TABS: 10.0000 mg | ORAL_TABLET | Freq: Every day | ORAL | 6 refills | Status: DC

## 2016-10-30 NOTE — Telephone Encounter (Signed)
Pt. returned phone call.  Acknowledged that she is to start Atorvastatin 10 mg daily.  Stated she will pick up Rx today.

## 2016-10-30 NOTE — Telephone Encounter (Signed)
-----   Message from Elam Dutch, MD sent at 10/30/2016  7:25 AM EDT ----- Regarding: RE: cholesterol medication Yes please start her on lowest dose of cheapest or easiest to prescribe statin  Juanda Crumble  ----- Message ----- From: Denman George, RN Sent: 10/27/2016   5:46 PM To: Elam Dutch, MD Subject: cholesterol medication                         Should she be on something for cholesterol, prior to a TCAR?  She said she doesn't take anything for her cholesterol.

## 2016-10-30 NOTE — Telephone Encounter (Signed)
Phone call placed to [facility]; spoke with Eulas Post, [place].  Inquired about the lowest dose statin, that is inexpensive.  The pharmacist advised Atorvastatin 10 mg.  Gave verbal order for Atorvastatin 10 mg, po, qd; #30; RF x 6.    Phone call to pt.  Left voice message re: ordering a medication for Cholesterol, and advised to start today, and to take it the day of the procedure.  Encouraged to call back with any questions.  Will f/u with phone call later today, to be sure that the pt. rec'd the voice message.

## 2016-10-30 NOTE — Telephone Encounter (Signed)
-----   Message from Merlene Laughter, LPN sent at 11/23/6857 11:56 AM EDT ----- Regarding: surgical clearance Good morning. This patient is scheduled to see Dr. Bronson Ing today at 2:00 pm. We were not able to reach her on the phone but we did reach her emergency contact person Mrs. Evalee Jefferson and she said that she would make sure that she got the message.

## 2016-10-30 NOTE — Addendum Note (Signed)
Addended by: Denman George on: 10/30/2016 01:54 PM   Modules accepted: Orders

## 2016-10-31 MED ORDER — DEXTROSE 5 % IV SOLN
1.5000 g | INTRAVENOUS | Status: AC
  Administered 2016-11-01: 1.5 g via INTRAVENOUS
  Filled 2016-10-31: qty 1.5

## 2016-11-01 ENCOUNTER — Inpatient Hospital Stay (HOSPITAL_COMMUNITY): Payer: Medicare Other | Admitting: Anesthesiology

## 2016-11-01 ENCOUNTER — Encounter (HOSPITAL_COMMUNITY)
Admission: RE | Disposition: A | Discharge status: Home / Self Care | Payer: Self-pay | Source: Ambulatory Visit | Attending: Vascular Surgery

## 2016-11-01 ENCOUNTER — Inpatient Hospital Stay (HOSPITAL_COMMUNITY)
Admission: RE | Admit: 2016-11-01 | Discharge: 2016-11-02 | DRG: 039 | Disposition: A | Discharge status: Home / Self Care | Payer: Medicare Other | Source: Ambulatory Visit | Attending: Vascular Surgery | Admitting: Vascular Surgery

## 2016-11-01 ENCOUNTER — Encounter (HOSPITAL_COMMUNITY): Payer: Self-pay | Admitting: *Deleted

## 2016-11-01 ENCOUNTER — Inpatient Hospital Stay (HOSPITAL_COMMUNITY): Payer: Medicare Other | Admitting: Emergency Medicine

## 2016-11-01 DIAGNOSIS — Z90710 Acquired absence of both cervix and uterus: Secondary | ICD-10-CM

## 2016-11-01 DIAGNOSIS — F419 Anxiety disorder, unspecified: Secondary | ICD-10-CM | POA: Diagnosis present

## 2016-11-01 DIAGNOSIS — F17210 Nicotine dependence, cigarettes, uncomplicated: Secondary | ICD-10-CM | POA: Diagnosis present

## 2016-11-01 DIAGNOSIS — Z8249 Family history of ischemic heart disease and other diseases of the circulatory system: Secondary | ICD-10-CM | POA: Diagnosis not present

## 2016-11-01 DIAGNOSIS — Z91030 Bee allergy status: Secondary | ICD-10-CM | POA: Diagnosis not present

## 2016-11-01 DIAGNOSIS — Z820 Family history of epilepsy and other diseases of the nervous system: Secondary | ICD-10-CM | POA: Diagnosis not present

## 2016-11-01 DIAGNOSIS — Z7902 Long term (current) use of antithrombotics/antiplatelets: Secondary | ICD-10-CM

## 2016-11-01 DIAGNOSIS — I2510 Atherosclerotic heart disease of native coronary artery without angina pectoris: Secondary | ICD-10-CM | POA: Diagnosis not present

## 2016-11-01 DIAGNOSIS — Z8041 Family history of malignant neoplasm of ovary: Secondary | ICD-10-CM

## 2016-11-01 DIAGNOSIS — I6521 Occlusion and stenosis of right carotid artery: Secondary | ICD-10-CM | POA: Diagnosis present

## 2016-11-01 DIAGNOSIS — Z86711 Personal history of pulmonary embolism: Secondary | ICD-10-CM

## 2016-11-01 DIAGNOSIS — Z7982 Long term (current) use of aspirin: Secondary | ICD-10-CM | POA: Diagnosis not present

## 2016-11-01 DIAGNOSIS — R011 Cardiac murmur, unspecified: Secondary | ICD-10-CM | POA: Diagnosis present

## 2016-11-01 DIAGNOSIS — Z7952 Long term (current) use of systemic steroids: Secondary | ICD-10-CM | POA: Diagnosis not present

## 2016-11-01 DIAGNOSIS — Z86718 Personal history of other venous thrombosis and embolism: Secondary | ICD-10-CM | POA: Diagnosis not present

## 2016-11-01 DIAGNOSIS — M1990 Unspecified osteoarthritis, unspecified site: Secondary | ICD-10-CM | POA: Diagnosis present

## 2016-11-01 DIAGNOSIS — K219 Gastro-esophageal reflux disease without esophagitis: Secondary | ICD-10-CM | POA: Diagnosis present

## 2016-11-01 DIAGNOSIS — G629 Polyneuropathy, unspecified: Secondary | ICD-10-CM | POA: Diagnosis present

## 2016-11-01 DIAGNOSIS — E785 Hyperlipidemia, unspecified: Secondary | ICD-10-CM | POA: Diagnosis not present

## 2016-11-01 DIAGNOSIS — I10 Essential (primary) hypertension: Secondary | ICD-10-CM | POA: Diagnosis present

## 2016-11-01 DIAGNOSIS — M109 Gout, unspecified: Secondary | ICD-10-CM | POA: Diagnosis present

## 2016-11-01 DIAGNOSIS — I739 Peripheral vascular disease, unspecified: Secondary | ICD-10-CM | POA: Diagnosis present

## 2016-11-01 DIAGNOSIS — F329 Major depressive disorder, single episode, unspecified: Secondary | ICD-10-CM | POA: Diagnosis present

## 2016-11-01 DIAGNOSIS — Z7901 Long term (current) use of anticoagulants: Secondary | ICD-10-CM

## 2016-11-01 DIAGNOSIS — N289 Disorder of kidney and ureter, unspecified: Secondary | ICD-10-CM | POA: Diagnosis present

## 2016-11-01 LAB — GLUCOSE, CAPILLARY: Glucose-Capillary: 84 mg/dL (ref 65–99)

## 2016-11-01 SURGERY — TRANSCAROTID ARTERY REVASCULARIZATION (TCAR)
Anesthesia: General | Site: Neck | Laterality: Right

## 2016-11-01 MED ORDER — ACETAMINOPHEN 325 MG PO TABS
325.0000 mg | ORAL_TABLET | ORAL | Status: DC | PRN
  Filled 2016-11-01: qty 2

## 2016-11-01 MED ORDER — DEXTROSE-NACL 5-0.45 % IV SOLN: INTRAVENOUS | Status: DC

## 2016-11-01 MED ORDER — CHLORHEXIDINE GLUCONATE 4 % EX LIQD: 60.0000 mL | Freq: Once | CUTANEOUS | Status: DC

## 2016-11-01 MED ORDER — ASPIRIN EC 325 MG PO TBEC
DELAYED_RELEASE_TABLET | ORAL | Status: AC
  Filled 2016-11-01: qty 1

## 2016-11-01 MED ORDER — MORPHINE SULFATE (PF) 2 MG/ML IV SOLN: 2.0000 mg | INTRAVENOUS | Status: DC | PRN

## 2016-11-01 MED ORDER — FAMOTIDINE 20 MG PO TABS
20.0000 mg | ORAL_TABLET | Freq: Every day | ORAL | Status: DC
  Administered 2016-11-02: 20 mg via ORAL
  Filled 2016-11-01: qty 1

## 2016-11-01 MED ORDER — PROTAMINE SULFATE 10 MG/ML IV SOLN
INTRAVENOUS | Status: DC | PRN
  Administered 2016-11-01: 40 mg via INTRAVENOUS
  Administered 2016-11-01 (×2): 30 mg via INTRAVENOUS

## 2016-11-01 MED ORDER — 0.9 % SODIUM CHLORIDE (POUR BTL) OPTIME
TOPICAL | Status: DC | PRN
  Administered 2016-11-01: 2000 mL

## 2016-11-01 MED ORDER — GABAPENTIN 600 MG PO TABS
300.0000 mg | ORAL_TABLET | Freq: Two times a day (BID) | ORAL | Status: DC
  Administered 2016-11-02: 300 mg via ORAL
  Filled 2016-11-01: qty 1

## 2016-11-01 MED ORDER — MAGNESIUM SULFATE 2 GM/50ML IV SOLN
2.0000 g | Freq: Every day | INTRAVENOUS | Status: DC | PRN
  Filled 2016-11-01: qty 50

## 2016-11-01 MED ORDER — SUGAMMADEX SODIUM 200 MG/2ML IV SOLN
INTRAVENOUS | Status: AC
  Filled 2016-11-01: qty 2

## 2016-11-01 MED ORDER — ONDANSETRON HCL 4 MG/2ML IJ SOLN
4.0000 mg | Freq: Four times a day (QID) | INTRAMUSCULAR | Status: DC | PRN
  Administered 2016-11-01: 4 mg via INTRAVENOUS

## 2016-11-01 MED ORDER — OXYCODONE HCL 5 MG PO TABS: 10.0000 mg | ORAL_TABLET | Freq: Two times a day (BID) | ORAL | Status: DC | PRN

## 2016-11-01 MED ORDER — SODIUM CHLORIDE 0.9 % IV SOLN: INTRAVENOUS | Status: DC

## 2016-11-01 MED ORDER — OXYCODONE-ACETAMINOPHEN 5-325 MG PO TABS
1.0000 | ORAL_TABLET | ORAL | Status: DC | PRN
  Administered 2016-11-01 (×2): 2 via ORAL
  Filled 2016-11-01: qty 2

## 2016-11-01 MED ORDER — ROCURONIUM BROMIDE 100 MG/10ML IV SOLN
INTRAVENOUS | Status: DC | PRN
  Administered 2016-11-01: 20 mg via INTRAVENOUS
  Administered 2016-11-01: 10 mg via INTRAVENOUS
  Administered 2016-11-01: 50 mg via INTRAVENOUS

## 2016-11-01 MED ORDER — FENTANYL CITRATE (PF) 250 MCG/5ML IJ SOLN
INTRAMUSCULAR | Status: AC
  Filled 2016-11-01: qty 5

## 2016-11-01 MED ORDER — HYDROMORPHONE HCL 1 MG/ML IJ SOLN
INTRAMUSCULAR | Status: AC
  Administered 2016-11-01: 0.5 mg via INTRAVENOUS
  Filled 2016-11-01: qty 2

## 2016-11-01 MED ORDER — DIAZEPAM 2 MG PO TABS: 2.0000 mg | ORAL_TABLET | Freq: Every day | ORAL | Status: DC | PRN

## 2016-11-01 MED ORDER — GUAIFENESIN-DM 100-10 MG/5ML PO SYRP: 15.0000 mL | ORAL_SOLUTION | ORAL | Status: DC | PRN

## 2016-11-01 MED ORDER — METOPROLOL TARTRATE 5 MG/5ML IV SOLN: 2.0000 mg | INTRAVENOUS | Status: DC | PRN

## 2016-11-01 MED ORDER — OXYCODONE-ACETAMINOPHEN 5-325 MG PO TABS
ORAL_TABLET | ORAL | Status: AC
  Administered 2016-11-01: 2 via ORAL
  Filled 2016-11-01: qty 2

## 2016-11-01 MED ORDER — HEPARIN SODIUM (PORCINE) 1000 UNIT/ML IJ SOLN
INTRAMUSCULAR | Status: DC | PRN
  Administered 2016-11-01: 10000 [IU] via INTRAVENOUS
  Administered 2016-11-01: 3000 [IU] via INTRAVENOUS

## 2016-11-01 MED ORDER — ONDANSETRON HCL 4 MG/2ML IJ SOLN
INTRAMUSCULAR | Status: AC
  Filled 2016-11-01: qty 2

## 2016-11-01 MED ORDER — PHENYLEPHRINE HCL 10 MG/ML IJ SOLN
INTRAVENOUS | Status: DC | PRN
  Administered 2016-11-01: 25 ug/min via INTRAVENOUS

## 2016-11-01 MED ORDER — POTASSIUM CHLORIDE CRYS ER 20 MEQ PO TBCR: 20.0000 meq | EXTENDED_RELEASE_TABLET | Freq: Every day | ORAL | Status: DC | PRN

## 2016-11-01 MED ORDER — PROPOFOL 10 MG/ML IV BOLUS
INTRAVENOUS | Status: AC
  Filled 2016-11-01: qty 20

## 2016-11-01 MED ORDER — PANTOPRAZOLE SODIUM 40 MG PO TBEC
40.0000 mg | DELAYED_RELEASE_TABLET | Freq: Every day | ORAL | Status: DC
  Administered 2016-11-02: 40 mg via ORAL
  Filled 2016-11-01: qty 1

## 2016-11-01 MED ORDER — PREDNISONE 10 MG PO TABS
10.0000 mg | ORAL_TABLET | Freq: Every day | ORAL | Status: DC
  Administered 2016-11-02: 10 mg via ORAL
  Filled 2016-11-01: qty 1

## 2016-11-01 MED ORDER — HYDROMORPHONE HCL 1 MG/ML IJ SOLN
0.2500 mg | INTRAMUSCULAR | Status: DC | PRN
  Administered 2016-11-01 (×4): 0.5 mg via INTRAVENOUS

## 2016-11-01 MED ORDER — ROCURONIUM BROMIDE 10 MG/ML (PF) SYRINGE
PREFILLED_SYRINGE | INTRAVENOUS | Status: AC
  Filled 2016-11-01: qty 5

## 2016-11-01 MED ORDER — ACETAMINOPHEN 325 MG RE SUPP
325.0000 mg | RECTAL | Status: DC | PRN
  Filled 2016-11-01: qty 2

## 2016-11-01 MED ORDER — ASPIRIN 325 MG PO TABS
325.0000 mg | ORAL_TABLET | ORAL | Status: AC
  Administered 2016-11-01: 325 mg via ORAL
  Filled 2016-11-01: qty 1

## 2016-11-01 MED ORDER — SUGAMMADEX SODIUM 500 MG/5ML IV SOLN
INTRAVENOUS | Status: DC | PRN
  Administered 2016-11-01: 500 mg via INTRAVENOUS

## 2016-11-01 MED ORDER — DEXTROSE 5 % IV SOLN
1.5000 g | Freq: Two times a day (BID) | INTRAVENOUS | Status: AC
  Administered 2016-11-01 – 2016-11-02 (×2): 1.5 g via INTRAVENOUS
  Filled 2016-11-01 (×2): qty 1.5

## 2016-11-01 MED ORDER — SODIUM CHLORIDE 0.9 % IV SOLN: 500.0000 mL | Freq: Once | INTRAVENOUS | Status: DC | PRN

## 2016-11-01 MED ORDER — EPHEDRINE SULFATE 50 MG/ML IJ SOLN
INTRAMUSCULAR | Status: DC | PRN
  Administered 2016-11-01 (×2): 10 mg via INTRAVENOUS

## 2016-11-01 MED ORDER — AMLODIPINE BESYLATE 5 MG PO TABS
5.0000 mg | ORAL_TABLET | Freq: Every day | ORAL | Status: DC
  Administered 2016-11-02: 5 mg via ORAL
  Filled 2016-11-01: qty 1

## 2016-11-01 MED ORDER — RIVAROXABAN 20 MG PO TABS: 20.0000 mg | ORAL_TABLET | Freq: Every day | ORAL | Status: DC

## 2016-11-01 MED ORDER — MUSCLE RUB 10-15 % EX CREA: 1.0000 "application " | TOPICAL_CREAM | Freq: Two times a day (BID) | CUTANEOUS | Status: DC | PRN

## 2016-11-01 MED ORDER — VITAMIN D3 25 MCG (1000 UNIT) PO TABS
2000.0000 [IU] | ORAL_TABLET | Freq: Every day | ORAL | Status: DC
  Administered 2016-11-02: 2000 [IU] via ORAL
  Filled 2016-11-01 (×2): qty 2

## 2016-11-01 MED ORDER — LACTATED RINGERS IV SOLN
INTRAVENOUS | Status: DC
  Administered 2016-11-01: 09:00:00 via INTRAVENOUS

## 2016-11-01 MED ORDER — LISINOPRIL-HYDROCHLOROTHIAZIDE 20-25 MG PO TABS: 1.0000 | ORAL_TABLET | Freq: Every day | ORAL | Status: DC

## 2016-11-01 MED ORDER — GLYCOPYRROLATE 0.2 MG/ML IJ SOLN
INTRAMUSCULAR | Status: DC | PRN
  Administered 2016-11-01 (×2): .2 mg via INTRAVENOUS

## 2016-11-01 MED ORDER — HYDROCHLOROTHIAZIDE 25 MG PO TABS
25.0000 mg | ORAL_TABLET | Freq: Every day | ORAL | Status: DC
  Administered 2016-11-02: 25 mg via ORAL
  Filled 2016-11-01: qty 1

## 2016-11-01 MED ORDER — DULOXETINE HCL 30 MG PO CPEP
30.0000 mg | ORAL_CAPSULE | Freq: Every day | ORAL | Status: DC
Start: 2016-11-02 — End: 2016-11-02
  Administered 2016-11-02: 30 mg via ORAL
  Filled 2016-11-01: qty 1

## 2016-11-01 MED ORDER — SUCCINYLCHOLINE CHLORIDE 200 MG/10ML IV SOSY
PREFILLED_SYRINGE | INTRAVENOUS | Status: AC
  Filled 2016-11-01: qty 10

## 2016-11-01 MED ORDER — MORPHINE SULFATE (PF) 4 MG/ML IV SOLN
INTRAVENOUS | Status: AC
  Administered 2016-11-01: 4 mg
  Filled 2016-11-01: qty 1

## 2016-11-01 MED ORDER — ALUM & MAG HYDROXIDE-SIMETH 200-200-20 MG/5ML PO SUSP: 15.0000 mL | ORAL | Status: DC | PRN

## 2016-11-01 MED ORDER — LIDOCAINE HCL (CARDIAC) 20 MG/ML IV SOLN
INTRAVENOUS | Status: DC | PRN
  Administered 2016-11-01: 100 mg via INTRAVENOUS

## 2016-11-01 MED ORDER — FENTANYL CITRATE (PF) 100 MCG/2ML IJ SOLN
INTRAMUSCULAR | Status: DC | PRN
  Administered 2016-11-01: 50 ug via INTRAVENOUS
  Administered 2016-11-01: 100 ug via INTRAVENOUS
  Administered 2016-11-01 (×2): 50 ug via INTRAVENOUS

## 2016-11-01 MED ORDER — GABAPENTIN 300 MG PO CAPS
600.0000 mg | ORAL_CAPSULE | Freq: Every day | ORAL | Status: DC
  Administered 2016-11-01: 600 mg via ORAL
  Filled 2016-11-01: qty 2

## 2016-11-01 MED ORDER — LIDOCAINE HCL (PF) 1 % IJ SOLN
INTRAMUSCULAR | Status: AC
  Filled 2016-11-01: qty 30

## 2016-11-01 MED ORDER — PROPOFOL 10 MG/ML IV BOLUS
INTRAVENOUS | Status: DC | PRN
  Administered 2016-11-01: 150 mg via INTRAVENOUS
  Administered 2016-11-01: 20 mg via INTRAVENOUS

## 2016-11-01 MED ORDER — ONDANSETRON HCL 4 MG/2ML IJ SOLN
INTRAMUSCULAR | Status: DC | PRN
  Administered 2016-11-01: 4 mg via INTRAVENOUS

## 2016-11-01 MED ORDER — ONDANSETRON HCL 4 MG/2ML IJ SOLN
INTRAMUSCULAR | Status: AC
  Administered 2016-11-01: 4 mg via INTRAVENOUS
  Filled 2016-11-01: qty 2

## 2016-11-01 MED ORDER — HYDROMORPHONE HCL 1 MG/ML IJ SOLN: 0.2500 mg | INTRAMUSCULAR | Status: DC | PRN

## 2016-11-01 MED ORDER — HYDRALAZINE HCL 20 MG/ML IJ SOLN: 5.0000 mg | INTRAMUSCULAR | Status: DC | PRN

## 2016-11-01 MED ORDER — LACTATED RINGERS IV SOLN
INTRAVENOUS | Status: DC | PRN
  Administered 2016-11-01 (×2): via INTRAVENOUS

## 2016-11-01 MED ORDER — ATROPINE SULFATE 0.4 MG/ML IJ SOLN
INTRAMUSCULAR | Status: DC | PRN
  Administered 2016-11-01: .2 mg via INTRAVENOUS
  Administered 2016-11-01: .4 mg via INTRAVENOUS
  Administered 2016-11-01: 0.2 mg via INTRAVENOUS
  Administered 2016-11-01: .2 mg via INTRAVENOUS
  Administered 2016-11-01: 0.2 mg via INTRAVENOUS

## 2016-11-01 MED ORDER — CLOPIDOGREL BISULFATE 75 MG PO TABS
75.0000 mg | ORAL_TABLET | Freq: Every day | ORAL | Status: DC
Start: 2016-11-02 — End: 2016-11-02
  Administered 2016-11-02: 75 mg via ORAL
  Filled 2016-11-01: qty 1

## 2016-11-01 MED ORDER — LIDOCAINE 2% (20 MG/ML) 5 ML SYRINGE
INTRAMUSCULAR | Status: AC
  Filled 2016-11-01: qty 5

## 2016-11-01 MED ORDER — IODIXANOL 320 MG/ML IV SOLN
INTRAVENOUS | Status: DC | PRN
  Administered 2016-11-01 (×2): 50 mL via INTRAVENOUS

## 2016-11-01 MED ORDER — LISINOPRIL 10 MG PO TABS
20.0000 mg | ORAL_TABLET | Freq: Every day | ORAL | Status: DC
  Administered 2016-11-02: 20 mg via ORAL
  Filled 2016-11-01: qty 2

## 2016-11-01 MED ORDER — ATORVASTATIN CALCIUM 10 MG PO TABS
10.0000 mg | ORAL_TABLET | Freq: Every day | ORAL | Status: DC
Start: 2016-11-02 — End: 2016-11-02
  Administered 2016-11-02: 10 mg via ORAL
  Filled 2016-11-01: qty 1

## 2016-11-01 MED ORDER — DEXAMETHASONE SODIUM PHOSPHATE 10 MG/ML IJ SOLN
INTRAMUSCULAR | Status: AC
  Filled 2016-11-01: qty 1

## 2016-11-01 MED ORDER — SODIUM CHLORIDE 0.9 % IV SOLN
INTRAVENOUS | Status: DC | PRN
  Administered 2016-11-01: 12:00:00

## 2016-11-01 MED ORDER — LABETALOL HCL 5 MG/ML IV SOLN: 10.0000 mg | INTRAVENOUS | Status: DC | PRN

## 2016-11-01 MED ORDER — PHENOL 1.4 % MT LIQD: 1.0000 | OROMUCOSAL | Status: DC | PRN

## 2016-11-01 MED ORDER — GABAPENTIN 600 MG PO TABS: 300.0000 mg | ORAL_TABLET | ORAL | Status: DC

## 2016-11-01 MED ORDER — DOCUSATE SODIUM 100 MG PO CAPS
100.0000 mg | ORAL_CAPSULE | Freq: Every day | ORAL | Status: DC
  Administered 2016-11-02: 100 mg via ORAL
  Filled 2016-11-01: qty 1

## 2016-11-01 SURGICAL SUPPLY — 69 items
ADH SKN CLS APL DERMABOND .7 (GAUZE/BANDAGES/DRESSINGS) ×2
AGENT HMST SPONGE THK3/8 (HEMOSTASIS)
BAG BANDED W/RUBBER/TAPE 36X54 (MISCELLANEOUS) ×2 IMPLANT
BAG EQP BAND 135X91 W/RBR TAPE (MISCELLANEOUS) ×1
BALLN VIATRAC 5X20X135 (BALLOONS) ×2
BALLOON VIATRAC 5X20X135 (BALLOONS) IMPLANT
CANISTER SUCT 3000ML PPV (MISCELLANEOUS) ×2 IMPLANT
CANNULA VESSEL 3MM 2 BLNT TIP (CANNULA) ×2 IMPLANT
CATH ROBINSON RED A/P 18FR (CATHETERS) IMPLANT
CLIP VESOCCLUDE MED 6/CT (CLIP) ×2 IMPLANT
CLIP VESOCCLUDE SM WIDE 6/CT (CLIP) ×2 IMPLANT
COVER DOME SNAP 22 D (MISCELLANEOUS) ×2 IMPLANT
COVER PROBE W GEL 5X96 (DRAPES) ×2 IMPLANT
CRADLE DONUT ADULT HEAD (MISCELLANEOUS) ×2 IMPLANT
DECANTER SPIKE VIAL GLASS SM (MISCELLANEOUS) IMPLANT
DERMABOND ADVANCED (GAUZE/BANDAGES/DRESSINGS) ×2
DERMABOND ADVANCED .7 DNX12 (GAUZE/BANDAGES/DRESSINGS) ×1 IMPLANT
DRAIN HEMOVAC 1/8 X 5 (WOUND CARE) IMPLANT
DRAPE INCISE IOBAN 66X45 STRL (DRAPES) ×4 IMPLANT
DRAPE UNIVERSAL PACK (DRAPES) ×2 IMPLANT
DRSG TEGADERM 2-3/8X2-3/4 SM (GAUZE/BANDAGES/DRESSINGS) ×1 IMPLANT
ELECT REM PT RETURN 9FT ADLT (ELECTROSURGICAL) ×2
ELECTRODE REM PT RTRN 9FT ADLT (ELECTROSURGICAL) ×1 IMPLANT
EVACUATOR SILICONE 100CC (DRAIN) IMPLANT
GLOVE BIO SURGEON STRL SZ7.5 (GLOVE) ×2 IMPLANT
GLOVE BIOGEL PI IND STRL 6.5 (GLOVE) IMPLANT
GLOVE BIOGEL PI INDICATOR 6.5 (GLOVE) ×1
GLOVE SS N UNI LF 6.5 STRL (GLOVE) ×1 IMPLANT
GOWN STRL NON-REIN LRG LVL3 (GOWN DISPOSABLE) ×1 IMPLANT
GOWN STRL REUS W/ TWL LRG LVL3 (GOWN DISPOSABLE) ×3 IMPLANT
GOWN STRL REUS W/TWL LRG LVL3 (GOWN DISPOSABLE) ×6
GUIDEWIRE ENROUTE 0.014 (WIRE) ×1 IMPLANT
HEMOSTAT SPONGE AVITENE ULTRA (HEMOSTASIS) IMPLANT
INTRODUCER KIT GALT 7CM (INTRODUCER) ×2
KIT BASIN OR (CUSTOM PROCEDURE TRAY) ×2 IMPLANT
KIT ENCORE 26 ADVANTAGE (KITS) ×2 IMPLANT
KIT INTRODUCER GALT 7 (INTRODUCER) IMPLANT
KIT ROOM TURNOVER OR (KITS) ×2 IMPLANT
NDL HYPO 25GX1X1/2 BEV (NEEDLE) IMPLANT
NEEDLE HYPO 25GX1X1/2 BEV (NEEDLE) IMPLANT
NS IRRIG 1000ML POUR BTL (IV SOLUTION) ×4 IMPLANT
PACK CAROTID (CUSTOM PROCEDURE TRAY) ×2 IMPLANT
PAD ARMBOARD 7.5X6 YLW CONV (MISCELLANEOUS) ×4 IMPLANT
PROTECTION STATION PRESSURIZED (MISCELLANEOUS) ×4
SET MICROPUNCTURE 5F STIFF (MISCELLANEOUS) ×2 IMPLANT
SHEATH AVANTI 11CM 5FR (MISCELLANEOUS) IMPLANT
SHUNT CAROTID BYPASS 10 (VASCULAR PRODUCTS) IMPLANT
SHUNT CAROTID BYPASS 12FRX15.5 (VASCULAR PRODUCTS) IMPLANT
STATION PROTECTION PRESSURIZED (MISCELLANEOUS) ×1 IMPLANT
STENT TRANSCAROTID SYSTEM 8X40 (Permanent Stent) ×1 IMPLANT
STOPCOCK MORSE 400PSI 3WAY (MISCELLANEOUS) ×2 IMPLANT
SUT ETHILON 3 0 PS 1 (SUTURE) IMPLANT
SUT PROLENE 6 0 CC (SUTURE) ×2 IMPLANT
SUT SILK 2 0SH CR/8 30 (SUTURE) ×2 IMPLANT
SUT SILK 3 0 TIES 17X18 (SUTURE)
SUT SILK 3-0 18XBRD TIE BLK (SUTURE) IMPLANT
SUT VIC AB 3-0 SH 27 (SUTURE) ×2
SUT VIC AB 3-0 SH 27X BRD (SUTURE) ×1 IMPLANT
SUT VICRYL 4-0 PS2 18IN ABS (SUTURE) ×2 IMPLANT
SYR 20CC LL (SYRINGE) ×2 IMPLANT
SYR 5ML LL (SYRINGE) ×2 IMPLANT
SYR CONTROL 10ML LL (SYRINGE) IMPLANT
SYRINGE 10CC LL (SYRINGE) ×6 IMPLANT
SYSTEM TRANSCAROTID NEUROPRTCT (MISCELLANEOUS) IMPLANT
TRANSCAROTID NEUROPROTECT SYS (MISCELLANEOUS) ×2
TUBING EXTENTION W/L.L. (IV SETS) ×2 IMPLANT
WATER STERILE IRR 1000ML POUR (IV SOLUTION) ×2 IMPLANT
WIRE AMPLATZ SS-J .035X180CM (WIRE) IMPLANT
WIRE BENTSON .035X145CM (WIRE) ×2 IMPLANT

## 2016-11-01 NOTE — Anesthesia Preprocedure Evaluation (Addendum)
Anesthesia Evaluation  Patient identified by MRN, date of birth, ID band Patient awake    Reviewed: Allergy & Precautions, H&P , NPO status   Airway Mallampati: I  TM Distance: >3 FB Neck ROM: full    Dental  (+) Edentulous Upper, Edentulous Lower, Dental Advidsory Given   Pulmonary shortness of breath and with exertion, Current Smoker,    breath sounds clear to auscultation       Cardiovascular hypertension, + CAD and + Peripheral Vascular Disease  + Valvular Problems/Murmurs  Rhythm:regular Rate:Normal     Neuro/Psych  Headaches,    GI/Hepatic GERD  Medicated and Controlled,  Endo/Other    Renal/GU Renal disease     Musculoskeletal  (+) Arthritis ,   Abdominal   Peds  Hematology   Anesthesia Other Findings   Reproductive/Obstetrics                            Anesthesia Physical Anesthesia Plan  ASA: III  Anesthesia Plan: General   Post-op Pain Management:    Induction: Intravenous  PONV Risk Score and Plan: 2 and Ondansetron and Dexamethasone  Airway Management Planned: Oral ETT  Additional Equipment:   Intra-op Plan:   Post-operative Plan: Possible Post-op intubation/ventilation  Informed Consent:   Dental Advisory Given  Plan Discussed with: Anesthesiologist, CRNA and Surgeon  Anesthesia Plan Comments:        Anesthesia Quick Evaluation

## 2016-11-01 NOTE — Progress Notes (Signed)
Pt awake in PACU.   Moves upper and lower extremities symmetrically. No hoarseness. Neck and groin without hematoma. Complains of right first toe pain from her gout.  2+ DP pulses  To 4e stepdown when bed available Start Xarelto back tomorrow  Ruta Hinds, MD [facility] Office: [phone] Pager: [phone]

## 2016-11-01 NOTE — Progress Notes (Signed)
Admitted to floor from PACU, report received from [place], RN-PACU . Patient is resting no s/sx of distress. Right neck incision with liquid skin glue closed, well approximated without hematoma or bleeding. Left groin opened to air, no signs of hematoma or bleeding. DP pulses palpable, skin is warm and dry. Zeroed ART line. Vitals stable

## 2016-11-01 NOTE — Anesthesia Postprocedure Evaluation (Signed)
Anesthesia Post Note  Patient: Jennifer [name]  Procedure(s) Performed: Procedure(s) (LRB): TRANSCAROTID ARTERY REVASCULARIZATION- RIGHT (Right)     Patient location during evaluation: PACU Anesthesia Type: General Level of consciousness: awake Pain management: pain level controlled Vital Signs Assessment: post-procedure vital signs reviewed and stable Respiratory status: spontaneous breathing Cardiovascular status: stable Anesthetic complications: no    Last Vitals:  Vitals:   11/01/16 1630 11/01/16 1645  BP:    Pulse: 61 62  Resp: 18 15  Temp:    SpO2: 100% 100%    Last Pain:  Vitals:   11/01/16 1445  TempSrc:   PainSc: Asleep                 Nelia Rogoff

## 2016-11-01 NOTE — Anesthesia Procedure Notes (Signed)
Procedure Name: Intubation Date/Time: 11/01/2016 10:48 AM Performed by: Neldon Newport Pre-anesthesia Checklist: Timeout performed, Patient being monitored, Suction available, Emergency Drugs available and Patient identified Patient Re-evaluated:Patient Re-evaluated prior to induction Oxygen Delivery Method: Circle system utilized Preoxygenation: Pre-oxygenation with 100% oxygen Induction Type: IV induction Ventilation: Mask ventilation without difficulty Laryngoscope Size: Mac and 3 Grade View: Grade I Tube type: Oral Tube size: 7.0 mm Number of attempts: 1 Placement Confirmation: breath sounds checked- equal and bilateral,  positive ETCO2 and ETT inserted through vocal cords under direct vision Secured at: 21 cm Tube secured with: Tape Dental Injury: Teeth and Oropharynx as per pre-operative assessment

## 2016-11-01 NOTE — H&P (View-Only) (Signed)
Referring Physician: Gae Gallop, MD Patient name: Jennifer [name] MRN: [id number] DOB: 01/21/1951 Sex: female  REASON FOR CONSULT: Consideration for TCAR carotid stenting right side  HPI: Jennifer [name] is a 66 y.o. female  referred by Dr. Scot Dock for evaluation for possible TCAR. The patient was recently seen for possible symptomatic right internal carotid artery stenosis with left arm weakness symptoms. He apparently had some dizziness as well and states she may have even had 2 syncopal events. Carotid duplex in July 2018 showed 70-99% right carotid stenosis and 50-70% left carotid stenosis. On CT Angio was thought that her stenosis was a high lesion that may be difficult to access from a transcervical approach. The patient currently is on Xarelto for prior DVT and pulmonary embolus. 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 Plavix was recently added to this. She has had no bleeding problems so far. Other medical problems include coronary artery disease followed by Kerin Ransom and recently seen in May 2018. Cardiology risk stratification for intervention is currently pending. She has a single kidney. She also has a history of hypertension hyperlipidemia and neuropathy all of which are currently stable. She currently smokes 1 pack of cigarettes per day. Brain MRI from April 2018 showed no evidence of stroke.  Past Medical History:  Diagnosis Date  . Anxiety   . Arthritis   . CAD (coronary artery disease)    a. Cath 12/2014 - mild-moderate non-obstructive CAD with 60% mid RCA, 50% mid Cx-distal Cx, 30% distal LAD, 25% D2, EF 55-65%.  . Depression   . Dizziness   . DVT (deep venous thrombosis) ([place])   . Dyspnea    W/ EXERTION  . GERD (gastroesophageal reflux disease)   . Headache   . Heart murmur    DX   AT BIRTH  . Hyperlipidemia   . Hypertension   . 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 Neuropathy   . PE (pulmonary  embolism)   . Tobacco abuse    Past Surgical History:  Procedure Laterality Date  . ABDOMINAL HYSTERECTOMY    . CARDIAC CATHETERIZATION N/A 01/08/2015   Procedure: Left Heart Cath and Coronary Angiography;  Surgeon: Jolaine Artist, MD;  Location: [facility];  Service: Cardiovascular;  Laterality: N/A;  . CARPAL TUNNEL RELEASE     RIGHT  . KNEE ARTHROSCOPY W/ MENISCAL REPAIR     LEFT KNEE   FORMED DVT (SURG TO REMOVE BLOOD CLOT)  . TONSILLECTOMY      Family History  Problem Relation Age of Onset  . Heart attack Mother   . Heart attack Father   . Heart attack Brother        CABG  . Heart attack Brother        CABG  . Heart attack Brother        CABG  . Ovarian cancer Maternal Grandmother   . Heart disease Maternal Grandfather   . Dementia Paternal Grandmother     SOCIAL HISTORY: Social History   Social History  . Marital status: Divorced    Spouse name: N/A  . Number of children: 2  . Years of education: 12   Occupational History  . Retired    Social History Main Topics  . Smoking status: Current Every Day Smoker    Packs/day: 1.00    Types: Cigarettes  . Smokeless tobacco: Never Used  Comment: Down to 1/2 pk per day.   . Alcohol use No  . Drug use: No  . Sexual activity: Not on file   Other Topics Concern  . Not on file   Social History Narrative   Lives at home alone.   Right-handed.   2 cups caffeine per day.    Allergies  Allergen Reactions  . Bee Venom Anaphylaxis    Current Outpatient Prescriptions  Medication Sig Dispense Refill  . acetaminophen (TYLENOL 8 HOUR) 650 MG CR tablet Take 650 mg by mouth every 8 (eight) hours as needed for pain.    Marland Kitchen amLODipine (NORVASC) 5 MG tablet Take 5 mg by mouth daily.    Marland Kitchen aspirin 81 MG chewable tablet Chew 81 mg by mouth daily.    . Cholecalciferol (VITAMIN D) 2000 units CAPS Take 2,000 Units by mouth daily.    . clopidogrel (PLAVIX) 75 MG tablet Take 1 tablet (75 mg total) by mouth daily.  30 tablet 11  . diazepam (VALIUM) 2 MG tablet Take 2 mg by mouth daily as needed for anxiety.     . DULoxetine (CYMBALTA) 30 MG capsule Take 30 mg by mouth daily.    Marland Kitchen gabapentin (NEURONTIN) 600 MG tablet Take 300-600 mg by mouth See admin instructions. Take 300 mg twice daily IN THE MORNING AND AT NOON AND  600 mg at bedtime.    Marland Kitchen lisinopril-hydrochlorothiazide (PRINZIDE,ZESTORETIC) 20-25 MG per tablet Take 1 tablet by mouth daily.      . Menthol-Methyl Salicylate (MUSCLE RUB) 10-15 % CREA Apply 1 application topically 2 (two) times daily as needed for muscle pain.    . ranitidine (ZANTAC) 150 MG tablet Take 150 mg by mouth daily as needed for heartburn.    . rivaroxaban (XARELTO) 20 MG TABS tablet Take 20 mg by mouth daily with supper.    . Vitamin D, Ergocalciferol, (DRISDOL) 50000 units CAPS capsule Take one capsule weekly x 8 weeks then convert to OTC Vitamin D 2000 units daily thereafter. 8 capsule 0   No current facility-administered medications for this visit.     ROS:   General:  No weight loss, Fever, chills  HEENT: No recent headaches, no nasal bleeding, no visual changes, no sore throat  Neurologic: No dizziness, blackouts, seizures. +recent symptoms of stroke or mini- stroke. No recent episodes of slurred speech, or temporary blindness.  Cardiac: No recent episodes of chest pain/pressure, no shortness of breath at rest.  +shortness of breath with exertion.  Denies history of atrial fibrillation or irregular heartbeat  Vascular: No history of rest pain in feet.  No history of claudication.  No history of non-healing ulcer, No history of DVT   Pulmonary: No home oxygen, no productive cough, no hemoptysis,  No asthma or wheezing  Musculoskeletal:  [X]  Arthritis, [ ]  Low back pain,  [X]  Joint pain  Hematologic:No history of hypercoagulable state.  No history of easy bleeding.  No history of anemia  Gastrointestinal: No hematochezia or melena,  No gastroesophageal reflux, no  trouble swallowing  Urinary: [ ]  chronic Kidney disease, [ ]  on HD - [ ]  MWF or [ ]  TTHS, [ ]  Burning with urination, [ ]  Frequent urination, [ ]  Difficulty urinating;   Skin: No rashes  Psychological: No history of anxiety,  No history of depression   Physical Examination  Vitals:   10/26/16 1455 10/26/16 1500  BP: 137/75 138/80  Pulse: 78   Resp: 16   Temp: 98.4 F (36.9 C)  TempSrc: Oral   SpO2: 97%   Weight: 220 lb (99.8 kg)   Height: 5\' 4"  (1.626 m)     Body mass index is 37.76 kg/m.  General:  Alert and oriented, no acute distress HEENT: Normal Neck: No bruit or JVD Pulmonary: Clear to auscultation bilaterally Cardiac: Regular Rate and Rhythm without murmur Abdomen: Soft, non-tender, non-distended, no mass Skin: No rash Extremity Pulses:  2+ radial, brachial, femoral pulses bilaterally Musculoskeletal: No deformity or edema  Neurologic: Upper and lower extremity motor 5/5 and symmetric, no facial asymmetry  DATA:  I reviewed the patient's recent CT angiogram. There is some calcification at the carotid bifurcation with about a 70% stenosis. The lesion in its at about the C2-C3 interface level but certainly clamping above this would place the lesion and a fairly high distal extent.  ASSESSMENT:  Patient with possible symptomatic right internal carotid artery stenosis with TIA symptoms involving right brain with left arm weakness and clumsiness. Although she has some calcification at the carotid bifurcation I do not believe that this is circumferential and I believe she would be a reasonable candidate for TCAR stenting. Risks benefits possible complications and procedure details including but not limited to bleeding infection cranial nerve injury stroke were discussed with the patient today.  PLAN:  I will review these images with my partner Dr. Donzetta Matters, tentatively we will schedule the patient for her procedure next Wednesday. If we decide she is not a stent candidate she  is currently on the schedule for Dr. Scot Dock to do a carotid endarterectomy on Tuesday. We will continue to obtain the cardiology risk stratification prior to this. If we proceed with stenting she will need to be on Plavix in addition to her Xarelto for at least 6 weeks.   Ruta Hinds, MD [facility] Office: [phone] Pager: [phone]

## 2016-11-01 NOTE — Transfer of Care (Signed)
Immediate Anesthesia Transfer of Care Note  Patient: Jennifer [name]  Procedure(s) Performed: Procedure(s): TRANSCAROTID ARTERY REVASCULARIZATION- RIGHT (Right)  Patient Location: PACU  Anesthesia Type:General  Level of Consciousness: awake, alert  and oriented  Airway & Oxygen Therapy: Patient Spontanous Breathing and Patient connected to face mask oxygen  Post-op Assessment: Report given to RN, Post -op Vital signs reviewed and stable and Patient moving all extremities X 4  Post vital signs: Reviewed and stable  Last Vitals:  Vitals:   11/01/16 0808  BP: (!) 194/54  Pulse: 64  Resp: 20  Temp: 36.7 C  SpO2: 98%    Last Pain:  Vitals:   11/01/16 0816  TempSrc:   PainSc: 8          Complications: No apparent anesthesia complications

## 2016-11-01 NOTE — Op Note (Addendum)
Procedure: Trans carotid artery revascularization (TCAR) right side  Preoperative diagnosis: Symptomatic right internal carotid artery stenosis  Postoperative diagnosis: Same  Anesthesia: Gen.  Assistant: Servando Snare M.D.  Indications: Patient is an 66 year old female who recently sustained a right brain TIA with left arm weakness.  She has a high carotid lesion anatomically  Operative findings: #1 70% right internal carotid artery stenosis stented to residual less than 20% stenosis (8 x 40 mm ENROUTE stent)  #2 left femoral venous access  #3 flow reversal time was 18 minutes  Operative details: After obtaining informed consent, the patient was taken the operating room. The patient was placed in supine position upper table. After induction of general anesthesia and endotracheal intubation, Foley catheter was placed. Next the patient was inspected with ultrasound on the right neck. Measurements were taken from the right carotid bifurcation down to the level of the clavicle on the common carotid. This was approximate 7 cm.At this point the entire right neck and chest were prepped and draped in usual sterile fashion. The left groin was also prepped and draped in usual sterile fashion. A time out was performed. Next a oblique incision was made between the heads of the sternocleidomastoid muscle on the right side of the neck. Incision was carried down through the platysma to the level of the right internal jugular vein. This was reflected laterally. The vagus nerve was identified and protected. Common carotid artery was dissected free circumferentially at the base of the incision. This was elevated up in the operative field with an umbilical tape. Approximately 3-4 cm of the artery was dissected free circumferentially to allow adequate exposure. A pursestring suture was placed in the artery at the area we were planning to puncture using a running 6-0 Prolene suture.At this point femoral venous access was  established via the left groin. Ultrasound was used to identify the left common femoral vein. A micropuncture needle was used to cannulate the leftt common femoral vein and a micropuncture wire advanced into the vein and the micropuncture sheath placed over this. An 75 Bentsen wire was advanced up into the left femoral venous system. The sheath for the flow reversal system was placed into the left femoral system and thoroughly flushed with heparinized saline. The patient was given 13,000 units of total heparin to establish an ACT greater than 250. At this point a micropuncture needle was used to cannulate the right common carotid artery. The micropuncture wire was advanced just below the level of the carotid bifurcation. The micropuncture sheath was then advanced over this about 2 cm into the common carotid artery. This was thoroughly flushed with heparinized saline.a contrast angiogram was then performed in AP projection to confirm that we were truly intraluminal with the sheath. This was also used to determine the level of the carotid bifurcation. The carotid Amplatz wire was then placed through the micropuncture sheath and the sheath removed. The 8 French flow reversal arterial sheath was then advanced over the guidewire and into the common carotid artery. Sheath was sutured to the skin with 3 2-0 silk sutures.Contrast angiogram was again performed to make sure that we were within the true lumen. This was done in 2 views.  The filter device was switched to the low-flow selection. The flow reversal system was then hooked up to the arterial end of the sheath after everything had been thoroughly flushed and de-aired. Passive flow was used to fill the arterial and of the filter and through the filter and up to the  level of the venous sheath. The venous sheath was also fully de-aired and passive flow was established. This was checked by saline infusion in the venous port and noted to flush easily. High flow was  then turned on on the filter device. At this point a TCAR timeout was performed to make sure that the patient's blood pressure was reasonable. It was systolic 009 at this point. We again confirmed that the ACT was above 250. The balloon and guidewire insufflator and stent were all selected. These were all prepped. A 5 x 30 mm angioplasty balloon had been selected based on the preoperative CT. Based on the angiogram intraoperatively as well as preoperative CT and 8 x 40 mm Enroute stent was selected. The 014 wire was slightly shaped opening and due to slight curvature of the internal carotid artery. This was advanced with the balloon over it into the common carotid artery and a guidewire used to selectively catheterize the left internal carotid artery. The lesion was moderately calcified and tortuous turning back on itself..We confirmed that we were within the internal carotid artery by first establishing that the guidewire advanced into the petrous portion of the internal carotid artery and also with contrast angiogram. At this point the 5 x 30 balloon was centered on the lesion and inflated to 8 atm slowly inflating and deflating the balloon. The patient had been given a dose of glycopyrrolate as well as atropine and an additional dose of glycopyrrolate to establish and maintain her blood pressure and heart rate. After predilatation a contrast angiogram was again performed which showed some improvement in the stenosis. We then brought up the 8 x 40 Enroute stent and advanced and centered this on the lesion.  This was then deployed using a pinch technique after opening the Tuohy Borst valve.  We then gave the patient 2 minutes of additional flow reversal before performing a completion angiogram.  Completion angiogram showed a residual 40% waist.  So a post dilation was performed with the 5 mm balloon centered on the lesion. An AP and lateral angiogram were performed to confirm that the stent was fully deployed after  post dilation. At this point the guidewire was removed. The flow reversal system was clamped off at the arterial sheath and disconnected. The remaining blood was returned to the patient. This was then disconnected from the venous sheath. The arterial sheath was removed and the pursestring suture cinched down. The patient was given 150 mg of protamine to achieve hemostasis. The venous sheath was pulled and hemostasis obtained with direct pressure. The carotid was inspected found to be without any area of hematoma or active bleeding. Pt was given 130 mg of protamine. The platysma muscle was reapproximated using a running 3-0 Vicryl suture. The skin was closed with a 4-0 Vicryl subcuticular stitch. Dermabond was applied to the incision. Patient was awakened in the operating room and moving her upper and lower extremities symmetrically. She was taken to the recovery room in stable condition.  Dr. Donzetta Matters assisted on the femoral vein cannulation as well as exposure of the common carotid artery for placement of the stent  Ruta Hinds, MD [facility]: [phone] Pager: [phone]

## 2016-11-01 NOTE — Interval H&P Note (Signed)
History and Physical Interval Note:  11/01/2016 10:21 AM  Jennifer [name]  has presented today for surgery, with the diagnosis of Right Internal Carotid Artery Stenosis I65.21  The various methods of treatment have been discussed with the patient and family. After consideration of risks, benefits and other options for treatment, the patient has consented to  Procedure(s): TRANSCAROTID ARTERY REVASCULARIZATION- RIGHT (Right) as a surgical intervention .  The patient's history has been reviewed, patient examined, no change in status, stable for surgery.  I have reviewed the patient's chart and labs.  Questions were answered to the patient's satisfaction.     Ruta Hinds

## 2016-11-02 LAB — BASIC METABOLIC PANEL
Anion gap: 8 (ref 5–15)
BUN: 15 mg/dL (ref 6–20)
CO2: 28 mmol/L (ref 22–32)
Calcium: 8.5 mg/dL — ABNORMAL LOW (ref 8.9–10.3)
Chloride: 102 mmol/L (ref 101–111)
Creatinine, Ser: 1.59 mg/dL — ABNORMAL HIGH (ref 0.44–1.00)
GFR calc Af Amer: 38 mL/min — ABNORMAL LOW (ref >60)
GFR calc non Af Amer: 33 mL/min — ABNORMAL LOW (ref >60)
Glucose, Bld: 85 mg/dL (ref 65–99)
Potassium: 3.9 mmol/L (ref 3.5–5.1)
Sodium: 138 mmol/L (ref 135–145)

## 2016-11-02 LAB — CBC
HCT: 37.2 % (ref 36.0–46.0)
Hemoglobin: 11.9 g/dL — ABNORMAL LOW (ref 12.0–15.0)
MCH: 28.8 pg (ref 26.0–34.0)
MCHC: 32 g/dL (ref 30.0–36.0)
MCV: 90.1 fL (ref 78.0–100.0)
Platelets: 317 10*3/uL (ref 150–400)
RBC: 4.13 MIL/uL (ref 3.87–5.11)
RDW: 15.2 % (ref 11.5–15.5)
WBC: 8.8 10*3/uL (ref 4.0–10.5)

## 2016-11-02 LAB — POCT ACTIVATED CLOTTING TIME
Activated Clotting Time: 235 seconds
Activated Clotting Time: 252 seconds

## 2016-11-02 NOTE — Consult Note (Signed)
[facility] CM Primary Care Navigator  11/02/2016  Jennifer [name] 09-02-1950 [id number]   Met withpatientat the bedside to identify possible discharge needs.  Patient reports having "dizzy spells" and syncopal episodes that had led to this admission. Patient endorses Dr. Purcell Nails [facility] as the primary care provider.   Patient shared using [employer] in [place] to obtain medications without difficulty.   She reports managingher medications at home with use of "pill box" system filled weekly.  Patient verbalized that she was driving prior to admission but her sister in-law [name]) will providetransportation to her doctors'appointments after discharge.  Patient lives alone and independent with self care. Her sister in-law (lives a mile away) will be assisting with her care needs at home as stated.  Discharge plan is home according to patient.  Patient expressedunderstanding to call primary care provider's officewhen she gets home,for a post discharge follow-up appointment within a week or sooner if needed.Patient letter (with PCP's contact number) was provided as areminder.  Explained to patient about [facility] CM services available for health management but she states being "alright for now" and "not having issues so far".  She declined EMMI calls to follow-up with her recovery at home. Patient voiced  understanding to seekreferral to [facility] care management servicesfrom primary care provider if necessary and appropriate in the future.   [facility] care management information provided for future needs that may arise.    For questions, please contact:  Dannielle Huh, BSN, RN- [facility] Primary Care Navigator  Telephone: [phone] [place]

## 2016-11-02 NOTE — Care Management Note (Signed)
Case Management Note Marvetta Gibbons RN, BSN Unit 4E-Case Manager [phone]  Patient Details  Name: Jennifer [name] MRN: [id number] Date of Birth: 05-14-1950  Subjective/Objective:  Pt admitted s/p TCAR on 11/01/16                  Action/Plan: PTA pt lived at home, independent- plan to return home on discharge- no CM needs noted for discharge.    Expected Discharge Date:  11/02/16               Expected Discharge Plan:  Home/Self Care  In-House Referral:  NA  Discharge planning Services  CM Consult  Post Acute Care Choice:  NA Choice offered to:  NA  DME Arranged:    DME Agency:     HH Arranged:    HH Agency:     Status of Service:  Completed, signed off  If discussed at [place] of Stay Meetings, dates discussed:    Discharge Disposition: home/self care   Additional Comments:  Dawayne Patricia, RN 11/02/2016, 10:50 AM

## 2016-11-02 NOTE — Progress Notes (Signed)
Pt dangled on side of bed this AM. Tolerated well. Ambulated to [facility] with x1 assist. Pt put back to bed, will continue to monitor.

## 2016-11-02 NOTE — Progress Notes (Addendum)
[facility] [place]  Subjective  - doing well and has voided   Objective (!) 134/45 (!) 58 97.8 F (36.6 C) (Oral) 14 94%  Intake/Output Summary (Last 24 hours) at 11/02/16 0715 Last data filed at 11/02/16 0400  Gross per 24 hour  Intake          2180.55 ml  Output              450 ml  Net          1730.55 ml    Palpable radial pulses No tongue deviation, smile symmetric No hoarseness  Heart RRR Lungs non labored breathing   Assessment/Planning: POD # 1 TCAR Patient is alert and oriented, voided and toleration PO's.  Plan to discharge home after breakfast.  Systolic 195, recommend systolic < 093 prior to discharge home.  Discussed with Nurse.   F/U 2-4 weeks with repeat  Carotid Duplex. Patient will continue Plavix and Xarelto.  Laurence Slate [facility] 11/02/2016 7:15 AM -- Agree with above.  Neuro intact Still gout pain right foot Incision right neck clean no hematoma Left groin no hematoma  Ruta Hinds, MD [facility] [place]: [phone] Pager: [phone]  Laboratory Lab Results:  Recent Labs  11/02/16 0340  WBC 8.8  HGB 11.9*  HCT 37.2  PLT 317   BMET  Recent Labs  11/02/16 0340  NA 138  K 3.9  CL 102  CO2 28  GLUCOSE 85  BUN 15  CREATININE 1.59*  CALCIUM 8.5*    COAG Lab Results  Component Value Date   INR 1.33 10/26/2016   INR 1.15 01/07/2015   INR 1.31 10/08/2014   No results found for: PTT

## 2016-11-02 NOTE — Discharge Instructions (Signed)

## 2016-11-02 NOTE — Progress Notes (Signed)
Left radial arterial line discontinued, catheter tip intact. Site lightly bruised, otherwise benign. Pressure dressing applied.

## 2016-11-02 NOTE — Progress Notes (Addendum)
Discharged to home. Verbalized understanding of all written and verbal instructions. No prescriptions. Discussed f/u appointments and time. Medication reconciliation list discussed. Left unit via wheelchair accompanied by family and NT.

## 2016-11-07 NOTE — Discharge Summary (Signed)
[facility] Discharge Summary   Patient ID:  Jennifer [name] MRN: [id number] DOB/AGE: 67-Sep-1952 67 y.o.  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 Anxiety   . Arthritis   . CAD (coronary artery disease)    a. Cath 12/2014 - mild-moderate non-obstructive CAD with 60% mid RCA, 50% mid Cx-distal Cx, 30% distal LAD, 25% D2, EF 55-65%.  . Depression   . Dizziness   . DVT (deep venous thrombosis) ([place])   . Dyspnea    W/ EXERTION  . GERD (gastroesophageal reflux disease)   . Headache   . Heart murmur    DX   AT BIRTH  . Hyperlipidemia   . Hypertension   . 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 Neuropathy   . PE (pulmonary embolism)   . Tobacco abuse     Procedure(s): TRANSCAROTID ARTERY REVASCULARIZATION- RIGHT  Discharged Condition: good  HPI: Jennifer [name] is a 66 y.o. female  referred by Dr. Scot Dock for evaluation for possible TCAR. The patient was recently seen for possible symptomatic right internal carotid artery stenosis with left arm weakness symptoms. He apparently had some dizziness as well and states she may have even had 2 syncopal events. Carotid duplex in July 2018 showed 70-99% right carotid stenosis and 50-70% left carotid stenosis. On CT Angio was thought that her stenosis was a high lesion that may be difficult to access from a transcervical approach. The patient currently is on Xarelto for prior DVT and pulmonary embolus. 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 Plavix was recently added to this. She has had no bleeding problems so far. Other medical problems include  coronary artery disease followed by Kerin Ransom and recently seen in May 2018. Cardiology risk stratification for intervention is currently pending. She has a single kidney. She also has a history of hypertension hyperlipidemia and neuropathy all of which are currently stable. She currently smokes 1 pack of cigarettes per day. Brain MRI from April 2018 showed no evidence of stroke.   Hospital Course:  Jennifer [name] is a 66 y.o. female is S/P  Procedure(s): TRANSCAROTID ARTERY REVASCULARIZATION- RIGHT Palpable radial pulses No tongue deviation, smile symmetric No hoarseness  Heart RRR Lungs non labored breathing   Assessment/Planning: POD # 1 TCAR Patient is alert and oriented, voided and toleration PO's.  Plan to discharge home after breakfast.  Discussed with Nurse.   F/U 2-4 weeks with repeat  Carotid Duplex. Patient will continue Plavix and Xarelto.    Significant Diagnostic Studies: CBC Lab Results  Component Value Date   WBC 8.8 11/02/2016   HGB 11.9 (L) 11/02/2016   HCT 37.2 11/02/2016   MCV 90.1 11/02/2016   PLT 317 11/02/2016    BMET    Component Value Date/Time   NA 138 11/02/2016 0340   K 3.9 11/02/2016 0340   CL 102 11/02/2016 0340   CO2 28 11/02/2016 0340   GLUCOSE 85 11/02/2016 0340   BUN 15 11/02/2016 0340   CREATININE 1.59 (H) 11/02/2016 0340   CALCIUM 8.5 (L) 11/02/2016 0340   GFRNONAA 33 (L) 11/02/2016 0340   GFRAA 38 (L) 11/02/2016 0340   COAG  Lab Results  Component Value Date   INR 1.33 10/26/2016   INR 1.15 01/07/2015   INR 1.31 10/08/2014     Disposition:  Discharge to :Home Discharge Instructions    Call MD for:  redness, tenderness, or signs of infection (pain, swelling, bleeding, redness, odor or green/yellow discharge around incision site)    Complete by:  As directed    Call MD for:  severe or increased pain, loss or decreased feeling  in affected limb(s)    Complete by:  As directed    Call MD for:  temperature >100.5     Complete by:  As directed    Discharge instructions    Complete by:  As directed    You may shower in 24 hours.   Driving Restrictions    Complete by:  As directed    No driving for 2 weeks   Increase activity slowly    Complete by:  As directed    Walk with assistance use walker or cane as needed   Lifting restrictions    Complete by:  As directed    No lifting for 3 weeks   Resume previous diet    Complete by:  As directed      Allergies as of 11/02/2016      Reactions   Bee Venom Anaphylaxis      Medication List    TAKE these medications   amLODipine 5 MG tablet Commonly known as:  NORVASC Take 5 mg by mouth daily.   atorvastatin 10 MG tablet Commonly known as:  LIPITOR Take 1 tablet (10 mg total) by mouth daily.   clopidogrel 75 MG tablet Commonly known as:  PLAVIX Take 1 tablet (75 mg total) by mouth daily.   diazepam 2 MG tablet Commonly known as:  VALIUM Take 2 mg by mouth daily as needed for anxiety.   DULoxetine 30 MG capsule Commonly known as:  CYMBALTA Take 30 mg by mouth daily.   gabapentin 600 MG tablet Commonly known as:  NEURONTIN Take 300-600 mg by mouth See admin instructions. Take 300 mg twice daily IN THE MORNING AND AT NOON AND  600 mg at bedtime.   lisinopril-hydrochlorothiazide 20-25 MG tablet Commonly known as:  PRINZIDE,ZESTORETIC Take 1 tablet by mouth daily.   MUSCLE RUB 10-15 % Crea Apply 1 application topically 2 (two) times daily as needed for muscle pain.   Oxycodone HCl 10 MG Tabs Take 1 tablet by mouth 2 (two) times daily as needed (back pain).   oxyCODONE-acetaminophen 5-325 MG tablet Commonly known as:  PERCOCET/ROXICET Take 1-2 tablets by mouth every 4 (four) hours as needed for severe pain.   predniSONE 10 MG tablet Commonly known as:  DELTASONE Take 1 tablet (10 mg total) by mouth daily with breakfast. 3 tablets daily for 3 days, 2 tablets daily for 3 days, one tablet daily for 3 days.   ranitidine 150 MG  tablet Commonly known as:  ZANTAC Take 150 mg by mouth daily as needed for heartburn.   Vitamin D (Ergocalciferol) 50000 units Caps capsule Commonly known as:  DRISDOL Take one capsule weekly x 8 weeks then convert to OTC Vitamin D 2000 units daily thereafter.   Vitamin D 2000 units Caps Take 2,000 Units by mouth daily.   XARELTO 20 MG Tabs tablet Generic drug:  rivaroxaban Take 20 mg by mouth daily with supper.            Discharge Care Instructions  Start     Ordered   11/02/16 0000  Resume previous diet     11/02/16 0734   11/02/16 0000  Driving Restrictions    Comments:  No driving for 2 weeks   11/02/16 0734   11/02/16 0000  Lifting restrictions    Comments:  No lifting for 3 weeks   11/02/16 0734   11/02/16 0000  Call MD for:  temperature >100.5     11/02/16 0734   11/02/16 0000  Call MD for:  redness, tenderness, or signs of infection (pain, swelling, bleeding, redness, odor or green/yellow discharge around incision site)     11/02/16 0734   11/02/16 0000  Call MD for:  severe or increased pain, loss or decreased feeling  in affected limb(s)     11/02/16 0734   11/02/16 0000  Discharge instructions    Comments:  You may shower in 24 hours.   11/02/16 0734   11/02/16 0000  Increase activity slowly    Comments:  Walk with assistance use walker or cane as needed   11/02/16 0734     Verbal and written Discharge instructions given to the patient. Wound care per Discharge AVS   Signed: Laurence Slate [facility] 11/07/2016, 9:24 AM --- For VQI Registry use --- Instructions: Press F2 to tab through selections.  Delete question if not applicable.   Modified Rankin score at D/C (0-6): Rankin Score=0  IV medication needed for:  1. Hypertension: No 2. Hypotension: No  Post-op Complications: No  1. Post-op CVA or TIA: No  If yes: Event classification (right eye, left eye, right cortical, left cortical, verterobasilar, other):   If yes: Timing of event  (intra-op, <6 hrs post-op, >=6 hrs post-op, unknown):   2. CN injury: No  If yes: CN  injuried   3. Myocardial infarction: No  If yes: Dx by (EKG or clinical, Troponin):   4.  CHF: No  5.  Dysrhythmia (new): No  6. Wound infection: No  7. Reperfusion symptoms: No  8. Return to OR: No  If yes: return to OR for (bleeding, neurologic, other CEA incision, other):   Discharge medications: Statin use:  Yes ASA use:  No  for medical reason   Beta blocker use:  No  for medical reason   ACE-Inhibitor use:  Yes P2Y12 Antagonist use: [ ]  None, [x ] Plavix, [ ]  Plasugrel, [ ]  Ticlopinine, [ ]  Ticagrelor, [ ]  Other, [ ]  No for medical reason, [ ]  Non-compliant, [ ]  Not-indicated Anti-coagulant use:  [ ]  None, [ ]  Warfarin, [x ] Rivaroxaban, [ ]  Dabigatran, [ ]  Other, [ ]  No for medical reason, [ ]  Non-compliant, [ ]  Not-indicated

## 2016-11-08 DIAGNOSIS — R55 Syncope and collapse: Secondary | ICD-10-CM | POA: Diagnosis not present

## 2016-11-08 DIAGNOSIS — Z1389 Encounter for screening for other disorder: Secondary | ICD-10-CM | POA: Diagnosis not present

## 2016-11-08 DIAGNOSIS — I10 Essential (primary) hypertension: Secondary | ICD-10-CM | POA: Diagnosis not present

## 2016-11-08 DIAGNOSIS — M109 Gout, unspecified: Secondary | ICD-10-CM | POA: Diagnosis not present

## 2016-11-14 DIAGNOSIS — D72829 Elevated white blood cell count, unspecified: Secondary | ICD-10-CM | POA: Diagnosis not present

## 2016-11-14 DIAGNOSIS — E860 Dehydration: Secondary | ICD-10-CM | POA: Diagnosis not present

## 2016-11-15 ENCOUNTER — Encounter: Payer: Self-pay | Admitting: Vascular Surgery

## 2016-11-15 ENCOUNTER — Other Ambulatory Visit: Payer: Self-pay

## 2016-11-15 DIAGNOSIS — Z48812 Encounter for surgical aftercare following surgery on the circulatory system: Secondary | ICD-10-CM

## 2016-11-16 ENCOUNTER — Ambulatory Visit (HOSPITAL_COMMUNITY)
Admission: RE | Admit: 2016-11-16 | Discharge: 2016-11-16 | Disposition: A | Discharge status: Home / Self Care | Payer: Medicare Other | Source: Ambulatory Visit | Attending: Vascular Surgery | Admitting: Vascular Surgery

## 2016-11-16 ENCOUNTER — Ambulatory Visit (INDEPENDENT_AMBULATORY_CARE_PROVIDER_SITE_OTHER): Payer: Self-pay | Admitting: Physician Assistant

## 2016-11-16 VITALS — BP 121/59 | HR 76 | Temp 98.9°F | Resp 18 | Ht 64.0 in | Wt 223.0 lb

## 2016-11-16 DIAGNOSIS — Z48812 Encounter for surgical aftercare following surgery on the circulatory system: Secondary | ICD-10-CM | POA: Diagnosis not present

## 2016-11-16 DIAGNOSIS — I6521 Occlusion and stenosis of right carotid artery: Secondary | ICD-10-CM

## 2016-11-16 LAB — VAS US CAROTID
LEFT ECA DIAS: -15 cm/s
Left CCA dist dias: -26 cm/s
Left CCA dist sys: -88 cm/s
Left CCA prox dias: 27 cm/s
Left CCA prox sys: 110 cm/s
Left ICA dist dias: -35 cm/s
Left ICA dist sys: -123 cm/s
Left ICA prox dias: -62 cm/s
Left ICA prox sys: -173 cm/s
RIGHT CCA MID DIAS: 45 cm/s
RIGHT ECA DIAS: -19 cm/s
Right CCA prox dias: 39 cm/s
Right CCA prox sys: 157 cm/s
Right cca dist sys: -122 cm/s

## 2016-11-16 NOTE — Progress Notes (Signed)
POST OPERATIVE OFFICE NOTE    CC:  F/u for surgery  HPI:  This is a 66 y.o. female who is s/p Trans carotid artery revascularization (TCAR) right side.  She has tolerated the surgery without complaints.  Hx:  She apparently had some dizziness as well and states she may have even had 2 syncopal events. Carotid duplex in July 2018 showed 70-99% right carotid stenosis and 50-70% left carotid stenosis. On CT Angio was thought that her stenosis was a high lesion that may be difficult to access from a transcervical approach. The patient currently is on Xarelto for prior DVT and pulmonary embolus. 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 Plavix was recently added to this prior to her TCAR surgery.  Allergies  Allergen Reactions  . Bee Venom Anaphylaxis    Current Outpatient Prescriptions  Medication Sig Dispense Refill  . atorvastatin (LIPITOR) 10 MG tablet Take 1 tablet (10 mg total) by mouth daily. 30 tablet 6  . Cholecalciferol (VITAMIN D) 2000 units CAPS Take 2,000 Units by mouth daily.    . clopidogrel (PLAVIX) 75 MG tablet Take 1 tablet (75 mg total) by mouth daily. 30 tablet 11  . diazepam (VALIUM) 2 MG tablet Take 2 mg by mouth daily as needed for anxiety.     . DULoxetine (CYMBALTA) 30 MG capsule Take 30 mg by mouth daily.    Marland Kitchen gabapentin (NEURONTIN) 600 MG tablet Take 300-600 mg by mouth See admin instructions. Take 300 mg twice daily IN THE MORNING AND AT NOON AND  600 mg at bedtime.    . Menthol-Methyl Salicylate (MUSCLE RUB) 10-15 % CREA Apply 1 application topically 2 (two) times daily as needed for muscle pain.    Marland Kitchen oxyCODONE-acetaminophen (PERCOCET/ROXICET) 5-325 MG tablet Take 1-2 tablets by mouth every 4 (four) hours as needed for severe pain. 12 tablet 0  . ranitidine (ZANTAC) 150 MG tablet Take 150 mg by mouth daily as needed for heartburn.    . rivaroxaban (XARELTO) 20 MG TABS tablet Take 20 mg by mouth daily with  supper.    . Vitamin D, Ergocalciferol, (DRISDOL) 50000 units CAPS capsule Take one capsule weekly x 8 weeks then convert to OTC Vitamin D 2000 units daily thereafter. 8 capsule 0  . amLODipine (NORVASC) 5 MG tablet Take 5 mg by mouth daily.    Marland Kitchen lisinopril-hydrochlorothiazide (PRINZIDE,ZESTORETIC) 20-25 MG per tablet Take 1 tablet by mouth daily.      . Oxycodone HCl 10 MG TABS Take 1 tablet by mouth 2 (two) times daily as needed (back pain).     . predniSONE (DELTASONE) 10 MG tablet Take 1 tablet (10 mg total) by mouth daily with breakfast. 3 tablets daily for 3 days, 2 tablets daily for 3 days, one tablet daily for 3 days. (Patient not taking: Reported on 11/16/2016) 18 tablet 0   No current facility-administered medications for this visit.      ROS:  See HPI  Physical Exam:  Vitals:   11/16/16 1557 11/16/16 1558  BP: 136/71 (!) 121/59  Pulse: 76   Resp: 18   Temp: 98.9 F (37.2 C)   SpO2: 100%     Incision:  Well healing Extremities:  Grip 5/5 B, palpable radial arteries equal B 2+, No hoarseness to voice Neuro: no deficits  Study Carotid duplex Right Peak systolic velocity ICA 11-94%, diastolic velocities are consistent with 1-39% stenosis  Left Systolic velocity 174 and diastolic 66  suggested 60-79% stenosis  Assessment/Plan:  This is a 66 y.o. female who is s/p: Right TCAR  She has tolerated this surgery well without complaints.  Since surgery she has been taken off her antihypertensive medications.  Her BP today was 123/47.  She has a follow up with her cardiologist schedule.    She will follow up for repeat carotid duplex in 6 months.   Laurence Slate [facility] PA-C [facility] [phone]  Clinic MD:  Oneida Alar

## 2016-11-17 NOTE — Addendum Note (Signed)
Addended by: Lianne Cure A on: 11/17/2016 10:42 AM   Modules accepted: Orders

## 2016-12-01 DIAGNOSIS — Z23 Encounter for immunization: Secondary | ICD-10-CM | POA: Diagnosis not present

## 2016-12-01 DIAGNOSIS — Z719 Counseling, unspecified: Secondary | ICD-10-CM | POA: Diagnosis not present

## 2016-12-01 DIAGNOSIS — I708 Atherosclerosis of other arteries: Secondary | ICD-10-CM | POA: Diagnosis not present

## 2016-12-01 DIAGNOSIS — M545 Low back pain: Secondary | ICD-10-CM | POA: Diagnosis not present

## 2016-12-19 DIAGNOSIS — I10 Essential (primary) hypertension: Secondary | ICD-10-CM | POA: Diagnosis not present

## 2016-12-19 DIAGNOSIS — G894 Chronic pain syndrome: Secondary | ICD-10-CM | POA: Diagnosis not present

## 2016-12-19 DIAGNOSIS — M1000 Idiopathic gout, unspecified site: Secondary | ICD-10-CM | POA: Diagnosis not present

## 2016-12-19 DIAGNOSIS — L723 Sebaceous cyst: Secondary | ICD-10-CM | POA: Diagnosis not present

## 2017-01-23 ENCOUNTER — Ambulatory Visit (INDEPENDENT_AMBULATORY_CARE_PROVIDER_SITE_OTHER): Payer: Medicare Other | Admitting: General Surgery

## 2017-01-23 ENCOUNTER — Encounter: Payer: Self-pay | Admitting: General Surgery

## 2017-01-23 VITALS — BP 118/53 | HR 87 | Temp 98.2°F | Resp 18 | Ht 64.0 in | Wt 219.0 lb

## 2017-01-23 DIAGNOSIS — L723 Sebaceous cyst: Secondary | ICD-10-CM

## 2017-01-23 NOTE — Progress Notes (Signed)
Jennifer [name]; [id number]; [birth year]   HPI Patient is a 66 year old white female who was referred to my care by Dr. Gerarda Fraction for evaluation and treatment of a recurrent sebaceous cyst on her abdominal wall.  She states she has had multiple episodes of inflammation of cysts on her abdominal wall in the past.  This has been chronic in nature.  She currently has no pain.  She was treated with an antibiotic and the cyst resolved.  She did not have much purulent drainage present.  She currently has 0 out of 10 pain.  Her history is remarkable for recent carotid artery stent placement in August of this year and is currently on Xarelto. Past Medical History:  Diagnosis Date  . Anxiety   . Arthritis   . CAD (coronary artery disease)    a. Cath 12/2014 - mild-moderate non-obstructive CAD with 60% mid RCA, 50% mid Cx-distal Cx, 30% distal LAD, 25% D2, EF 55-65%.  . Depression   . Dizziness   . DVT (deep venous thrombosis) ([place])   . Dyspnea    W/ EXERTION  . GERD (gastroesophageal reflux disease)   . Headache   . Heart murmur    DX   AT BIRTH  . Hyperlipidemia   . Hypertension   . 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 Neuropathy   . PE (pulmonary embolism)   . Tobacco abuse     Past Surgical History:  Procedure Laterality Date  . ABDOMINAL HYSTERECTOMY    . CARPAL TUNNEL RELEASE     RIGHT  . KNEE ARTHROSCOPY W/ MENISCAL REPAIR     LEFT KNEE   FORMED DVT (SURG TO REMOVE BLOOD CLOT)  . TONSILLECTOMY      Family History  Problem Relation Age of Onset  . Heart attack Mother   . Heart attack Father   . Heart attack Brother        CABG  . Heart attack Brother        CABG  . Heart attack Brother        CABG  . Ovarian cancer Maternal Grandmother   . Heart disease Maternal Grandfather   . Dementia Paternal Grandmother     Current Outpatient Medications on File Prior to Visit  Medication Sig Dispense Refill  . amLODipine (NORVASC) 5 MG tablet Take 5 mg by mouth daily.    Marland Kitchen  atorvastatin (LIPITOR) 10 MG tablet Take 1 tablet (10 mg total) by mouth daily. 30 tablet 6  . Cholecalciferol (VITAMIN D) 2000 units CAPS Take 2,000 Units by mouth daily.    . clopidogrel (PLAVIX) 75 MG tablet Take 1 tablet (75 mg total) by mouth daily. 30 tablet 11  . diazepam (VALIUM) 2 MG tablet Take 2 mg by mouth daily as needed for anxiety.     . DULoxetine (CYMBALTA) 30 MG capsule Take 30 mg by mouth daily.    Marland Kitchen gabapentin (NEURONTIN) 600 MG tablet Take 300-600 mg by mouth See admin instructions. Take 300 mg twice daily IN THE MORNING AND AT NOON AND  600 mg at bedtime.    Marland Kitchen lisinopril-hydrochlorothiazide (PRINZIDE,ZESTORETIC) 20-25 MG per tablet Take 1 tablet by mouth daily.      . Menthol-Methyl Salicylate (MUSCLE RUB) 10-15 % CREA Apply 1 application topically 2 (two) times daily as needed for muscle pain.    . Oxycodone HCl 10 MG TABS Take 1 tablet by mouth 2 (two) times daily as needed (back pain).     Marland Kitchen  oxyCODONE-acetaminophen (PERCOCET/ROXICET) 5-325 MG tablet Take 1-2 tablets by mouth every 4 (four) hours as needed for severe pain. 12 tablet 0  . predniSONE (DELTASONE) 10 MG tablet Take 1 tablet (10 mg total) by mouth daily with breakfast. 3 tablets daily for 3 days, 2 tablets daily for 3 days, one tablet daily for 3 days. (Patient not taking: Reported on 11/16/2016) 18 tablet 0  . ranitidine (ZANTAC) 150 MG tablet Take 150 mg by mouth daily as needed for heartburn.    . rivaroxaban (XARELTO) 20 MG TABS tablet Take 20 mg by mouth daily with supper.    . Vitamin D, Ergocalciferol, (DRISDOL) 50000 units CAPS capsule Take one capsule weekly x 8 weeks then convert to OTC Vitamin D 2000 units daily thereafter. 8 capsule 0   No current facility-administered medications on file prior to visit.     Allergies  Allergen Reactions  . Bee Venom Anaphylaxis    Social History   Substance and Sexual Activity  Alcohol Use No  . Alcohol/week: 0.0 oz    Social History   Tobacco Use   Smoking Status Current Every Day Smoker  . Packs/day: 1.00  . Types: Cigarettes  . Start date: 06/18/1967  Smokeless Tobacco Never Used  Tobacco Comment   Down to 1/2 pk per day.     Review of Systems  Constitutional: Positive for malaise/fatigue.  HENT: Negative.   Eyes: Negative.   Respiratory: Negative.   Cardiovascular: Negative.   Gastrointestinal: Negative.   Genitourinary: Negative.   Musculoskeletal: Positive for back pain.  Skin:       boils  Neurological: Negative.   Endo/Heme/Allergies: Bruises/bleeds easily.  Psychiatric/Behavioral: Negative.     Objective   Vitals:   01/23/17 0948  BP: (!) 118/53  Pulse: 87  Resp: 18  Temp: 98.2 F (36.8 C)    Physical Exam  Constitutional: She is oriented to person, place, and time and well-developed, well-nourished, and in no distress.  HENT:  Head: Normocephalic and atraumatic.  Cardiovascular: Normal rate, regular rhythm and normal heart sounds. Exam reveals no gallop and no friction rub.  No murmur heard. Pulmonary/Chest: Effort normal and breath sounds normal. No respiratory distress. She has no wheezes. She has no rales.  Abdominal: Soft. Bowel sounds are normal. She exhibits no distension. There is no tenderness. There is no rebound.  Neurological: She is alert and oriented to person, place, and time.  Skin:  Multiple puncture wounds with scarring of abdominal wall along her pannus.  The area of concern has no induration or erythema.  No sebum could be expressed.  Vitals reviewed.   Assessment  Sebaceous cyst, pannus, resolved. Plan   Given her recent history of carotid artery stent placement and need for chronic anticoagulation, I do not recommend elective excision at this time.  Patient understands and agrees.  I told her I be more than happy to see her again in the future.  Would treat conservatively as needed.  Follow-up here as needed.

## 2017-01-23 NOTE — Patient Instructions (Signed)
Epidermal Cyst An epidermal cyst is a small, painless lump under your skin. It may be called an epidermal inclusion cyst or an infundibular cyst. The cyst contains a grayish-white, bad-smelling substance (keratin). It is important not to pop epidermal cysts yourself. These cysts are usually harmless (benign), but they can get infected. Symptoms of infection may include:  Redness.  Inflammation.  Tenderness.  Warmth.  Fever.  A grayish-white, bad-smelling substance draining from the cyst.  Pus draining from the cyst.  Follow these instructions at home:  Take over-the-counter and prescription medicines only as told by your doctor.  If you were prescribed an antibiotic, use it as told by your doctor. Do not stop using the antibiotic even if you start to feel better.  Keep the area around your cyst clean and dry.  Wear loose, dry clothing.  Do not try to pop your cyst.  Avoid touching your cyst.  Check your cyst every day for signs of infection.  Keep all follow-up visits as told by your doctor. This is important. How is this prevented?  Wear clean, dry, clothing.  Avoid wearing tight clothing.  Keep your skin clean and dry. Shower or take baths every day.  Wash your body with a benzoyl peroxide wash when you shower or bathe. Contact a health care provider if:  Your cyst has symptoms of infection.  Your condition is not improving or is getting worse.  You have a cyst that looks different from other cysts you have had.  You have a fever. Get help right away if:  Redness spreads from the cyst into the surrounding area. This information is not intended to replace advice given to you by your health care provider. Make sure you discuss any questions you have with your health care provider. Document Released: 04/06/2004 Document Revised: 10/27/2015 Document Reviewed: 12/30/2014 Elsevier Interactive Patient Education  2018 [employer]  

## 2017-02-12 ENCOUNTER — Encounter (HOSPITAL_COMMUNITY): Payer: Self-pay | Admitting: Emergency Medicine

## 2017-02-12 DIAGNOSIS — Q600 Renal agenesis, unilateral: Secondary | ICD-10-CM | POA: Diagnosis not present

## 2017-02-12 DIAGNOSIS — R5383 Other fatigue: Secondary | ICD-10-CM

## 2017-02-12 DIAGNOSIS — I214 Non-ST elevation (NSTEMI) myocardial infarction: Secondary | ICD-10-CM | POA: Diagnosis not present

## 2017-02-12 DIAGNOSIS — R1110 Vomiting, unspecified: Secondary | ICD-10-CM | POA: Insufficient documentation

## 2017-02-12 DIAGNOSIS — I2510 Atherosclerotic heart disease of native coronary artery without angina pectoris: Secondary | ICD-10-CM | POA: Diagnosis not present

## 2017-02-12 DIAGNOSIS — E785 Hyperlipidemia, unspecified: Secondary | ICD-10-CM | POA: Diagnosis not present

## 2017-02-12 DIAGNOSIS — Z86718 Personal history of other venous thrombosis and embolism: Secondary | ICD-10-CM | POA: Diagnosis not present

## 2017-02-12 DIAGNOSIS — Z7901 Long term (current) use of anticoagulants: Secondary | ICD-10-CM | POA: Diagnosis not present

## 2017-02-12 DIAGNOSIS — Z79899 Other long term (current) drug therapy: Secondary | ICD-10-CM | POA: Diagnosis not present

## 2017-02-12 DIAGNOSIS — R197 Diarrhea, unspecified: Secondary | ICD-10-CM | POA: Insufficient documentation

## 2017-02-12 DIAGNOSIS — Z955 Presence of coronary angioplasty implant and graft: Secondary | ICD-10-CM | POA: Diagnosis not present

## 2017-02-12 DIAGNOSIS — M791 Myalgia, unspecified site: Secondary | ICD-10-CM

## 2017-02-12 DIAGNOSIS — Z86711 Personal history of pulmonary embolism: Secondary | ICD-10-CM | POA: Diagnosis not present

## 2017-02-12 DIAGNOSIS — D509 Iron deficiency anemia, unspecified: Secondary | ICD-10-CM | POA: Diagnosis not present

## 2017-02-12 DIAGNOSIS — K219 Gastro-esophageal reflux disease without esophagitis: Secondary | ICD-10-CM | POA: Diagnosis not present

## 2017-02-12 DIAGNOSIS — I10 Essential (primary) hypertension: Secondary | ICD-10-CM | POA: Diagnosis not present

## 2017-02-12 DIAGNOSIS — Z91030 Bee allergy status: Secondary | ICD-10-CM | POA: Diagnosis not present

## 2017-02-12 DIAGNOSIS — Z905 Acquired absence of kidney: Secondary | ICD-10-CM | POA: Diagnosis not present

## 2017-02-12 DIAGNOSIS — D125 Benign neoplasm of sigmoid colon: Secondary | ICD-10-CM | POA: Diagnosis not present

## 2017-02-12 DIAGNOSIS — R1111 Vomiting without nausea: Secondary | ICD-10-CM | POA: Diagnosis not present

## 2017-02-12 DIAGNOSIS — Z5321 Procedure and treatment not carried out due to patient leaving prior to being seen by health care provider: Secondary | ICD-10-CM | POA: Insufficient documentation

## 2017-02-12 DIAGNOSIS — Z8249 Family history of ischemic heart disease and other diseases of the circulatory system: Secondary | ICD-10-CM | POA: Diagnosis not present

## 2017-02-12 DIAGNOSIS — E8770 Fluid overload, unspecified: Secondary | ICD-10-CM | POA: Diagnosis not present

## 2017-02-12 DIAGNOSIS — N179 Acute kidney failure, unspecified: Secondary | ICD-10-CM | POA: Diagnosis not present

## 2017-02-12 DIAGNOSIS — G629 Polyneuropathy, unspecified: Secondary | ICD-10-CM | POA: Diagnosis not present

## 2017-02-12 DIAGNOSIS — D62 Acute posthemorrhagic anemia: Secondary | ICD-10-CM | POA: Diagnosis not present

## 2017-02-12 LAB — CBC WITH DIFFERENTIAL/PLATELET
Basophils Absolute: 0 10*3/uL (ref 0.0–0.1)
Basophils Relative: 0 %
Eosinophils Absolute: 0 10*3/uL (ref 0.0–0.7)
Eosinophils Relative: 0 %
HCT: 28 % — ABNORMAL LOW (ref 36.0–46.0)
Hemoglobin: 7.2 g/dL — ABNORMAL LOW (ref 12.0–15.0)
Lymphocytes Relative: 17 %
Lymphs Abs: 1.5 10*3/uL (ref 0.7–4.0)
MCH: 17.6 pg — ABNORMAL LOW (ref 26.0–34.0)
MCHC: 25.7 g/dL — ABNORMAL LOW (ref 30.0–36.0)
MCV: 68.6 fL — ABNORMAL LOW (ref 78.0–100.0)
Monocytes Absolute: 0.5 10*3/uL (ref 0.1–1.0)
Monocytes Relative: 6 %
Neutro Abs: 7.1 10*3/uL (ref 1.7–7.7)
Neutrophils Relative %: 77 %
Platelets: 372 10*3/uL (ref 150–400)
RBC: 4.08 MIL/uL (ref 3.87–5.11)
RDW: 20.2 % — ABNORMAL HIGH (ref 11.5–15.5)
WBC: 9.1 10*3/uL (ref 4.0–10.5)

## 2017-02-12 LAB — COMPREHENSIVE METABOLIC PANEL
ALT: 12 U/L — ABNORMAL LOW (ref 14–54)
AST: 48 U/L — ABNORMAL HIGH (ref 15–41)
Albumin: 3.8 g/dL (ref 3.5–5.0)
Alkaline Phosphatase: 76 U/L (ref 38–126)
Anion gap: 13 (ref 5–15)
BUN: 5 mg/dL — ABNORMAL LOW (ref 6–20)
CO2: 24 mmol/L (ref 22–32)
Calcium: 10 mg/dL (ref 8.9–10.3)
Chloride: 102 mmol/L (ref 101–111)
Creatinine, Ser: 1.12 mg/dL — ABNORMAL HIGH (ref 0.44–1.00)
GFR calc Af Amer: 58 mL/min — ABNORMAL LOW (ref >60)
GFR calc non Af Amer: 50 mL/min — ABNORMAL LOW (ref >60)
Glucose, Bld: 107 mg/dL — ABNORMAL HIGH (ref 65–99)
Potassium: 3.4 mmol/L — ABNORMAL LOW (ref 3.5–5.1)
Sodium: 139 mmol/L (ref 135–145)
Total Bilirubin: 0.4 mg/dL (ref 0.3–1.2)
Total Protein: 6.4 g/dL — ABNORMAL LOW (ref 6.5–8.1)

## 2017-02-12 NOTE — ED Triage Notes (Signed)
Patient reports emesis , diarrhea , fatigue , poor appetite and generalized body aches onset this week .

## 2017-02-13 ENCOUNTER — Emergency Department (HOSPITAL_COMMUNITY): Payer: Medicare Other

## 2017-02-13 ENCOUNTER — Inpatient Hospital Stay (HOSPITAL_COMMUNITY): Payer: Medicare Other

## 2017-02-13 ENCOUNTER — Encounter (HOSPITAL_COMMUNITY): Payer: Self-pay | Admitting: Emergency Medicine

## 2017-02-13 ENCOUNTER — Emergency Department (HOSPITAL_COMMUNITY)
Admission: EM | Admit: 2017-02-13 | Discharge: 2017-02-13 | Disposition: A | Discharge status: Home / Self Care | Payer: Medicare Other | Source: Home / Self Care

## 2017-02-13 ENCOUNTER — Inpatient Hospital Stay (HOSPITAL_COMMUNITY)
Admission: EM | Admit: 2017-02-13 | Discharge: 2017-02-27 | DRG: 234 | Disposition: A | Discharge status: Home / Self Care | Payer: Medicare Other | Attending: Thoracic Surgery (Cardiothoracic Vascular Surgery) | Admitting: Thoracic Surgery (Cardiothoracic Vascular Surgery)

## 2017-02-13 ENCOUNTER — Other Ambulatory Visit: Payer: Self-pay

## 2017-02-13 DIAGNOSIS — K921 Melena: Secondary | ICD-10-CM

## 2017-02-13 DIAGNOSIS — F17200 Nicotine dependence, unspecified, uncomplicated: Secondary | ICD-10-CM | POA: Diagnosis present

## 2017-02-13 DIAGNOSIS — I2510 Atherosclerotic heart disease of native coronary artery without angina pectoris: Secondary | ICD-10-CM | POA: Diagnosis not present

## 2017-02-13 DIAGNOSIS — I10 Essential (primary) hypertension: Secondary | ICD-10-CM | POA: Diagnosis not present

## 2017-02-13 DIAGNOSIS — R7989 Other specified abnormal findings of blood chemistry: Secondary | ICD-10-CM

## 2017-02-13 DIAGNOSIS — Z79899 Other long term (current) drug therapy: Secondary | ICD-10-CM

## 2017-02-13 DIAGNOSIS — E8770 Fluid overload, unspecified: Secondary | ICD-10-CM | POA: Diagnosis not present

## 2017-02-13 DIAGNOSIS — R0789 Other chest pain: Secondary | ICD-10-CM | POA: Diagnosis not present

## 2017-02-13 DIAGNOSIS — E669 Obesity, unspecified: Secondary | ICD-10-CM | POA: Diagnosis present

## 2017-02-13 DIAGNOSIS — R112 Nausea with vomiting, unspecified: Secondary | ICD-10-CM | POA: Diagnosis not present

## 2017-02-13 DIAGNOSIS — D649 Anemia, unspecified: Secondary | ICD-10-CM | POA: Diagnosis not present

## 2017-02-13 DIAGNOSIS — Z7901 Long term (current) use of anticoagulants: Secondary | ICD-10-CM

## 2017-02-13 DIAGNOSIS — R1110 Vomiting, unspecified: Secondary | ICD-10-CM | POA: Diagnosis not present

## 2017-02-13 DIAGNOSIS — K920 Hematemesis: Secondary | ICD-10-CM | POA: Diagnosis not present

## 2017-02-13 DIAGNOSIS — D122 Benign neoplasm of ascending colon: Secondary | ICD-10-CM | POA: Diagnosis not present

## 2017-02-13 DIAGNOSIS — R0602 Shortness of breath: Secondary | ICD-10-CM | POA: Diagnosis not present

## 2017-02-13 DIAGNOSIS — Z90710 Acquired absence of both cervix and uterus: Secondary | ICD-10-CM

## 2017-02-13 DIAGNOSIS — D509 Iron deficiency anemia, unspecified: Secondary | ICD-10-CM | POA: Diagnosis not present

## 2017-02-13 DIAGNOSIS — Z6837 Body mass index (BMI) 37.0-37.9, adult: Secondary | ICD-10-CM | POA: Diagnosis not present

## 2017-02-13 DIAGNOSIS — J9811 Atelectasis: Secondary | ICD-10-CM | POA: Diagnosis not present

## 2017-02-13 DIAGNOSIS — N179 Acute kidney failure, unspecified: Secondary | ICD-10-CM | POA: Diagnosis not present

## 2017-02-13 DIAGNOSIS — Z955 Presence of coronary angioplasty implant and graft: Secondary | ICD-10-CM | POA: Diagnosis not present

## 2017-02-13 DIAGNOSIS — F17210 Nicotine dependence, cigarettes, uncomplicated: Secondary | ICD-10-CM | POA: Diagnosis present

## 2017-02-13 DIAGNOSIS — Z86711 Personal history of pulmonary embolism: Secondary | ICD-10-CM

## 2017-02-13 DIAGNOSIS — R079 Chest pain, unspecified: Secondary | ICD-10-CM | POA: Diagnosis not present

## 2017-02-13 DIAGNOSIS — Z8249 Family history of ischemic heart disease and other diseases of the circulatory system: Secondary | ICD-10-CM

## 2017-02-13 DIAGNOSIS — K219 Gastro-esophageal reflux disease without esophagitis: Secondary | ICD-10-CM | POA: Diagnosis present

## 2017-02-13 DIAGNOSIS — Z86718 Personal history of other venous thrombosis and embolism: Secondary | ICD-10-CM

## 2017-02-13 DIAGNOSIS — Z91030 Bee allergy status: Secondary | ICD-10-CM | POA: Diagnosis not present

## 2017-02-13 DIAGNOSIS — Z09 Encounter for follow-up examination after completed treatment for conditions other than malignant neoplasm: Secondary | ICD-10-CM

## 2017-02-13 DIAGNOSIS — Q600 Renal agenesis, unilateral: Secondary | ICD-10-CM | POA: Diagnosis not present

## 2017-02-13 DIAGNOSIS — R748 Abnormal levels of other serum enzymes: Secondary | ICD-10-CM

## 2017-02-13 DIAGNOSIS — Z951 Presence of aortocoronary bypass graft: Secondary | ICD-10-CM

## 2017-02-13 DIAGNOSIS — I214 Non-ST elevation (NSTEMI) myocardial infarction: Secondary | ICD-10-CM | POA: Diagnosis not present

## 2017-02-13 DIAGNOSIS — E782 Mixed hyperlipidemia: Secondary | ICD-10-CM | POA: Diagnosis not present

## 2017-02-13 DIAGNOSIS — Z01810 Encounter for preprocedural cardiovascular examination: Secondary | ICD-10-CM | POA: Diagnosis not present

## 2017-02-13 DIAGNOSIS — D125 Benign neoplasm of sigmoid colon: Secondary | ICD-10-CM | POA: Diagnosis present

## 2017-02-13 DIAGNOSIS — R197 Diarrhea, unspecified: Secondary | ICD-10-CM

## 2017-02-13 DIAGNOSIS — R195 Other fecal abnormalities: Secondary | ICD-10-CM | POA: Diagnosis not present

## 2017-02-13 DIAGNOSIS — D62 Acute posthemorrhagic anemia: Secondary | ICD-10-CM | POA: Diagnosis present

## 2017-02-13 DIAGNOSIS — R778 Other specified abnormalities of plasma proteins: Secondary | ICD-10-CM

## 2017-02-13 DIAGNOSIS — R1111 Vomiting without nausea: Secondary | ICD-10-CM | POA: Diagnosis not present

## 2017-02-13 DIAGNOSIS — K635 Polyp of colon: Secondary | ICD-10-CM | POA: Diagnosis not present

## 2017-02-13 DIAGNOSIS — G629 Polyneuropathy, unspecified: Secondary | ICD-10-CM | POA: Diagnosis present

## 2017-02-13 DIAGNOSIS — Z905 Acquired absence of kidney: Secondary | ICD-10-CM

## 2017-02-13 DIAGNOSIS — E785 Hyperlipidemia, unspecified: Secondary | ICD-10-CM | POA: Diagnosis present

## 2017-02-13 DIAGNOSIS — R0989 Other specified symptoms and signs involving the circulatory and respiratory systems: Secondary | ICD-10-CM | POA: Diagnosis not present

## 2017-02-13 DIAGNOSIS — R0609 Other forms of dyspnea: Secondary | ICD-10-CM | POA: Diagnosis not present

## 2017-02-13 DIAGNOSIS — J90 Pleural effusion, not elsewhere classified: Secondary | ICD-10-CM | POA: Diagnosis not present

## 2017-02-13 DIAGNOSIS — I340 Nonrheumatic mitral (valve) insufficiency: Secondary | ICD-10-CM | POA: Diagnosis not present

## 2017-02-13 DIAGNOSIS — Z4682 Encounter for fitting and adjustment of non-vascular catheter: Secondary | ICD-10-CM | POA: Diagnosis not present

## 2017-02-13 DIAGNOSIS — I25110 Atherosclerotic heart disease of native coronary artery with unstable angina pectoris: Secondary | ICD-10-CM | POA: Diagnosis not present

## 2017-02-13 LAB — COMPREHENSIVE METABOLIC PANEL
ALT: 16 U/L (ref 14–54)
AST: 71 U/L — ABNORMAL HIGH (ref 15–41)
Albumin: 3.8 g/dL (ref 3.5–5.0)
Alkaline Phosphatase: 72 U/L (ref 38–126)
Anion gap: 10 (ref 5–15)
BUN: 10 mg/dL (ref 6–20)
CO2: 25 mmol/L (ref 22–32)
Calcium: 9 mg/dL (ref 8.9–10.3)
Chloride: 102 mmol/L (ref 101–111)
Creatinine, Ser: 1.02 mg/dL — ABNORMAL HIGH (ref 0.44–1.00)
GFR calc Af Amer: >60 mL/min (ref >60)
GFR calc non Af Amer: 56 mL/min — ABNORMAL LOW (ref >60)
Glucose, Bld: 99 mg/dL (ref 65–99)
Potassium: 3.3 mmol/L — ABNORMAL LOW (ref 3.5–5.1)
Sodium: 137 mmol/L (ref 135–145)
Total Bilirubin: 0.6 mg/dL (ref 0.3–1.2)
Total Protein: 6.5 g/dL (ref 6.5–8.1)

## 2017-02-13 LAB — CBC
HCT: 27.1 % — ABNORMAL LOW (ref 36.0–46.0)
Hemoglobin: 7 g/dL — ABNORMAL LOW (ref 12.0–15.0)
MCH: 18 pg — ABNORMAL LOW (ref 26.0–34.0)
MCHC: 25.8 g/dL — ABNORMAL LOW (ref 30.0–36.0)
MCV: 69.8 fL — ABNORMAL LOW (ref 78.0–100.0)
Platelets: 374 10*3/uL (ref 150–400)
RBC: 3.88 MIL/uL (ref 3.87–5.11)
RDW: 20.3 % — ABNORMAL HIGH (ref 11.5–15.5)
WBC: 11.2 10*3/uL — ABNORMAL HIGH (ref 4.0–10.5)

## 2017-02-13 LAB — POC OCCULT BLOOD, ED: Fecal Occult Bld: NEGATIVE

## 2017-02-13 LAB — DIFFERENTIAL
Basophils Absolute: 0 10*3/uL (ref 0.0–0.1)
Basophils Relative: 0 %
Eosinophils Absolute: 0.1 10*3/uL (ref 0.0–0.7)
Eosinophils Relative: 1 %
Lymphocytes Relative: 14 %
Lymphs Abs: 1.5 10*3/uL (ref 0.7–4.0)
Monocytes Absolute: 0.8 10*3/uL (ref 0.1–1.0)
Monocytes Relative: 7 %
Neutro Abs: 8.4 10*3/uL — ABNORMAL HIGH (ref 1.7–7.7)
Neutrophils Relative %: 78 %

## 2017-02-13 LAB — ABO/RH: ABO/RH(D): A POS

## 2017-02-13 LAB — TROPONIN I
Troponin I: 9.96 ng/mL (ref <0.03)
Troponin I: 8.93 ng/mL (ref <0.03)
Troponin I: 11.36 ng/mL (ref <0.03)

## 2017-02-13 LAB — PREPARE RBC (CROSSMATCH)

## 2017-02-13 LAB — LIPASE, BLOOD: Lipase: 21 U/L (ref 11–51)

## 2017-02-13 MED ORDER — ATORVASTATIN CALCIUM 40 MG PO TABS
40.0000 mg | ORAL_TABLET | Freq: Every day | ORAL | Status: DC
  Administered 2017-02-14 – 2017-02-26 (×12): 40 mg via ORAL
  Filled 2017-02-13 (×13): qty 1

## 2017-02-13 MED ORDER — SODIUM CHLORIDE 0.9% FLUSH: 3.0000 mL | INTRAVENOUS | Status: DC | PRN

## 2017-02-13 MED ORDER — FAMOTIDINE 20 MG PO TABS
20.0000 mg | ORAL_TABLET | Freq: Every day | ORAL | Status: DC | PRN
Start: 2017-02-13 — End: 2017-02-16

## 2017-02-13 MED ORDER — DULOXETINE HCL 30 MG PO CPEP
30.0000 mg | ORAL_CAPSULE | Freq: Every day | ORAL | Status: DC
  Administered 2017-02-14 – 2017-02-20 (×7): 30 mg via ORAL
  Filled 2017-02-13 (×7): qty 1

## 2017-02-13 MED ORDER — IOPAMIDOL (ISOVUE-300) INJECTION 61%
100.0000 mL | Freq: Once | INTRAVENOUS | Status: AC | PRN
  Administered 2017-02-13: 100 mL via INTRAVENOUS

## 2017-02-13 MED ORDER — ACETAMINOPHEN 325 MG PO TABS
650.0000 mg | ORAL_TABLET | ORAL | Status: DC | PRN
  Administered 2017-02-17: 650 mg via ORAL
  Filled 2017-02-13: qty 2

## 2017-02-13 MED ORDER — GABAPENTIN 300 MG PO CAPS
600.0000 mg | ORAL_CAPSULE | Freq: Every day | ORAL | Status: DC
  Administered 2017-02-13 – 2017-02-20 (×8): 600 mg via ORAL
  Filled 2017-02-13 (×8): qty 2

## 2017-02-13 MED ORDER — SODIUM CHLORIDE 0.9 % IV SOLN
Freq: Once | INTRAVENOUS | Status: AC
  Administered 2017-02-13: 22:00:00 via INTRAVENOUS

## 2017-02-13 MED ORDER — NITROGLYCERIN 0.4 MG SL SUBL: 0.4000 mg | SUBLINGUAL_TABLET | SUBLINGUAL | Status: DC | PRN

## 2017-02-13 MED ORDER — SODIUM CHLORIDE 0.9 % IV SOLN
250.0000 mL | INTRAVENOUS | Status: DC | PRN
  Administered 2017-02-16: 250 mL via INTRAVENOUS

## 2017-02-13 MED ORDER — ONDANSETRON HCL 4 MG/2ML IJ SOLN: 4.0000 mg | Freq: Four times a day (QID) | INTRAMUSCULAR | Status: DC | PRN

## 2017-02-13 MED ORDER — ALLOPURINOL 300 MG PO TABS
300.0000 mg | ORAL_TABLET | Freq: Every day | ORAL | Status: DC
  Administered 2017-02-14 – 2017-02-20 (×7): 300 mg via ORAL
  Filled 2017-02-13 (×7): qty 1

## 2017-02-13 MED ORDER — GABAPENTIN 600 MG PO TABS: 300.0000 mg | ORAL_TABLET | ORAL | Status: DC

## 2017-02-13 MED ORDER — ASPIRIN EC 81 MG PO TBEC
81.0000 mg | DELAYED_RELEASE_TABLET | Freq: Every day | ORAL | Status: DC
  Administered 2017-02-14 – 2017-02-20 (×7): 81 mg via ORAL
  Filled 2017-02-13 (×7): qty 1

## 2017-02-13 MED ORDER — SODIUM CHLORIDE 0.9% FLUSH
3.0000 mL | Freq: Two times a day (BID) | INTRAVENOUS | Status: DC
  Administered 2017-02-14 – 2017-02-18 (×10): 3 mL via INTRAVENOUS

## 2017-02-13 MED ORDER — PANTOPRAZOLE SODIUM 40 MG IV SOLR
40.0000 mg | Freq: Two times a day (BID) | INTRAVENOUS | Status: DC
  Administered 2017-02-13 – 2017-02-15 (×5): 40 mg via INTRAVENOUS
  Filled 2017-02-13 (×5): qty 40

## 2017-02-13 MED ORDER — FAMOTIDINE IN NACL 20-0.9 MG/50ML-% IV SOLN
20.0000 mg | Freq: Once | INTRAVENOUS | Status: AC
  Administered 2017-02-13: 20 mg via INTRAVENOUS
  Filled 2017-02-13: qty 50

## 2017-02-13 MED ORDER — IOPAMIDOL (ISOVUE-370) INJECTION 76%
100.0000 mL | Freq: Once | INTRAVENOUS | Status: AC | PRN
  Administered 2017-02-13: 100 mL via INTRAVENOUS

## 2017-02-13 MED ORDER — SODIUM CHLORIDE 0.9 % IV SOLN
INTRAVENOUS | Status: DC
  Administered 2017-02-13: 15:00:00 via INTRAVENOUS

## 2017-02-13 MED ORDER — OXYCODONE HCL 5 MG PO TABS
10.0000 mg | ORAL_TABLET | Freq: Three times a day (TID) | ORAL | Status: DC | PRN
  Administered 2017-02-13 – 2017-02-20 (×13): 10 mg via ORAL
  Filled 2017-02-13 (×13): qty 2

## 2017-02-13 MED ORDER — GABAPENTIN 300 MG PO CAPS
300.0000 mg | ORAL_CAPSULE | Freq: Two times a day (BID) | ORAL | Status: DC
  Administered 2017-02-14 – 2017-02-20 (×13): 300 mg via ORAL
  Filled 2017-02-13 (×13): qty 1

## 2017-02-13 MED ORDER — SODIUM CHLORIDE 0.9 % IV SOLN
10.0000 mL/h | Freq: Once | INTRAVENOUS | Status: AC
  Administered 2017-02-21: 14:00:00 via INTRAVENOUS

## 2017-02-13 MED ORDER — ONDANSETRON HCL 4 MG/2ML IJ SOLN
4.0000 mg | INTRAMUSCULAR | Status: DC | PRN
  Administered 2017-02-13: 4 mg via INTRAVENOUS
  Filled 2017-02-13: qty 2

## 2017-02-13 NOTE — ED Triage Notes (Signed)
PT reports emesis x4 days with increased reflux. PT also c/o diarrhea that started x2 days ago as well. PT states generalized malaise and poor appetite with body aches for weeks. PT states she was seen at [facility] ED in the waiting room but had waited for over 5 hours and left without being seen.

## 2017-02-13 NOTE — Consult Note (Signed)
Cardiology Consultation:   Patient ID: Jennifer [name]; [id number]; [birth year]   Admit date: 02/13/2017 Date of Consult: 02/14/2017  Primary Care Provider: Redmond School, MD Primary Cardiologist: Kate Sable, MD  Primary Electrophysiologist:  None Referring Provider: Karmen Bongo, MD   Patient Profile:   Jennifer [name] is a 66 y.o. female with a hx of CAD, HTN, HLD, prior DVT/PE,  who is being seen today for the evaluation of elevated troponin at the request of Dr. Lorin Mercy.  History of Present Illness:   Jennifer [name] is a 66 y.o. female with a hx of CAD, HTN, HLD, prior DVT/PE,  who is being seen today for the evaluation of elevated troponin.  The patient has a history of moderate CAD and multiple cardiac risk factors. She has followed with Dr. Bronson Ing, who last saw her in August 2018 for perioperative assessment prior to surgical carotid revascularization. She was on ASA prior to the procedure, which was transitioned to clopidogrel for 6 weeks after revascularization. Since that time she has been only on her chronic rivaroxaban for antithrombotic therapy.   She presented to [facility] today with nausea and vomiting with brown emesis. She also reported progressive fatigue with diffuse weakness and decreased exercise tolerance over the past several weeks. On further discussion, the patient endorsed dyspnea and chest discomfort with exertion that feels to be a burning sensation. Workup demonstrated Hgb of 7.0 for which transfusion was initiated. Labs were also notable for elevated troponin of 9.96 which decreased to 8.93 on repeat check several hours later. ECGs showed sinus rhythm with nonspecific ST changes. CTA aorta and CTA abdomen/pelvis showed no acute abnormalities. She was transferred to [facility] hospitalist service for further management. ASA 81 daily was started with deferral of other antithrombotic agents.   Cardiology was consulted for further evaluation of elevated  troponin. On my evaluation, the patient is resting comfortably in bed. She denies any active chest pain or dyspnea or other complaints. pRBC transfusion is continuing; repeat troponin resulted at 11.36.  Past Medical History:  Diagnosis Date  . Anxiety   . Arthritis   . CAD (coronary artery disease)    a. Cath 12/2014 - mild-moderate non-obstructive CAD with 60% mid RCA, 50% mid Cx-distal Cx, 30% distal LAD, 25% D2, EF 55-65%.  . Depression   . Dizziness   . DVT (deep venous thrombosis) (HCC)    on Xarelto  . Dyspnea    W/ EXERTION  . GERD (gastroesophageal reflux disease)   . Headache   . Heart murmur    DX   AT BIRTH  . Hyperlipidemia   . Hypertension   . 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 Neuropathy   . PE (pulmonary embolism)   . Tobacco abuse     Past Surgical History:  Procedure Laterality Date  . ABDOMINAL HYSTERECTOMY  1974  . CARDIAC CATHETERIZATION N/A 01/08/2015   Procedure: Left Heart Cath and Coronary Angiography;  Surgeon: Jolaine Artist, MD;  Location: [facility];  Service: Cardiovascular;  Laterality: N/A;  . CARPAL TUNNEL RELEASE     RIGHT  . CORONARY STENT PLACEMENT Right 1980  . KNEE ARTHROSCOPY W/ MENISCAL REPAIR     LEFT KNEE   FORMED DVT (SURG TO REMOVE BLOOD CLOT)  . TONSILLECTOMY       Home Medications:  Prior to Admission medications   Medication Sig Start Date End Date Taking? Authorizing Provider  allopurinol (ZYLOPRIM) 300 MG tablet  Take 300 mg by mouth daily. 12/19/16  Yes [provider]  atorvastatin (LIPITOR) 10 MG tablet Take 1 tablet (10 mg total) by mouth daily. 10/30/16  Yes Fields, Jessy Oto, MD  calcium carbonate (TUMS - DOSED IN MG ELEMENTAL CALCIUM) 500 MG chewable tablet Chew 6 tablets by mouth 3 (three) times daily with meals.   Yes [provider]  Cholecalciferol (VITAMIN D) 2000 units CAPS Take 2,000 Units by mouth daily.   Yes [provider]  DULoxetine (CYMBALTA) 30 MG  capsule Take 30 mg by mouth daily.   Yes [provider]  gabapentin (NEURONTIN) 600 MG tablet Take 300-600 mg by mouth See admin instructions. Take 300 mg twice daily IN THE MORNING AND AT NOON AND  600 mg at bedtime.   Yes [provider]  Menthol-Methyl Salicylate (MUSCLE RUB) 10-15 % CREA Apply 1 application topically 2 (two) times daily as needed for muscle pain.   Yes [provider]  Oxycodone HCl 10 MG TABS Take 1 tablet by mouth 3 (three) times daily as needed (back pain).  10/27/16  Yes [provider]  ranitidine (ZANTAC) 150 MG tablet Take 150 mg by mouth daily as needed for heartburn.   Yes [provider]  rivaroxaban (XARELTO) 20 MG TABS tablet Take 20 mg by mouth daily with supper.   Yes [provider]    Inpatient Medications: Scheduled Meds: . allopurinol  300 mg Oral Daily  . aspirin EC  81 mg Oral Daily  . atorvastatin  40 mg Oral Daily  . DULoxetine  30 mg Oral Daily  . gabapentin  300 mg Oral BID WC  . gabapentin  600 mg Oral QHS  . pantoprazole  40 mg Intravenous Q12H  . sodium chloride flush  3 mL Intravenous Q12H   Continuous Infusions: . sodium chloride 100 mL/hr at 02/13/17 1512  . sodium chloride    . sodium chloride     PRN Meds: sodium chloride, acetaminophen, famotidine, nitroGLYCERIN, ondansetron (ZOFRAN) IV, oxyCODONE, sodium chloride flush  Allergies:    Allergies  Allergen Reactions  . Bee Venom Anaphylaxis    Social History:   Social History   Socioeconomic History  . Marital status: Divorced    Spouse name: Not on file  . Number of children: 2  . Years of education: 33  . Highest education level: Not on file  Social Needs  . Financial resource strain: Not on file  . Food insecurity - worry: Not on file  . Food insecurity - inability: Not on file  . Transportation needs - medical: Not on file  . Transportation needs - non-medical: Not on file  Occupational History  . Occupation:  Retired  Tobacco Use  . Smoking status: Current Every Day Smoker    Packs/day: 1.00    Years: 50.00    Pack years: 50.00    Types: Cigarettes    Start date: 06/18/1967  . Smokeless tobacco: Never Used  . Tobacco comment: Down to 1/2 pk per day.   Substance and Sexual Activity  . Alcohol use: No    Alcohol/week: 0.0 oz  . Drug use: No  . Sexual activity: Not on file  Other Topics Concern  . Not on file  Social History Narrative   Lives at home alone.   Right-handed.   2 cups caffeine per day.    Family History:    Family History  Problem Relation Age of Onset  . Heart attack Mother   .  Heart attack Father   . Heart attack Brother        CABG  . Heart attack Brother        CABG  . Heart attack Brother        CABG  . Ovarian cancer Maternal Grandmother   . Heart disease Maternal Grandfather   . Dementia Paternal Grandmother      ROS:  Please see the history of present illness.  All other ROS reviewed and negative.     Physical Exam/Data:   Vitals:   02/13/17 2000 02/13/17 2100 02/13/17 2330 02/13/17 2345  BP: (!) 120/51 (!) 158/132 (!) 102/54 (!) 100/45  Pulse: 65 77 67 63  Resp: (!) 24 19 20    Temp:  98.4 F (36.9 C) 98.3 F (36.8 C) 98.5 F (36.9 C)  TempSrc:  Oral Oral Oral  SpO2: 97% 93% 99%   Weight:  95.8 kg (211 lb 3.2 oz)    Height:  5\' 5"  (1.651 m)      Intake/Output Summary (Last 24 hours) at 02/14/2017 0015 Last data filed at 02/13/2017 2330 Gross per 24 hour  Intake 977 ml  Output -  Net 977 ml   Filed Weights   02/13/17 1334 02/13/17 2100  Weight: 99.3 kg (219 lb) 95.8 kg (211 lb 3.2 oz)   Body mass index is 35.15 kg/m.  General:  Well nourished, well developed, in no acute distress HEENT: normal Neck: no JVD Endocrine:  No apparent thryomegaly Vascular: No carotid bruits; FA pulses 2+ bilaterally without bruits  Cardiac:  normal S1, S2; RRR; II/VI systolic murmur  Lungs:  Diffuse wheezes bilaterally Abd: soft, nontender Ext: no  edema Musculoskeletal:  No deformities, BUE and BLE strength normal and equal Skin: warm and dry  Neuro:  No focal abnormalities noted Psych:  Normal affect   EKG:  The EKG was personally reviewed and demonstrates: sinus bradycardia with non-specific ST changes Telemetry:  Telemetry was personally reviewed and demonstrates:  Sinus rhythm with no significant events  Relevant CV Studies:  TTE 08/2016: - Mild LVH with LVEF 65-70% and grade 1 diastolic dysfunction. -Mildly calcified mitral annulus. Loose mitral chordal structure noted, appears to be attached to the anterior mitral leaflet. Mitral leaflets are not flail, there is trivial mitral regurgitation noted.  -Moderately sclerotic aortic valve.  -Trivial tricuspid regurgitation with normal estimated PASP 17 mmHg.  LHC 12/2014: Mid RCA 60% stenosis, mid to distal Cx 50%, distal LAD 30%    Laboratory Data:  Chemistry Recent Labs  Lab 02/12/17 2010 02/13/17 1335  NA 139 137  K 3.4* 3.3*  CL 102 102  CO2 24 25  GLUCOSE 107* 99  BUN 5* 10  CREATININE 1.12* 1.02*  CALCIUM 10.0 9.0  GFRNONAA 50* 56*  GFRAA 58* >60  ANIONGAP 13 10    Recent Labs  Lab 02/12/17 2010 02/13/17 1335  PROT 6.4* 6.5  ALBUMIN 3.8 3.8  AST 48* 71*  ALT 12* 16  ALKPHOS 76 72  BILITOT 0.4 0.6   Hematology Recent Labs  Lab 02/12/17 2010 02/13/17 1335  WBC 9.1 11.2*  RBC 4.08 3.88  HGB 7.2* 7.0*  HCT 28.0* 27.1*  MCV 68.6* 69.8*  MCH 17.6* 18.0*  MCHC 25.7* 25.8*  RDW 20.2* 20.3*  PLT 372 374   Cardiac Enzymes Recent Labs  Lab 02/13/17 1335 02/13/17 1759 02/13/17 2135  TROPONINI 9.96* 8.93* 11.36*   No results for input(s): TROPIPOC in the last 168 hours.  BNPNo results for input(s):  BNP, PROBNP in the last 168 hours.  DDimer No results for input(s): DDIMER in the last 168 hours.  Radiology/Studies:  Dg Chest 2 View  Result Date: 02/13/2017 CLINICAL DATA:  emesis x4 days with increased reflux. --- diarrhea that started x2  days ago as well. PT states generalized malaise and poor appetite with body aches for weeks. EXAM: CHEST - 2 VIEW COMPARISON:  CT 01/06/2015 FINDINGS: Lungs are clear. Heart size upper limits normal.  Atheromatous aorta. No effusion.  No pneumothorax. Visualized bones unremarkable. IMPRESSION: No acute cardiopulmonary disease. Aortic Atherosclerosis (ICD10-170.0) Electronically Signed   By: Lucrezia Europe M.D.   On: 02/13/2017 15:52   Ct Abdomen Pelvis W Contrast  Result Date: 02/13/2017 CLINICAL DATA:  Vomiting for several days with decreased appetite EXAM: CT ABDOMEN AND PELVIS WITH CONTRAST TECHNIQUE: Multidetector CT imaging of the abdomen and pelvis was performed using the standard protocol following bolus administration of intravenous contrast. CONTRAST:  131mL ISOVUE-300 IOPAMIDOL (ISOVUE-300) INJECTION 61% COMPARISON:  04/13/2016 FINDINGS: Lower chest: No acute abnormality. Hepatobiliary: No focal liver abnormality is seen. No gallstones, gallbladder wall thickening, or biliary dilatation. Pancreas: Unremarkable. No pancreatic ductal dilatation or surrounding inflammatory changes. Spleen: Normal in size without focal abnormality. Adrenals/Urinary Tract: The adrenal glands are within normal limits. Solitary left kidney is noted. No renal calculi or obstructive changes are noted. A simple cyst is noted in the upper pole stable from the prior exam. No obstructive changes are noted. The right kidney is severely shrunken and calcified stable from the prior exam. The bladder is well distended. Stomach/Bowel: Scattered diverticular change of the colon is noted. The colon is predominately decompressed. No inflammatory or obstructive changes are seen. The appendix is within normal limits. No obstructive or inflammatory changes in small bowel are seen. Vascular/Lymphatic: Aortic atherosclerosis. No enlarged abdominal or pelvic lymph nodes. Reproductive: Status post hysterectomy. No adnexal masses. Other: No abdominal  wall hernia or abnormality. No abdominopelvic ascites. Musculoskeletal: Mild degenerative changes of lumbar spine are noted. No acute abnormality seen. IMPRESSION: Chronic changes as described above stable from the prior exam. No acute abnormality to correspond with the patient's clinical symptomatology is noted. Electronically Signed   By: Inez Catalina M.D.   On: 02/13/2017 15:47   Ct Angio Chest Aorta W/cm &/or Wo/cm  Result Date: 02/13/2017 CLINICAL DATA:  Mid chest pressure today, complex chest pain, elevated troponins, question aortic dissection EXAM: CT ANGIOGRAPHY CHEST WITH CONTRAST TECHNIQUE: Multidetector CT imaging of the chest was performed using the standard protocol during bolus administration of intravenous contrast. Multiplanar CT image reconstructions and MIPs were obtained to evaluate the vascular anatomy. CONTRAST:  143mL ISOVUE-370 IOPAMIDOL (ISOVUE-370) INJECTION 76% IV COMPARISON:  01/06/2015 FINDINGS: Cardiovascular: Extensive atherosclerotic calcifications aorta, proximal great vessels and coronary arteries. Aorta normal caliber 3.5 cm transverse image 27. No intramural hematoma on precontrast imaging. No para-aortic hemorrhage. Following contrast, normal aortic enhancement is seen without aneurysm or dissection. Heart unremarkable. No pericardial effusion. Pulmonary arteries patent without evidence of pulmonary embolism. Mediastinum/Nodes: Esophagus unremarkable. No thoracic adenopathy. Base of cervical region normal appearance. Lungs/Pleura: Central peribronchial thickening. Lungs clear. No pulmonary infiltrate, pleural effusion or pneumothorax. Upper Abdomen: Tiny cyst upper pole LEFT kidney. Small splenules adjacent to spleen. Adrenal thickening without nodularity. Remaining visualized portions of upper abdomen unremarkable. Musculoskeletal: No acute abnormalities. Endplate spur formation lower thoracic spine. Review of the MIP images confirms the above findings. IMPRESSION: No  evidence of aortic aneurysm or dissection. Central bronchitic changes. No acute intrathoracic abnormalities otherwise  identified. Extensive atherosclerotic calcifications including in coronary arteries. Aortic Atherosclerosis (ICD10-I70.0). Electronically Signed   By: Lavonia Dana M.D.   On: 02/13/2017 19:28    Assessment and Plan:   Jennifer [name] is a 66 y.o. female with a hx of CAD, HTN, HLD, prior DVT/PE,  who is being seen today for the evaluation of elevated troponin.  Elevated troponin CAD CP and DOE The patient has a history of known moderate CAD. She presents with upper GI symptoms with a significant decrease in Hgb, concerning for UGIB. She also describes diffuse weakness and progressive CP and DOE concerning for angina; however, she has no chest pain at rest and is currently chest pain free. Workup is notable for significantly elevated troponin; ECG shows nonspecific ST changes. Her presentation is consistent with cardiac ischemia; however, given her acute anemia, it is likely that her elevated troponin represents demand ischemia in the setting of fixed coronary lesions rather than primary plaque rupture. Nevertheless, ACS cannot be ruled out at this time. Unfortunately, given her presumed active bleeding, acute management with antithrombotic therapy poses increased risk, as would DAPT if she were to need revascularization. Therefore, at this time, would recommend management of anemia with investigation/treatment of underlying cause. She will likely need repeat ischemic evaluation, although this may reasonably be performed pending further stabilization of bleeding (unless she has signs of new/worsening angina). If her troponin significantly rises and/or she has concerning changes in her symptoms, ischemic evaluation may need to be expedited.   Recommendations: -Agree with pRBC transfusion with with ongoing evaluation/treatment of anemia -Continue to monitor on telemetry -Continue to trend  troponin -Agree with challenge of ASA 81 daily per primary team -Reasonable to hold systemic anticoagulation until bleeding has stabilized -Continue atorvastatin 40 mg daily -Lipid panel ordered. HgA1c 5.5 in 06/2016 -Repeat echocardiogram ordered -We will continue to follow along to determine type/timing of ischemic evaluation. Please let cardiology know if the patient has any further chest pain.   For questions or updates, please contact [facility] Please consult [email or web address] for contact info under Cardiology/STEMI.   Signed, Nila Nephew, MD  02/14/2017 12:15 AM

## 2017-02-13 NOTE — ED Notes (Signed)
No answer for vitals  

## 2017-02-13 NOTE — Progress Notes (Signed)
Received patient from [facility], assessment completed, VS documented, oriented patient to the room.  CHG bath completed, MRSA swab sent, Will continue to monitor. 

## 2017-02-13 NOTE — ED Notes (Signed)
Taken to ct  

## 2017-02-13 NOTE — ED Notes (Addendum)
CRITICAL VALUE ALERT  Critical Value:  Troponin 9.96  Date & Time Notied:  02/13/17 @ 7741  Provider Notified: Dr. Thurnell Garbe  Orders Received/Actions taken: EDP and Alyse Low, RN made aware. No new orders obtained.

## 2017-02-13 NOTE — Progress Notes (Signed)
Cardiology notified of critical troponin 11.36

## 2017-02-13 NOTE — ED Notes (Signed)
No answer for vitals x2 

## 2017-02-13 NOTE — ED Provider Notes (Signed)
[facility] EMERGENCY DEPARTMENT Provider Note   CSN: [id number] Arrival date & time: 02/13/17  1302     History   Chief Complaint Chief Complaint  Patient presents with  . Emesis    HPI Jennifer [name] is a 66 y.o. female.  HPI  Pt was seen at 1440. Per pt, c/o gradual onset and persistence of multiple intermittent episodes of N/V/D for the past 1 month, worse over the past 4 days. Has been associated with increased "reflux" symptoms (burping). Has been associated with generalized fatigue, generalized body aches, poor PO intake. Denies abd pain, no CP/SOB, no back pain, no fevers, no black or blood in stools or emesis, no visual changes, no focal motor weakness, no tingling/numbness in extremities, no ataxia, no slurred speech, no facial droop.     Past Medical History:  Diagnosis Date  . Anxiety   . Arthritis   . CAD (coronary artery disease)    a. Cath 12/2014 - mild-moderate non-obstructive CAD with 60% mid RCA, 50% mid Cx-distal Cx, 30% distal LAD, 25% D2, EF 55-65%.  . Depression   . Dizziness   . DVT (deep venous thrombosis) ([place])   . Dyspnea    W/ EXERTION  . GERD (gastroesophageal reflux disease)   . Headache   . Heart murmur    DX   AT BIRTH  . Hyperlipidemia   . Hypertension   . 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 Neuropathy   . PE (pulmonary embolism)   . Tobacco abuse     Patient Active Problem List   Diagnosis Date Noted  . Symptomatic stenosis of right carotid artery without infarction 11/01/2016  . Syncope and collapse 08/10/2016  . Morbid obesity ([place]) 08/10/2016  . Low back pain 07/28/2016  . Gait abnormality 07/28/2016  . Carotid artery stenosis 07/28/2016  . Paresthesia 07/28/2016  . Dizziness 06/20/2016  . Numbness 06/20/2016  . CAD in native artery 01/08/2015  . Chest pain 01/06/2015  . Essential hypertension 01/06/2015  . Hyperlipidemia 01/06/2015  . History of pulmonary embolus (PE) 01/06/2015  . Leukocytosis 01/06/2015    . Tobacco use disorder 01/06/2015  . History of DVT (deep vein thrombosis) 01/06/2015    Past Surgical History:  Procedure Laterality Date  . ABDOMINAL HYSTERECTOMY    . CARDIAC CATHETERIZATION N/A 01/08/2015   Procedure: Left Heart Cath and Coronary Angiography;  Surgeon: Jolaine Artist, MD;  Location: [place] [facility];  Service: Cardiovascular;  Laterality: N/A;  . CARPAL TUNNEL RELEASE     RIGHT  . CORONARY STENT PLACEMENT    . KNEE ARTHROSCOPY W/ MENISCAL REPAIR     LEFT KNEE   FORMED DVT (SURG TO REMOVE BLOOD CLOT)  . TONSILLECTOMY      OB History    Gravida Para Term Preterm AB Living             2   SAB TAB Ectopic Multiple Live Births                   Home Medications    Prior to Admission medications   Medication Sig Start Date End Date Taking? Authorizing Provider  allopurinol (ZYLOPRIM) 300 MG tablet Take 300 mg by mouth daily. 12/19/16  Yes [provider]  atorvastatin (LIPITOR) 10 MG tablet Take 1 tablet (10 mg total) by mouth daily. 10/30/16  Yes Fields, Jessy Oto, MD  calcium carbonate (TUMS - DOSED IN MG ELEMENTAL CALCIUM) 500 MG chewable  tablet Chew 6 tablets by mouth 3 (three) times daily with meals.   Yes [provider]  Cholecalciferol (VITAMIN D) 2000 units CAPS Take 2,000 Units by mouth daily.   Yes [provider]  DULoxetine (CYMBALTA) 30 MG capsule Take 30 mg by mouth daily.   Yes [provider]  gabapentin (NEURONTIN) 600 MG tablet Take 300-600 mg by mouth See admin instructions. Take 300 mg twice daily IN THE MORNING AND AT NOON AND  600 mg at bedtime.   Yes [provider]  Menthol-Methyl Salicylate (MUSCLE RUB) 10-15 % CREA Apply 1 application topically 2 (two) times daily as needed for muscle pain.   Yes [provider]  Oxycodone HCl 10 MG TABS Take 1 tablet by mouth 3 (three) times daily as needed (back pain).  10/27/16  Yes [provider]  ranitidine (ZANTAC) 150 MG tablet  Take 150 mg by mouth daily as needed for heartburn.   Yes [provider]  rivaroxaban (XARELTO) 20 MG TABS tablet Take 20 mg by mouth daily with supper.   Yes [provider]  clopidogrel (PLAVIX) 75 MG tablet Take 1 tablet (75 mg total) by mouth daily. Patient not taking: Reported on 02/12/2017 06/20/16   Marcial Pacas, MD  oxyCODONE-acetaminophen (PERCOCET/ROXICET) 5-325 MG tablet Take 1-2 tablets by mouth every 4 (four) hours as needed for severe pain. Patient not taking: Reported on 02/12/2017 10/29/16   Nat Christen, MD  predniSONE (DELTASONE) 10 MG tablet Take 1 tablet (10 mg total) by mouth daily with breakfast. 3 tablets daily for 3 days, 2 tablets daily for 3 days, one tablet daily for 3 days. Patient not taking: Reported on 11/16/2016 10/29/16   Nat Christen, MD    Family History Family History  Problem Relation Age of Onset  . Heart attack Mother   . Heart attack Father   . Heart attack Brother        CABG  . Heart attack Brother        CABG  . Heart attack Brother        CABG  . Ovarian cancer Maternal Grandmother   . Heart disease Maternal Grandfather   . Dementia Paternal Grandmother     Social History Social History   Tobacco Use  . Smoking status: Current Every Day Smoker    Packs/day: 1.00    Types: Cigarettes    Start date: 06/18/1967  . Smokeless tobacco: Never Used  . Tobacco comment: Down to 1/2 pk per day.   Substance Use Topics  . Alcohol use: No    Alcohol/week: 0.0 oz  . Drug use: No     Allergies   Bee venom   Review of Systems Review of Systems ROS: Statement: All systems negative except as marked or noted in the HPI; Constitutional: Negative for fever and chills. +generalized body aches, fatigue.; ; Eyes: Negative for eye pain, redness and discharge. ; ; ENMT: Negative for ear pain, hoarseness, nasal congestion, sinus pressure and sore throat. ; ; Cardiovascular: Negative for chest pain, palpitations, diaphoresis, dyspnea and  peripheral edema. ; ; Respiratory: Negative for cough, wheezing and stridor. ; ; Gastrointestinal: +N/V/D. Negative for abdominal pain, blood in stool, hematemesis, jaundice and rectal bleeding. . ; ; Genitourinary: Negative for dysuria, flank pain and hematuria. ; ; Musculoskeletal: Negative for back pain and neck pain. Negative for swelling and trauma.; ; Skin: Negative for pruritus, rash, abrasions, blisters, bruising and skin lesion.; ; Neuro: Negative for headache, lightheadedness and neck  stiffness. Negative for altered level of consciousness, altered mental status, extremity weakness, paresthesias, involuntary movement, seizure and syncope.      Physical Exam Updated Vital Signs BP (!) 110/49   Pulse 65   Temp 98.6 F (37 C) (Oral)   Resp 18   Ht 5\' 5"  (1.651 m)   Wt 99.3 kg (219 lb)   SpO2 93%   BMI 36.44 kg/m   Physical Exam 1445: Physical examination:  Nursing notes reviewed; Vital signs and O2 SAT reviewed;  Constitutional: Well developed, Well nourished, Well hydrated, In no acute distress; Head:  Normocephalic, atraumatic; Eyes: EOMI, PERRL, No scleral icterus; ENMT: Mouth and pharynx normal, Mucous membranes moist; Neck: Supple, Full range of motion, No lymphadenopathy; Cardiovascular: Regular rate and rhythm, No gallop; Respiratory: Breath sounds clear & equal bilaterally, No wheezes.  Speaking full sentences with ease, Normal respiratory effort/excursion; Chest: Nontender, Movement normal; Abdomen: Soft, Nontender, Nondistended, Normal bowel sounds. Rectal exam performed w/permission of pt and ED RN chaperone present.  Anal tone normal.  Non-tender, soft brown stool in rectal vault, heme neg.  No fissures, no external hemorrhoids, no palp masses.; Genitourinary: No CVA tenderness; Extremities: Pulses normal, No tenderness, No edema, No calf edema or asymmetry.; Neuro: AA&Ox3, Major CN grossly intact. No facial droop. Speech clear. No gross focal motor or sensory deficits in  extremities.; Skin: Color normal, Warm, Dry.   ED Treatments / Results  Labs (all labs ordered are listed, but only abnormal results are displayed)   EKG  EKG Interpretation  Date/Time:  Tuesday February 13 2017 14:51:12 EST Ventricular Rate:  58 PR Interval:    QRS Duration: 98 QT Interval:  434 QTC Calculation: 427 R Axis:   30 Text Interpretation:  Sinus rhythm Low voltage, precordial leads Borderline repolarization abnormality Baseline wander When compared with ECG of 08/10/2016 Nonspecific ST and T wave abnormality is now Present Confirmed by Francine Graven [id number]) on 02/13/2017 3:01:45 PM       Radiology   Procedures Procedures (including critical care time)  Medications Ordered in ED Medications  0.9 %  sodium chloride infusion (not administered)  ondansetron (ZOFRAN) injection 4 mg (not administered)  famotidine (PEPCID) IVPB 20 mg premix (not administered)     Initial Impression / Assessment and Plan / ED Course  I have reviewed the triage vital signs and the nursing notes.  Pertinent labs & imaging results that were available during my care of the patient were reviewed by me and considered in my medical decision making (see chart for details).  MDM Reviewed: previous chart, nursing note and vitals Reviewed previous: labs and ECG Interpretation: labs, ECG, x-ray and CT scan Total time providing critical care: 30-74 minutes. This excludes time spent performing separately reportable procedures and services. Consults: cardiology and admitting MD   CRITICAL CARE Performed by: Alfonzo Feller Total critical care time: 35 minutes Critical care time was exclusive of separately billable procedures and treating other patients. Critical care was necessary to treat or prevent imminent or life-threatening deterioration. Critical care was time spent personally by me on the following activities: development of treatment plan with patient and/or surrogate as well  as nursing, discussions with consultants, evaluation of patient's response to treatment, examination of patient, obtaining history from patient or surrogate, ordering and performing treatments and interventions, ordering and review of laboratory studies, ordering and review of radiographic studies, pulse oximetry and re-evaluation of patient's condition.    Results for orders placed or performed during the hospital encounter of 02/13/17  Lipase, blood  Result Value Ref Range   Lipase 21 11 - 51 U/L  Comprehensive metabolic panel  Result Value Ref Range   Sodium 137 135 - 145 mmol/L   Potassium 3.3 (L) 3.5 - 5.1 mmol/L   Chloride 102 101 - 111 mmol/L   CO2 25 22 - 32 mmol/L   Glucose, Bld 99 65 - 99 mg/dL   BUN 10 6 - 20 mg/dL   Creatinine, Ser 1.02 (H) 0.44 - 1.00 mg/dL   Calcium 9.0 8.9 - 10.3 mg/dL   Total Protein 6.5 6.5 - 8.1 g/dL   Albumin 3.8 3.5 - 5.0 g/dL   AST 71 (H) 15 - 41 U/L   ALT 16 14 - 54 U/L   Alkaline Phosphatase 72 38 - 126 U/L   Total Bilirubin 0.6 0.3 - 1.2 mg/dL   GFR calc non Af Amer 56 (L) >60 mL/min   GFR calc Af Amer >60 >60 mL/min   Anion gap 10 5 - 15  CBC  Result Value Ref Range   WBC 11.2 (H) 4.0 - 10.5 K/uL   RBC 3.88 3.87 - 5.11 MIL/uL   Hemoglobin 7.0 (L) 12.0 - 15.0 g/dL   HCT 27.1 (L) 36.0 - 46.0 %   MCV 69.8 (L) 78.0 - 100.0 fL   MCH 18.0 (L) 26.0 - 34.0 pg   MCHC 25.8 (L) 30.0 - 36.0 g/dL   RDW 20.3 (H) 11.5 - 15.5 %   Platelets 374 150 - 400 K/uL  Differential  Result Value Ref Range   Neutrophils Relative % 78 %   Neutro Abs 8.4 (H) 1.7 - 7.7 K/uL   Lymphocytes Relative 14 %   Lymphs Abs 1.5 0.7 - 4.0 K/uL   Monocytes Relative 7 %   Monocytes Absolute 0.8 0.1 - 1.0 K/uL   Eosinophils Relative 1 %   Eosinophils Absolute 0.1 0.0 - 0.7 K/uL   Basophils Relative 0 %   Basophils Absolute 0.0 0.0 - 0.1 K/uL  Troponin I  Result Value Ref Range   Troponin I 9.96 (HH) <0.03 ng/mL  POC occult blood, ED  Result Value Ref Range   Fecal  Occult Bld NEGATIVE NEGATIVE  Type and screen  Result Value Ref Range   ABO/RH(D) A POS    Antibody Screen NEG    Sample Expiration 02/16/2017    Unit Number [id number]    Blood Component Type RED CELLS,LR    Unit division 00    Status of Unit ISSUED    Transfusion Status OK TO TRANSFUSE    Crossmatch Result Compatible    Unit Number [id number]    Blood Component Type RED CELLS,LR    Unit division 00    Status of Unit ALLOCATED    Transfusion Status OK TO TRANSFUSE    Crossmatch Result Compatible   Prepare RBC  Result Value Ref Range   Order Confirmation ORDER PROCESSED BY BLOOD BANK   ABO/Rh  Result Value Ref Range   ABO/RH(D) A POS   BPAM RBC  Result Value Ref Range   ISSUE DATE / TIME [id number]    Blood Product Unit Number [id number]    Unit Type and Rh 8850    Blood Product Expiration Date [id number]    Blood Product Unit Number [id number]    Unit Type and Rh 6200    Blood Product Expiration Date [id number]     Dg Chest 2 View Result Date: 02/13/2017 CLINICAL DATA:  emesis x4 days with increased reflux. ---  diarrhea that started x2 days ago as well. PT states generalized malaise and poor appetite with body aches for weeks. EXAM: CHEST - 2 VIEW COMPARISON:  CT 01/06/2015 FINDINGS: Lungs are clear. Heart size upper limits normal.  Atheromatous aorta. No effusion.  No pneumothorax. Visualized bones unremarkable. IMPRESSION: No acute cardiopulmonary disease. Aortic Atherosclerosis (ICD10-170.0) Electronically Signed   By: Lucrezia Europe M.D.   On: 02/13/2017 15:52   Ct Abdomen Pelvis W Contrast Result Date: 02/13/2017 CLINICAL DATA:  Vomiting for several days with decreased appetite EXAM: CT ABDOMEN AND PELVIS WITH CONTRAST TECHNIQUE: Multidetector CT imaging of the abdomen and pelvis was performed using the standard protocol following bolus administration of intravenous contrast. CONTRAST:  119mL ISOVUE-300 IOPAMIDOL (ISOVUE-300) INJECTION 61% COMPARISON:   04/13/2016 FINDINGS: Lower chest: No acute abnormality. Hepatobiliary: No focal liver abnormality is seen. No gallstones, gallbladder wall thickening, or biliary dilatation. Pancreas: Unremarkable. No pancreatic ductal dilatation or surrounding inflammatory changes. Spleen: Normal in size without focal abnormality. Adrenals/Urinary Tract: The adrenal glands are within normal limits. Solitary left kidney is noted. No renal calculi or obstructive changes are noted. A simple cyst is noted in the upper pole stable from the prior exam. No obstructive changes are noted. The right kidney is severely shrunken and calcified stable from the prior exam. The bladder is well distended. Stomach/Bowel: Scattered diverticular change of the colon is noted. The colon is predominately decompressed. No inflammatory or obstructive changes are seen. The appendix is within normal limits. No obstructive or inflammatory changes in small bowel are seen. Vascular/Lymphatic: Aortic atherosclerosis. No enlarged abdominal or pelvic lymph nodes. Reproductive: Status post hysterectomy. No adnexal masses. Other: No abdominal wall hernia or abnormality. No abdominopelvic ascites. Musculoskeletal: Mild degenerative changes of lumbar spine are noted. No acute abnormality seen. IMPRESSION: Chronic changes as described above stable from the prior exam. No acute abnormality to correspond with the patient's clinical symptomatology is noted. Electronically Signed   By: Inez Catalina M.D.   On: 02/13/2017 15:47   Results for YOSHI, VICENCIO (MRN [id number]) as of 02/13/2017 17:31  Ref. Range 01/07/2015 03:11 01/08/2015 03:43 04/13/2016 13:40 10/26/2016 11:39 11/02/2016 03:40 02/12/2017 20:10 02/13/2017 13:35  Hemoglobin Latest Ref Range: 12.0 - 15.0 g/dL 13.8 13.3 14.2 14.7 11.9 (L) 7.2 (L) 7.0 (L)  HCT Latest Ref Range: 36.0 - 46.0 % 42.7 41.3 43.4 44.6 37.2 28.0 (L) 27.1 (L)     1515:  Troponin elevated with NS STTW changes; pt denies CP.  Unable to give  ASA or heparin given new anemia.  T/C to Cards Dr. Bronson Ing, case discussed, including:  HPI, pertinent PM/SHx, VS/PE, dx testing, ED course and treatment:  He has viewed the chart, agrees no ASA or heparin at this time, troponin elevation likely d/t demand from anemia/GIB, pt needs PRBC transfusion and GI consult before any Cards intervention.  1700:  PRBC's ordered/transfused.  Dx and testing d/w pt and family.  Questions answered.  Verb understanding, agreeable to admit. T/C to [facility] Dr. Lorin Mercy, case discussed, including:  HPI, pertinent PM/SHx, VS/PE, dx testing, ED course and treatment:  Agreeable to admit.      Final Clinical Impressions(s) / ED Diagnoses   Final diagnoses:  None    ED Discharge Orders    None       Francine Graven, DO 02/16/17 1093

## 2017-02-13 NOTE — ED Notes (Addendum)
CRITICAL VALUE ALERT  Critical Value:  Troponin 8.93  Date & Time Notied:  02/13/17/1853  Provider Notified:[place]   Orders Received/Actions taken:

## 2017-02-13 NOTE — H&P (Signed)
History and Physical    [name] YIR:[id number] DOB: Oct 25, 1950 DOA: 02/13/2017  PCP: Redmond School, MD Consultants:  Bronson Ing - cardiology; [facility] - urology; Early - vascular Patient coming from:  Home - lives with son; Jennifer [name]: Jennifer [name], [phone]  Chief Complaint: n/v/d; chest pain  HPI: Jennifer [name] is a 66 y.o. female with medical history significant of HTN, HLD, DVT, and nonobstructive CAD presenting with n/v/d and chest pain.  She reports that she has had a lot of weak spells but she has had n/v/d for 4 days.  Too weak to stand, walk.  Since she had the stent placed in her neck, within a month or so she gets really dizzy spells and has to sit down in the floor because she is too weak and shaky.  Eating Rolaids trying to get her stomach to calm down but nothing seems to work.  No further n/v/d today.  No sick contacts.  She has not seen blood in her stools and they are not dark.  She did have "brown-looking" emesis yesterday but denies frank blood or black/tarry emesis.  +chest discomfort for a couple of days and radiating into her back today.  Chest discomfort is worse with exertion.  It is better with rest.  +SOB with exertion.  She is finding that she has to go to bed when she is caring for her elderly friend, "I just don't have no energy, no umph, my son's doing all my housework" over the last month.   ED Course: GI bleed, Hgb 7.  Initiating transfusion.  Meanwhile troponin 9.96.  D. Koneswaran consulted and believes this to be demand ischemia.  SDU at [facility].  Review of Systems: As per HPI; otherwise review of systems reviewed and negative.   Ambulatory Status:  Ambulates with a cane or walker only over the last month, has to use a shower chair now because she is so weak.  Past Medical History:  Diagnosis Date  . Anxiety   . Arthritis   . CAD (coronary artery disease)    a. Cath 12/2014 - mild-moderate non-obstructive CAD with 60% mid RCA, 50% mid Cx-distal Cx,  30% distal LAD, 25% D2, EF 55-65%.  . Depression   . Dizziness   . DVT (deep venous thrombosis) (HCC)    on Xarelto  . Dyspnea    W/ EXERTION  . GERD (gastroesophageal reflux disease)   . Headache   . Heart murmur    DX   AT BIRTH  . Hyperlipidemia   . Hypertension   . 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 Neuropathy   . PE (pulmonary embolism)   . Tobacco abuse     Past Surgical History:  Procedure Laterality Date  . ABDOMINAL HYSTERECTOMY  1974  . CARDIAC CATHETERIZATION N/A 01/08/2015   Procedure: Left Heart Cath and Coronary Angiography;  Surgeon: Jolaine Artist, MD;  Location: [place] [facility];  Service: Cardiovascular;  Laterality: N/A;  . CARPAL TUNNEL RELEASE     RIGHT  . CORONARY STENT PLACEMENT Right 1980  . KNEE ARTHROSCOPY W/ MENISCAL REPAIR     LEFT KNEE   FORMED DVT (SURG TO REMOVE BLOOD CLOT)  . TONSILLECTOMY      Social History   Socioeconomic History  . Marital status: Divorced    Spouse name: Not on file  . Number of children: 2  . Years of education: 53  . Highest education level: Not on file  Social  Needs  . Financial resource strain: Not on file  . Food insecurity - worry: Not on file  . Food insecurity - inability: Not on file  . Transportation needs - medical: Not on file  . Transportation needs - non-medical: Not on file  Occupational History  . Occupation: Retired  Tobacco Use  . Smoking status: Current Every Day Smoker    Packs/day: 1.00    Years: 50.00    Pack years: 50.00    Types: Cigarettes    Start date: 06/18/1967  . Smokeless tobacco: Never Used  . Tobacco comment: Down to 1/2 pk per day.   Substance and Sexual Activity  . Alcohol use: No    Alcohol/week: 0.0 oz  . Drug use: No  . Sexual activity: Not on file  Other Topics Concern  . Not on file  Social History Narrative   Lives at home alone.   Right-handed.   2 cups caffeine per day.    Allergies  Allergen Reactions  . Bee Venom Anaphylaxis     Family History  Problem Relation Age of Onset  . Heart attack Mother   . Heart attack Father   . Heart attack Brother        CABG  . Heart attack Brother        CABG  . Heart attack Brother        CABG  . Ovarian cancer Maternal Grandmother   . Heart disease Maternal Grandfather   . Dementia Paternal Grandmother     Prior to Admission medications   Medication Sig Start Date End Date Taking? Authorizing Provider  allopurinol (ZYLOPRIM) 300 MG tablet Take 300 mg by mouth daily. 12/19/16  Yes [provider]  atorvastatin (LIPITOR) 10 MG tablet Take 1 tablet (10 mg total) by mouth daily. 10/30/16  Yes Fields, Jessy Oto, MD  calcium carbonate (TUMS - DOSED IN MG ELEMENTAL CALCIUM) 500 MG chewable tablet Chew 6 tablets by mouth 3 (three) times daily with meals.   Yes [provider]  Cholecalciferol (VITAMIN D) 2000 units CAPS Take 2,000 Units by mouth daily.   Yes [provider]  DULoxetine (CYMBALTA) 30 MG capsule Take 30 mg by mouth daily.   Yes [provider]  gabapentin (NEURONTIN) 600 MG tablet Take 300-600 mg by mouth See admin instructions. Take 300 mg twice daily IN THE MORNING AND AT NOON AND  600 mg at bedtime.   Yes [provider]  Menthol-Methyl Salicylate (MUSCLE RUB) 10-15 % CREA Apply 1 application topically 2 (two) times daily as needed for muscle pain.   Yes [provider]  Oxycodone HCl 10 MG TABS Take 1 tablet by mouth 3 (three) times daily as needed (back pain).  10/27/16  Yes [provider]  ranitidine (ZANTAC) 150 MG tablet Take 150 mg by mouth daily as needed for heartburn.   Yes [provider]  rivaroxaban (XARELTO) 20 MG TABS tablet Take 20 mg by mouth daily with supper.   Yes [provider]  clopidogrel (PLAVIX) 75 MG tablet Take 1 tablet (75 mg total) by mouth daily. Patient not taking: Reported on 02/12/2017 06/20/16   Marcial Pacas, MD  oxyCODONE-acetaminophen (PERCOCET/ROXICET)  5-325 MG tablet Take 1-2 tablets by mouth every 4 (four) hours as needed for severe pain. Patient not taking: Reported on 02/12/2017 10/29/16   Nat Christen, MD  predniSONE (DELTASONE) 10 MG tablet Take 1 tablet (10 mg total) by mouth daily with breakfast. 3 tablets daily for 3  days, 2 tablets daily for 3 days, one tablet daily for 3 days. Patient not taking: Reported on 11/16/2016 10/29/16   Nat Christen, MD    Physical Exam: Vitals:   02/13/17 1700 02/13/17 1758 02/13/17 1800 02/13/17 1835  BP: (!) 117/54 (!) 126/47 (!) 115/52   Pulse: 60 66 62 (!) 57  Resp: 19 19 17 17   Temp:      TempSrc:      SpO2: 98% 95% 99% 100%  Weight:      Height:         General:  Appears calm and comfortable and is NAD; denies chest symptoms at rest but is having ongoing mid-back pain that she reports as "in the back of my chest" Eyes:  PERRL, EOMI, normal lids, iris ENT:  grossly normal hearing, lips & tongue, mmm Neck:  no LAD, masses or thyromegaly; B carotid bruits Cardiovascular:  RRR, 2+ murmur, no r/g. No LE edema.  Respiratory:   CTA bilaterally with no wheezes/rales/rhonchi.  Normal respiratory effort. Abdomen:  soft, mild diffuse TTP, ND, NABS Skin:  no rash or induration seen on limited exam Musculoskeletal:  grossly normal tone BUE/BLE, good ROM, no bony abnormality Psychiatric:  blunted mood and affect, speech fluent and appropriate, AOx3 Neurologic:  CN 2-12 grossly intact, moves all extremities in coordinated fashion, sensation intact    Radiological Exams on Admission: Dg Chest 2 View  Result Date: 02/13/2017 CLINICAL DATA:  emesis x4 days with increased reflux. --- diarrhea that started x2 days ago as well. PT states generalized malaise and poor appetite with body aches for weeks. EXAM: CHEST - 2 VIEW COMPARISON:  CT 01/06/2015 FINDINGS: Lungs are clear. Heart size upper limits normal.  Atheromatous aorta. No effusion.  No pneumothorax. Visualized bones unremarkable. IMPRESSION: No acute  cardiopulmonary disease. Aortic Atherosclerosis (ICD10-170.0) Electronically Signed   By: Lucrezia Europe M.D.   On: 02/13/2017 15:52   Ct Abdomen Pelvis W Contrast  Result Date: 02/13/2017 CLINICAL DATA:  Vomiting for several days with decreased appetite EXAM: CT ABDOMEN AND PELVIS WITH CONTRAST TECHNIQUE: Multidetector CT imaging of the abdomen and pelvis was performed using the standard protocol following bolus administration of intravenous contrast. CONTRAST:  152mL ISOVUE-300 IOPAMIDOL (ISOVUE-300) INJECTION 61% COMPARISON:  04/13/2016 FINDINGS: Lower chest: No acute abnormality. Hepatobiliary: No focal liver abnormality is seen. No gallstones, gallbladder wall thickening, or biliary dilatation. Pancreas: Unremarkable. No pancreatic ductal dilatation or surrounding inflammatory changes. Spleen: Normal in size without focal abnormality. Adrenals/Urinary Tract: The adrenal glands are within normal limits. Solitary left kidney is noted. No renal calculi or obstructive changes are noted. A simple cyst is noted in the upper pole stable from the prior exam. No obstructive changes are noted. The right kidney is severely shrunken and calcified stable from the prior exam. The bladder is well distended. Stomach/Bowel: Scattered diverticular change of the colon is noted. The colon is predominately decompressed. No inflammatory or obstructive changes are seen. The appendix is within normal limits. No obstructive or inflammatory changes in small bowel are seen. Vascular/Lymphatic: Aortic atherosclerosis. No enlarged abdominal or pelvic lymph nodes. Reproductive: Status post hysterectomy. No adnexal masses. Other: No abdominal wall hernia or abnormality. No abdominopelvic ascites. Musculoskeletal: Mild degenerative changes of lumbar spine are noted. No acute abnormality seen. IMPRESSION: Chronic changes as described above stable from the prior exam. No acute abnormality to correspond with the patient's clinical symptomatology  is noted. Electronically Signed   By: Inez Catalina M.D.   On: 02/13/2017 15:47  EKG: Independently reviewed.  NSR with rate 58; nonspecific ST changes with no evidence of acute ischemia   Labs on Admission: I have personally reviewed the available labs and imaging studies at the time of the admission.  Pertinent labs:   Heme negative K+ 3.3 BUN 1/Creatinine 1.02/GFR 56 - stable/improved Troponin 9.96, 8.9.3 WBC 11.2 Hgb 7.0; 7.2 on 12/3; 11.9 on 8/23 A1c 5.5 in 4/18 TSH 1.480 in 4/18   Assessment/Plan Principal Problem:   NSTEMI (non-ST elevated myocardial infarction) [facility]) Active Problems:   Essential hypertension   Hyperlipidemia   Tobacco use disorder   Symptomatic anemia   NSTEMI -Patient with substernal chest pressure that has come on with exertion  for the last 2-3 days and resolves with rest. -3/3 typical symptoms suggestive of cardiac chest pain/unstable angina.  -CXR unremarkable.   -Initial cardiac troponin 10 c/w NSTEMI, now decreasing to 9.   -EKG not indicative of acute ischemia.   -TIMI score is 6, risk 40.9%. -GRACE score is 125; which predicts an in-hospital death rate of 1.9%.  -Will admit to [facility] SDU on telemetry to continue to evaluate for ACS.  -cycle troponin q6h x 3 and repeat EKG in AM -Start ASA 81 mg  daily -morphine given -Risk factor stratification with HgbA1c and FLP; will also check TSH -Cardiology consultation upon arrival - NPO for possible stress test/cath -This is a very high-risk patient who would benefit from Heparin therapy.  However, due to concern for active bleeding she needs to undergo a more thorough evaluation and treatment of her anemia first.   -She was previously on Plavix but this has been discontinued. -She is continuing to take Xarelto, last dose yesterday; due to the anemia this medication is being held.  -While this is thought to be an NSTEMI, even with this very elevated troponin it is possible that this is demand  ischemia in the setting of symptomatic anemia. -Drs. Bronson Ing and Crenshaw have discussed this patient with the ER and myself by telephone; cardiology will consult on the patient upon arrival at [facility]. -Finally, in this context - chest pain with radiation to the back; symptomatic anemia with no obvious source of blood loss, and troponin of 10 - a thoracic dissecting aneurysm has to be ruled out and so further imaging was ordered after discussion with the radiologist.  Symptomatic anemia -Patient with prior Hgb almost 12 in 8/18, now with Hgb 7.2 yesterday and 7.0 today -Denies frank bleeding or evidence of occult bleeding -Heme negative -MCV is <80 and so this is microcytic anemia -MCV is < 70 indicating probable iron deficiency -With an MCV <70, elevated RDW, and reason for iron deficiency - iron deficiency can be presumed -Clearly the concern is acute blood loss anemia but there is now obvious source. -Checking CTA to r/o dissection - although this likely would have been seen on CXR with widening of the mediastinum. -Will admit to SDU at [facility]. -Transfuse 3 units PRBC to start (due to concern for ACS) and recheck Hgb afterwards.   -Patient counseled about short- and long-term risks associated with transfusion and consents to receive blood products. -This problem could be the source of her demand ischemia if this is not an NSTEMI.  HTN -She is currently not taking any medication for this at home -Assuming the CT is negative for dissection, will start 12.5 mg Lopressor BID for now - her BP is low normal and so we will need to monitor this closely  HLD -Continue Lipitor but  increase to 40 mg qhs -Lipids were checked in 10/16 (TC 196, HDL 27, LDL 120, TG 246); will repeat  Tobacco dependence -Encourage cessation.  This was discussed with the patient and should be reviewed on an ongoing basis.   -Patch ordered at patient request.   DVT prophylaxis: SCDs Code Status: Full - confirmed with  patient Family Communication: None present Disposition Plan:  Home once clinically improved Consults called: Cardiology  Admission status: Admit - It is my clinical opinion that admission to INPATIENT is reasonable and necessary because this patient will require at least 2 midnights in the hospital to treat this condition based on the medical complexity of the problems presented.  Given the aforementioned information, the predictability of an adverse outcome is felt to be significant.  Total critical care time: 75 minutes Critical care time was exclusive of separately billable procedures and treating other patients. Critical care was necessary to treat or prevent imminent or life-threatening deterioration. Critical care was time spent personally by me on the following activities: development of treatment plan with patient and/or surrogate as well as nursing, discussions with consultants, evaluation of patient's response to treatment, examination of patient, obtaining history from patient or surrogate, ordering and performing treatments and interventions, ordering and review of laboratory studies, ordering and review of radiographic studies, pulse oximetry and re-evaluation of patient's condition.  Karmen Bongo MD [facility]  If note is complete, please contact covering daytime or nighttime physician. [email or web address] Password [facility]  02/13/2017, 6:56 PM

## 2017-02-14 ENCOUNTER — Inpatient Hospital Stay (HOSPITAL_COMMUNITY): Payer: Medicare Other

## 2017-02-14 DIAGNOSIS — E785 Hyperlipidemia, unspecified: Secondary | ICD-10-CM

## 2017-02-14 DIAGNOSIS — E782 Mixed hyperlipidemia: Secondary | ICD-10-CM | POA: Diagnosis not present

## 2017-02-14 DIAGNOSIS — I214 Non-ST elevation (NSTEMI) myocardial infarction: Principal | ICD-10-CM

## 2017-02-14 DIAGNOSIS — R0789 Other chest pain: Secondary | ICD-10-CM

## 2017-02-14 DIAGNOSIS — R079 Chest pain, unspecified: Secondary | ICD-10-CM

## 2017-02-14 DIAGNOSIS — R197 Diarrhea, unspecified: Secondary | ICD-10-CM

## 2017-02-14 DIAGNOSIS — R112 Nausea with vomiting, unspecified: Secondary | ICD-10-CM

## 2017-02-14 DIAGNOSIS — R778 Other specified abnormalities of plasma proteins: Secondary | ICD-10-CM

## 2017-02-14 DIAGNOSIS — R7989 Other specified abnormal findings of blood chemistry: Secondary | ICD-10-CM

## 2017-02-14 LAB — RETICULOCYTES
RBC.: 4.66 MIL/uL (ref 3.87–5.11)
Retic Count, Absolute: 55.9 10*3/uL (ref 19.0–186.0)
Retic Ct Pct: 1.2 % (ref 0.4–3.1)

## 2017-02-14 LAB — LIPID PANEL
Cholesterol: 129 mg/dL (ref 0–200)
HDL: 30 mg/dL — ABNORMAL LOW (ref >40)
LDL Cholesterol: 67 mg/dL (ref 0–99)
Total CHOL/HDL Ratio: 4.3 RATIO
Triglycerides: 162 mg/dL — ABNORMAL HIGH (ref <150)
VLDL: 32 mg/dL (ref 0–40)

## 2017-02-14 LAB — ECHOCARDIOGRAM COMPLETE
Height: 65 in
Weight: 3379.21 oz

## 2017-02-14 LAB — CBC
HCT: 29.9 % — ABNORMAL LOW (ref 36.0–46.0)
Hemoglobin: 8.5 g/dL — ABNORMAL LOW (ref 12.0–15.0)
MCH: 20.6 pg — ABNORMAL LOW (ref 26.0–34.0)
MCHC: 28.4 g/dL — ABNORMAL LOW (ref 30.0–36.0)
MCV: 72.4 fL — ABNORMAL LOW (ref 78.0–100.0)
Platelets: 254 10*3/uL (ref 150–400)
RBC: 4.13 MIL/uL (ref 3.87–5.11)
RDW: 21.2 % — ABNORMAL HIGH (ref 11.5–15.5)
WBC: 7.6 10*3/uL (ref 4.0–10.5)

## 2017-02-14 LAB — OCCULT BLOOD X 1 CARD TO LAB, STOOL
Fecal Occult Bld: NEGATIVE
Fecal Occult Bld: NEGATIVE

## 2017-02-14 LAB — LACTATE DEHYDROGENASE: LDH: 285 U/L — ABNORMAL HIGH (ref 98–192)

## 2017-02-14 LAB — MRSA PCR SCREENING: MRSA by PCR: NEGATIVE

## 2017-02-14 LAB — PROTIME-INR
INR: 1.11
Prothrombin Time: 14.2 seconds (ref 11.4–15.2)

## 2017-02-14 LAB — VITAMIN B12: Vitamin B-12: 360 pg/mL (ref 180–914)

## 2017-02-14 LAB — IRON AND TIBC
Iron: 18 ug/dL — ABNORMAL LOW (ref 28–170)
Saturation Ratios: 4 % — ABNORMAL LOW (ref 10.4–31.8)
TIBC: 477 ug/dL — ABNORMAL HIGH (ref 250–450)
UIBC: 459 ug/dL

## 2017-02-14 LAB — FERRITIN: Ferritin: 7 ng/mL — ABNORMAL LOW (ref 11–307)

## 2017-02-14 LAB — TROPONIN I: Troponin I: 10.1 ng/mL (ref <0.03)

## 2017-02-14 LAB — FOLATE: Folate: 15.5 ng/mL (ref >5.9)

## 2017-02-14 NOTE — Progress Notes (Signed)
Progress Note  Patient Name: Jennifer [name] Date of Encounter: 02/14/2017  Primary Cardiologist: No primary care provider on file. Dr. Raliegh Ip  Subjective   No CP now. Felt burning in her chest from throat down to the belly after throwing up.  She has been feeling weak, short of breath with minimal activity over the last several weeks.  Inpatient Medications    Scheduled Meds: . allopurinol  300 mg Oral Daily  . aspirin EC  81 mg Oral Daily  . atorvastatin  40 mg Oral Daily  . DULoxetine  30 mg Oral Daily  . gabapentin  300 mg Oral BID WC  . gabapentin  600 mg Oral QHS  . pantoprazole  40 mg Intravenous Q12H  . sodium chloride flush  3 mL Intravenous Q12H   Continuous Infusions: . sodium chloride 100 mL/hr at 02/13/17 1512  . sodium chloride    . sodium chloride     PRN Meds: sodium chloride, acetaminophen, famotidine, nitroGLYCERIN, ondansetron (ZOFRAN) IV, oxyCODONE, sodium chloride flush   Vital Signs    Vitals:   02/14/17 0850 02/14/17 1152 02/14/17 1248 02/14/17 1635  BP: 122/60  108/65 124/83  Pulse: (!) 56 (!) 58  69  Resp: 17 (!) 26 16 17   Temp:  (!) 97.5 F (36.4 C)  98.6 F (37 C)  TempSrc:  Oral  Oral  SpO2: 99% 97%  94%  Weight:      Height:        Intake/Output Summary (Last 24 hours) at 02/14/2017 1646 Last data filed at 02/14/2017 1300 Gross per 24 hour  Intake 1327 ml  Output 750 ml  Net 577 ml   Filed Weights   02/13/17 1334 02/13/17 2100  Weight: 219 lb (99.3 kg) 211 lb 3.2 oz (95.8 kg)    Telemetry    No adverse arrhythmias, sinus rhythm- Personally Reviewed  ECG    Sinus rhythm with nonspecific ST-T wave changes- Personally Reviewed  Physical Exam   GEN: No acute distress.   Neck: No JVD Cardiac: RRR, no murmurs, rubs, or gallops.  Respiratory: Clear to auscultation bilaterally. GI: Soft, nontender, non-distended  MS: No edema; No deformity. Neuro:  Nonfocal  Psych: Normal affect   Labs    Chemistry Recent Labs  Lab  02/12/17 2010 02/13/17 1335  NA 139 137  K 3.4* 3.3*  CL 102 102  CO2 24 25  GLUCOSE 107* 99  BUN 5* 10  CREATININE 1.12* 1.02*  CALCIUM 10.0 9.0  PROT 6.4* 6.5  ALBUMIN 3.8 3.8  AST 48* 71*  ALT 12* 16  ALKPHOS 76 72  BILITOT 0.4 0.6  GFRNONAA 50* 56*  GFRAA 58* >60  ANIONGAP 13 10     Hematology Recent Labs  Lab 02/12/17 2010 02/13/17 1335 02/14/17 0841 02/14/17 1052  WBC 9.1 11.2* 7.6  --   RBC 4.08 3.88 4.13 4.66  HGB 7.2* 7.0* 8.5*  --   HCT 28.0* 27.1* 29.9*  --   MCV 68.6* 69.8* 72.4*  --   MCH 17.6* 18.0* 20.6*  --   MCHC 25.7* 25.8* 28.4*  --   RDW 20.2* 20.3* 21.2*  --   PLT 372 374 254  --     Cardiac Enzymes Recent Labs  Lab 02/13/17 1335 02/13/17 1759 02/13/17 2135 02/14/17 0841  TROPONINI 9.96* 8.93* 11.36* 10.10*   No results for input(s): TROPIPOC in the last 168 hours.   BNPNo results for input(s): BNP, PROBNP in the last 168 hours.  DDimer No results for input(s): DDIMER in the last 168 hours.   Radiology    Dg Chest 2 View  Result Date: 02/13/2017 CLINICAL DATA:  emesis x4 days with increased reflux. --- diarrhea that started x2 days ago as well. PT states generalized malaise and poor appetite with body aches for weeks. EXAM: CHEST - 2 VIEW COMPARISON:  CT 01/06/2015 FINDINGS: Lungs are clear. Heart size upper limits normal.  Atheromatous aorta. No effusion.  No pneumothorax. Visualized bones unremarkable. IMPRESSION: No acute cardiopulmonary disease. Aortic Atherosclerosis (ICD10-170.0) Electronically Signed   By: Lucrezia Europe M.D.   On: 02/13/2017 15:52   Ct Abdomen Pelvis W Contrast  Result Date: 02/13/2017 CLINICAL DATA:  Vomiting for several days with decreased appetite EXAM: CT ABDOMEN AND PELVIS WITH CONTRAST TECHNIQUE: Multidetector CT imaging of the abdomen and pelvis was performed using the standard protocol following bolus administration of intravenous contrast. CONTRAST:  151mL ISOVUE-300 IOPAMIDOL (ISOVUE-300) INJECTION 61%  COMPARISON:  04/13/2016 FINDINGS: Lower chest: No acute abnormality. Hepatobiliary: No focal liver abnormality is seen. No gallstones, gallbladder wall thickening, or biliary dilatation. Pancreas: Unremarkable. No pancreatic ductal dilatation or surrounding inflammatory changes. Spleen: Normal in size without focal abnormality. Adrenals/Urinary Tract: The adrenal glands are within normal limits. Solitary left kidney is noted. No renal calculi or obstructive changes are noted. A simple cyst is noted in the upper pole stable from the prior exam. No obstructive changes are noted. The right kidney is severely shrunken and calcified stable from the prior exam. The bladder is well distended. Stomach/Bowel: Scattered diverticular change of the colon is noted. The colon is predominately decompressed. No inflammatory or obstructive changes are seen. The appendix is within normal limits. No obstructive or inflammatory changes in small bowel are seen. Vascular/Lymphatic: Aortic atherosclerosis. No enlarged abdominal or pelvic lymph nodes. Reproductive: Status post hysterectomy. No adnexal masses. Other: No abdominal wall hernia or abnormality. No abdominopelvic ascites. Musculoskeletal: Mild degenerative changes of lumbar spine are noted. No acute abnormality seen. IMPRESSION: Chronic changes as described above stable from the prior exam. No acute abnormality to correspond with the patient's clinical symptomatology is noted. Electronically Signed   By: Inez Catalina M.D.   On: 02/13/2017 15:47   Ct Angio Chest Aorta W/cm &/or Wo/cm  Result Date: 02/13/2017 CLINICAL DATA:  Mid chest pressure today, complex chest pain, elevated troponins, question aortic dissection EXAM: CT ANGIOGRAPHY CHEST WITH CONTRAST TECHNIQUE: Multidetector CT imaging of the chest was performed using the standard protocol during bolus administration of intravenous contrast. Multiplanar CT image reconstructions and MIPs were obtained to evaluate the  vascular anatomy. CONTRAST:  111mL ISOVUE-370 IOPAMIDOL (ISOVUE-370) INJECTION 76% IV COMPARISON:  01/06/2015 FINDINGS: Cardiovascular: Extensive atherosclerotic calcifications aorta, proximal great vessels and coronary arteries. Aorta normal caliber 3.5 cm transverse image 27. No intramural hematoma on precontrast imaging. No para-aortic hemorrhage. Following contrast, normal aortic enhancement is seen without aneurysm or dissection. Heart unremarkable. No pericardial effusion. Pulmonary arteries patent without evidence of pulmonary embolism. Mediastinum/Nodes: Esophagus unremarkable. No thoracic adenopathy. Base of cervical region normal appearance. Lungs/Pleura: Central peribronchial thickening. Lungs clear. No pulmonary infiltrate, pleural effusion or pneumothorax. Upper Abdomen: Tiny cyst upper pole LEFT kidney. Small splenules adjacent to spleen. Adrenal thickening without nodularity. Remaining visualized portions of upper abdomen unremarkable. Musculoskeletal: No acute abnormalities. Endplate spur formation lower thoracic spine. Review of the MIP images confirms the above findings. IMPRESSION: No evidence of aortic aneurysm or dissection. Central bronchitic changes. No acute intrathoracic abnormalities otherwise identified. Extensive atherosclerotic calcifications including in  coronary arteries. Aortic Atherosclerosis (ICD10-I70.0). Electronically Signed   By: Lavonia Dana M.D.   On: 02/13/2017 19:28    Cardiac Studies   Echocardiogram: Ejection fraction 60%.  Mitral annular calcification.  Personally viewed.  Patient Profile     66 y.o. 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  Assessment & Plan    Elevated troponin/CAD/dyspnea on exertion  -Her elevated troponin of 11 is fairly high and seems out of proportion to typical demand ischemia however her echocardiogram was reassuring showing normal  ejection fraction with no obvious wall motion abnormalities and she was quite severely anemic on admission requiring transfusion.  It is possible that this may be a type II MI.  At this point, I would not pursue invasive management.  She cannot take IV heparin.  She is currently on low-dose aspirin.  Agree with atorvastatin.  I think it would be reasonable to have gastroenterology evaluate her.  I wonder if she may have been anemic for quite some time given her history of significant dyspnea on exertion over the past few weeks.  She has been on Xarelto for prior PE/DVT.  Once she is stable from a gastrointestinal standpoint/anemia standpoint, we could consider diagnostic angiogram.  Timing of this will need to be weighed.  -Agree with holding Xarelto.  Prior right carotid stent by Dr. Donnetta Hutching.  Scar noted.  No significant bruits.  Heart is regular rate and rhythm with soft systolic murmur at apex, lungs clear, overweight, moves all extremities, pleasant.  States that she has neuropathy.    For questions or updates, please contact [place] Please consult [email or web address] for contact info under Cardiology/STEMI.      Signed, Candee Furbish, MD  02/14/2017, 4:46 PM

## 2017-02-14 NOTE — Progress Notes (Signed)
Echocardiogram 2D Echocardiogram has been performed.  Jennifer [name] 02/14/2017, 8:51 AM

## 2017-02-14 NOTE — Progress Notes (Signed)
[facility]                                                                              Patient Demographics  Jennifer [name], is a 66 y.o. female, DOB - 11-19-1950, OJJ:[id number]  Admit date - 02/13/2017   Admitting Physician Karmen Bongo, MD  Outpatient Primary MD for the patient is Redmond School, MD  Outpatient specialists:   LOS - 1  days   Medical records reviewed and are as summarized below:    Chief Complaint  Patient presents with  . Emesis       Brief summary   Per admit note by Dr. Lorin Mercy on 12/4 Patient is a 66 year old female with hypertension, hyperlipidemia, DVT, nonobstructive CAD presented with nausea, vomiting, diarrhea and chest pain.  She reported lot of weak spells, nausea vomiting diarrhea in the last 4 days.  She reported the brown looking emesis but no frank hematemesis, hematochezia or melena.  Patient also reported dyspnea on exertion.  Troponin 9.96 on admission.   Assessment & Plan    Principal Problem:   NSTEMI (non-ST elevated myocardial infarction) ([place]) -Currently no chest pain, troponin peaked at 11.3, now trending down -Cardiology was consulted, recommended to continue packed RBC transfusion, hold systemic anticoagulation -2D echocardiogram, continue statin -CT angiogram ruled out aortic dissection   Active Problems:   Symptomatic anemia -Hemoglobin 7.0 on admission, transfused 3 units packed RBCs, stable 8.5 -FOBT negative, patient denies any hematemesis -Obtain anemia panel, reticulocyte count, LDH, haptoglobin     Essential hypertension -Currently stable, placed on low dose beta-blocker 12.5 mg daily    Hyperlipidemia -Continue Lipitor, increased to 40 mg at bedtime    Tobacco use disorder -Patient counseled on tobacco cessation, continue nicotine patch   Code Status: Full CODE STATUS DVT Prophylaxis:   SCD's Family Communication: Discussed in detail with the patient, all imaging results, lab results  explained to the patient   Disposition Plan:   Time Spent in minutes   35 minutes  Procedures:    Consultants:   Cardiology  Antimicrobials:      Medications  Scheduled Meds: . allopurinol  300 mg Oral Daily  . aspirin EC  81 mg Oral Daily  . atorvastatin  40 mg Oral Daily  . DULoxetine  30 mg Oral Daily  . gabapentin  300 mg Oral BID WC  . gabapentin  600 mg Oral QHS  . pantoprazole  40 mg Intravenous Q12H  . sodium chloride flush  3 mL Intravenous Q12H   Continuous Infusions: . sodium chloride 100 mL/hr at 02/13/17 1512  . sodium chloride    . sodium chloride     PRN Meds:.sodium chloride, acetaminophen, famotidine, nitroGLYCERIN, ondansetron (ZOFRAN) IV, oxyCODONE, sodium chloride flush   Antibiotics   Anti-infectives (From admission, onward)   None        Subjective:   Jennifer [name] was seen and examined today.  Denies any specific complaints just feels very weak overall. Patient denies chest pain, shortness of breath, abdominal pain, N/V/D/C, focal weakness, numbess, tingling.  Denies any hematochezia, melena or hematemesis.  Objective:   Vitals:   02/14/17 0548 02/14/17  0753 02/14/17 0758 02/14/17 0850  BP: (!) 123/56  (!) 103/47 122/60  Pulse: (!) 59 (!) 59 61 (!) 56  Resp: 20 19 16 17   Temp: 97.9 F (36.6 C)  98.4 F (36.9 C)   TempSrc: Oral  Oral   SpO2: 95% 98% 99% 99%  Weight:      Height:        Intake/Output Summary (Last 24 hours) at 02/14/2017 1039 Last data filed at 02/14/2017 0548 Gross per 24 hour  Intake 1462 ml  Output 400 ml  Net 1062 ml     Wt Readings from Last 3 Encounters:  02/13/17 95.8 kg (211 lb 3.2 oz)  02/12/17 99.8 kg (220 lb)  01/23/17 99.3 kg (219 lb)     Exam  General: Alert and oriented x 3, NAD  Eyes: PERRLA, EOMI, Anicteric Sclera,  HEENT:  Atraumatic, normocephalic, normal oropharynx  Cardiovascular: S1 S2 auscultated, no rubs, murmurs or gallops. Regular rate and rhythm.  Respiratory: Clear  to auscultation bilaterally, no wheezing, rales or rhonchi  Gastrointestinal: Soft, nontender, nondistended, + bowel sounds  Ext: no pedal edema bilaterally  Neuro: AAOx3, Cr N's II- XII. Strength 5/5 upper and lower extremities bilaterally, speech clear, sensations grossly intact  Musculoskeletal: No digital cyanosis, clubbing  Skin: No rashes  Psych: Normal affect and demeanor, alert and oriented x3    Data Reviewed:  I have personally reviewed following labs and imaging studies  Micro Results Recent Results (from the past 240 hour(s))  MRSA PCR Screening     Status: None   Collection Time: 02/13/17 10:16 PM  Result Value Ref Range Status   MRSA by PCR NEGATIVE NEGATIVE Final    Comment:        The GeneXpert MRSA Assay (FDA approved for NASAL specimens only), is one component of a comprehensive MRSA colonization surveillance program. It is not intended to diagnose MRSA infection nor to guide or monitor treatment for MRSA infections.     Radiology Reports Dg Chest 2 View  Result Date: 02/13/2017 CLINICAL DATA:  emesis x4 days with increased reflux. --- diarrhea that started x2 days ago as well. PT states generalized malaise and poor appetite with body aches for weeks. EXAM: CHEST - 2 VIEW COMPARISON:  CT 01/06/2015 FINDINGS: Lungs are clear. Heart size upper limits normal.  Atheromatous aorta. No effusion.  No pneumothorax. Visualized bones unremarkable. IMPRESSION: No acute cardiopulmonary disease. Aortic Atherosclerosis (ICD10-170.0) Electronically Signed   By: Lucrezia Europe M.D.   On: 02/13/2017 15:52   Ct Abdomen Pelvis W Contrast  Result Date: 02/13/2017 CLINICAL DATA:  Vomiting for several days with decreased appetite EXAM: CT ABDOMEN AND PELVIS WITH CONTRAST TECHNIQUE: Multidetector CT imaging of the abdomen and pelvis was performed using the standard protocol following bolus administration of intravenous contrast. CONTRAST:  178mL ISOVUE-300 IOPAMIDOL (ISOVUE-300)  INJECTION 61% COMPARISON:  04/13/2016 FINDINGS: Lower chest: No acute abnormality. Hepatobiliary: No focal liver abnormality is seen. No gallstones, gallbladder wall thickening, or biliary dilatation. Pancreas: Unremarkable. No pancreatic ductal dilatation or surrounding inflammatory changes. Spleen: Normal in size without focal abnormality. Adrenals/Urinary Tract: The adrenal glands are within normal limits. Solitary left kidney is noted. No renal calculi or obstructive changes are noted. A simple cyst is noted in the upper pole stable from the prior exam. No obstructive changes are noted. The right kidney is severely shrunken and calcified stable from the prior exam. The bladder is well distended. Stomach/Bowel: Scattered diverticular change of the colon is noted. The colon  is predominately decompressed. No inflammatory or obstructive changes are seen. The appendix is within normal limits. No obstructive or inflammatory changes in small bowel are seen. Vascular/Lymphatic: Aortic atherosclerosis. No enlarged abdominal or pelvic lymph nodes. Reproductive: Status post hysterectomy. No adnexal masses. Other: No abdominal wall hernia or abnormality. No abdominopelvic ascites. Musculoskeletal: Mild degenerative changes of lumbar spine are noted. No acute abnormality seen. IMPRESSION: Chronic changes as described above stable from the prior exam. No acute abnormality to correspond with the patient's clinical symptomatology is noted. Electronically Signed   By: Inez Catalina M.D.   On: 02/13/2017 15:47   Ct Angio Chest Aorta W/cm &/or Wo/cm  Result Date: 02/13/2017 CLINICAL DATA:  Mid chest pressure today, complex chest pain, elevated troponins, question aortic dissection EXAM: CT ANGIOGRAPHY CHEST WITH CONTRAST TECHNIQUE: Multidetector CT imaging of the chest was performed using the standard protocol during bolus administration of intravenous contrast. Multiplanar CT image reconstructions and MIPs were obtained to  evaluate the vascular anatomy. CONTRAST:  141mL ISOVUE-370 IOPAMIDOL (ISOVUE-370) INJECTION 76% IV COMPARISON:  01/06/2015 FINDINGS: Cardiovascular: Extensive atherosclerotic calcifications aorta, proximal great vessels and coronary arteries. Aorta normal caliber 3.5 cm transverse image 27. No intramural hematoma on precontrast imaging. No para-aortic hemorrhage. Following contrast, normal aortic enhancement is seen without aneurysm or dissection. Heart unremarkable. No pericardial effusion. Pulmonary arteries patent without evidence of pulmonary embolism. Mediastinum/Nodes: Esophagus unremarkable. No thoracic adenopathy. Base of cervical region normal appearance. Lungs/Pleura: Central peribronchial thickening. Lungs clear. No pulmonary infiltrate, pleural effusion or pneumothorax. Upper Abdomen: Tiny cyst upper pole LEFT kidney. Small splenules adjacent to spleen. Adrenal thickening without nodularity. Remaining visualized portions of upper abdomen unremarkable. Musculoskeletal: No acute abnormalities. Endplate spur formation lower thoracic spine. Review of the MIP images confirms the above findings. IMPRESSION: No evidence of aortic aneurysm or dissection. Central bronchitic changes. No acute intrathoracic abnormalities otherwise identified. Extensive atherosclerotic calcifications including in coronary arteries. Aortic Atherosclerosis (ICD10-I70.0). Electronically Signed   By: Lavonia Dana M.D.   On: 02/13/2017 19:28    Lab Data:  CBC: Recent Labs  Lab 02/12/17 2010 02/13/17 1335 02/14/17 0841  WBC 9.1 11.2* 7.6  NEUTROABS 7.1 8.4*  --   HGB 7.2* 7.0* 8.5*  HCT 28.0* 27.1* 29.9*  MCV 68.6* 69.8* 72.4*  PLT 372 374 PENDING   Basic Metabolic Panel: Recent Labs  Lab 02/12/17 2010 02/13/17 1335  NA 139 137  K 3.4* 3.3*  CL 102 102  CO2 24 25  GLUCOSE 107* 99  BUN 5* 10  CREATININE 1.12* 1.02*  CALCIUM 10.0 9.0   GFR: Estimated Creatinine Clearance: 61.3 mL/min (A) (by C-G formula based  on SCr of 1.02 mg/dL (H)). Liver Function Tests: Recent Labs  Lab 02/12/17 2010 02/13/17 1335  AST 48* 71*  ALT 12* 16  ALKPHOS 76 72  BILITOT 0.4 0.6  PROT 6.4* 6.5  ALBUMIN 3.8 3.8   Recent Labs  Lab 02/13/17 1335  LIPASE 21   No results for input(s): AMMONIA in the last 168 hours. Coagulation Profile: Recent Labs  Lab 02/14/17 0841  INR 1.11   Cardiac Enzymes: Recent Labs  Lab 02/13/17 1335 02/13/17 1759 02/13/17 2135 02/14/17 0841  TROPONINI 9.96* 8.93* 11.36* 10.10*   BNP (last 3 results) No results for input(s): PROBNP in the last 8760 hours. HbA1C: No results for input(s): HGBA1C in the last 72 hours. CBG: No results for input(s): GLUCAP in the last 168 hours. Lipid Profile: Recent Labs    02/14/17 0841  CHOL 129  HDL 30*  LDLCALC 67  TRIG 162*  CHOLHDL 4.3   Thyroid Function Tests: No results for input(s): TSH, T4TOTAL, FREET4, T3FREE, THYROIDAB in the last 72 hours. Anemia Panel: No results for input(s): VITAMINB12, FOLATE, FERRITIN, TIBC, IRON, RETICCTPCT in the last 72 hours. Urine analysis:    Component Value Date/Time   COLORURINE YELLOW 10/26/2016 1139   APPEARANCEUR CLEAR 10/26/2016 1139   LABSPEC 1.013 10/26/2016 1139   PHURINE 5.0 10/26/2016 1139   GLUCOSEU NEGATIVE 10/26/2016 1139   HGBUR NEGATIVE 10/26/2016 [place] 10/26/2016 1139   KETONESUR NEGATIVE 10/26/2016 1139   PROTEINUR NEGATIVE 10/26/2016 1139   UROBILINOGEN 0.2 06/24/2010 1814   NITRITE NEGATIVE 10/26/2016 1139   LEUKOCYTESUR NEGATIVE 10/26/2016 1139     Makye Radle M.D. [facility] 02/14/2017, 10:39 AM  Pager: [phone] Between 7am to 7pm - call Pager - [phone]  After 7pm go to [email or web address] - password [email or web address]  Call night coverage person covering after 7pm

## 2017-02-14 NOTE — Plan of Care (Signed)
  Activity: Risk for activity intolerance will decrease 02/14/2017 2245 - Progressing by Lesle Reek, RN

## 2017-02-15 DIAGNOSIS — R748 Abnormal levels of other serum enzymes: Secondary | ICD-10-CM

## 2017-02-15 DIAGNOSIS — I2510 Atherosclerotic heart disease of native coronary artery without angina pectoris: Secondary | ICD-10-CM

## 2017-02-15 DIAGNOSIS — R0609 Other forms of dyspnea: Secondary | ICD-10-CM

## 2017-02-15 DIAGNOSIS — Z7901 Long term (current) use of anticoagulants: Secondary | ICD-10-CM

## 2017-02-15 DIAGNOSIS — D509 Iron deficiency anemia, unspecified: Secondary | ICD-10-CM

## 2017-02-15 LAB — BASIC METABOLIC PANEL
Anion gap: 5 (ref 5–15)
BUN: 11 mg/dL (ref 6–20)
CO2: 27 mmol/L (ref 22–32)
Calcium: 8.2 mg/dL — ABNORMAL LOW (ref 8.9–10.3)
Chloride: 106 mmol/L (ref 101–111)
Creatinine, Ser: 1.28 mg/dL — ABNORMAL HIGH (ref 0.44–1.00)
GFR calc Af Amer: 49 mL/min — ABNORMAL LOW (ref >60)
GFR calc non Af Amer: 42 mL/min — ABNORMAL LOW (ref >60)
Glucose, Bld: 113 mg/dL — ABNORMAL HIGH (ref 65–99)
Potassium: 3.8 mmol/L (ref 3.5–5.1)
Sodium: 138 mmol/L (ref 135–145)

## 2017-02-15 LAB — TYPE AND SCREEN
ABO/RH(D): A POS
Antibody Screen: NEGATIVE
Unit division: 0
Unit division: 0

## 2017-02-15 LAB — BPAM RBC
Blood Product Expiration Date: 201812242359
Blood Product Expiration Date: 201812252359
ISSUE DATE / TIME: 201812041627
Unit Type and Rh: 6200
Unit Type and Rh: 6200

## 2017-02-15 LAB — CBC
HCT: 28.4 % — ABNORMAL LOW (ref 36.0–46.0)
Hemoglobin: 8.1 g/dL — ABNORMAL LOW (ref 12.0–15.0)
MCH: 20.7 pg — ABNORMAL LOW (ref 26.0–34.0)
MCHC: 28.5 g/dL — ABNORMAL LOW (ref 30.0–36.0)
MCV: 72.6 fL — ABNORMAL LOW (ref 78.0–100.0)
Platelets: 231 10*3/uL (ref 150–400)
RBC: 3.91 MIL/uL (ref 3.87–5.11)
RDW: 21.3 % — ABNORMAL HIGH (ref 11.5–15.5)
WBC: 7.6 10*3/uL (ref 4.0–10.5)

## 2017-02-15 LAB — HAPTOGLOBIN: Haptoglobin: 170 mg/dL (ref 34–200)

## 2017-02-15 MED ORDER — PEG-KCL-NACL-NASULF-NA ASC-C 100 G PO SOLR: 1.0000 | Freq: Once | ORAL | Status: DC

## 2017-02-15 MED ORDER — PEG-KCL-NACL-NASULF-NA ASC-C 100 G PO SOLR
0.5000 | Freq: Once | ORAL | Status: AC
  Administered 2017-02-16: 100 g via ORAL
  Filled 2017-02-15 (×2): qty 1

## 2017-02-15 MED ORDER — PEG-KCL-NACL-NASULF-NA ASC-C 100 G PO SOLR
0.5000 | Freq: Once | ORAL | Status: AC
  Administered 2017-02-15: 100 g via ORAL
  Filled 2017-02-15: qty 1

## 2017-02-15 NOTE — H&P (View-Only) (Signed)
Referring Provider: Dr. Tana Coast Primary Care Physician:  Redmond School, MD Primary Gastroenterologist:  Althia Forts   Reason for Consultation:  Iron deficiency anemia, CGE  HPI: Jennifer [name] is a 66 y.o. female with medical history significant of HTN, HLD, DVT, and nonobstructive CAD presenting with n/v/d and chest pain.  She reports that she has had a lot of weak spells over the past few months but she has had n/v/d for 4 days.  Too weak to stand, walk.  Since she had the stent placed in her neck/carotid, within a month or so she gets really dizzy spells and has to sit down in the floor because she is too weak and shaky.  Eating Rolaids or Tums like candy, trying to get her stomach to calm down but nothing seems to work.  She has not seen blood in her stools and they are not dark.  She did have "brown-looking" emesis on one occasion prior to admission, but denies frank blood.  +chest discomfort for a couple of days and radiating into her back.  Chest discomfort is worse with exertion.  It is better with rest.  +SOB with exertion.  She is finding that she has to go to bed when she is caring for her elderly friend, "I just don't have no energy, no umph, my son's doing all my housework" over the last month.  ED Course:  Hgb 7.2 grams.  Meanwhile troponin 9.96.  Three months ago Hgb was 14.7 grams.  Iron studies c/w IDA.  She has received 3 units PRBC's.  She was on Xarelto for DVT at home, but taht is on hold here.  Says that she was on Plavix for 6 or 8 weeks after her carotid stent placement but that has been discontinued.  Never had colonoscopy in the past.  Denies NSAID use.  Says that she has lost some weight but has not been eating because her appetite has been poor.  Hgb 8.1 grams this AM.  CT angio chest and CT abdomen and pelvis with contrast were unrevealing for cause of anemia and symptoms.  I believe that cardiology is planning to perform a cath at some point and would like her evaluated  from a GI standpoint first.  Past Medical History:  Diagnosis Date  . Anxiety   . Arthritis   . CAD (coronary artery disease)    a. Cath 12/2014 - mild-moderate non-obstructive CAD with 60% mid RCA, 50% mid Cx-distal Cx, 30% distal LAD, 25% D2, EF 55-65%.  . Depression   . Dizziness   . DVT (deep venous thrombosis) (HCC)    on Xarelto  . Dyspnea    W/ EXERTION  . GERD (gastroesophageal reflux disease)   . Headache   . Heart murmur    DX   AT BIRTH  . Hyperlipidemia   . Hypertension   . 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 Neuropathy   . PE (pulmonary embolism)   . Tobacco abuse     Past Surgical History:  Procedure Laterality Date  . ABDOMINAL HYSTERECTOMY  1974  . CARDIAC CATHETERIZATION N/A 01/08/2015   Procedure: Left Heart Cath and Coronary Angiography;  Surgeon: Jolaine Artist, MD;  Location: [facility];  Service: Cardiovascular;  Laterality: N/A;  . CARPAL TUNNEL RELEASE     RIGHT  . CORONARY STENT PLACEMENT Right 1980  . KNEE ARTHROSCOPY W/ MENISCAL REPAIR     LEFT KNEE   FORMED DVT (SURG  TO REMOVE BLOOD CLOT)  . TONSILLECTOMY      Prior to Admission medications   Medication Sig Start Date End Date Taking? Authorizing Provider  allopurinol (ZYLOPRIM) 300 MG tablet Take 300 mg by mouth daily. 12/19/16  Yes [provider]  atorvastatin (LIPITOR) 10 MG tablet Take 1 tablet (10 mg total) by mouth daily. 10/30/16  Yes Fields, Jessy Oto, MD  calcium carbonate (TUMS - DOSED IN MG ELEMENTAL CALCIUM) 500 MG chewable tablet Chew 6 tablets by mouth 3 (three) times daily with meals.   Yes [provider]  Cholecalciferol (VITAMIN D) 2000 units CAPS Take 2,000 Units by mouth daily.   Yes [provider]  DULoxetine (CYMBALTA) 30 MG capsule Take 30 mg by mouth daily.   Yes [provider]  gabapentin (NEURONTIN) 600 MG tablet Take 300-600 mg by mouth See admin instructions. Take 300 mg twice daily IN THE MORNING AND AT  NOON AND  600 mg at bedtime.   Yes [provider]  Menthol-Methyl Salicylate (MUSCLE RUB) 10-15 % CREA Apply 1 application topically 2 (two) times daily as needed for muscle pain.   Yes [provider]  Oxycodone HCl 10 MG TABS Take 1 tablet by mouth 3 (three) times daily as needed (back pain).  10/27/16  Yes [provider]  ranitidine (ZANTAC) 150 MG tablet Take 150 mg by mouth daily as needed for heartburn.   Yes [provider]  rivaroxaban (XARELTO) 20 MG TABS tablet Take 20 mg by mouth daily with supper.   Yes [provider]    Current Facility-Administered Medications  Medication Dose Route Frequency Provider Last Rate Last Dose  . 0.9 %  sodium chloride infusion   Intravenous Continuous Francine Graven, DO 100 mL/hr at 02/13/17 1512    . 0.9 %  sodium chloride infusion  10 mL/hr Intravenous Once Francine Graven, DO      . 0.9 %  sodium chloride infusion  250 mL Intravenous PRN Karmen Bongo, MD      . acetaminophen (TYLENOL) tablet 650 mg  650 mg Oral Q4H PRN Karmen Bongo, MD      . allopurinol (ZYLOPRIM) tablet 300 mg  300 mg Oral Daily Karmen Bongo, MD   300 mg at 02/15/17 1029  . aspirin EC tablet 81 mg  81 mg Oral Daily Karmen Bongo, MD   81 mg at 02/15/17 1030  . atorvastatin (LIPITOR) tablet 40 mg  40 mg Oral Daily Karmen Bongo, MD   40 mg at 02/15/17 1026  . DULoxetine (CYMBALTA) DR capsule 30 mg  30 mg Oral Daily Karmen Bongo, MD   30 mg at 02/15/17 1026  . famotidine (PEPCID) tablet 20 mg  20 mg Oral Daily PRN Karmen Bongo, MD      . gabapentin (NEURONTIN) capsule 300 mg  300 mg Oral BID WC Karmen Bongo, MD   300 mg at 02/15/17 1029  . gabapentin (NEURONTIN) capsule 600 mg  600 mg Oral QHS Karmen Bongo, MD   600 mg at 02/14/17 2131  . nitroGLYCERIN (NITROSTAT) SL tablet 0.4 mg  0.4 mg Sublingual Q5 Min x 3 PRN Karmen Bongo, MD      . ondansetron [facility]) injection 4 mg  4 mg Intravenous Q6H PRN Karmen Bongo, MD      . oxyCODONE (Oxy IR/ROXICODONE) immediate release tablet 10 mg  10 mg Oral TID PRN Karmen Bongo, MD   10 mg at 02/15/17 1028  . pantoprazole (PROTONIX) injection 40 mg  40 mg Intravenous Lillia Mountain, MD   40 mg at 02/15/17 1030  . sodium chloride flush (NS) 0.9 % injection 3 mL  3 mL Intravenous Q12H Karmen Bongo, MD   3 mL at 02/15/17 1030  . sodium chloride flush (NS) 0.9 % injection 3 mL  3 mL Intravenous PRN Karmen Bongo, MD        Allergies as of 02/13/2017 - Review Complete 02/13/2017  Allergen Reaction Noted  . Bee venom Anaphylaxis 06/20/2016    Family History  Problem Relation Age of Onset  . Heart attack Mother   . Heart attack Father   . Heart attack Brother        CABG  . Heart attack Brother        CABG  . Heart attack Brother        CABG  . Ovarian cancer Maternal Grandmother   . Heart disease Maternal Grandfather   . Dementia Paternal Grandmother     Social History   Socioeconomic History  . Marital status: Divorced    Spouse name: Not on file  . Number of children: 2  . Years of education: 38  . Highest education level: Not on file  Social Needs  . Financial resource strain: Not on file  . Food insecurity - worry: Not on file  . Food insecurity - inability: Not on file  . Transportation needs - medical: Not on file  . Transportation needs - non-medical: Not on file  Occupational History  . Occupation: Retired  Tobacco Use  . Smoking status: Current Every Day Smoker    Packs/day: 1.00    Years: 50.00    Pack years: 50.00    Types: Cigarettes    Start date: 06/18/1967  . Smokeless tobacco: Never Used  . Tobacco comment: Down to 1/2 pk per day.   Substance and Sexual Activity  . Alcohol use: No    Alcohol/week: 0.0 oz  . Drug use: No  . Sexual activity: Not on file  Other Topics Concern  . Not on file  Social History Narrative   Lives at home alone.   Right-handed.   2 cups caffeine per day.    Review of  Systems: ROS is O/W negative except as mentioned in HPI.  Physical Exam: Vital signs in last 24 hours: Temp:  [97.5 F (36.4 C)-98.6 F (37 C)] 98.3 F (36.8 C) (12/06 0421) Pulse Rate:  [58-70] 66 (12/05 2344) Resp:  [13-26] 16 (12/06 0421) BP: (106-124)/(47-83) 106/47 (12/06 0421) SpO2:  [94 %-100 %] 98 % (12/06 0421) Last BM Date: 02/14/17 General:  Alert, Well-developed, well-nourished, pleasant and cooperative in NAD Head:  Normocephalic and atraumatic. Eyes:  Sclera clear, no icterus.  Conjunctiva pink. Ears:  Normal auditory acuity. Mouth:  No deformity or lesions.   Lungs:  Clear throughout to auscultation.  No wheezes, crackles, or rhonchi.  Heart:  Regular rate and rhythm; no murmurs, clicks, rubs,  or gallops. Abdomen:  Soft, non-distended.  BS present.  Mild epigastric TTP. Rectal:  Deferred.  Was hemoccult negative and will be performed at the time of colonoscopy.  Msk:  Symmetrical without gross deformities. Pulses:  Normal pulses noted. Extremities:  Without clubbing or edema. Neurologic:  Alert and oriented x 4;  grossly normal neurologically. Skin:  Intact without significant lesions or rashes. Psych:  Alert and cooperative. Normal mood and affect.  Intake/Output from previous day: 12/05 0701 - 12/06 0700 In: 600 [P.O.:600] Out: 1000 [Urine:1000]  Lab Results:  Recent Labs    02/13/17 1335 02/14/17 0841 02/15/17 0402  WBC 11.2* 7.6 7.6  HGB 7.0* 8.5* 8.1*  HCT 27.1* 29.9* 28.4*  PLT 374 254 231   BMET Recent Labs    02/12/17 2010 02/13/17 1335 02/15/17 0402  NA 139 137 138  K 3.4* 3.3* 3.8  CL 102 102 106  CO2 24 25 27   GLUCOSE 107* 99 113*  BUN 5* 10 11  CREATININE 1.12* 1.02* 1.28*  CALCIUM 10.0 9.0 8.2*   LFT Recent Labs    02/13/17 1335  PROT 6.5  ALBUMIN 3.8  AST 71*  ALT 16  ALKPHOS 72  BILITOT 0.6   PT/INR Recent Labs    02/14/17 0841  LABPROT 14.2  INR 1.11   Studies/Results: Dg Chest 2 View  Result Date:  02/13/2017 CLINICAL DATA:  emesis x4 days with increased reflux. --- diarrhea that started x2 days ago as well. PT states generalized malaise and poor appetite with body aches for weeks. EXAM: CHEST - 2 VIEW COMPARISON:  CT 01/06/2015 FINDINGS: Lungs are clear. Heart size upper limits normal.  Atheromatous aorta. No effusion.  No pneumothorax. Visualized bones unremarkable. IMPRESSION: No acute cardiopulmonary disease. Aortic Atherosclerosis (ICD10-170.0) Electronically Signed   By: Lucrezia Europe M.D.   On: 02/13/2017 15:52   Ct Abdomen Pelvis W Contrast  Result Date: 02/13/2017 CLINICAL DATA:  Vomiting for several days with decreased appetite EXAM: CT ABDOMEN AND PELVIS WITH CONTRAST TECHNIQUE: Multidetector CT imaging of the abdomen and pelvis was performed using the standard protocol following bolus administration of intravenous contrast. CONTRAST:  113mL ISOVUE-300 IOPAMIDOL (ISOVUE-300) INJECTION 61% COMPARISON:  04/13/2016 FINDINGS: Lower chest: No acute abnormality. Hepatobiliary: No focal liver abnormality is seen. No gallstones, gallbladder wall thickening, or biliary dilatation. Pancreas: Unremarkable. No pancreatic ductal dilatation or surrounding inflammatory changes. Spleen: Normal in size without focal abnormality. Adrenals/Urinary Tract: The adrenal glands are within normal limits. Solitary left kidney is noted. No renal calculi or obstructive changes are noted. A simple cyst is noted in the upper pole stable from the prior exam. No obstructive changes are noted. The right kidney is severely shrunken and calcified stable from the prior exam. The bladder is well distended. Stomach/Bowel: Scattered diverticular change of the colon is noted. The colon is predominately decompressed. No inflammatory or obstructive changes are seen. The appendix is within normal limits. No obstructive or inflammatory changes in small bowel are seen. Vascular/Lymphatic: Aortic atherosclerosis. No enlarged abdominal or  pelvic lymph nodes. Reproductive: Status post hysterectomy. No adnexal masses. Other: No abdominal wall hernia or abnormality. No abdominopelvic ascites. Musculoskeletal: Mild degenerative changes of lumbar spine are noted. No acute abnormality seen. IMPRESSION: Chronic changes as described above stable from the prior exam. No acute abnormality to correspond with the patient's clinical symptomatology is noted. Electronically Signed   By: Inez Catalina M.D.   On: 02/13/2017 15:47   Ct Angio Chest Aorta W/cm &/or Wo/cm  Result Date: 02/13/2017 CLINICAL DATA:  Mid chest pressure today, complex chest pain, elevated troponins, question aortic dissection EXAM: CT ANGIOGRAPHY CHEST WITH CONTRAST TECHNIQUE: Multidetector CT imaging of the chest was performed using the standard protocol during bolus administration of intravenous contrast. Multiplanar CT image reconstructions and MIPs were obtained to evaluate the vascular anatomy. CONTRAST:  138mL ISOVUE-370 IOPAMIDOL (ISOVUE-370) INJECTION 76% IV COMPARISON:  01/06/2015 FINDINGS: Cardiovascular: Extensive atherosclerotic calcifications aorta, proximal great vessels and coronary arteries. Aorta normal caliber 3.5 cm transverse image 27. No intramural hematoma on precontrast imaging. No para-aortic  hemorrhage. Following contrast, normal aortic enhancement is seen without aneurysm or dissection. Heart unremarkable. No pericardial effusion. Pulmonary arteries patent without evidence of pulmonary embolism. Mediastinum/Nodes: Esophagus unremarkable. No thoracic adenopathy. Base of cervical region normal appearance. Lungs/Pleura: Central peribronchial thickening. Lungs clear. No pulmonary infiltrate, pleural effusion or pneumothorax. Upper Abdomen: Tiny cyst upper pole LEFT kidney. Small splenules adjacent to spleen. Adrenal thickening without nodularity. Remaining visualized portions of upper abdomen unremarkable. Musculoskeletal: No acute abnormalities. Endplate spur  formation lower thoracic spine. Review of the MIP images confirms the above findings. IMPRESSION: No evidence of aortic aneurysm or dissection. Central bronchitic changes. No acute intrathoracic abnormalities otherwise identified. Extensive atherosclerotic calcifications including in coronary arteries. Aortic Atherosclerosis (ICD10-I70.0). Electronically Signed   By: Lavonia Dana M.D.   On: 02/13/2017 19:28   IMPRESSION/PLAN:  *Symptomatic iron deficiency anemia:  Patient with prior Hgb of 14.7 grams on 10/26/16, now with Hgb 7.2 grams on admission.  Iron levels low.  Has received 3 units PRBC's.  Has been feeling very weak.  Denies any evidence of bleeding.  Was hemoccult negative.  Reports one episode of dark colored emesis a few days ago.  Reports a lot of vomiting recently and burning in her chest/esophagus.  Will plan for EGD and colonoscopy on 12/7.  EGD to evaluate for esophagitis possibly induced by vomiting vs ulcer, etc.  Colonoscopy because she has never undergone one in the past.  This way her GI evaluation of IDA can be complete in case she gets cardiac cath with intervention in which she will need antiplatelet agent for several months.  Monitor Hgb and transfuse prn.  Continue daily PPI for now.   Laban Emperor. Zehr  02/15/2017, 10:34 AM  Pager number [phone]  ________________________________________________________________________  Velora Heckler GI MD note:  I personally examined the patient, reviewed the data and agree with the assessment and plan described above.  Hemocult negative but severe, new IDA in woman on xarelto for DVT, recent plavix.  Planning on colonoscopy and EGD tomorrow.   Owens Loffler, MD [facility] Pager [phone]

## 2017-02-15 NOTE — Consult Note (Signed)
Referring Provider: Dr. Tana Coast Primary Care Physician:  Redmond School, MD Primary Gastroenterologist:  Althia Forts   Reason for Consultation:  Iron deficiency anemia, CGE  HPI: Jennifer [name] is a 66 y.o. female with medical history significant of HTN, HLD, DVT, and nonobstructive CAD presenting with n/v/d and chest pain.  She reports that she has had a lot of weak spells over the past few months but she has had n/v/d for 4 days.  Too weak to stand, walk.  Since she had the stent placed in her neck/carotid, within a month or so she gets really dizzy spells and has to sit down in the floor because she is too weak and shaky.  Eating Rolaids or Tums like candy, trying to get her stomach to calm down but nothing seems to work.  She has not seen blood in her stools and they are not dark.  She did have "brown-looking" emesis on one occasion prior to admission, but denies frank blood.  +chest discomfort for a couple of days and radiating into her back.  Chest discomfort is worse with exertion.  It is better with rest.  +SOB with exertion.  She is finding that she has to go to bed when she is caring for her elderly friend, "I just don't have no energy, no umph, my son's doing all my housework" over the last month.  ED Course:  Hgb 7.2 grams.  Meanwhile troponin 9.96.  Three months ago Hgb was 14.7 grams.  Iron studies c/w IDA.  She has received 3 units PRBC's.  She was on Xarelto for DVT at home, but taht is on hold here.  Says that she was on Plavix for 6 or 8 weeks after her carotid stent placement but that has been discontinued.  Never had colonoscopy in the past.  Denies NSAID use.  Says that she has lost some weight but has not been eating because her appetite has been poor.  Hgb 8.1 grams this AM.  CT angio chest and CT abdomen and pelvis with contrast were unrevealing for cause of anemia and symptoms.  I believe that cardiology is planning to perform a cath at some point and would like her evaluated  from a GI standpoint first.  Past Medical History:  Diagnosis Date  . Anxiety   . Arthritis   . CAD (coronary artery disease)    a. Cath 12/2014 - mild-moderate non-obstructive CAD with 60% mid RCA, 50% mid Cx-distal Cx, 30% distal LAD, 25% D2, EF 55-65%.  . Depression   . Dizziness   . DVT (deep venous thrombosis) (HCC)    on Xarelto  . Dyspnea    W/ EXERTION  . GERD (gastroesophageal reflux disease)   . Headache   . Heart murmur    DX   AT BIRTH  . Hyperlipidemia   . Hypertension   . 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 Neuropathy   . PE (pulmonary embolism)   . Tobacco abuse     Past Surgical History:  Procedure Laterality Date  . ABDOMINAL HYSTERECTOMY  1974  . CARDIAC CATHETERIZATION N/A 01/08/2015   Procedure: Left Heart Cath and Coronary Angiography;  Surgeon: Jolaine Artist, MD;  Location: [facility];  Service: Cardiovascular;  Laterality: N/A;  . CARPAL TUNNEL RELEASE     RIGHT  . CORONARY STENT PLACEMENT Right 1980  . KNEE ARTHROSCOPY W/ MENISCAL REPAIR     LEFT KNEE   FORMED DVT (SURG  TO REMOVE BLOOD CLOT)  . TONSILLECTOMY      Prior to Admission medications   Medication Sig Start Date End Date Taking? Authorizing Provider  allopurinol (ZYLOPRIM) 300 MG tablet Take 300 mg by mouth daily. 12/19/16  Yes [provider]  atorvastatin (LIPITOR) 10 MG tablet Take 1 tablet (10 mg total) by mouth daily. 10/30/16  Yes Fields, Jessy Oto, MD  calcium carbonate (TUMS - DOSED IN MG ELEMENTAL CALCIUM) 500 MG chewable tablet Chew 6 tablets by mouth 3 (three) times daily with meals.   Yes [provider]  Cholecalciferol (VITAMIN D) 2000 units CAPS Take 2,000 Units by mouth daily.   Yes [provider]  DULoxetine (CYMBALTA) 30 MG capsule Take 30 mg by mouth daily.   Yes [provider]  gabapentin (NEURONTIN) 600 MG tablet Take 300-600 mg by mouth See admin instructions. Take 300 mg twice daily IN THE MORNING AND AT  NOON AND  600 mg at bedtime.   Yes [provider]  Menthol-Methyl Salicylate (MUSCLE RUB) 10-15 % CREA Apply 1 application topically 2 (two) times daily as needed for muscle pain.   Yes [provider]  Oxycodone HCl 10 MG TABS Take 1 tablet by mouth 3 (three) times daily as needed (back pain).  10/27/16  Yes [provider]  ranitidine (ZANTAC) 150 MG tablet Take 150 mg by mouth daily as needed for heartburn.   Yes [provider]  rivaroxaban (XARELTO) 20 MG TABS tablet Take 20 mg by mouth daily with supper.   Yes [provider]    Current Facility-Administered Medications  Medication Dose Route Frequency Provider Last Rate Last Dose  . 0.9 %  sodium chloride infusion   Intravenous Continuous Francine Graven, DO 100 mL/hr at 02/13/17 1512    . 0.9 %  sodium chloride infusion  10 mL/hr Intravenous Once Francine Graven, DO      . 0.9 %  sodium chloride infusion  250 mL Intravenous PRN Karmen Bongo, MD      . acetaminophen (TYLENOL) tablet 650 mg  650 mg Oral Q4H PRN Karmen Bongo, MD      . allopurinol (ZYLOPRIM) tablet 300 mg  300 mg Oral Daily Karmen Bongo, MD   300 mg at 02/15/17 1029  . aspirin EC tablet 81 mg  81 mg Oral Daily Karmen Bongo, MD   81 mg at 02/15/17 1030  . atorvastatin (LIPITOR) tablet 40 mg  40 mg Oral Daily Karmen Bongo, MD   40 mg at 02/15/17 1026  . DULoxetine (CYMBALTA) DR capsule 30 mg  30 mg Oral Daily Karmen Bongo, MD   30 mg at 02/15/17 1026  . famotidine (PEPCID) tablet 20 mg  20 mg Oral Daily PRN Karmen Bongo, MD      . gabapentin (NEURONTIN) capsule 300 mg  300 mg Oral BID WC Karmen Bongo, MD   300 mg at 02/15/17 1029  . gabapentin (NEURONTIN) capsule 600 mg  600 mg Oral QHS Karmen Bongo, MD   600 mg at 02/14/17 2131  . nitroGLYCERIN (NITROSTAT) SL tablet 0.4 mg  0.4 mg Sublingual Q5 Min x 3 PRN Karmen Bongo, MD      . ondansetron [facility]) injection 4 mg  4 mg Intravenous Q6H PRN Karmen Bongo, MD      . oxyCODONE (Oxy IR/ROXICODONE) immediate release tablet 10 mg  10 mg Oral TID PRN Karmen Bongo, MD   10 mg at 02/15/17 1028  . pantoprazole (PROTONIX) injection 40 mg  40 mg Intravenous Lillia Mountain, MD   40 mg at 02/15/17 1030  . sodium chloride flush (NS) 0.9 % injection 3 mL  3 mL Intravenous Q12H Karmen Bongo, MD   3 mL at 02/15/17 1030  . sodium chloride flush (NS) 0.9 % injection 3 mL  3 mL Intravenous PRN Karmen Bongo, MD        Allergies as of 02/13/2017 - Review Complete 02/13/2017  Allergen Reaction Noted  . Bee venom Anaphylaxis 06/20/2016    Family History  Problem Relation Age of Onset  . Heart attack Mother   . Heart attack Father   . Heart attack Brother        CABG  . Heart attack Brother        CABG  . Heart attack Brother        CABG  . Ovarian cancer Maternal Grandmother   . Heart disease Maternal Grandfather   . Dementia Paternal Grandmother     Social History   Socioeconomic History  . Marital status: Divorced    Spouse name: Not on file  . Number of children: 2  . Years of education: 55  . Highest education level: Not on file  Social Needs  . Financial resource strain: Not on file  . Food insecurity - worry: Not on file  . Food insecurity - inability: Not on file  . Transportation needs - medical: Not on file  . Transportation needs - non-medical: Not on file  Occupational History  . Occupation: Retired  Tobacco Use  . Smoking status: Current Every Day Smoker    Packs/day: 1.00    Years: 50.00    Pack years: 50.00    Types: Cigarettes    Start date: 06/18/1967  . Smokeless tobacco: Never Used  . Tobacco comment: Down to 1/2 pk per day.   Substance and Sexual Activity  . Alcohol use: No    Alcohol/week: 0.0 oz  . Drug use: No  . Sexual activity: Not on file  Other Topics Concern  . Not on file  Social History Narrative   Lives at home alone.   Right-handed.   2 cups caffeine per day.    Review of  Systems: ROS is O/W negative except as mentioned in HPI.  Physical Exam: Vital signs in last 24 hours: Temp:  [97.5 F (36.4 C)-98.6 F (37 C)] 98.3 F (36.8 C) (12/06 0421) Pulse Rate:  [58-70] 66 (12/05 2344) Resp:  [13-26] 16 (12/06 0421) BP: (106-124)/(47-83) 106/47 (12/06 0421) SpO2:  [94 %-100 %] 98 % (12/06 0421) Last BM Date: 02/14/17 General:  Alert, Well-developed, well-nourished, pleasant and cooperative in NAD Head:  Normocephalic and atraumatic. Eyes:  Sclera clear, no icterus.  Conjunctiva pink. Ears:  Normal auditory acuity. Mouth:  No deformity or lesions.   Lungs:  Clear throughout to auscultation.  No wheezes, crackles, or rhonchi.  Heart:  Regular rate and rhythm; no murmurs, clicks, rubs,  or gallops. Abdomen:  Soft, non-distended.  BS present.  Mild epigastric TTP. Rectal:  Deferred.  Was hemoccult negative and will be performed at the time of colonoscopy.  Msk:  Symmetrical without gross deformities. Pulses:  Normal pulses noted. Extremities:  Without clubbing or edema. Neurologic:  Alert and oriented x 4;  grossly normal neurologically. Skin:  Intact without significant lesions or rashes. Psych:  Alert and cooperative. Normal mood and affect.  Intake/Output from previous day: 12/05 0701 - 12/06 0700 In: 600 [P.O.:600] Out: 1000 [Urine:1000]  Lab Results:  Recent Labs    02/13/17 1335 02/14/17 0841 02/15/17 0402  WBC 11.2* 7.6 7.6  HGB 7.0* 8.5* 8.1*  HCT 27.1* 29.9* 28.4*  PLT 374 254 231   BMET Recent Labs    02/12/17 2010 02/13/17 1335 02/15/17 0402  NA 139 137 138  K 3.4* 3.3* 3.8  CL 102 102 106  CO2 24 25 27   GLUCOSE 107* 99 113*  BUN 5* 10 11  CREATININE 1.12* 1.02* 1.28*  CALCIUM 10.0 9.0 8.2*   LFT Recent Labs    02/13/17 1335  PROT 6.5  ALBUMIN 3.8  AST 71*  ALT 16  ALKPHOS 72  BILITOT 0.6   PT/INR Recent Labs    02/14/17 0841  LABPROT 14.2  INR 1.11   Studies/Results: Dg Chest 2 View  Result Date:  02/13/2017 CLINICAL DATA:  emesis x4 days with increased reflux. --- diarrhea that started x2 days ago as well. PT states generalized malaise and poor appetite with body aches for weeks. EXAM: CHEST - 2 VIEW COMPARISON:  CT 01/06/2015 FINDINGS: Lungs are clear. Heart size upper limits normal.  Atheromatous aorta. No effusion.  No pneumothorax. Visualized bones unremarkable. IMPRESSION: No acute cardiopulmonary disease. Aortic Atherosclerosis (ICD10-170.0) Electronically Signed   By: Lucrezia Europe M.D.   On: 02/13/2017 15:52   Ct Abdomen Pelvis W Contrast  Result Date: 02/13/2017 CLINICAL DATA:  Vomiting for several days with decreased appetite EXAM: CT ABDOMEN AND PELVIS WITH CONTRAST TECHNIQUE: Multidetector CT imaging of the abdomen and pelvis was performed using the standard protocol following bolus administration of intravenous contrast. CONTRAST:  125mL ISOVUE-300 IOPAMIDOL (ISOVUE-300) INJECTION 61% COMPARISON:  04/13/2016 FINDINGS: Lower chest: No acute abnormality. Hepatobiliary: No focal liver abnormality is seen. No gallstones, gallbladder wall thickening, or biliary dilatation. Pancreas: Unremarkable. No pancreatic ductal dilatation or surrounding inflammatory changes. Spleen: Normal in size without focal abnormality. Adrenals/Urinary Tract: The adrenal glands are within normal limits. Solitary left kidney is noted. No renal calculi or obstructive changes are noted. A simple cyst is noted in the upper pole stable from the prior exam. No obstructive changes are noted. The right kidney is severely shrunken and calcified stable from the prior exam. The bladder is well distended. Stomach/Bowel: Scattered diverticular change of the colon is noted. The colon is predominately decompressed. No inflammatory or obstructive changes are seen. The appendix is within normal limits. No obstructive or inflammatory changes in small bowel are seen. Vascular/Lymphatic: Aortic atherosclerosis. No enlarged abdominal or  pelvic lymph nodes. Reproductive: Status post hysterectomy. No adnexal masses. Other: No abdominal wall hernia or abnormality. No abdominopelvic ascites. Musculoskeletal: Mild degenerative changes of lumbar spine are noted. No acute abnormality seen. IMPRESSION: Chronic changes as described above stable from the prior exam. No acute abnormality to correspond with the patient's clinical symptomatology is noted. Electronically Signed   By: Inez Catalina M.D.   On: 02/13/2017 15:47   Ct Angio Chest Aorta W/cm &/or Wo/cm  Result Date: 02/13/2017 CLINICAL DATA:  Mid chest pressure today, complex chest pain, elevated troponins, question aortic dissection EXAM: CT ANGIOGRAPHY CHEST WITH CONTRAST TECHNIQUE: Multidetector CT imaging of the chest was performed using the standard protocol during bolus administration of intravenous contrast. Multiplanar CT image reconstructions and MIPs were obtained to evaluate the vascular anatomy. CONTRAST:  152mL ISOVUE-370 IOPAMIDOL (ISOVUE-370) INJECTION 76% IV COMPARISON:  01/06/2015 FINDINGS: Cardiovascular: Extensive atherosclerotic calcifications aorta, proximal great vessels and coronary arteries. Aorta normal caliber 3.5 cm transverse image 27. No intramural hematoma on precontrast imaging. No para-aortic  hemorrhage. Following contrast, normal aortic enhancement is seen without aneurysm or dissection. Heart unremarkable. No pericardial effusion. Pulmonary arteries patent without evidence of pulmonary embolism. Mediastinum/Nodes: Esophagus unremarkable. No thoracic adenopathy. Base of cervical region normal appearance. Lungs/Pleura: Central peribronchial thickening. Lungs clear. No pulmonary infiltrate, pleural effusion or pneumothorax. Upper Abdomen: Tiny cyst upper pole LEFT kidney. Small splenules adjacent to spleen. Adrenal thickening without nodularity. Remaining visualized portions of upper abdomen unremarkable. Musculoskeletal: No acute abnormalities. Endplate spur  formation lower thoracic spine. Review of the MIP images confirms the above findings. IMPRESSION: No evidence of aortic aneurysm or dissection. Central bronchitic changes. No acute intrathoracic abnormalities otherwise identified. Extensive atherosclerotic calcifications including in coronary arteries. Aortic Atherosclerosis (ICD10-I70.0). Electronically Signed   By: Lavonia Dana M.D.   On: 02/13/2017 19:28   IMPRESSION/PLAN:  *Symptomatic iron deficiency anemia:  Patient with prior Hgb of 14.7 grams on 10/26/16, now with Hgb 7.2 grams on admission.  Iron levels low.  Has received 3 units PRBC's.  Has been feeling very weak.  Denies any evidence of bleeding.  Was hemoccult negative.  Reports one episode of dark colored emesis a few days ago.  Reports a lot of vomiting recently and burning in her chest/esophagus.  Will plan for EGD and colonoscopy on 12/7.  EGD to evaluate for esophagitis possibly induced by vomiting vs ulcer, etc.  Colonoscopy because she has never undergone one in the past.  This way her GI evaluation of IDA can be complete in case she gets cardiac cath with intervention in which she will need antiplatelet agent for several months.  Monitor Hgb and transfuse prn.  Continue daily PPI for now.   Laban Emperor. Zehr  02/15/2017, 10:34 AM  Pager number [phone]  ________________________________________________________________________  Velora Heckler GI MD note:  I personally examined the patient, reviewed the data and agree with the assessment and plan described above.  Hemocult negative but severe, new IDA in woman on xarelto for DVT, recent plavix.  Planning on colonoscopy and EGD tomorrow.   Owens Loffler, MD [facility] Pager [phone]

## 2017-02-15 NOTE — Progress Notes (Signed)
Progress Note  Patient Name: Jennifer [name] Date of Encounter: 02/15/2017  Primary Cardiologist: No primary care provider on file. Dr. Raliegh Ip  Subjective   Currently feeling well without any chest discomfort.  Felt burning in her chest from throat down to the belly after throwing up.  She has been feeling weak, short of breath with minimal activity over the last several weeks.  Inpatient Medications    Scheduled Meds: . allopurinol  300 mg Oral Daily  . aspirin EC  81 mg Oral Daily  . atorvastatin  40 mg Oral Daily  . DULoxetine  30 mg Oral Daily  . gabapentin  300 mg Oral BID WC  . gabapentin  600 mg Oral QHS  . pantoprazole  40 mg Intravenous Q12H  . peg 3350 powder  0.5 kit Oral Once   And  . [START ON 02/16/2017] peg 3350 powder  0.5 kit Oral Once  . sodium chloride flush  3 mL Intravenous Q12H   Continuous Infusions: . sodium chloride 100 mL/hr at 02/13/17 1512  . sodium chloride    . sodium chloride     PRN Meds: sodium chloride, acetaminophen, famotidine, nitroGLYCERIN, ondansetron (ZOFRAN) IV, oxyCODONE, sodium chloride flush   Vital Signs    Vitals:   02/14/17 1702 02/14/17 2055 02/14/17 2344 02/15/17 0421  BP: 124/83 (!) 113/50 (!) 124/49 (!) 106/47  Pulse: 70 63 66   Resp: (!) _0 Temp:  97.9 F (36.6 C) 98.2 F (36.8 C) 98.3 F (36.8 C)  TempSrc:  Oral Oral Oral  SpO2:  100% 98% 98%  Weight:      Height:        Intake/Output Summary (Last 24 hours) at 02/15/2017 1452 Last data filed at 02/15/2017 0424 Gross per 24 hour  Intake 360 ml  Output 400 ml  Net -40 ml   Filed Weights   02/13/17 1334 02/13/17 2100  Weight: 219 lb (99.3 kg) 211 lb 3.2 oz (95.8 kg)    Telemetry    No adverse arrhythmias, sinus rhythm, stable- Personally Reviewed  ECG    Sinus rhythm with nonspecific ST-T wave changes- Personally Reviewed  Physical Exam   GEN: Well nourished, well developed, in no acute distress  HEENT: normal  Neck: no JVD, carotid  bruits, or masses Cardiac: RRR; no murmurs, rubs, or gallops,no edema  Respiratory:  clear to auscultation bilaterally, normal work of breathing GI: soft, nontender, nondistended, + BS MS: no deformity or atrophy  Skin: warm and dry, no rash Neuro:  Alert and Oriented x 3, Strength and sensation are intact Psych: euthymic mood, full affect   Labs    Chemistry Recent Labs  Lab 02/12/17 2010 02/13/17 1335 02/15/17 0402  NA 139 137 138  K 3.4* 3.3* 3.8  CL 102 102 106  CO2 _1 GLUCOSE 107* 99 113*  BUN 5* 10 11  CREATININE 1.12* 1.02* 1.28*  CALCIUM 10.0 9.0 8.2*  PROT 6.4* 6.5  --   ALBUMIN 3.8 3.8  --   AST 48* 71*  --   ALT 12* 16  --   ALKPHOS 76 72  --   BILITOT 0.4 0.6  --   GFRNONAA 50* 56* 42*  GFRAA 58* >60 49*  ANIONGAP _2 Hematology Recent Labs  Lab 02/13/17 1335 02/14/17 0841 02/14/17 1052 02/15/17 0402  WBC 11.2* 7.6  --  7.6  RBC 3.88 4.13 4.66 3.91  HGB 7.0* 8.5*  --  8.1*  HCT 27.1* 29.9*  --  28.4*  MCV 69.8* 72.4*  --  72.6*  MCH 18.0* 20.6*  --  20.7*  MCHC 25.8* 28.4*  --  28.5*  RDW 20.3* 21.2*  --  21.3*  PLT 374 254  --  231    Cardiac Enzymes Recent Labs  Lab 02/13/17 1335 02/13/17 1759 02/13/17 2135 02/14/17 0841  TROPONINI 9.96* 8.93* 11.36* 10.10*   No results for input(s): TROPIPOC in the last 168 hours.   BNPNo results for input(s): BNP, PROBNP in the last 168 hours.   DDimer No results for input(s): DDIMER in the last 168 hours.   Radiology    Dg Chest 2 View  Result Date: 02/13/2017 CLINICAL DATA:  emesis x4 days with increased reflux. --- diarrhea that started x2 days ago as well. PT states generalized malaise and poor appetite with body aches for weeks. EXAM: CHEST - 2 VIEW COMPARISON:  CT 01/06/2015 FINDINGS: Lungs are clear. Heart size upper limits normal.  Atheromatous aorta. No effusion.  No pneumothorax. Visualized bones unremarkable. IMPRESSION: No acute cardiopulmonary disease. Aortic  Atherosclerosis (ICD10-170.0) Electronically Signed   By: Lucrezia Europe M.D.   On: 02/13/2017 15:52   Ct Abdomen Pelvis W Contrast  Result Date: 02/13/2017 CLINICAL DATA:  Vomiting for several days with decreased appetite EXAM: CT ABDOMEN AND PELVIS WITH CONTRAST TECHNIQUE: Multidetector CT imaging of the abdomen and pelvis was performed using the standard protocol following bolus administration of intravenous contrast. CONTRAST:  187m ISOVUE-300 IOPAMIDOL (ISOVUE-300) INJECTION 61% COMPARISON:  04/13/2016 FINDINGS: Lower chest: No acute abnormality. Hepatobiliary: No focal liver abnormality is seen. No gallstones, gallbladder wall thickening, or biliary dilatation. Pancreas: Unremarkable. No pancreatic ductal dilatation or surrounding inflammatory changes. Spleen: Normal in size without focal abnormality. Adrenals/Urinary Tract: The adrenal glands are within normal limits. Solitary left kidney is noted. No renal calculi or obstructive changes are noted. A simple cyst is noted in the upper pole stable from the prior exam. No obstructive changes are noted. The right kidney is severely shrunken and calcified stable from the prior exam. The bladder is well distended. Stomach/Bowel: Scattered diverticular change of the colon is noted. The colon is predominately decompressed. No inflammatory or obstructive changes are seen. The appendix is within normal limits. No obstructive or inflammatory changes in small bowel are seen. Vascular/Lymphatic: Aortic atherosclerosis. No enlarged abdominal or pelvic lymph nodes. Reproductive: Status post hysterectomy. No adnexal masses. Other: No abdominal wall hernia or abnormality. No abdominopelvic ascites. Musculoskeletal: Mild degenerative changes of lumbar spine are noted. No acute abnormality seen. IMPRESSION: Chronic changes as described above stable from the prior exam. No acute abnormality to correspond with the patient's clinical symptomatology is noted. Electronically Signed    By: MInez CatalinaM.D.   On: 02/13/2017 15:47   Ct Angio Chest Aorta W/cm &/or Wo/cm  Result Date: 02/13/2017 CLINICAL DATA:  Mid chest pressure today, complex chest pain, elevated troponins, question aortic dissection EXAM: CT ANGIOGRAPHY CHEST WITH CONTRAST TECHNIQUE: Multidetector CT imaging of the chest was performed using the standard protocol during bolus administration of intravenous contrast. Multiplanar CT image reconstructions and MIPs were obtained to evaluate the vascular anatomy. CONTRAST:  1055mISOVUE-370 IOPAMIDOL (ISOVUE-370) INJECTION 76% IV COMPARISON:  01/06/2015 FINDINGS: Cardiovascular: Extensive atherosclerotic calcifications aorta, proximal great vessels and coronary arteries. Aorta normal caliber 3.5 cm transverse image 27. No intramural hematoma on precontrast imaging. No para-aortic hemorrhage. Following contrast, normal aortic enhancement is seen without aneurysm or dissection. Heart unremarkable. No pericardial  effusion. Pulmonary arteries patent without evidence of pulmonary embolism. Mediastinum/Nodes: Esophagus unremarkable. No thoracic adenopathy. Base of cervical region normal appearance. Lungs/Pleura: Central peribronchial thickening. Lungs clear. No pulmonary infiltrate, pleural effusion or pneumothorax. Upper Abdomen: Tiny cyst upper pole LEFT kidney. Small splenules adjacent to spleen. Adrenal thickening without nodularity. Remaining visualized portions of upper abdomen unremarkable. Musculoskeletal: No acute abnormalities. Endplate spur formation lower thoracic spine. Review of the MIP images confirms the above findings. IMPRESSION: No evidence of aortic aneurysm or dissection. Central bronchitic changes. No acute intrathoracic abnormalities otherwise identified. Extensive atherosclerotic calcifications including in coronary arteries. Aortic Atherosclerosis (ICD10-I70.0). Electronically Signed   By: Lavonia Dana M.D.   On: 02/13/2017 19:28    Cardiac Studies    Echocardiogram: Ejection fraction 60%.  Mitral annular calcification.  Personally viewed.  Patient Profile     66 y.o. 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  Assessment & Plan    Elevated troponin/CAD/dyspnea on exertion  -Her elevated troponin of 11 is fairly high and seems out of proportion to typical demand ischemia however her echocardiogram was reassuring showing normal ejection fraction with no obvious wall motion abnormalities and she was quite severely anemic on admission requiring transfusion.  It is possible that this may be a type II MI, demand ischemia.  At this point, I would not pursue invasive management.  She cannot take IV heparin.  She is currently on low-dose aspirin.  Agree with atorvastatin.  GI evaluation noted. I wonder if she may have been anemic for quite some time given her history of significant dyspnea on exertion over the past few weeks.  She has been on Xarelto for prior PE/DVT.  Once she is stable from a gastrointestinal standpoint/anemia standpoint, we could consider diagnostic angiogram.  Timing of this will need to be weighed.  -Agree with holding Xarelto.  Prior right carotid stent by Dr. Donnetta Hutching.  Scar noted.  No significant bruits.  Heart is regular rate and rhythm with soft systolic murmur at apex, lungs clear, overweight, moves all extremities, pleasant.  States that she has neuropathy.  No changes.  -She may proceed with colonoscopy/EGD with moderate risk from a cardiovascular perspective given her elevated troponin however remember her ejection fraction is normal with no wall motion abnormalities.    For questions or updates, please contact [place] Please consult [email or web address] for contact info under Cardiology/STEMI.      Signed, Candee Furbish, MD  02/15/2017, 2:52 PM

## 2017-02-15 NOTE — Progress Notes (Signed)
[facility]                                                                              Patient Demographics  Jennifer [name], is a 66 y.o. female, DOB - [birth year], IRS:[id number]  Admit date - 02/13/2017   Admitting Physician Karmen Bongo, MD  Outpatient Primary MD for the patient is Redmond School, MD  Outpatient specialists:   LOS - 2  days   Medical records reviewed and are as summarized below:    Chief Complaint  Patient presents with  . Emesis       Brief summary   Per admit note by Dr. Lorin Mercy on 12/4 Patient is a 66 year old female with hypertension, hyperlipidemia, DVT, nonobstructive CAD presented with nausea, vomiting, diarrhea and chest pain.  She reported lot of weak spells, nausea vomiting diarrhea in the last 4 days.  She reported the brown looking emesis but no frank hematemesis, hematochezia or melena.  Patient also reported dyspnea on exertion.  Troponin 9.96 on admission.   Assessment & Plan    Principal Problem:   NSTEMI (non-ST elevated myocardial infarction) ([place]) -Currently no chest pain, troponin peaked at 11.3, now trending down -Cardiology was consulted, recommended to continue packed RBC transfusion, hold systemic anticoagulation -CT angiogram ruled out aortic dissection -2D echo showed EF of 60-65%, no regional wall motion abnormal dysfunction   Active Problems:   Symptomatic anemia -Hemoglobin 7.0 on admission, transfused 3 units packed RBCs -FOBT negative, patient denies any hematemesis -H&H now stable, anemia panel showed iron deficiency, one episode of coffee-ground emesis.  GI consulted -On IV Protonix     Essential hypertension -Currently stable, placed on low dose beta-blocker 12.5 mg daily    Hyperlipidemia -Continue Lipitor, increased to 40 mg at bedtime    Tobacco use disorder -Patient counseled on tobacco cessation, continue nicotine patch   Code Status: Full CODE STATUS DVT Prophylaxis:   SCD's Family  Communication: Discussed in detail with the patient, all imaging results, lab results explained to the patient   Disposition Plan:   Time Spent in minutes   25 minutes  Procedures:    Consultants:   Cardiology GI  Antimicrobials:      Medications  Scheduled Meds: . allopurinol  300 mg Oral Daily  . aspirin EC  81 mg Oral Daily  . atorvastatin  40 mg Oral Daily  . DULoxetine  30 mg Oral Daily  . gabapentin  300 mg Oral BID WC  . gabapentin  600 mg Oral QHS  . pantoprazole  40 mg Intravenous Q12H  . sodium chloride flush  3 mL Intravenous Q12H   Continuous Infusions: . sodium chloride 100 mL/hr at 02/13/17 1512  . sodium chloride    . sodium chloride     PRN Meds:.sodium chloride, acetaminophen, famotidine, nitroGLYCERIN, ondansetron (ZOFRAN) IV, oxyCODONE, sodium chloride flush   Antibiotics   Anti-infectives (From admission, onward)   None        Subjective:   [name] was seen and examined today.  Feels a lot better today, denies any chest pain.  No fevers or chills.   Patient denies shortness of breath,  abdominal pain, N/V/D/C, focal weakness, numbess, tingling.  Denies any hematochezia, melena or hematemesis.  One episode of dark emesis prior to admission, none since then  Objective:   Vitals:   02/14/17 1702 02/14/17 2055 02/14/17 2344 02/15/17 0421  BP: 124/83 (!) 113/50 (!) 124/49 (!) 106/47  Pulse: 70 63 66   Resp: (!) 21 13 17 16   Temp:  97.9 F (36.6 C) 98.2 F (36.8 C) 98.3 F (36.8 C)  TempSrc:  Oral Oral Oral  SpO2:  100% 98% 98%  Weight:      Height:        Intake/Output Summary (Last 24 hours) at 02/15/2017 1121 Last data filed at 02/15/2017 0424 Gross per 24 hour  Intake 600 ml  Output 650 ml  Net -50 ml     Wt Readings from Last 3 Encounters:  02/13/17 95.8 kg (211 lb 3.2 oz)  02/12/17 99.8 kg (220 lb)  01/23/17 99.3 kg (219 lb)     Exam   General: Alert and oriented x 3, NAD  Eyes:   HEENT:  Atraumatic,  normocephalic  Cardiovascular: S1 S2 clear regular, rate and rhythm. No pedal edema b/l  Respiratory: Clear to auscultation bilaterally, no wheezing, rales or rhonchi  Gastrointestinal: Soft, nontender, nondistended, + bowel sounds  Ext: no pedal edema bilaterally  Neuro: no new deficits   Musculoskeletal: No digital cyanosis, clubbing  Skin: No rashes  Psych: Normal affect and demeanor, alert and oriented x3    Data Reviewed:  I have personally reviewed following labs and imaging studies  Micro Results Recent Results (from the past 240 hour(s))  MRSA PCR Screening     Status: None   Collection Time: 02/13/17 10:16 PM  Result Value Ref Range Status   MRSA by PCR NEGATIVE NEGATIVE Final    Comment:        The GeneXpert MRSA Assay (FDA approved for NASAL specimens only), is one component of a comprehensive MRSA colonization surveillance program. It is not intended to diagnose MRSA infection nor to guide or monitor treatment for MRSA infections.     Radiology Reports Dg Chest 2 View  Result Date: 02/13/2017 CLINICAL DATA:  emesis x4 days with increased reflux. --- diarrhea that started x2 days ago as well. PT states generalized malaise and poor appetite with body aches for weeks. EXAM: CHEST - 2 VIEW COMPARISON:  CT 01/06/2015 FINDINGS: Lungs are clear. Heart size upper limits normal.  Atheromatous aorta. No effusion.  No pneumothorax. Visualized bones unremarkable. IMPRESSION: No acute cardiopulmonary disease. Aortic Atherosclerosis (ICD10-170.0) Electronically Signed   By: Lucrezia Europe M.D.   On: 02/13/2017 15:52   Ct Abdomen Pelvis W Contrast  Result Date: 02/13/2017 CLINICAL DATA:  Vomiting for several days with decreased appetite EXAM: CT ABDOMEN AND PELVIS WITH CONTRAST TECHNIQUE: Multidetector CT imaging of the abdomen and pelvis was performed using the standard protocol following bolus administration of intravenous contrast. CONTRAST:  157mL ISOVUE-300 IOPAMIDOL  (ISOVUE-300) INJECTION 61% COMPARISON:  04/13/2016 FINDINGS: Lower chest: No acute abnormality. Hepatobiliary: No focal liver abnormality is seen. No gallstones, gallbladder wall thickening, or biliary dilatation. Pancreas: Unremarkable. No pancreatic ductal dilatation or surrounding inflammatory changes. Spleen: Normal in size without focal abnormality. Adrenals/Urinary Tract: The adrenal glands are within normal limits. Solitary left kidney is noted. No renal calculi or obstructive changes are noted. A simple cyst is noted in the upper pole stable from the prior exam. No obstructive changes are noted. The right kidney is severely shrunken and calcified stable  from the prior exam. The bladder is well distended. Stomach/Bowel: Scattered diverticular change of the colon is noted. The colon is predominately decompressed. No inflammatory or obstructive changes are seen. The appendix is within normal limits. No obstructive or inflammatory changes in small bowel are seen. Vascular/Lymphatic: Aortic atherosclerosis. No enlarged abdominal or pelvic lymph nodes. Reproductive: Status post hysterectomy. No adnexal masses. Other: No abdominal wall hernia or abnormality. No abdominopelvic ascites. Musculoskeletal: Mild degenerative changes of lumbar spine are noted. No acute abnormality seen. IMPRESSION: Chronic changes as described above stable from the prior exam. No acute abnormality to correspond with the patient's clinical symptomatology is noted. Electronically Signed   By: Inez Catalina M.D.   On: 02/13/2017 15:47   Ct Angio Chest Aorta W/cm &/or Wo/cm  Result Date: 02/13/2017 CLINICAL DATA:  Mid chest pressure today, complex chest pain, elevated troponins, question aortic dissection EXAM: CT ANGIOGRAPHY CHEST WITH CONTRAST TECHNIQUE: Multidetector CT imaging of the chest was performed using the standard protocol during bolus administration of intravenous contrast. Multiplanar CT image reconstructions and MIPs were  obtained to evaluate the vascular anatomy. CONTRAST:  175mL ISOVUE-370 IOPAMIDOL (ISOVUE-370) INJECTION 76% IV COMPARISON:  01/06/2015 FINDINGS: Cardiovascular: Extensive atherosclerotic calcifications aorta, proximal great vessels and coronary arteries. Aorta normal caliber 3.5 cm transverse image 27. No intramural hematoma on precontrast imaging. No para-aortic hemorrhage. Following contrast, normal aortic enhancement is seen without aneurysm or dissection. Heart unremarkable. No pericardial effusion. Pulmonary arteries patent without evidence of pulmonary embolism. Mediastinum/Nodes: Esophagus unremarkable. No thoracic adenopathy. Base of cervical region normal appearance. Lungs/Pleura: Central peribronchial thickening. Lungs clear. No pulmonary infiltrate, pleural effusion or pneumothorax. Upper Abdomen: Tiny cyst upper pole LEFT kidney. Small splenules adjacent to spleen. Adrenal thickening without nodularity. Remaining visualized portions of upper abdomen unremarkable. Musculoskeletal: No acute abnormalities. Endplate spur formation lower thoracic spine. Review of the MIP images confirms the above findings. IMPRESSION: No evidence of aortic aneurysm or dissection. Central bronchitic changes. No acute intrathoracic abnormalities otherwise identified. Extensive atherosclerotic calcifications including in coronary arteries. Aortic Atherosclerosis (ICD10-I70.0). Electronically Signed   By: Lavonia Dana M.D.   On: 02/13/2017 19:28    Lab Data:  CBC: Recent Labs  Lab 02/12/17 2010 02/13/17 1335 02/14/17 0841 02/15/17 0402  WBC 9.1 11.2* 7.6 7.6  NEUTROABS 7.1 8.4*  --   --   HGB 7.2* 7.0* 8.5* 8.1*  HCT 28.0* 27.1* 29.9* 28.4*  MCV 68.6* 69.8* 72.4* 72.6*  PLT 372 374 254 132   Basic Metabolic Panel: Recent Labs  Lab 02/12/17 2010 02/13/17 1335 02/15/17 0402  NA 139 137 138  K 3.4* 3.3* 3.8  CL 102 102 106  CO2 24 25 27   GLUCOSE 107* 99 113*  BUN 5* 10 11  CREATININE 1.12* 1.02* 1.28*    CALCIUM 10.0 9.0 8.2*   GFR: Estimated Creatinine Clearance: 48.8 mL/min (A) (by C-G formula based on SCr of 1.28 mg/dL (H)). Liver Function Tests: Recent Labs  Lab 02/12/17 2010 02/13/17 1335  AST 48* 71*  ALT 12* 16  ALKPHOS 76 72  BILITOT 0.4 0.6  PROT 6.4* 6.5  ALBUMIN 3.8 3.8   Recent Labs  Lab 02/13/17 1335  LIPASE 21   No results for input(s): AMMONIA in the last 168 hours. Coagulation Profile: Recent Labs  Lab 02/14/17 0841  INR 1.11   Cardiac Enzymes: Recent Labs  Lab 02/13/17 1335 02/13/17 1759 02/13/17 2135 02/14/17 0841  TROPONINI 9.96* 8.93* 11.36* 10.10*   BNP (last 3 results) No results for input(s):  PROBNP in the last 8760 hours. HbA1C: No results for input(s): HGBA1C in the last 72 hours. CBG: No results for input(s): GLUCAP in the last 168 hours. Lipid Profile: Recent Labs    02/14/17 0841  CHOL 129  HDL 30*  LDLCALC 67  TRIG 162*  CHOLHDL 4.3   Thyroid Function Tests: No results for input(s): TSH, T4TOTAL, FREET4, T3FREE, THYROIDAB in the last 72 hours. Anemia Panel: Recent Labs    02/14/17 1052  VITAMINB12 360  FOLATE 15.5  FERRITIN 7*  TIBC 477*  IRON 18*  RETICCTPCT 1.2   Urine analysis:    Component Value Date/Time   COLORURINE YELLOW 10/26/2016 1139   APPEARANCEUR CLEAR 10/26/2016 1139   LABSPEC 1.013 10/26/2016 1139   PHURINE 5.0 10/26/2016 1139   GLUCOSEU NEGATIVE 10/26/2016 1139   HGBUR NEGATIVE 10/26/2016 1139   BILIRUBINUR NEGATIVE 10/26/2016 1139   KETONESUR NEGATIVE 10/26/2016 1139   PROTEINUR NEGATIVE 10/26/2016 1139   UROBILINOGEN 0.2 06/24/2010 1814   NITRITE NEGATIVE 10/26/2016 1139   LEUKOCYTESUR NEGATIVE 10/26/2016 1139     Hulon Ferron M.D. [facility] 02/15/2017, 11:21 AM  Pager: [phone] Between 7am to 7pm - call Pager - [phone]  After 7pm go to [email or web address] - password [email or web address]  Call night coverage person covering after 7pm

## 2017-02-16 ENCOUNTER — Encounter (HOSPITAL_COMMUNITY): Payer: Self-pay | Admitting: Certified Registered"

## 2017-02-16 ENCOUNTER — Encounter (HOSPITAL_COMMUNITY)
Admission: EM | Disposition: A | Discharge status: Home / Self Care | Payer: Self-pay | Source: Home / Self Care | Attending: Internal Medicine

## 2017-02-16 ENCOUNTER — Inpatient Hospital Stay (HOSPITAL_COMMUNITY): Payer: Medicare Other | Admitting: Anesthesiology

## 2017-02-16 DIAGNOSIS — D649 Anemia, unspecified: Secondary | ICD-10-CM

## 2017-02-16 DIAGNOSIS — D122 Benign neoplasm of ascending colon: Secondary | ICD-10-CM

## 2017-02-16 DIAGNOSIS — R195 Other fecal abnormalities: Secondary | ICD-10-CM

## 2017-02-16 DIAGNOSIS — K921 Melena: Secondary | ICD-10-CM

## 2017-02-16 DIAGNOSIS — D125 Benign neoplasm of sigmoid colon: Secondary | ICD-10-CM

## 2017-02-16 DIAGNOSIS — I10 Essential (primary) hypertension: Secondary | ICD-10-CM

## 2017-02-16 HISTORY — PX: ESOPHAGOGASTRODUODENOSCOPY (EGD) WITH PROPOFOL: SHX5813

## 2017-02-16 HISTORY — PX: COLONOSCOPY WITH PROPOFOL: SHX5780

## 2017-02-16 LAB — CBC
HCT: 28.2 % — ABNORMAL LOW (ref 36.0–46.0)
Hemoglobin: 8 g/dL — ABNORMAL LOW (ref 12.0–15.0)
MCH: 21 pg — ABNORMAL LOW (ref 26.0–34.0)
MCHC: 28.4 g/dL — ABNORMAL LOW (ref 30.0–36.0)
MCV: 74 fL — ABNORMAL LOW (ref 78.0–100.0)
Platelets: 242 10*3/uL (ref 150–400)
RBC: 3.81 MIL/uL — ABNORMAL LOW (ref 3.87–5.11)
RDW: 22 % — ABNORMAL HIGH (ref 11.5–15.5)
WBC: 7.7 10*3/uL (ref 4.0–10.5)

## 2017-02-16 LAB — BASIC METABOLIC PANEL
Anion gap: 7 (ref 5–15)
BUN: 6 mg/dL (ref 6–20)
CO2: 26 mmol/L (ref 22–32)
Calcium: 8.3 mg/dL — ABNORMAL LOW (ref 8.9–10.3)
Chloride: 106 mmol/L (ref 101–111)
Creatinine, Ser: 1.16 mg/dL — ABNORMAL HIGH (ref 0.44–1.00)
GFR calc Af Amer: 55 mL/min — ABNORMAL LOW (ref >60)
GFR calc non Af Amer: 48 mL/min — ABNORMAL LOW (ref >60)
Glucose, Bld: 90 mg/dL (ref 65–99)
Potassium: 3.9 mmol/L (ref 3.5–5.1)
Sodium: 139 mmol/L (ref 135–145)

## 2017-02-16 LAB — PREPARE RBC (CROSSMATCH)

## 2017-02-16 SURGERY — ESOPHAGOGASTRODUODENOSCOPY (EGD) WITH PROPOFOL
Anesthesia: Monitor Anesthesia Care

## 2017-02-16 MED ORDER — PROPOFOL 10 MG/ML IV BOLUS
INTRAVENOUS | Status: DC | PRN
  Administered 2017-02-16 (×3): 20 mg via INTRAVENOUS

## 2017-02-16 MED ORDER — SODIUM CHLORIDE 0.9 % IV SOLN: INTRAVENOUS | Status: DC

## 2017-02-16 MED ORDER — SPOT INK MARKER SYRINGE KIT
PACK | SUBMUCOSAL | Status: DC | PRN
  Administered 2017-02-16: 1 mL via SUBMUCOSAL

## 2017-02-16 MED ORDER — FLEET ENEMA 7-19 GM/118ML RE ENEM
ENEMA | RECTAL | Status: AC
  Filled 2017-02-16: qty 2

## 2017-02-16 MED ORDER — SODIUM CHLORIDE 0.9 % IV SOLN
Freq: Once | INTRAVENOUS | Status: AC
  Administered 2017-02-16: 13:00:00 via INTRAVENOUS

## 2017-02-16 MED ORDER — PROPOFOL 500 MG/50ML IV EMUL
INTRAVENOUS | Status: DC | PRN
Start: 2017-02-16 — End: 2017-02-16
  Administered 2017-02-16: 100 ug/kg/min via INTRAVENOUS

## 2017-02-16 MED ORDER — SPOT INK MARKER SYRINGE KIT
PACK | SUBMUCOSAL | Status: AC
  Filled 2017-02-16: qty 5

## 2017-02-16 MED ORDER — FLEET ENEMA 7-19 GM/118ML RE ENEM
ENEMA | RECTAL | Status: DC | PRN
  Administered 2017-02-16 (×2): 1 via RECTAL

## 2017-02-16 MED ORDER — PANTOPRAZOLE SODIUM 40 MG PO TBEC
40.0000 mg | DELAYED_RELEASE_TABLET | Freq: Every day | ORAL | Status: DC
  Administered 2017-02-16 – 2017-02-21 (×6): 40 mg via ORAL
  Filled 2017-02-16 (×6): qty 1

## 2017-02-16 MED ORDER — SODIUM CHLORIDE 0.9 % IV SOLN
510.0000 mg | Freq: Once | INTRAVENOUS | Status: AC
  Administered 2017-02-16: 510 mg via INTRAVENOUS
  Filled 2017-02-16: qty 17

## 2017-02-16 MED ORDER — LACTATED RINGERS IV SOLN
INTRAVENOUS | Status: AC | PRN
  Administered 2017-02-16: 1000 mL via INTRAVENOUS

## 2017-02-16 MED ORDER — GLYCOPYRROLATE 0.2 MG/ML IJ SOLN
INTRAMUSCULAR | Status: DC | PRN
  Administered 2017-02-16: 0.2 mg via INTRAVENOUS

## 2017-02-16 MED ORDER — EPHEDRINE SULFATE-NACL 50-0.9 MG/10ML-% IV SOSY
PREFILLED_SYRINGE | INTRAVENOUS | Status: DC | PRN
  Administered 2017-02-16: 5 mg via INTRAVENOUS

## 2017-02-16 SURGICAL SUPPLY — 25 items

## 2017-02-16 NOTE — Progress Notes (Addendum)
Progress Note  Patient Name: Jennifer [name] Date of Encounter: 02/16/2017  Primary Cardiologist: No primary care provider on file. Dr. Rich Fuchs  Subjective   No recent chest discomfort.  Just underwent colonoscopy and EGD.  Somewhat tired.  Reassuring studies other than sigmoid lesion that was bx, awaiting path.  Inpatient Medications    Scheduled Meds: . allopurinol  300 mg Oral Daily  . aspirin EC  81 mg Oral Daily  . atorvastatin  40 mg Oral Daily  . DULoxetine  30 mg Oral Daily  . gabapentin  300 mg Oral BID WC  . gabapentin  600 mg Oral QHS  . pantoprazole  40 mg Oral Q0600  . sodium chloride flush  3 mL Intravenous Q12H   Continuous Infusions: . sodium chloride 100 mL/hr at 02/13/17 1512  . sodium chloride    . sodium chloride     PRN Meds: sodium chloride, acetaminophen, nitroGLYCERIN, ondansetron (ZOFRAN) IV, oxyCODONE, sodium chloride flush   Vital Signs    Vitals:   02/16/17 0715 02/16/17 0820 02/16/17 0836 02/16/17 0855  BP: (!) 190/75 (!) 104/52 (!) 123/57 140/66  Pulse:  63  85  Resp: 14 15    Temp: 98.1 F (36.7 C) 97.7 F (36.5 C)  97.8 F (36.6 C)  TempSrc: Oral Oral  Oral  SpO2:  100%    Weight: 211 lb (95.7 kg)     Height: 5\' 5"  (1.651 m)       Intake/Output Summary (Last 24 hours) at 02/16/2017 1007 Last data filed at 02/16/2017 0824 Gross per 24 hour  Intake 335.91 ml  Output -  Net 335.91 ml   Filed Weights   02/13/17 1334 02/13/17 2100 02/16/17 0715  Weight: 219 lb (99.3 kg) 211 lb 3.2 oz (95.8 kg) 211 lb (95.7 kg)    Telemetry    No adverse arrhythmias, sinus rhythm, no change- Personally Reviewed  ECG    Sinus rhythm with nonspecific ST-T wave changes- Personally Reviewed  Physical Exam   GEN: Well nourished, well developed, in no acute distress  HEENT: normal  Neck: no JVD, carotid bruits, or masses Cardiac: RRR; no murmurs, rubs, or gallops,no edema  Respiratory:  clear to auscultation bilaterally, normal work of  breathing GI: soft, nontender, nondistended, + BS MS: no deformity or atrophy  Skin: warm and dry, no rash, pale Neuro:  Alert and Oriented x 3, Strength and sensation are intact Psych: euthymic mood, full affect   Labs    Chemistry Recent Labs  Lab 02/12/17 2010 02/13/17 1335 02/15/17 0402 02/16/17 0156  NA 139 137 138 139  K 3.4* 3.3* 3.8 3.9  CL 102 102 106 106  CO2 24 25 27 26   GLUCOSE 107* 99 113* 90  BUN 5* 10 11 6   CREATININE 1.12* 1.02* 1.28* 1.16*  CALCIUM 10.0 9.0 8.2* 8.3*  PROT 6.4* 6.5  --   --   ALBUMIN 3.8 3.8  --   --   AST 48* 71*  --   --   ALT 12* 16  --   --   ALKPHOS 76 72  --   --   BILITOT 0.4 0.6  --   --   GFRNONAA 50* 56* 42* 48*  GFRAA 58* >60 49* 55*  ANIONGAP 13 10 5 7      Hematology Recent Labs  Lab 02/14/17 0841 02/14/17 1052 02/15/17 0402 02/16/17 0156  WBC 7.6  --  7.6 7.7  RBC 4.13 4.66 3.91 3.81*  HGB  8.5*  --  8.1* 8.0*  HCT 29.9*  --  28.4* 28.2*  MCV 72.4*  --  72.6* 74.0*  MCH 20.6*  --  20.7* 21.0*  MCHC 28.4*  --  28.5* 28.4*  RDW 21.2*  --  21.3* 22.0*  PLT 254  --  231 242    Cardiac Enzymes Recent Labs  Lab 02/13/17 1335 02/13/17 1759 02/13/17 2135 02/14/17 0841  TROPONINI 9.96* 8.93* 11.36* 10.10*   No results for input(s): TROPIPOC in the last 168 hours.   BNPNo results for input(s): BNP, PROBNP in the last 168 hours.   DDimer No results for input(s): DDIMER in the last 168 hours.   Radiology    No results found.  Cardiac Studies   Echocardiogram: Ejection fraction 60%.  Mitral annular calcification.  Personally viewed.  Patient Profile     66 y.o. female with elevated troponin, severe iron def anemia with history of moderate CAD on Xarelto for prior history of DVT/PE with recent vomiting, diarrhea. EGD and colon negative  Assessment & Plan    Elevated troponin/CAD/dyspnea on exertion/demand ischemia  -Once again, troponin of 11 seems out of proportion to usual value seen in typical demand  ischemia and this likely represents a type II MI potentially.  -I would like to proceed with cardiac catheterization.  Thankfully there is no evidence of EGD or colonoscopy findings that are significant for active bleeding. Awaiting path report from sigmoid bx. Discussed with Dr. Ardis Hughs.   -Her hemoglobin has drifted down to 8.  It would be nice to have another 2 units of blood transfused prior to heart catheterization.  She is just undergone sedation as well.  We will plan on heart catheterization Monday if she remains stable.  History of pulmonary embolism/DVT  -Has been on Xarelto at home.  Obviously this is on hold given her severe anemia.  -GI workup has been unremarkable for EGD and colonoscopy.  Iron deficiency anemia MCV low, ferritin low-however EGD and colonoscopy normal.  It still makes you wonder if there is a lesion in her small intestines perhaps.  Nonetheless, we are reassured by her current findings.  Transfusing.  Plan on heart catheterization Monday.  Carotid artery disease -Right-sided endarterectomy, Dr. Donnetta Hutching.  Stable.   For questions or updates, please contact [facility] Please consult [email or web address] for contact info under Cardiology/STEMI.      Signed, Candee Furbish, MD  02/16/2017, 10:07 AM

## 2017-02-16 NOTE — Op Note (Addendum)
[facility] Patient Name: Jennifer [name] Procedure Date : 02/16/2017 MRN: [id number] Attending MD: [name] , MD Date of Birth: 04-Aug-1950 CSN: [id number] Age: 66 Admit Type: Inpatient Procedure:                Colonoscopy Indications:              Heme positive stool, anemia Providers:                [name], MD, Cleda Daub, RN, Cherylynn Ridges, Technician, Claybon Jabs CRNA, CRNA Referring MD:              Medicines:                Monitored Anesthesia Care Complications:            No immediate complications. Estimated blood loss:                            None. Estimated Blood Loss:     Estimated blood loss: none. Procedure:                Pre-Anesthesia Assessment:                           - Prior to the procedure, a History and Physical                            was performed, and patient medications and                            allergies were reviewed. The patient's tolerance of                            previous anesthesia was also reviewed. The risks                            and benefits of the procedure and the sedation                            options and risks were discussed with the patient.                            All questions were answered, and informed consent                            was obtained. Prior Anticoagulants: The patient has                            taken Xarelto (rivaroxaban), last dose was 2 days                            prior to procedure. ASA Grade Assessment: III - A  patient with severe systemic disease. After                            reviewing the risks and benefits, the patient was                            deemed in satisfactory condition to undergo the                            procedure.                           After obtaining informed consent, the colonoscope                            was passed under direct vision. Throughout the                             procedure, the patient's blood pressure, pulse, and                            oxygen saturations were monitored                            continuously.After obtaining informed consent, the                            colonoscope was passed under direct vision.                            Throughout the procedure, the patient's blood                            pressure, pulse, and oxygen saturations were                            monitored continuously. The colonoscopy was                            performed without difficulty. The patient tolerated                            the procedure well. The quality of the bowel                            preparation was good. The ileocecal valve,                            appendiceal orifice, and rectum were photographed.                            The EC-[id number] ([id number]) scope was introduced                            through the anus and advanced to the the  cecum,                            identified by appendiceal orifice and ileocecal                            valve. Scope In: 7:41:00 AM Scope Out: 8:02:06 AM Scope Withdrawal Time: 0 hours 16 minutes 58 seconds  Total Procedure Duration: 0 hours 21 minutes 6 seconds  Findings:      A 3 mm polyp was found in the ascending colon. The polyp was sessile.       The polyp was removed with a cold snare. Resection and retrieval were       complete.      A 9 mm polyp was found in the sigmoid colon (30cm from the anus). The       polyp was semi-pedunculated and somewhat firm. The polyp was removed       with a hot snare. Resection and retrieval were complete. To prevent       bleeding after the polypectomy, two hemostatic clips were successfully       placed (MR conditional). Area was tattooed with an injection of [place]       ink.      Multiple small-mouthed diverticula were found in the left colon.      The exam was otherwise without abnormality on direct and retroflexion        views. Impression:               - One 3 mm polyp in the ascending colon, removed                            with a cold snare. Resected and retrieved.                           - One 9 mm polyp in the sigmoid colon, removed with                            a hot snare. Resected and retrieved. Clips (MR                            conditional) were placed. This polyp was somewhat                            firm, slightly concerning for advanced neoplasia                            and so I labeled thie site with [place] Ink tattoo.                           - Diverticulosis in the left colon.                           - The examination was otherwise normal on direct                            and retroflexion views. Moderate Sedation:  none Recommendation:           - EGD now. Procedure Code(s):        --- Professional ---                           [phone], Colonoscopy, flexible; with removal of                            tumor(s), polyp(s), or other lesion(s) by snare                            technique                           [id number], Colonoscopy, flexible; with directed                            submucosal injection(s), any substance Diagnosis Code(s):        --- Professional ---                           D12.2, Benign neoplasm of ascending colon                           D12.5, Benign neoplasm of sigmoid colon                           R19.5, Other fecal abnormalities                           K57.30, Diverticulosis of large intestine without                            perforation or abscess without bleeding CPT copyright 2016 [employer]. All rights reserved. The codes documented in this report are preliminary and upon coder review may  be revised to meet current compliance requirements. [name], MD 02/16/2017 8:21:41 AM This report has been signed electronically. Number of Addenda: 0

## 2017-02-16 NOTE — Interval H&P Note (Signed)
History and Physical Interval Note:  02/16/2017 7:12 AM  Jennifer [name]  has presented today for surgery, with the diagnosis of IDA and coffee ground emesis  The various methods of treatment have been discussed with the patient and family. After consideration of risks, benefits and other options for treatment, the patient has consented to  Procedure(s): ESOPHAGOGASTRODUODENOSCOPY (EGD) WITH PROPOFOL (N/A) COLONOSCOPY WITH PROPOFOL (N/A) as a surgical intervention .  The patient's history has been reviewed, patient examined, no change in status, stable for surgery.  I have reviewed the patient's chart and labs.  Questions were answered to the patient's satisfaction.     [name]

## 2017-02-16 NOTE — Progress Notes (Signed)
[facility]                                                                              Patient Demographics  Jennifer [name], is a 66 y.o. female, DOB - 1950/04/30, QIH:[id number]  Admit date - 02/13/2017   Admitting Physician Karmen Bongo, MD  Outpatient Primary MD for the patient is Redmond School, MD  Outpatient specialists:   LOS - 3  days   Medical records reviewed and are as summarized below:    Chief Complaint  Patient presents with  . Emesis       Brief summary   Per admit note by Dr. Lorin Mercy on 12/4 Patient is a 66 year old female with hypertension, hyperlipidemia, DVT, nonobstructive CAD presented with nausea, vomiting, diarrhea and chest pain.  She reported lot of weak spells, nausea vomiting diarrhea in the last 4 days.  She reported the brown looking emesis but no frank hematemesis, hematochezia or melena.  Patient also reported dyspnea on exertion.  Troponin 9.96 on admission.   Assessment & Plan    Principal Problem:   NSTEMI (non-ST elevated myocardial infarction) ([place]) -Currently no chest pain, troponin peaked at 11.3, now trending down -Cardiology was consulted, recommended to continue packed RBC transfusion, hold systemic anticoagulation -CT angiogram ruled out aortic dissection -2D echo showed EF of 60-65%, no regional wall motion abnormal dysfunction -Further management per cardiology, cleared by GI, possible cardiac cath on Monday.  Active Problems:   Symptomatic anemia -Hemoglobin 7.0 on admission, transfused 3 units packed RBCs -FOBT negative, patient denies any hematemesis -H&H now stable, anemia panel showed iron deficiency, one episode of coffee-ground emesis.  GI was consulted.  Patient was placed on IV PPI -Patient underwent EGD, colonoscopy reassuring, no lesions -Cardiology recommended transfusion 2 more units in preparation for cardiac cath - will also give 1 dose of Feraheme     Essential hypertension -Currently  stable, placed on low dose beta-blocker 12.5 mg daily    Hyperlipidemia -Continue Lipitor 40 mg at bedtime    Tobacco use disorder -Patient counseled on tobacco cessation, continue nicotine patch   Code Status: Full CODE STATUS DVT Prophylaxis:   SCD's Family Communication: Discussed in detail with the patient, all imaging results, lab results explained to the patient   Disposition Plan:   Time Spent in minutes   25 minutes  Procedures:  EGD: Normal esophagus, stomach, duodenum Colonoscopy: 1 3 mm polyp in the ascending colon, resected, one 9 mm polyp in the sigmoid colon, diverticulosis in the left colon otherwise normal  Consultants:   Cardiology GI  Antimicrobials:      Medications  Scheduled Meds: . allopurinol  300 mg Oral Daily  . aspirin EC  81 mg Oral Daily  . atorvastatin  40 mg Oral Daily  . DULoxetine  30 mg Oral Daily  . gabapentin  300 mg Oral BID WC  . gabapentin  600 mg Oral QHS  . pantoprazole  40 mg Oral Q0600  . sodium chloride flush  3 mL Intravenous Q12H   Continuous Infusions: . sodium chloride 100 mL/hr at 02/13/17 1512  . sodium chloride    . sodium  chloride    . sodium chloride    . ferumoxytol     PRN Meds:.sodium chloride, acetaminophen, nitroGLYCERIN, ondansetron (ZOFRAN) IV, oxyCODONE, sodium chloride flush   Antibiotics   Anti-infectives (From admission, onward)   None        Subjective:   Jennifer [name] was seen and examined today.  Tired and exhausted after 2 procedures today otherwise no acute chest pain or shortness of breath.  No fevers.  Patient denies abdominal pain, N/V/D/C, focal weakness, numbess, tingling.  No GI bleeding overnight.  Objective:   Vitals:   02/16/17 0715 02/16/17 0820 02/16/17 0836 02/16/17 0855  BP: (!) 190/75 (!) 104/52 (!) 123/57 140/66  Pulse:  63  85  Resp: 14 15    Temp: 98.1 F (36.7 C) 97.7 F (36.5 C)  97.8 F (36.6 C)  TempSrc: Oral Oral  Oral  SpO2:  100%    Weight: 95.7 kg  (211 lb)     Height: 5\' 5"  (1.651 m)       Intake/Output Summary (Last 24 hours) at 02/16/2017 1134 Last data filed at 02/16/2017 0824 Gross per 24 hour  Intake 335.91 ml  Output -  Net 335.91 ml     Wt Readings from Last 3 Encounters:  02/16/17 95.7 kg (211 lb)  02/12/17 99.8 kg (220 lb)  01/23/17 99.3 kg (219 lb)     Exam    General: Alert and oriented x 3, NAD  Eyes:   HEENT:  Atraumatic, normocephalic  Cardiovascular: S1 S2 auscultated, no rubs, murmurs or gallops. Regular rate and rhythm. No pedal edema b/l  Respiratory: Clear to auscultation bilaterally, no wheezing, rales or rhonchi  Gastrointestinal: Soft, nontender, nondistended, + bowel sounds  Ext: no pedal edema bilaterally  Neuro: no new deficits  Musculoskeletal: No digital cyanosis, clubbing  Skin: No rashes  Psych: Normal affect and demeanor, alert and oriented x3    Data Reviewed:  I have personally reviewed following labs and imaging studies  Micro Results Recent Results (from the past 240 hour(s))  MRSA PCR Screening     Status: None   Collection Time: 02/13/17 10:16 PM  Result Value Ref Range Status   MRSA by PCR NEGATIVE NEGATIVE Final    Comment:        The GeneXpert MRSA Assay (FDA approved for NASAL specimens only), is one component of a comprehensive MRSA colonization surveillance program. It is not intended to diagnose MRSA infection nor to guide or monitor treatment for MRSA infections.     Radiology Reports Dg Chest 2 View  Result Date: 02/13/2017 CLINICAL DATA:  emesis x4 days with increased reflux. --- diarrhea that started x2 days ago as well. PT states generalized malaise and poor appetite with body aches for weeks. EXAM: CHEST - 2 VIEW COMPARISON:  CT 01/06/2015 FINDINGS: Lungs are clear. Heart size upper limits normal.  Atheromatous aorta. No effusion.  No pneumothorax. Visualized bones unremarkable. IMPRESSION: No acute cardiopulmonary disease. Aortic  Atherosclerosis (ICD10-170.0) Electronically Signed   By: Lucrezia Europe M.D.   On: 02/13/2017 15:52   Ct Abdomen Pelvis W Contrast  Result Date: 02/13/2017 CLINICAL DATA:  Vomiting for several days with decreased appetite EXAM: CT ABDOMEN AND PELVIS WITH CONTRAST TECHNIQUE: Multidetector CT imaging of the abdomen and pelvis was performed using the standard protocol following bolus administration of intravenous contrast. CONTRAST:  12mL ISOVUE-300 IOPAMIDOL (ISOVUE-300) INJECTION 61% COMPARISON:  04/13/2016 FINDINGS: Lower chest: No acute abnormality. Hepatobiliary: No focal liver abnormality is seen. No gallstones,  gallbladder wall thickening, or biliary dilatation. Pancreas: Unremarkable. No pancreatic ductal dilatation or surrounding inflammatory changes. Spleen: Normal in size without focal abnormality. Adrenals/Urinary Tract: The adrenal glands are within normal limits. Solitary left kidney is noted. No renal calculi or obstructive changes are noted. A simple cyst is noted in the upper pole stable from the prior exam. No obstructive changes are noted. The right kidney is severely shrunken and calcified stable from the prior exam. The bladder is well distended. Stomach/Bowel: Scattered diverticular change of the colon is noted. The colon is predominately decompressed. No inflammatory or obstructive changes are seen. The appendix is within normal limits. No obstructive or inflammatory changes in small bowel are seen. Vascular/Lymphatic: Aortic atherosclerosis. No enlarged abdominal or pelvic lymph nodes. Reproductive: Status post hysterectomy. No adnexal masses. Other: No abdominal wall hernia or abnormality. No abdominopelvic ascites. Musculoskeletal: Mild degenerative changes of lumbar spine are noted. No acute abnormality seen. IMPRESSION: Chronic changes as described above stable from the prior exam. No acute abnormality to correspond with the patient's clinical symptomatology is noted. Electronically Signed    By: Inez Catalina M.D.   On: 02/13/2017 15:47   Ct Angio Chest Aorta W/cm &/or Wo/cm  Result Date: 02/13/2017 CLINICAL DATA:  Mid chest pressure today, complex chest pain, elevated troponins, question aortic dissection EXAM: CT ANGIOGRAPHY CHEST WITH CONTRAST TECHNIQUE: Multidetector CT imaging of the chest was performed using the standard protocol during bolus administration of intravenous contrast. Multiplanar CT image reconstructions and MIPs were obtained to evaluate the vascular anatomy. CONTRAST:  115mL ISOVUE-370 IOPAMIDOL (ISOVUE-370) INJECTION 76% IV COMPARISON:  01/06/2015 FINDINGS: Cardiovascular: Extensive atherosclerotic calcifications aorta, proximal great vessels and coronary arteries. Aorta normal caliber 3.5 cm transverse image 27. No intramural hematoma on precontrast imaging. No para-aortic hemorrhage. Following contrast, normal aortic enhancement is seen without aneurysm or dissection. Heart unremarkable. No pericardial effusion. Pulmonary arteries patent without evidence of pulmonary embolism. Mediastinum/Nodes: Esophagus unremarkable. No thoracic adenopathy. Base of cervical region normal appearance. Lungs/Pleura: Central peribronchial thickening. Lungs clear. No pulmonary infiltrate, pleural effusion or pneumothorax. Upper Abdomen: Tiny cyst upper pole LEFT kidney. Small splenules adjacent to spleen. Adrenal thickening without nodularity. Remaining visualized portions of upper abdomen unremarkable. Musculoskeletal: No acute abnormalities. Endplate spur formation lower thoracic spine. Review of the MIP images confirms the above findings. IMPRESSION: No evidence of aortic aneurysm or dissection. Central bronchitic changes. No acute intrathoracic abnormalities otherwise identified. Extensive atherosclerotic calcifications including in coronary arteries. Aortic Atherosclerosis (ICD10-I70.0). Electronically Signed   By: Lavonia Dana M.D.   On: 02/13/2017 19:28    Lab Data:  CBC: Recent  Labs  Lab 02/12/17 2010 02/13/17 1335 02/14/17 0841 02/15/17 0402 02/16/17 0156  WBC 9.1 11.2* 7.6 7.6 7.7  NEUTROABS 7.1 8.4*  --   --   --   HGB 7.2* 7.0* 8.5* 8.1* 8.0*  HCT 28.0* 27.1* 29.9* 28.4* 28.2*  MCV 68.6* 69.8* 72.4* 72.6* 74.0*  PLT 372 374 254 231 998   Basic Metabolic Panel: Recent Labs  Lab 02/12/17 2010 02/13/17 1335 02/15/17 0402 02/16/17 0156  NA 139 137 138 139  K 3.4* 3.3* 3.8 3.9  CL 102 102 106 106  CO2 24 25 27 26   GLUCOSE 107* 99 113* 90  BUN 5* 10 11 6   CREATININE 1.12* 1.02* 1.28* 1.16*  CALCIUM 10.0 9.0 8.2* 8.3*   GFR: Estimated Creatinine Clearance: 53.9 mL/min (A) (by C-G formula based on SCr of 1.16 mg/dL (H)). Liver Function Tests: Recent Labs  Lab 02/12/17 2010  02/13/17 1335  AST 48* 71*  ALT 12* 16  ALKPHOS 76 72  BILITOT 0.4 0.6  PROT 6.4* 6.5  ALBUMIN 3.8 3.8   Recent Labs  Lab 02/13/17 1335  LIPASE 21   No results for input(s): AMMONIA in the last 168 hours. Coagulation Profile: Recent Labs  Lab 02/14/17 0841  INR 1.11   Cardiac Enzymes: Recent Labs  Lab 02/13/17 1335 02/13/17 1759 02/13/17 2135 02/14/17 0841  TROPONINI 9.96* 8.93* 11.36* 10.10*   BNP (last 3 results) No results for input(s): PROBNP in the last 8760 hours. HbA1C: No results for input(s): HGBA1C in the last 72 hours. CBG: No results for input(s): GLUCAP in the last 168 hours. Lipid Profile: Recent Labs    02/14/17 0841  CHOL 129  HDL 30*  LDLCALC 67  TRIG 162*  CHOLHDL 4.3   Thyroid Function Tests: No results for input(s): TSH, T4TOTAL, FREET4, T3FREE, THYROIDAB in the last 72 hours. Anemia Panel: Recent Labs    02/14/17 1052  VITAMINB12 360  FOLATE 15.5  FERRITIN 7*  TIBC 477*  IRON 18*  RETICCTPCT 1.2   Urine analysis:    Component Value Date/Time   COLORURINE YELLOW 10/26/2016 1139   APPEARANCEUR CLEAR 10/26/2016 1139   LABSPEC 1.013 10/26/2016 1139   PHURINE 5.0 10/26/2016 1139   GLUCOSEU NEGATIVE 10/26/2016  1139   HGBUR NEGATIVE 10/26/2016 1139   BILIRUBINUR NEGATIVE 10/26/2016 1139   KETONESUR NEGATIVE 10/26/2016 1139   PROTEINUR NEGATIVE 10/26/2016 1139   UROBILINOGEN 0.2 06/24/2010 1814   NITRITE NEGATIVE 10/26/2016 1139   LEUKOCYTESUR NEGATIVE 10/26/2016 1139     Ripudeep Rai M.D. [facility] 02/16/2017, 11:34 AM  Pager: [phone] Between 7am to 7pm - call Pager - [phone]  After 7pm go to [email or web address] - password [email or web address]  Call night coverage person covering after 7pm

## 2017-02-16 NOTE — Progress Notes (Signed)
Patient back in room from EGD and Colonoscopy procedure. Patient is A&O and talking. Patient VS are stable. Will continue to monitor patient.

## 2017-02-16 NOTE — Op Note (Addendum)
[facility] Patient Name: Jennifer [name] Procedure Date : 02/16/2017 MRN: [id number] Attending MD: [name] , MD Date of Birth: 16-Apr-1950 CSN: [id number] Age: 66 Admit Type: Inpatient Procedure:                Upper GI endoscopy Indications:              Heme positive stool, anemia Providers:                [name], MD, Cleda Daub, RN, Cherylynn Ridges, Technician, Claybon Jabs CRNA, CRNA Referring MD:              Medicines:                Monitored Anesthesia Care Complications:            No immediate complications. Estimated blood loss:                            None. Estimated Blood Loss:     Estimated blood loss: none. Procedure:                Pre-Anesthesia Assessment:                           - Prior to the procedure, a History and Physical                            was performed, and patient medications and                            allergies were reviewed. The patient's tolerance of                            previous anesthesia was also reviewed. The risks                            and benefits of the procedure and the sedation                            options and risks were discussed with the patient.                            All questions were answered, and informed consent                            was obtained. Prior Anticoagulants: The patient has                            taken Xarelto (rivaroxaban), last dose was 2 days                            prior to procedure. ASA Grade Assessment: III - A  patient with severe systemic disease. After                            reviewing the risks and benefits, the patient was                            deemed in satisfactory condition to undergo the                            procedure.                           After obtaining informed consent, the endoscope was                            passed under direct vision. Throughout the                             procedure, the patient's blood pressure, pulse, and                            oxygen saturations were monitored continuously. The                            [id number] ([id number]) scope was introduced through the                            mouth, and advanced to the second part of duodenum.                            The upper GI endoscopy was accomplished without                            difficulty. The patient tolerated the procedure                            well. Scope In: Scope Out: Findings:      The esophagus was normal.      The stomach was normal.      The examined duodenum was normal. Impression:               - Normal UGI tract Moderate Sedation:      none Recommendation:           - Return patient to hospital ward for ongoing care.                           - Advance diet as tolerated.                           - Will change to once daily PPI orally.                           - OK to proceed with cardiology testing as needed,  please call if she has overt GI bleeding. The                            sigmoid polyp removed during the colonoscopy just                            prior to this exam MAY have contributed to her                            anemia. It was a bit firm and may represent                            advanced neoplasia, await path results. Procedure Code(s):        --- Professional ---                           [phone], Esophagogastroduodenoscopy, flexible,                            transoral; diagnostic, including collection of                            specimen(s) by brushing or washing, when performed                            (separate procedure) Diagnosis Code(s):        --- Professional ---                           R19.5, Other fecal abnormalities CPT copyright 2016 [employer]. All rights reserved. The codes documented in this report are preliminary and upon coder review may  be  revised to meet current compliance requirements. [name], MD 02/16/2017 8:29:25 AM This report has been signed electronically. Number of Addenda: 0

## 2017-02-16 NOTE — Anesthesia Preprocedure Evaluation (Addendum)
Anesthesia Evaluation  Patient identified by MRN, date of birth, ID band Patient awake    Reviewed: Allergy & Precautions, H&P , NPO status   Airway Mallampati: I  TM Distance: >3 FB Neck ROM: full    Dental  (+) Edentulous Upper, Edentulous Lower, Dental Advidsory Given   Pulmonary shortness of breath and with exertion, former smoker, PE   breath sounds clear to auscultation       Cardiovascular hypertension, + CAD, + Past MI and + Peripheral Vascular Disease  + Valvular Problems/Murmurs  Rhythm:regular Rate:Normal  ECG: SB, rate 59  ECHO: LV EF: 60% - 65%  Sees cardiologist   Neuro/Psych  Headaches, PSYCHIATRIC DISORDERS Anxiety Depression    GI/Hepatic GERD  Medicated and Controlled,IDA and coffee ground emesis   Endo/Other    Renal/GU Renal disease     Musculoskeletal  (+) Arthritis ,   Abdominal (+) + obese,   Peds  Hematology  (+) anemia ,   Anesthesia Other Findings Gout HLD DVT (deep venous thrombosis)  Reproductive/Obstetrics                            Anesthesia Physical  Anesthesia Plan  ASA: III  Anesthesia Plan: MAC   Post-op Pain Management:    Induction: Intravenous  PONV Risk Score and Plan: 1 and Propofol infusion and Treatment may vary due to age or medical condition  Airway Management Planned: Natural Airway  Additional Equipment:   Intra-op Plan:   Post-operative Plan:   Informed Consent: I have reviewed the patients History and Physical, chart, labs and discussed the procedure including the risks, benefits and alternatives for the proposed anesthesia with the patient or authorized representative who has indicated his/her understanding and acceptance.   Dental Advisory Given and Dental advisory given  Plan Discussed with: CRNA  Anesthesia Plan Comments:         Anesthesia Quick Evaluation

## 2017-02-16 NOTE — Transfer of Care (Signed)
Immediate Anesthesia Transfer of Care Note  Patient: Jennifer [name]  Procedure(s) Performed: ESOPHAGOGASTRODUODENOSCOPY (EGD) WITH PROPOFOL (N/A ) COLONOSCOPY WITH PROPOFOL (N/A )  Patient Location: Endoscopy Unit  Anesthesia Type:MAC  Level of Consciousness: awake, oriented and patient cooperative  Airway & Oxygen Therapy: Patient Spontanous Breathing and Patient connected to nasal cannula oxygen  Post-op Assessment: Report given to RN, Post -op Vital signs reviewed and stable and Patient moving all extremities  Post vital signs: Reviewed and stable  Last Vitals:  Vitals:   02/16/17 0715 02/16/17 0820  BP: (!) 190/75   Pulse:    Resp: 14   Temp: 36.7 C 36.5 C  SpO2:      Last Pain:  Vitals:   02/16/17 0820  TempSrc: Oral  PainSc:       Patients Stated Pain Goal: 0 (03/75/43 6067)  Complications: No apparent anesthesia complications

## 2017-02-16 NOTE — Anesthesia Postprocedure Evaluation (Signed)
Anesthesia Post Note  Patient: JAQUELYN SAKAMOTO  Procedure(s) Performed: ESOPHAGOGASTRODUODENOSCOPY (EGD) WITH PROPOFOL (N/A ) COLONOSCOPY WITH PROPOFOL (N/A )     Patient location during evaluation: PACU Anesthesia Type: MAC Level of consciousness: awake and alert Pain management: pain level controlled Vital Signs Assessment: post-procedure vital signs reviewed and stable Respiratory status: spontaneous breathing, nonlabored ventilation, respiratory function stable and patient connected to nasal cannula oxygen Cardiovascular status: stable and blood pressure returned to baseline Postop Assessment: no apparent nausea or vomiting Anesthetic complications: no    Last Vitals:  Vitals:   02/16/17 1300 02/16/17 1439  BP: (!) 107/46   Pulse:    Resp: 19 18  Temp:    SpO2:      Last Pain:  Vitals:   02/16/17 1237  TempSrc: Oral  PainSc:                  Kabao Leite P Annamarie Yamaguchi

## 2017-02-17 LAB — TYPE AND SCREEN
ABO/RH(D): A POS
Antibody Screen: NEGATIVE
Unit division: 0
Unit division: 0
Unit division: 0
Unit division: 0

## 2017-02-17 LAB — BPAM RBC
Blood Product Expiration Date: 201812252359
Blood Product Expiration Date: 201812252359
Blood Product Expiration Date: 201812272359
Blood Product Expiration Date: 201812272359
ISSUE DATE / TIME: 201812042325
ISSUE DATE / TIME: 201812050238
ISSUE DATE / TIME: 201812071423
ISSUE DATE / TIME: 201812071805
Unit Type and Rh: 6200
Unit Type and Rh: 6200
Unit Type and Rh: 6200
Unit Type and Rh: 6200

## 2017-02-17 LAB — CBC
HCT: 35.6 % — ABNORMAL LOW (ref 36.0–46.0)
Hemoglobin: 10.5 g/dL — ABNORMAL LOW (ref 12.0–15.0)
MCH: 22.2 pg — ABNORMAL LOW (ref 26.0–34.0)
MCHC: 29.5 g/dL — ABNORMAL LOW (ref 30.0–36.0)
MCV: 75.4 fL — ABNORMAL LOW (ref 78.0–100.0)
Platelets: 276 10*3/uL (ref 150–400)
RBC: 4.72 MIL/uL (ref 3.87–5.11)
RDW: 21.6 % — ABNORMAL HIGH (ref 11.5–15.5)
WBC: 8.9 10*3/uL (ref 4.0–10.5)

## 2017-02-17 LAB — BASIC METABOLIC PANEL
Anion gap: 7 (ref 5–15)
BUN: 6 mg/dL (ref 6–20)
CO2: 27 mmol/L (ref 22–32)
Calcium: 8.5 mg/dL — ABNORMAL LOW (ref 8.9–10.3)
Chloride: 106 mmol/L (ref 101–111)
Creatinine, Ser: 1.03 mg/dL — ABNORMAL HIGH (ref 0.44–1.00)
GFR calc Af Amer: >60 mL/min (ref >60)
GFR calc non Af Amer: 55 mL/min — ABNORMAL LOW (ref >60)
Glucose, Bld: 98 mg/dL (ref 65–99)
Potassium: 4.1 mmol/L (ref 3.5–5.1)
Sodium: 140 mmol/L (ref 135–145)

## 2017-02-17 MED ORDER — HYDRALAZINE HCL 20 MG/ML IJ SOLN
10.0000 mg | Freq: Four times a day (QID) | INTRAMUSCULAR | Status: DC | PRN
  Administered 2017-02-19: 10 mg via INTRAVENOUS
  Filled 2017-02-17 (×2): qty 1

## 2017-02-17 MED ORDER — ISOSORBIDE MONONITRATE ER 30 MG PO TB24
30.0000 mg | ORAL_TABLET | Freq: Every day | ORAL | Status: DC
  Administered 2017-02-17 – 2017-02-20 (×4): 30 mg via ORAL
  Filled 2017-02-17 (×4): qty 1

## 2017-02-17 NOTE — Progress Notes (Signed)
Dr Marlou Porch note reviewed, plan for heart cath on Monday. No additional cardiology recs over the weekend, call with questions over the weekend   Zandra Abts MD

## 2017-02-17 NOTE — Progress Notes (Signed)
[facility]                                                                              Patient Demographics  Jennifer [name], is a 66 y.o. female, DOB - [birth year], JJK:[id number]  Admit date - 02/13/2017   Admitting Physician Karmen Bongo, MD  Outpatient Primary MD for the patient is Redmond School, MD  Outpatient specialists:   LOS - 4  days   Medical records reviewed and are as summarized below:    Chief Complaint  Patient presents with  . Emesis       Brief summary   Per admit note by Dr. Lorin Mercy on 12/4 Patient is a 66 year old female with hypertension, hyperlipidemia, DVT, nonobstructive CAD presented with nausea, vomiting, diarrhea and chest pain.  She reported lot of weak spells, nausea vomiting diarrhea in the last 4 days.  She reported the brown looking emesis but no frank hematemesis, hematochezia or melena.  Patient also reported dyspnea on exertion.  Troponin 9.96 on admission.   Assessment & Plan    Principal Problem:   NSTEMI (non-ST elevated myocardial infarction) ([place]) -Currently no chest pain, troponin peaked at 11.3, now trending down -Cardiology was consulted, recommended to continue packed RBC transfusion, hold systemic anticoagulation -CT angiogram ruled out aortic dissection -2D echo showed EF of 60-65%, no regional wall motion abnormal dysfunction -Cleared by gastroenterology, cardiac cath on Monday  Active Problems:   Symptomatic anemia -Hemoglobin 7.0 on admission, transfused 3 units packed RBCs -FOBT negative, patient denies any hematemesis -H&H now stable, anemia panel showed iron deficiency, one episode of coffee-ground emesis.  GI was consulted.  Patient was placed on IV PPI -Patient underwent EGD, colonoscopy reassuring, no lesions -Received 2 more packed RBC transfusion, 1 unit of Feraheme on 12/7, hemoglobin 10.5 -H&H stable    Essential hypertension -BP somewhat elevated, added Imdur, hydralazine with parameters   Hyperlipidemia -Continue Lipitor 40 mg at bedtime    Tobacco use disorder -Patient counseled on tobacco cessation, continue nicotine patch   Code Status: Full CODE STATUS DVT Prophylaxis:   SCD's Family Communication: Discussed in detail with the patient, all imaging results, lab results explained to the patient   Disposition Plan:   Time Spent in minutes 25 minutes  Procedures:  EGD: Normal esophagus, stomach, duodenum Colonoscopy: 1 3 mm polyp in the ascending colon, resected, one 9 mm polyp in the sigmoid colon, diverticulosis in the left colon otherwise normal  Consultants:   Cardiology GI  Antimicrobials:      Medications  Scheduled Meds: . allopurinol  300 mg Oral Daily  . aspirin EC  81 mg Oral Daily  . atorvastatin  40 mg Oral Daily  . DULoxetine  30 mg Oral Daily  . gabapentin  300 mg Oral BID WC  . gabapentin  600 mg Oral QHS  . pantoprazole  40 mg Oral Q0600  . sodium chloride flush  3 mL Intravenous Q12H   Continuous Infusions: . sodium chloride 100 mL/hr at 02/13/17 1512  . sodium chloride    . sodium chloride 250 mL (02/16/17 1235)   PRN Meds:.sodium chloride, acetaminophen, nitroGLYCERIN, ondansetron (ZOFRAN) IV,  oxyCODONE, sodium chloride flush   Antibiotics   Anti-infectives (From admission, onward)   None        Subjective:   Jennifer [name] was seen and examined today.  Tired and exhausted after 2 procedures today otherwise no acute chest pain or shortness of breath.  No fevers.  Patient denies abdominal pain, N/V/D/C, focal weakness, numbess, tingling.  No GI bleeding overnight.  Objective:   Vitals:   02/16/17 2022 02/16/17 2100 02/17/17 0342 02/17/17 0749  BP: (!) 166/78 (!) 161/77 (!) 142/66 (!) 152/124  Pulse: 61     Resp: (!) 22 20 15 13   Temp: 98.7 F (37.1 C) 97.6 F (36.4 C) 98.1 F (36.7 C) (!) 97.5 F (36.4 C)  TempSrc: Oral Axillary Oral Oral  SpO2: 99%  98%   Weight:      Height:        Intake/Output Summary  (Last 24 hours) at 02/17/2017 1254 Last data filed at 02/16/2017 2100 Gross per 24 hour  Intake 315 ml  Output -  Net 315 ml     Wt Readings from Last 3 Encounters:  02/16/17 95.7 kg (211 lb)  02/12/17 99.8 kg (220 lb)  01/23/17 99.3 kg (219 lb)     Exam    General: Alert and oriented x 3, NAD  Eyes:   HEENT:    Cardiovascular: S1 S2 auscultated, no rubs, murmurs or gallops. Regular rate and rhythm. No pedal edema b/l  Respiratory: Clear to auscultation bilaterally, no wheezing, rales or rhonchi  Gastrointestinal: Soft, nontender, nondistended, + bowel sounds  Ext: no pedal edema bilaterally  Neuro: no new deficits  Musculoskeletal: No digital cyanosis, clubbing  Skin: No rashes  Psych: Normal affect and demeanor, alert and oriented x3    Data Reviewed:  I have personally reviewed following labs and imaging studies  Micro Results Recent Results (from the past 240 hour(s))  MRSA PCR Screening     Status: None   Collection Time: 02/13/17 10:16 PM  Result Value Ref Range Status   MRSA by PCR NEGATIVE NEGATIVE Final    Comment:        The GeneXpert MRSA Assay (FDA approved for NASAL specimens only), is one component of a comprehensive MRSA colonization surveillance program. It is not intended to diagnose MRSA infection nor to guide or monitor treatment for MRSA infections.     Radiology Reports Dg Chest 2 View  Result Date: 02/13/2017 CLINICAL DATA:  emesis x4 days with increased reflux. --- diarrhea that started x2 days ago as well. PT states generalized malaise and poor appetite with body aches for weeks. EXAM: CHEST - 2 VIEW COMPARISON:  CT 01/06/2015 FINDINGS: Lungs are clear. Heart size upper limits normal.  Atheromatous aorta. No effusion.  No pneumothorax. Visualized bones unremarkable. IMPRESSION: No acute cardiopulmonary disease. Aortic Atherosclerosis (ICD10-170.0) Electronically Signed   By: Lucrezia Europe M.D.   On: 02/13/2017 15:52   Ct Abdomen  Pelvis W Contrast  Result Date: 02/13/2017 CLINICAL DATA:  Vomiting for several days with decreased appetite EXAM: CT ABDOMEN AND PELVIS WITH CONTRAST TECHNIQUE: Multidetector CT imaging of the abdomen and pelvis was performed using the standard protocol following bolus administration of intravenous contrast. CONTRAST:  153mL ISOVUE-300 IOPAMIDOL (ISOVUE-300) INJECTION 61% COMPARISON:  04/13/2016 FINDINGS: Lower chest: No acute abnormality. Hepatobiliary: No focal liver abnormality is seen. No gallstones, gallbladder wall thickening, or biliary dilatation. Pancreas: Unremarkable. No pancreatic ductal dilatation or surrounding inflammatory changes. Spleen: Normal in size without focal abnormality. Adrenals/Urinary Tract:  The adrenal glands are within normal limits. Solitary left kidney is noted. No renal calculi or obstructive changes are noted. A simple cyst is noted in the upper pole stable from the prior exam. No obstructive changes are noted. The right kidney is severely shrunken and calcified stable from the prior exam. The bladder is well distended. Stomach/Bowel: Scattered diverticular change of the colon is noted. The colon is predominately decompressed. No inflammatory or obstructive changes are seen. The appendix is within normal limits. No obstructive or inflammatory changes in small bowel are seen. Vascular/Lymphatic: Aortic atherosclerosis. No enlarged abdominal or pelvic lymph nodes. Reproductive: Status post hysterectomy. No adnexal masses. Other: No abdominal wall hernia or abnormality. No abdominopelvic ascites. Musculoskeletal: Mild degenerative changes of lumbar spine are noted. No acute abnormality seen. IMPRESSION: Chronic changes as described above stable from the prior exam. No acute abnormality to correspond with the patient's clinical symptomatology is noted. Electronically Signed   By: Inez Catalina M.D.   On: 02/13/2017 15:47   Ct Angio Chest Aorta W/cm &/or Wo/cm  Result Date:  02/13/2017 CLINICAL DATA:  Mid chest pressure today, complex chest pain, elevated troponins, question aortic dissection EXAM: CT ANGIOGRAPHY CHEST WITH CONTRAST TECHNIQUE: Multidetector CT imaging of the chest was performed using the standard protocol during bolus administration of intravenous contrast. Multiplanar CT image reconstructions and MIPs were obtained to evaluate the vascular anatomy. CONTRAST:  160mL ISOVUE-370 IOPAMIDOL (ISOVUE-370) INJECTION 76% IV COMPARISON:  01/06/2015 FINDINGS: Cardiovascular: Extensive atherosclerotic calcifications aorta, proximal great vessels and coronary arteries. Aorta normal caliber 3.5 cm transverse image 27. No intramural hematoma on precontrast imaging. No para-aortic hemorrhage. Following contrast, normal aortic enhancement is seen without aneurysm or dissection. Heart unremarkable. No pericardial effusion. Pulmonary arteries patent without evidence of pulmonary embolism. Mediastinum/Nodes: Esophagus unremarkable. No thoracic adenopathy. Base of cervical region normal appearance. Lungs/Pleura: Central peribronchial thickening. Lungs clear. No pulmonary infiltrate, pleural effusion or pneumothorax. Upper Abdomen: Tiny cyst upper pole LEFT kidney. Small splenules adjacent to spleen. Adrenal thickening without nodularity. Remaining visualized portions of upper abdomen unremarkable. Musculoskeletal: No acute abnormalities. Endplate spur formation lower thoracic spine. Review of the MIP images confirms the above findings. IMPRESSION: No evidence of aortic aneurysm or dissection. Central bronchitic changes. No acute intrathoracic abnormalities otherwise identified. Extensive atherosclerotic calcifications including in coronary arteries. Aortic Atherosclerosis (ICD10-I70.0). Electronically Signed   By: Lavonia Dana M.D.   On: 02/13/2017 19:28    Lab Data:  CBC: Recent Labs  Lab 02/12/17 2010 02/13/17 1335 02/14/17 0841 02/15/17 0402 02/16/17 0156 02/17/17 0208  WBC  9.1 11.2* 7.6 7.6 7.7 8.9  NEUTROABS 7.1 8.4*  --   --   --   --   HGB 7.2* 7.0* 8.5* 8.1* 8.0* 10.5*  HCT 28.0* 27.1* 29.9* 28.4* 28.2* 35.6*  MCV 68.6* 69.8* 72.4* 72.6* 74.0* 75.4*  PLT 372 374 254 231 242 536   Basic Metabolic Panel: Recent Labs  Lab 02/12/17 2010 02/13/17 1335 02/15/17 0402 02/16/17 0156 02/17/17 0208  NA 139 137 138 139 140  K 3.4* 3.3* 3.8 3.9 4.1  CL 102 102 106 106 106  CO2 24 25 27 26 27   GLUCOSE 107* 99 113* 90 98  BUN 5* 10 11 6 6   CREATININE 1.12* 1.02* 1.28* 1.16* 1.03*  CALCIUM 10.0 9.0 8.2* 8.3* 8.5*   GFR: Estimated Creatinine Clearance: 60.7 mL/min (A) (by C-G formula based on SCr of 1.03 mg/dL (H)). Liver Function Tests: Recent Labs  Lab 02/12/17 2010 02/13/17 1335  AST 48*  71*  ALT 12* 16  ALKPHOS 76 72  BILITOT 0.4 0.6  PROT 6.4* 6.5  ALBUMIN 3.8 3.8   Recent Labs  Lab 02/13/17 1335  LIPASE 21   No results for input(s): AMMONIA in the last 168 hours. Coagulation Profile: Recent Labs  Lab 02/14/17 0841  INR 1.11   Cardiac Enzymes: Recent Labs  Lab 02/13/17 1335 02/13/17 1759 02/13/17 2135 02/14/17 0841  TROPONINI 9.96* 8.93* 11.36* 10.10*   BNP (last 3 results) No results for input(s): PROBNP in the last 8760 hours. HbA1C: No results for input(s): HGBA1C in the last 72 hours. CBG: No results for input(s): GLUCAP in the last 168 hours. Lipid Profile: No results for input(s): CHOL, HDL, LDLCALC, TRIG, CHOLHDL, LDLDIRECT in the last 72 hours. Thyroid Function Tests: No results for input(s): TSH, T4TOTAL, FREET4, T3FREE, THYROIDAB in the last 72 hours. Anemia Panel: No results for input(s): VITAMINB12, FOLATE, FERRITIN, TIBC, IRON, RETICCTPCT in the last 72 hours. Urine analysis:    Component Value Date/Time   COLORURINE YELLOW 10/26/2016 1139   APPEARANCEUR CLEAR 10/26/2016 1139   LABSPEC 1.013 10/26/2016 1139   PHURINE 5.0 10/26/2016 1139   GLUCOSEU NEGATIVE 10/26/2016 1139   HGBUR NEGATIVE 10/26/2016  [place] 10/26/2016 1139   KETONESUR NEGATIVE 10/26/2016 1139   PROTEINUR NEGATIVE 10/26/2016 1139   UROBILINOGEN 0.2 06/24/2010 1814   NITRITE NEGATIVE 10/26/2016 1139   LEUKOCYTESUR NEGATIVE 10/26/2016 1139     Costas Sena M.D. [facility] 02/17/2017, 12:54 PM  Pager: [phone] Between 7am to 7pm - call Pager - [phone]  After 7pm go to [email or web address] - password [email or web address]  Call night coverage person covering after 7pm

## 2017-02-18 LAB — CBC
HCT: 35.3 % — ABNORMAL LOW (ref 36.0–46.0)
Hemoglobin: 10.2 g/dL — ABNORMAL LOW (ref 12.0–15.0)
MCH: 22.2 pg — ABNORMAL LOW (ref 26.0–34.0)
MCHC: 28.9 g/dL — ABNORMAL LOW (ref 30.0–36.0)
MCV: 76.7 fL — ABNORMAL LOW (ref 78.0–100.0)
Platelets: 301 10*3/uL (ref 150–400)
RBC: 4.6 MIL/uL (ref 3.87–5.11)
RDW: 22.2 % — ABNORMAL HIGH (ref 11.5–15.5)
WBC: 9.3 10*3/uL (ref 4.0–10.5)

## 2017-02-18 LAB — BASIC METABOLIC PANEL
Anion gap: 7 (ref 5–15)
BUN: 5 mg/dL — ABNORMAL LOW (ref 6–20)
CO2: 28 mmol/L (ref 22–32)
Calcium: 8.6 mg/dL — ABNORMAL LOW (ref 8.9–10.3)
Chloride: 107 mmol/L (ref 101–111)
Creatinine, Ser: 1.11 mg/dL — ABNORMAL HIGH (ref 0.44–1.00)
GFR calc Af Amer: 58 mL/min — ABNORMAL LOW (ref >60)
GFR calc non Af Amer: 50 mL/min — ABNORMAL LOW (ref >60)
Glucose, Bld: 127 mg/dL — ABNORMAL HIGH (ref 65–99)
Potassium: 3.8 mmol/L (ref 3.5–5.1)
Sodium: 142 mmol/L (ref 135–145)

## 2017-02-18 MED ORDER — SODIUM CHLORIDE 0.9% FLUSH: 3.0000 mL | INTRAVENOUS | Status: DC | PRN

## 2017-02-18 MED ORDER — SODIUM CHLORIDE 0.9 % IV SOLN: INTRAVENOUS | Status: DC

## 2017-02-18 MED ORDER — SODIUM CHLORIDE 0.9% FLUSH: 3.0000 mL | Freq: Two times a day (BID) | INTRAVENOUS | Status: DC

## 2017-02-18 MED ORDER — SODIUM CHLORIDE 0.9 % IV SOLN: 250.0000 mL | INTRAVENOUS | Status: DC | PRN

## 2017-02-18 MED ORDER — ASPIRIN 81 MG PO CHEW
81.0000 mg | CHEWABLE_TABLET | ORAL | Status: AC
  Administered 2017-02-19: 81 mg via ORAL
  Filled 2017-02-18: qty 1

## 2017-02-18 NOTE — Progress Notes (Signed)
Progress Note  Patient Name: LOURA PITT Date of Encounter: 02/18/2017  Primary Cardiologist: No primary care provider on file.   Subjective   No complaints  Inpatient Medications    Scheduled Meds: . allopurinol  300 mg Oral Daily  . aspirin EC  81 mg Oral Daily  . atorvastatin  40 mg Oral Daily  . DULoxetine  30 mg Oral Daily  . gabapentin  300 mg Oral BID WC  . gabapentin  600 mg Oral QHS  . isosorbide mononitrate  30 mg Oral Daily  . pantoprazole  40 mg Oral Q0600  . sodium chloride flush  3 mL Intravenous Q12H   Continuous Infusions: . sodium chloride 100 mL/hr at 02/13/17 1512  . sodium chloride    . sodium chloride 250 mL (02/16/17 1235)   PRN Meds: sodium chloride, acetaminophen, hydrALAZINE, nitroGLYCERIN, ondansetron (ZOFRAN) IV, oxyCODONE, sodium chloride flush   Vital Signs    Vitals:   02/17/17 0749 02/17/17 1954 02/18/17 0000 02/18/17 0415  BP: (!) 152/124 130/70 125/85 (!) 155/66  Pulse:  (!) 55    Resp: 13 16  20   Temp: (!) 97.5 F (36.4 C) 97.9 F (36.6 C) 98.4 F (36.9 C) 98.2 F (36.8 C)  TempSrc: Oral Oral Oral Oral  SpO2:  98% 94% 96%  Weight:      Height:        Intake/Output Summary (Last 24 hours) at 02/18/2017 0959 Last data filed at 02/18/2017 0400 Gross per 24 hour  Intake 720 ml  Output -  Net 720 ml   Filed Weights   02/13/17 1334 02/13/17 2100 02/16/17 0715  Weight: 219 lb (99.3 kg) 211 lb 3.2 oz (95.8 kg) 211 lb (95.7 kg)    Telemetry    SR and sinus brady, PACs with pauses - Personally Reviewed  ECG  n/a  Physical Exam   GEN: No acute distress.   Neck: No JVD Cardiac: RRR, 2/6 systolic murmur rusb, rubs, or gallops.  Respiratory: Clear to auscultation bilaterally. GI: Soft, nontender, non-distended  MS: No edema; No deformity. Neuro:  Nonfocal  Psych: Normal affect   Labs    Chemistry Recent Labs  Lab 02/12/17 2010 02/13/17 1335  02/16/17 0156 02/17/17 0208 02/18/17 0314  NA 139 137   < > 139  140 142  K 3.4* 3.3*   < > 3.9 4.1 3.8  CL 102 102   < > 106 106 107  CO2 24 25   < > 26 27 28   GLUCOSE 107* 99   < > 90 98 127*  BUN 5* 10   < > 6 6 5*  CREATININE 1.12* 1.02*   < > 1.16* 1.03* 1.11*  CALCIUM 10.0 9.0   < > 8.3* 8.5* 8.6*  PROT 6.4* 6.5  --   --   --   --   ALBUMIN 3.8 3.8  --   --   --   --   AST 48* 71*  --   --   --   --   ALT 12* 16  --   --   --   --   ALKPHOS 76 72  --   --   --   --   BILITOT 0.4 0.6  --   --   --   --   GFRNONAA 50* 56*   < > 48* 55* 50*  GFRAA 58* >60   < > 55* >60 58*  ANIONGAP 13 10   < >  7 7 7    < > = values in this interval not displayed.     Hematology Recent Labs  Lab 02/16/17 0156 02/17/17 0208 02/18/17 0314  WBC 7.7 8.9 9.3  RBC 3.81* 4.72 4.60  HGB 8.0* 10.5* 10.2*  HCT 28.2* 35.6* 35.3*  MCV 74.0* 75.4* 76.7*  MCH 21.0* 22.2* 22.2*  MCHC 28.4* 29.5* 28.9*  RDW 22.0* 21.6* 22.2*  PLT 242 276 301    Cardiac Enzymes Recent Labs  Lab 02/13/17 1335 02/13/17 1759 02/13/17 2135 02/14/17 0841  TROPONINI 9.96* 8.93* 11.36* 10.10*   No results for input(s): TROPIPOC in the last 168 hours.   BNPNo results for input(s): BNP, PROBNP in the last 168 hours.   DDimer No results for input(s): DDIMER in the last 168 hours.   Radiology    No results found.  Cardiac Studies     Patient Profile     66 y.o. female with elevated troponin, severe iron def anemia with history of moderate CAD on Xarelto for prior history of DVT/PE with recent vomiting, diarrhea. EGD and colon negative    Assessment & Plan    1. CAD/Elevated troponin - peak troponin of 11.3. Echo LVEF 60-65%, no WMAs, grade II diastolic dysfunction - management complicated by significant anemia on admission. Hgb has been stable since transfusion, negative GI workup - plan for cath tomorrow.  - medical therapy with ASA, atorva 50, imdur 30. Low heart rates at times, no beta blocker. After cath consider ACE-I for CV benefits.   2. Anemia - Hgb 7 on  admit, has received 3 units pRBCs - FOBT negative, H&H has since been stable. Unremarkable EGD and colonscopy   3. History of PE/DVT - on home xarelto, held on admission with anemia and now held for cath  Plan for cath tomorrow  I have reviewed the risks, indications, and alternatives to cardiac catheterization, possible angioplasty, and stenting with the patient. Risks include but are not limited to bleeding, infection, vascular injury, stroke, myocardial infection, arrhythmia, kidney injury, radiation-related injury in the case of prolonged fluoroscopy use, emergency cardiac surgery, and death. The patient understands the risks of serious complication is 1-2 in 5621 with diagnostic cardiac cath and 1-2% or less with angioplasty/stenting.    For questions or updates, please contact [place] Please consult [email or web address] for contact info under Cardiology/STEMI.      Merrily Pew, MD  02/18/2017, 9:59 AM

## 2017-02-18 NOTE — H&P (View-Only) (Signed)
Progress Note  Patient Name: Jennifer [name] Date of Encounter: 02/18/2017  Primary Cardiologist: No primary care provider on file.   Subjective   No complaints  Inpatient Medications    Scheduled Meds: . allopurinol  300 mg Oral Daily  . aspirin EC  81 mg Oral Daily  . atorvastatin  40 mg Oral Daily  . DULoxetine  30 mg Oral Daily  . gabapentin  300 mg Oral BID WC  . gabapentin  600 mg Oral QHS  . isosorbide mononitrate  30 mg Oral Daily  . pantoprazole  40 mg Oral Q0600  . sodium chloride flush  3 mL Intravenous Q12H   Continuous Infusions: . sodium chloride 100 mL/hr at 02/13/17 1512  . sodium chloride    . sodium chloride 250 mL (02/16/17 1235)   PRN Meds: sodium chloride, acetaminophen, hydrALAZINE, nitroGLYCERIN, ondansetron (ZOFRAN) IV, oxyCODONE, sodium chloride flush   Vital Signs    Vitals:   02/17/17 0749 02/17/17 1954 02/18/17 0000 02/18/17 0415  BP: (!) 152/124 130/70 125/85 (!) 155/66  Pulse:  (!) 55    Resp: 13 16  20   Temp: (!) 97.5 F (36.4 C) 97.9 F (36.6 C) 98.4 F (36.9 C) 98.2 F (36.8 C)  TempSrc: Oral Oral Oral Oral  SpO2:  98% 94% 96%  Weight:      Height:        Intake/Output Summary (Last 24 hours) at 02/18/2017 0959 Last data filed at 02/18/2017 0400 Gross per 24 hour  Intake 720 ml  Output -  Net 720 ml   Filed Weights   02/13/17 1334 02/13/17 2100 02/16/17 0715  Weight: 219 lb (99.3 kg) 211 lb 3.2 oz (95.8 kg) 211 lb (95.7 kg)    Telemetry    SR and sinus brady, PACs with pauses - Personally Reviewed  ECG  n/a  Physical Exam   GEN: No acute distress.   Neck: No JVD Cardiac: RRR, 2/6 systolic murmur rusb, rubs, or gallops.  Respiratory: Clear to auscultation bilaterally. GI: Soft, nontender, non-distended  MS: No edema; No deformity. Neuro:  Nonfocal  Psych: Normal affect   Labs    Chemistry Recent Labs  Lab 02/12/17 2010 02/13/17 1335  02/16/17 0156 02/17/17 0208 02/18/17 0314  NA 139 137   < > 139  140 142  K 3.4* 3.3*   < > 3.9 4.1 3.8  CL 102 102   < > 106 106 107  CO2 24 25   < > 26 27 28   GLUCOSE 107* 99   < > 90 98 127*  BUN 5* 10   < > 6 6 5*  CREATININE 1.12* 1.02*   < > 1.16* 1.03* 1.11*  CALCIUM 10.0 9.0   < > 8.3* 8.5* 8.6*  PROT 6.4* 6.5  --   --   --   --   ALBUMIN 3.8 3.8  --   --   --   --   AST 48* 71*  --   --   --   --   ALT 12* 16  --   --   --   --   ALKPHOS 76 72  --   --   --   --   BILITOT 0.4 0.6  --   --   --   --   GFRNONAA 50* 56*   < > 48* 55* 50*  GFRAA 58* >60   < > 55* >60 58*  ANIONGAP 13 10   < >  7 7 7    < > = values in this interval not displayed.     Hematology Recent Labs  Lab 02/16/17 0156 02/17/17 0208 02/18/17 0314  WBC 7.7 8.9 9.3  RBC 3.81* 4.72 4.60  HGB 8.0* 10.5* 10.2*  HCT 28.2* 35.6* 35.3*  MCV 74.0* 75.4* 76.7*  MCH 21.0* 22.2* 22.2*  MCHC 28.4* 29.5* 28.9*  RDW 22.0* 21.6* 22.2*  PLT 242 276 301    Cardiac Enzymes Recent Labs  Lab 02/13/17 1335 02/13/17 1759 02/13/17 2135 02/14/17 0841  TROPONINI 9.96* 8.93* 11.36* 10.10*   No results for input(s): TROPIPOC in the last 168 hours.   BNPNo results for input(s): BNP, PROBNP in the last 168 hours.   DDimer No results for input(s): DDIMER in the last 168 hours.   Radiology    No results found.  Cardiac Studies     Patient Profile     66 y.o. female with elevated troponin, severe iron def anemia with history of moderate CAD on Xarelto for prior history of DVT/PE with recent vomiting, diarrhea. EGD and colon negative    Assessment & Plan    1. CAD/Elevated troponin - peak troponin of 11.3. Echo LVEF 60-65%, no WMAs, grade II diastolic dysfunction - management complicated by significant anemia on admission. Hgb has been stable since transfusion, negative GI workup - plan for cath tomorrow.  - medical therapy with ASA, atorva 50, imdur 30. Low heart rates at times, no beta blocker. After cath consider ACE-I for CV benefits.   2. Anemia - Hgb 7 on  admit, has received 3 units pRBCs - FOBT negative, H&H has since been stable. Unremarkable EGD and colonscopy   3. History of PE/DVT - on home xarelto, held on admission with anemia and now held for cath  Plan for cath tomorrow  I have reviewed the risks, indications, and alternatives to cardiac catheterization, possible angioplasty, and stenting with the patient. Risks include but are not limited to bleeding, infection, vascular injury, stroke, myocardial infection, arrhythmia, kidney injury, radiation-related injury in the case of prolonged fluoroscopy use, emergency cardiac surgery, and death. The patient understands the risks of serious complication is 1-2 in 4098 with diagnostic cardiac cath and 1-2% or less with angioplasty/stenting.    For questions or updates, please contact [place] Please consult [email or web address] for contact info under Cardiology/STEMI.      Merrily Pew, MD  02/18/2017, 9:59 AM

## 2017-02-18 NOTE — Progress Notes (Signed)
[facility]                                                                              Patient Demographics  Jennifer [name], is a 66 y.o. female, DOB - 1950/11/12, PRF:[id number]  Admit date - 02/13/2017   Admitting Physician Karmen Bongo, MD  Outpatient Primary MD for the patient is Redmond School, MD  Outpatient specialists:   LOS - 5  days   Medical records reviewed and are as summarized below:    Chief Complaint  Patient presents with  . Emesis       Brief summary   Per admit note by Dr. Lorin Mercy on 12/4 Patient is a 66 year old female with hypertension, hyperlipidemia, DVT, nonobstructive CAD presented with nausea, vomiting, diarrhea and chest pain.  She reported lot of weak spells, nausea vomiting diarrhea in the last 4 days.  She reported the brown looking emesis but no frank hematemesis, hematochezia or melena.  Patient also reported dyspnea on exertion.  Troponin 9.96 on admission.   Assessment & Plan    Principal Problem:   NSTEMI (non-ST elevated myocardial infarction) ([place]) -Currently no chest pain, troponin peaked at 11.3, now trending down -Cardiology was consulted, recommended to continue packed RBC transfusion, hold systemic anticoagulation -CT angiogram ruled out aortic dissection -2D echo showed EF of 60-65%, no regional wall motion abnormal dysfunction -Cleared by GI, cath on 12/10, NPO after MN  Active Problems:   Symptomatic anemia -Hemoglobin 7.0 on admission, transfused 3 units packed RBCs -FOBT negative, patient denies any hematemesis -H&H now stable, anemia panel showed iron deficiency, one episode of coffee-ground emesis.  GI was consulted.  Patient was placed on IV PPI -Patient underwent EGD, colonoscopy reassuring, no lesions -Received 2 more packed RBC transfusion, 1 unit of Feraheme on 12/7, hemoglobin 10.5 -H&H stable     Essential hypertension -BP somewhat elevated, added Imdur, hydralazine with parameters   Hyperlipidemia -Continue Lipitor 40 mg at bedtime    Tobacco use disorder -Patient counseled on tobacco cessation, continue nicotine patch  History of PE/DVT -Xarelto on hold for cath   Code Status: Full CODE STATUS DVT Prophylaxis:   SCD's Family Communication: Discussed in detail with the patient, all imaging results, lab results explained to the patient   Disposition Plan: Possible DC home tomorrow if no intervention  Time Spent in minutes 25 minutes  Procedures:  EGD: Normal esophagus, stomach, duodenum Colonoscopy: 1 3 mm polyp in the ascending colon, resected, one 9 mm polyp in the sigmoid colon, diverticulosis in the left colon otherwise normal  Consultants:   Cardiology GI  Antimicrobials:      Medications  Scheduled Meds: . allopurinol  300 mg Oral Daily  . aspirin EC  81 mg Oral Daily  . atorvastatin  40 mg Oral Daily  . DULoxetine  30 mg Oral Daily  . gabapentin  300 mg Oral BID WC  . gabapentin  600 mg Oral QHS  . isosorbide mononitrate  30 mg Oral Daily  . pantoprazole  40 mg Oral Q0600  . sodium chloride flush  3 mL Intravenous Q12H   Continuous Infusions: . sodium chloride 100 mL/hr  at 02/13/17 1512  . sodium chloride    . sodium chloride 250 mL (02/16/17 1235)   PRN Meds:.sodium chloride, acetaminophen, hydrALAZINE, nitroGLYCERIN, ondansetron (ZOFRAN) IV, oxyCODONE, sodium chloride flush   Antibiotics   Anti-infectives (From admission, onward)   None        Subjective:   Jennifer [name] was seen and examined today.  Denies any specific complaints today.  No chest pain or shortness of breath, no GI bleeding.  Patient denies abdominal pain, N/V/D/C, focal weakness, numbess, tingling.      Objective:   Vitals:   02/17/17 0749 02/17/17 1954 02/18/17 0000 02/18/17 0415  BP: (!) 152/124 130/70 125/85 (!) 155/66  Pulse:  (!) 55    Resp: 13 16  20   Temp: (!) 97.5 F (36.4 C) 97.9 F (36.6 C) 98.4 F (36.9 C) 98.2 F (36.8 C)    TempSrc: Oral Oral Oral Oral  SpO2:  98% 94% 96%  Weight:      Height:        Intake/Output Summary (Last 24 hours) at 02/18/2017 1053 Last data filed at 02/18/2017 0400 Gross per 24 hour  Intake 720 ml  Output -  Net 720 ml     Wt Readings from Last 3 Encounters:  02/16/17 95.7 kg (211 lb)  02/12/17 99.8 kg (220 lb)  01/23/17 99.3 kg (219 lb)     Exam   General: Alert and oriented x 3, NAD  Eyes:   HEENT:    Cardiovascular: S1 S2 clear, RRR, 2/6 SEM No pedal edema b/l  Respiratory: Clear to auscultation bilaterally, no wheezing, rales or rhonchi  Gastrointestinal: Soft, nontender, nondistended, + bowel sounds  Ext: no pedal edema bilaterally  Neuro: no new deficits  Musculoskeletal: No digital cyanosis, clubbing  Skin: No rashes  Psych: Normal affect and demeanor, alert and oriented x3    Data Reviewed:  I have personally reviewed following labs and imaging studies  Micro Results Recent Results (from the past 240 hour(s))  MRSA PCR Screening     Status: None   Collection Time: 02/13/17 10:16 PM  Result Value Ref Range Status   MRSA by PCR NEGATIVE NEGATIVE Final    Comment:        The GeneXpert MRSA Assay (FDA approved for NASAL specimens only), is one component of a comprehensive MRSA colonization surveillance program. It is not intended to diagnose MRSA infection nor to guide or monitor treatment for MRSA infections.     Radiology Reports Dg Chest 2 View  Result Date: 02/13/2017 CLINICAL DATA:  emesis x4 days with increased reflux. --- diarrhea that started x2 days ago as well. PT states generalized malaise and poor appetite with body aches for weeks. EXAM: CHEST - 2 VIEW COMPARISON:  CT 01/06/2015 FINDINGS: Lungs are clear. Heart size upper limits normal.  Atheromatous aorta. No effusion.  No pneumothorax. Visualized bones unremarkable. IMPRESSION: No acute cardiopulmonary disease. Aortic Atherosclerosis (ICD10-170.0) Electronically Signed    By: Lucrezia Europe M.D.   On: 02/13/2017 15:52   Ct Abdomen Pelvis W Contrast  Result Date: 02/13/2017 CLINICAL DATA:  Vomiting for several days with decreased appetite EXAM: CT ABDOMEN AND PELVIS WITH CONTRAST TECHNIQUE: Multidetector CT imaging of the abdomen and pelvis was performed using the standard protocol following bolus administration of intravenous contrast. CONTRAST:  195mL ISOVUE-300 IOPAMIDOL (ISOVUE-300) INJECTION 61% COMPARISON:  04/13/2016 FINDINGS: Lower chest: No acute abnormality. Hepatobiliary: No focal liver abnormality is seen. No gallstones, gallbladder wall thickening, or biliary dilatation. Pancreas: Unremarkable. No  pancreatic ductal dilatation or surrounding inflammatory changes. Spleen: Normal in size without focal abnormality. Adrenals/Urinary Tract: The adrenal glands are within normal limits. Solitary left kidney is noted. No renal calculi or obstructive changes are noted. A simple cyst is noted in the upper pole stable from the prior exam. No obstructive changes are noted. The right kidney is severely shrunken and calcified stable from the prior exam. The bladder is well distended. Stomach/Bowel: Scattered diverticular change of the colon is noted. The colon is predominately decompressed. No inflammatory or obstructive changes are seen. The appendix is within normal limits. No obstructive or inflammatory changes in small bowel are seen. Vascular/Lymphatic: Aortic atherosclerosis. No enlarged abdominal or pelvic lymph nodes. Reproductive: Status post hysterectomy. No adnexal masses. Other: No abdominal wall hernia or abnormality. No abdominopelvic ascites. Musculoskeletal: Mild degenerative changes of lumbar spine are noted. No acute abnormality seen. IMPRESSION: Chronic changes as described above stable from the prior exam. No acute abnormality to correspond with the patient's clinical symptomatology is noted. Electronically Signed   By: Inez Catalina M.D.   On: 02/13/2017 15:47   Ct  Angio Chest Aorta W/cm &/or Wo/cm  Result Date: 02/13/2017 CLINICAL DATA:  Mid chest pressure today, complex chest pain, elevated troponins, question aortic dissection EXAM: CT ANGIOGRAPHY CHEST WITH CONTRAST TECHNIQUE: Multidetector CT imaging of the chest was performed using the standard protocol during bolus administration of intravenous contrast. Multiplanar CT image reconstructions and MIPs were obtained to evaluate the vascular anatomy. CONTRAST:  182mL ISOVUE-370 IOPAMIDOL (ISOVUE-370) INJECTION 76% IV COMPARISON:  01/06/2015 FINDINGS: Cardiovascular: Extensive atherosclerotic calcifications aorta, proximal great vessels and coronary arteries. Aorta normal caliber 3.5 cm transverse image 27. No intramural hematoma on precontrast imaging. No para-aortic hemorrhage. Following contrast, normal aortic enhancement is seen without aneurysm or dissection. Heart unremarkable. No pericardial effusion. Pulmonary arteries patent without evidence of pulmonary embolism. Mediastinum/Nodes: Esophagus unremarkable. No thoracic adenopathy. Base of cervical region normal appearance. Lungs/Pleura: Central peribronchial thickening. Lungs clear. No pulmonary infiltrate, pleural effusion or pneumothorax. Upper Abdomen: Tiny cyst upper pole LEFT kidney. Small splenules adjacent to spleen. Adrenal thickening without nodularity. Remaining visualized portions of upper abdomen unremarkable. Musculoskeletal: No acute abnormalities. Endplate spur formation lower thoracic spine. Review of the MIP images confirms the above findings. IMPRESSION: No evidence of aortic aneurysm or dissection. Central bronchitic changes. No acute intrathoracic abnormalities otherwise identified. Extensive atherosclerotic calcifications including in coronary arteries. Aortic Atherosclerosis (ICD10-I70.0). Electronically Signed   By: Lavonia Dana M.D.   On: 02/13/2017 19:28    Lab Data:  CBC: Recent Labs  Lab 02/12/17 2010 02/13/17 1335 02/14/17 0841  02/15/17 0402 02/16/17 0156 02/17/17 0208 02/18/17 0314  WBC 9.1 11.2* 7.6 7.6 7.7 8.9 9.3  NEUTROABS 7.1 8.4*  --   --   --   --   --   HGB 7.2* 7.0* 8.5* 8.1* 8.0* 10.5* 10.2*  HCT 28.0* 27.1* 29.9* 28.4* 28.2* 35.6* 35.3*  MCV 68.6* 69.8* 72.4* 72.6* 74.0* 75.4* 76.7*  PLT 372 374 254 231 242 276 161   Basic Metabolic Panel: Recent Labs  Lab 02/13/17 1335 02/15/17 0402 02/16/17 0156 02/17/17 0208 02/18/17 0314  NA 137 138 139 140 142  K 3.3* 3.8 3.9 4.1 3.8  CL 102 106 106 106 107  CO2 25 27 26 27 28   GLUCOSE 99 113* 90 98 127*  BUN 10 11 6 6  5*  CREATININE 1.02* 1.28* 1.16* 1.03* 1.11*  CALCIUM 9.0 8.2* 8.3* 8.5* 8.6*   GFR: Estimated Creatinine Clearance: 56.3  mL/min (A) (by C-G formula based on SCr of 1.11 mg/dL (H)). Liver Function Tests: Recent Labs  Lab 02/12/17 2010 02/13/17 1335  AST 48* 71*  ALT 12* 16  ALKPHOS 76 72  BILITOT 0.4 0.6  PROT 6.4* 6.5  ALBUMIN 3.8 3.8   Recent Labs  Lab 02/13/17 1335  LIPASE 21   No results for input(s): AMMONIA in the last 168 hours. Coagulation Profile: Recent Labs  Lab 02/14/17 0841  INR 1.11   Cardiac Enzymes: Recent Labs  Lab 02/13/17 1335 02/13/17 1759 02/13/17 2135 02/14/17 0841  TROPONINI 9.96* 8.93* 11.36* 10.10*   BNP (last 3 results) No results for input(s): PROBNP in the last 8760 hours. HbA1C: No results for input(s): HGBA1C in the last 72 hours. CBG: No results for input(s): GLUCAP in the last 168 hours. Lipid Profile: No results for input(s): CHOL, HDL, LDLCALC, TRIG, CHOLHDL, LDLDIRECT in the last 72 hours. Thyroid Function Tests: No results for input(s): TSH, T4TOTAL, FREET4, T3FREE, THYROIDAB in the last 72 hours. Anemia Panel: No results for input(s): VITAMINB12, FOLATE, FERRITIN, TIBC, IRON, RETICCTPCT in the last 72 hours. Urine analysis:    Component Value Date/Time   COLORURINE YELLOW 10/26/2016 1139   APPEARANCEUR CLEAR 10/26/2016 1139   LABSPEC 1.013 10/26/2016 1139    PHURINE 5.0 10/26/2016 1139   GLUCOSEU NEGATIVE 10/26/2016 1139   HGBUR NEGATIVE 10/26/2016 Ramirez-Perez 10/26/2016 1139   KETONESUR NEGATIVE 10/26/2016 1139   PROTEINUR NEGATIVE 10/26/2016 1139   UROBILINOGEN 0.2 06/24/2010 1814   NITRITE NEGATIVE 10/26/2016 1139   LEUKOCYTESUR NEGATIVE 10/26/2016 1139     Ripudeep Rai M.D. [facility] 02/18/2017, 10:53 AM  Pager: [phone] Between 7am to 7pm - call Pager - [phone]  After 7pm go to [email or web address] - password [email or web address]  Call night coverage person covering after 7pm

## 2017-02-19 ENCOUNTER — Inpatient Hospital Stay (HOSPITAL_COMMUNITY): Payer: Medicare Other

## 2017-02-19 ENCOUNTER — Inpatient Hospital Stay (HOSPITAL_COMMUNITY)
Admission: EM | Disposition: A | Discharge status: Home / Self Care | Payer: Self-pay | Source: Home / Self Care | Attending: Internal Medicine

## 2017-02-19 ENCOUNTER — Encounter (HOSPITAL_COMMUNITY): Payer: Self-pay | Admitting: Internal Medicine

## 2017-02-19 DIAGNOSIS — I214 Non-ST elevation (NSTEMI) myocardial infarction: Secondary | ICD-10-CM

## 2017-02-19 DIAGNOSIS — I25110 Atherosclerotic heart disease of native coronary artery with unstable angina pectoris: Secondary | ICD-10-CM

## 2017-02-19 HISTORY — PX: LEFT HEART CATH AND CORONARY ANGIOGRAPHY: CATH118249

## 2017-02-19 LAB — BASIC METABOLIC PANEL
Anion gap: 8 (ref 5–15)
BUN: 7 mg/dL (ref 6–20)
CO2: 27 mmol/L (ref 22–32)
Calcium: 8.4 mg/dL — ABNORMAL LOW (ref 8.9–10.3)
Chloride: 106 mmol/L (ref 101–111)
Creatinine, Ser: 1.04 mg/dL — ABNORMAL HIGH (ref 0.44–1.00)
GFR calc Af Amer: >60 mL/min (ref >60)
GFR calc non Af Amer: 54 mL/min — ABNORMAL LOW (ref >60)
Glucose, Bld: 112 mg/dL — ABNORMAL HIGH (ref 65–99)
Potassium: 3.6 mmol/L (ref 3.5–5.1)
Sodium: 141 mmol/L (ref 135–145)

## 2017-02-19 LAB — PULMONARY FUNCTION TEST
FEF 25-75 Post: 3.02 L/sec
FEF 25-75 Pre: 1.51 L/sec
FEF2575-%Change-Post: 99 %
FEF2575-%Pred-Post: 146 %
FEF2575-%Pred-Pre: 73 %
FEV1-%Change-Post: 21 %
FEV1-%Pred-Post: 83 %
FEV1-%Pred-Pre: 68 %
FEV1-Post: 2.04 L
FEV1-Pre: 1.68 L
FEV1FVC-%Change-Post: 4 %
FEV1FVC-%Pred-Pre: 105 %
FEV6-%Change-Post: 16 %
FEV6-%Pred-Post: 79 %
FEV6-%Pred-Pre: 68 %
FEV6-Post: 2.44 L
FEV6-Pre: 2.1 L
FEV6FVC-%Pred-Post: 104 %
FEV6FVC-%Pred-Pre: 104 %
FVC-%Change-Post: 16 %
FVC-%Pred-Post: 76 %
FVC-%Pred-Pre: 65 %
FVC-Post: 2.44 L
FVC-Pre: 2.1 L
Post FEV1/FVC ratio: 83 %
Post FEV6/FVC ratio: 100 %
Pre FEV1/FVC ratio: 80 %
Pre FEV6/FVC Ratio: 100 %

## 2017-02-19 LAB — CBC
HCT: 33.7 % — ABNORMAL LOW (ref 36.0–46.0)
Hemoglobin: 9.5 g/dL — ABNORMAL LOW (ref 12.0–15.0)
MCH: 21.8 pg — ABNORMAL LOW (ref 26.0–34.0)
MCHC: 28.2 g/dL — ABNORMAL LOW (ref 30.0–36.0)
MCV: 77.5 fL — ABNORMAL LOW (ref 78.0–100.0)
Platelets: 282 10*3/uL (ref 150–400)
RBC: 4.35 MIL/uL (ref 3.87–5.11)
RDW: 22.7 % — ABNORMAL HIGH (ref 11.5–15.5)
WBC: 7.7 10*3/uL (ref 4.0–10.5)

## 2017-02-19 SURGERY — LEFT HEART CATH AND CORONARY ANGIOGRAPHY
Anesthesia: LOCAL

## 2017-02-19 MED ORDER — HEPARIN SODIUM (PORCINE) 5000 UNIT/ML IJ SOLN
5000.0000 [IU] | Freq: Three times a day (TID) | INTRAMUSCULAR | Status: DC
  Administered 2017-02-19 – 2017-02-20 (×4): 5000 [IU] via SUBCUTANEOUS
  Filled 2017-02-19 (×4): qty 1

## 2017-02-19 MED ORDER — VERAPAMIL HCL 2.5 MG/ML IV SOLN
INTRAVENOUS | Status: AC
  Filled 2017-02-19: qty 2

## 2017-02-19 MED ORDER — VERAPAMIL HCL 2.5 MG/ML IV SOLN
INTRAVENOUS | Status: DC | PRN
  Administered 2017-02-19: 10 mL via INTRA_ARTERIAL

## 2017-02-19 MED ORDER — IOPAMIDOL (ISOVUE-370) INJECTION 76%
INTRAVENOUS | Status: DC | PRN
  Administered 2017-02-19: 160 mL via INTRAVENOUS

## 2017-02-19 MED ORDER — HEPARIN SODIUM (PORCINE) 1000 UNIT/ML IJ SOLN
INTRAMUSCULAR | Status: AC
  Filled 2017-02-19: qty 1

## 2017-02-19 MED ORDER — SODIUM CHLORIDE 0.9 % IV SOLN: 250.0000 mL | INTRAVENOUS | Status: DC | PRN

## 2017-02-19 MED ORDER — FENTANYL CITRATE (PF) 100 MCG/2ML IJ SOLN
INTRAMUSCULAR | Status: AC
  Filled 2017-02-19: qty 2

## 2017-02-19 MED ORDER — FENTANYL CITRATE (PF) 100 MCG/2ML IJ SOLN
INTRAMUSCULAR | Status: DC | PRN
  Administered 2017-02-19: 25 ug via INTRAVENOUS

## 2017-02-19 MED ORDER — HEPARIN SODIUM (PORCINE) 1000 UNIT/ML IJ SOLN
INTRAMUSCULAR | Status: DC | PRN
  Administered 2017-02-19: 5000 [IU] via INTRAVENOUS

## 2017-02-19 MED ORDER — MIDAZOLAM HCL 2 MG/2ML IJ SOLN
INTRAMUSCULAR | Status: AC
  Filled 2017-02-19: qty 2

## 2017-02-19 MED ORDER — LIDOCAINE HCL (PF) 1 % IJ SOLN
INTRAMUSCULAR | Status: AC
  Filled 2017-02-19: qty 30

## 2017-02-19 MED ORDER — IOPAMIDOL (ISOVUE-370) INJECTION 76%
INTRAVENOUS | Status: AC
  Filled 2017-02-19: qty 50

## 2017-02-19 MED ORDER — ALBUTEROL SULFATE (2.5 MG/3ML) 0.083% IN NEBU
2.5000 mg | INHALATION_SOLUTION | Freq: Once | RESPIRATORY_TRACT | Status: AC
  Administered 2017-02-19: 2.5 mg via RESPIRATORY_TRACT

## 2017-02-19 MED ORDER — NITROGLYCERIN IN D5W 200-5 MCG/ML-% IV SOLN
INTRAVENOUS | Status: AC
  Filled 2017-02-19: qty 250

## 2017-02-19 MED ORDER — SODIUM CHLORIDE 0.9% FLUSH: 3.0000 mL | INTRAVENOUS | Status: DC | PRN

## 2017-02-19 MED ORDER — HEPARIN (PORCINE) IN NACL 2-0.9 UNIT/ML-% IJ SOLN
INTRAMUSCULAR | Status: AC | PRN
  Administered 2017-02-19: 1000 mL

## 2017-02-19 MED ORDER — LIDOCAINE HCL (PF) 1 % IJ SOLN
INTRAMUSCULAR | Status: DC | PRN
  Administered 2017-02-19: 2 mL

## 2017-02-19 MED ORDER — NITROGLYCERIN 1 MG/10 ML FOR IR/CATH LAB
INTRA_ARTERIAL | Status: DC | PRN
  Administered 2017-02-19 (×3): 200 ug via INTRACORONARY

## 2017-02-19 MED ORDER — SODIUM CHLORIDE 0.9% FLUSH
3.0000 mL | Freq: Two times a day (BID) | INTRAVENOUS | Status: DC
  Administered 2017-02-19 – 2017-02-20 (×2): 3 mL via INTRAVENOUS

## 2017-02-19 MED ORDER — SODIUM CHLORIDE 0.9 % IV SOLN: INTRAVENOUS | Status: AC

## 2017-02-19 MED ORDER — HEPARIN (PORCINE) IN NACL 2-0.9 UNIT/ML-% IJ SOLN
INTRAMUSCULAR | Status: AC
  Filled 2017-02-19: qty 1000

## 2017-02-19 MED ORDER — MIDAZOLAM HCL 2 MG/2ML IJ SOLN
INTRAMUSCULAR | Status: DC | PRN
  Administered 2017-02-19: 1 mg via INTRAVENOUS

## 2017-02-19 SURGICAL SUPPLY — 12 items
CATH INFINITI 5 FR JL3.5 (CATHETERS) ×1 IMPLANT
CATH INFINITI 5 FR MPA2 (CATHETERS) ×1 IMPLANT
CATH INFINITI JR4 5F (CATHETERS) ×1 IMPLANT
CATH VISTA GUIDE 6FR XB3 SH (CATHETERS) ×1 IMPLANT
DEVICE RAD COMP TR BAND LRG (VASCULAR PRODUCTS) ×1 IMPLANT
GLIDESHEATH SLEND A-KIT 6F 22G (SHEATH) ×1 IMPLANT
GUIDEWIRE INQWIRE 1.5J.035X260 (WIRE) IMPLANT
INQWIRE 1.5J .035X260CM (WIRE) ×2
KIT HEART LEFT (KITS) ×2 IMPLANT
PACK CARDIAC CATHETERIZATION (CUSTOM PROCEDURE TRAY) ×2 IMPLANT
TRANSDUCER W/STOPCOCK (MISCELLANEOUS) ×2 IMPLANT
TUBING CIL FLEX 10 FLL-RA (TUBING) ×2 IMPLANT

## 2017-02-19 NOTE — Progress Notes (Signed)
[facility]                                                                              Patient Demographics  Jennifer [name], is a 66 y.o. female, DOB - [birth year], ZDG:[id number]  Admit date - 02/13/2017   Admitting Physician Karmen Bongo, MD  Outpatient Primary MD for the patient is Redmond School, MD  Outpatient specialists:   LOS - 6  days   Medical records reviewed and are as summarized below:    Chief Complaint  Patient presents with  . Emesis       Brief summary   Per admit note by Dr. Lorin Mercy on 12/4 Patient is a 66 year old female with hypertension, hyperlipidemia, DVT, nonobstructive CAD presented with nausea, vomiting, diarrhea and chest pain.  She reported lot of weak spells, nausea vomiting diarrhea in the last 4 days.  She reported the brown looking emesis but no frank hematemesis, hematochezia or melena.  Patient also reported dyspnea on exertion.  Troponin 9.96 on admission.   Assessment & Plan    Principal Problem:   NSTEMI (non-ST elevated myocardial infarction) ([place]) - troponin peaked at 11.3, now trending down -Cardiology was consulted, recommended to continue packed RBC transfusion, hold systemic anticoagulation -CT angiogram ruled out aortic dissection -2D echo showed EF of 60-65%, no regional wall motion abnormal dysfunction -Patient underwent cardiac catheterization today on 12/10 which showed significant multivessel CAD, recommended CTVS consultation for CABG.  Continue aggressive secondary prevention with aspirin and statin therapy.  Active Problems:   Symptomatic anemia -Hemoglobin 7.0 on admission, transfused 3 units packed RBCs -FOBT negative -H&H now stable, anemia panel showed iron deficiency, one episode of coffee-ground emesis.  GI was consulted.  Patient was placed on IV PPI, underwent EGD, colonoscopy reassuring, no lesions -Received 2 more packed RBC transfusion, 1 unit of Feraheme on 12/7, hemoglobin 10.5 -H&H  currently stable     Essential hypertension -Continue Imdur, hydralazine with parameters, follow cardiology recommendations     Hyperlipidemia -Continue Lipitor 40 mg at bedtime    Tobacco use disorder -Patient counseled on tobacco cessation, continue nicotine patch  History of PE/DVT -Xarelto on hold for cardiac cath   Code Status: Full CODE STATUS DVT Prophylaxis:   SCD's Family Communication: Discussed in detail with the patient, all imaging results, lab results explained to the patient   Disposition Plan: Will need CABG  Time Spent in minutes 25 minutes  Procedures:  EGD: Normal esophagus, stomach, duodenum Colonoscopy: 1 3 mm polyp in the ascending colon, resected, one 9 mm polyp in the sigmoid colon, diverticulosis in the left colon otherwise normal  Consultants:   Cardiology GI  Antimicrobials:      Medications  Scheduled Meds: . allopurinol  300 mg Oral Daily  . aspirin EC  81 mg Oral Daily  . atorvastatin  40 mg Oral Daily  . DULoxetine  30 mg Oral Daily  . gabapentin  300 mg Oral BID WC  . gabapentin  600 mg Oral QHS  . heparin  5,000 Units Subcutaneous Q8H  . isosorbide mononitrate  30 mg Oral Daily  . pantoprazole  40 mg Oral Q0600  .  sodium chloride flush  3 mL Intravenous Q12H  . sodium chloride flush  3 mL Intravenous Q12H   Continuous Infusions: . sodium chloride    . sodium chloride 250 mL (02/16/17 1235)  . sodium chloride 75 mL/hr at 02/19/17 0848  . sodium chloride     PRN Meds:.sodium chloride, sodium chloride, acetaminophen, hydrALAZINE, nitroGLYCERIN, ondansetron (ZOFRAN) IV, oxyCODONE, sodium chloride flush, sodium chloride flush   Antibiotics   Anti-infectives (From admission, onward)   None        Subjective:   Jennifer [name] was seen and examined today.  No specific complaints, currently no chest pain or shortness of breath.   Patient denies abdominal pain, N/V/D/C, focal weakness, numbess, tingling.      Objective:     Vitals:   02/19/17 0900 02/19/17 0930 02/19/17 1000 02/19/17 1030  BP: (!) 148/75 (!) 149/62 139/74   Pulse: (!) 58 (!) 57 (!) 59 (!) 57  Resp: 16 18 16 20   Temp:      TempSrc:      SpO2: 98% 97% 98% 98%  Weight:      Height:        Intake/Output Summary (Last 24 hours) at 02/19/2017 1244 Last data filed at 02/19/2017 0300 Gross per 24 hour  Intake 360 ml  Output -  Net 360 ml     Wt Readings from Last 3 Encounters:  02/16/17 95.7 kg (211 lb)  02/12/17 99.8 kg (220 lb)  01/23/17 99.3 kg (219 lb)     Exam    General: Alert and oriented x 3, NAD  Eyes:   HEENT:   Cardiovascular: S1 S2 auscultated, 2/6 SEM, RRR, no pedal edema b/l  Respiratory: Clear to auscultation bilaterally, no wheezing, rales or rhonchi  Gastrointestinal: Soft, nontender, nondistended, + bowel sounds  Ext: no pedal edema bilaterally  Neuro: no neuro deficits  Musculoskeletal: No digital cyanosis, clubbing  Skin: No rashes  Psych: Normal affect and demeanor, alert and oriented x3    Data Reviewed:  I have personally reviewed following labs and imaging studies  Micro Results Recent Results (from the past 240 hour(s))  MRSA PCR Screening     Status: None   Collection Time: 02/13/17 10:16 PM  Result Value Ref Range Status   MRSA by PCR NEGATIVE NEGATIVE Final    Comment:        The GeneXpert MRSA Assay (FDA approved for NASAL specimens only), is one component of a comprehensive MRSA colonization surveillance program. It is not intended to diagnose MRSA infection nor to guide or monitor treatment for MRSA infections.     Radiology Reports Dg Chest 2 View  Result Date: 02/13/2017 CLINICAL DATA:  emesis x4 days with increased reflux. --- diarrhea that started x2 days ago as well. PT states generalized malaise and poor appetite with body aches for weeks. EXAM: CHEST - 2 VIEW COMPARISON:  CT 01/06/2015 FINDINGS: Lungs are clear. Heart size upper limits normal.  Atheromatous  aorta. No effusion.  No pneumothorax. Visualized bones unremarkable. IMPRESSION: No acute cardiopulmonary disease. Aortic Atherosclerosis (ICD10-170.0) Electronically Signed   By: Lucrezia Europe M.D.   On: 02/13/2017 15:52   Ct Abdomen Pelvis W Contrast  Result Date: 02/13/2017 CLINICAL DATA:  Vomiting for several days with decreased appetite EXAM: CT ABDOMEN AND PELVIS WITH CONTRAST TECHNIQUE: Multidetector CT imaging of the abdomen and pelvis was performed using the standard protocol following bolus administration of intravenous contrast. CONTRAST:  159mL ISOVUE-300 IOPAMIDOL (ISOVUE-300) INJECTION 61% COMPARISON:  04/13/2016  FINDINGS: Lower chest: No acute abnormality. Hepatobiliary: No focal liver abnormality is seen. No gallstones, gallbladder wall thickening, or biliary dilatation. Pancreas: Unremarkable. No pancreatic ductal dilatation or surrounding inflammatory changes. Spleen: Normal in size without focal abnormality. Adrenals/Urinary Tract: The adrenal glands are within normal limits. Solitary left kidney is noted. No renal calculi or obstructive changes are noted. A simple cyst is noted in the upper pole stable from the prior exam. No obstructive changes are noted. The right kidney is severely shrunken and calcified stable from the prior exam. The bladder is well distended. Stomach/Bowel: Scattered diverticular change of the colon is noted. The colon is predominately decompressed. No inflammatory or obstructive changes are seen. The appendix is within normal limits. No obstructive or inflammatory changes in small bowel are seen. Vascular/Lymphatic: Aortic atherosclerosis. No enlarged abdominal or pelvic lymph nodes. Reproductive: Status post hysterectomy. No adnexal masses. Other: No abdominal wall hernia or abnormality. No abdominopelvic ascites. Musculoskeletal: Mild degenerative changes of lumbar spine are noted. No acute abnormality seen. IMPRESSION: Chronic changes as described above stable from the  prior exam. No acute abnormality to correspond with the patient's clinical symptomatology is noted. Electronically Signed   By: Inez Catalina M.D.   On: 02/13/2017 15:47   Ct Angio Chest Aorta W/cm &/or Wo/cm  Result Date: 02/13/2017 CLINICAL DATA:  Mid chest pressure today, complex chest pain, elevated troponins, question aortic dissection EXAM: CT ANGIOGRAPHY CHEST WITH CONTRAST TECHNIQUE: Multidetector CT imaging of the chest was performed using the standard protocol during bolus administration of intravenous contrast. Multiplanar CT image reconstructions and MIPs were obtained to evaluate the vascular anatomy. CONTRAST:  185mL ISOVUE-370 IOPAMIDOL (ISOVUE-370) INJECTION 76% IV COMPARISON:  01/06/2015 FINDINGS: Cardiovascular: Extensive atherosclerotic calcifications aorta, proximal great vessels and coronary arteries. Aorta normal caliber 3.5 cm transverse image 27. No intramural hematoma on precontrast imaging. No para-aortic hemorrhage. Following contrast, normal aortic enhancement is seen without aneurysm or dissection. Heart unremarkable. No pericardial effusion. Pulmonary arteries patent without evidence of pulmonary embolism. Mediastinum/Nodes: Esophagus unremarkable. No thoracic adenopathy. Base of cervical region normal appearance. Lungs/Pleura: Central peribronchial thickening. Lungs clear. No pulmonary infiltrate, pleural effusion or pneumothorax. Upper Abdomen: Tiny cyst upper pole LEFT kidney. Small splenules adjacent to spleen. Adrenal thickening without nodularity. Remaining visualized portions of upper abdomen unremarkable. Musculoskeletal: No acute abnormalities. Endplate spur formation lower thoracic spine. Review of the MIP images confirms the above findings. IMPRESSION: No evidence of aortic aneurysm or dissection. Central bronchitic changes. No acute intrathoracic abnormalities otherwise identified. Extensive atherosclerotic calcifications including in coronary arteries. Aortic  Atherosclerosis (ICD10-I70.0). Electronically Signed   By: Lavonia Dana M.D.   On: 02/13/2017 19:28    Lab Data:  CBC: Recent Labs  Lab 02/12/17 2010 02/13/17 1335  02/15/17 0402 02/16/17 0156 02/17/17 0208 02/18/17 0314 02/19/17 0222  WBC 9.1 11.2*   < > 7.6 7.7 8.9 9.3 7.7  NEUTROABS 7.1 8.4*  --   --   --   --   --   --   HGB 7.2* 7.0*   < > 8.1* 8.0* 10.5* 10.2* 9.5*  HCT 28.0* 27.1*   < > 28.4* 28.2* 35.6* 35.3* 33.7*  MCV 68.6* 69.8*   < > 72.6* 74.0* 75.4* 76.7* 77.5*  PLT 372 374   < > 231 242 276 301 282   < > = values in this interval not displayed.   Basic Metabolic Panel: Recent Labs  Lab 02/15/17 0402 02/16/17 0156 02/17/17 0208 02/18/17 0314 02/19/17 0222  NA 138 139  140 142 141  K 3.8 3.9 4.1 3.8 3.6  CL 106 106 106 107 106  CO2 27 26 27 28 27   GLUCOSE 113* 90 98 127* 112*  BUN 11 6 6  5* 7  CREATININE 1.28* 1.16* 1.03* 1.11* 1.04*  CALCIUM 8.2* 8.3* 8.5* 8.6* 8.4*   GFR: Estimated Creatinine Clearance: 60.1 mL/min (A) (by C-G formula based on SCr of 1.04 mg/dL (H)). Liver Function Tests: Recent Labs  Lab 02/12/17 2010 02/13/17 1335  AST 48* 71*  ALT 12* 16  ALKPHOS 76 72  BILITOT 0.4 0.6  PROT 6.4* 6.5  ALBUMIN 3.8 3.8   Recent Labs  Lab 02/13/17 1335  LIPASE 21   No results for input(s): AMMONIA in the last 168 hours. Coagulation Profile: Recent Labs  Lab 02/14/17 0841  INR 1.11   Cardiac Enzymes: Recent Labs  Lab 02/13/17 1335 02/13/17 1759 02/13/17 2135 02/14/17 0841  TROPONINI 9.96* 8.93* 11.36* 10.10*   BNP (last 3 results) No results for input(s): PROBNP in the last 8760 hours. HbA1C: No results for input(s): HGBA1C in the last 72 hours. CBG: No results for input(s): GLUCAP in the last 168 hours. Lipid Profile: No results for input(s): CHOL, HDL, LDLCALC, TRIG, CHOLHDL, LDLDIRECT in the last 72 hours. Thyroid Function Tests: No results for input(s): TSH, T4TOTAL, FREET4, T3FREE, THYROIDAB in the last 72  hours. Anemia Panel: No results for input(s): VITAMINB12, FOLATE, FERRITIN, TIBC, IRON, RETICCTPCT in the last 72 hours. Urine analysis:    Component Value Date/Time   COLORURINE YELLOW 10/26/2016 1139   APPEARANCEUR CLEAR 10/26/2016 1139   LABSPEC 1.013 10/26/2016 1139   PHURINE 5.0 10/26/2016 1139   GLUCOSEU NEGATIVE 10/26/2016 1139   HGBUR NEGATIVE 10/26/2016 [place] 10/26/2016 1139   KETONESUR NEGATIVE 10/26/2016 1139   PROTEINUR NEGATIVE 10/26/2016 1139   UROBILINOGEN 0.2 06/24/2010 1814   NITRITE NEGATIVE 10/26/2016 1139   LEUKOCYTESUR NEGATIVE 10/26/2016 1139     Myca Perno M.D. [facility] 02/19/2017, 12:44 PM  Pager: [phone] Between 7am to 7pm - call Pager - [phone]  After 7pm go to [email or web address] - password [email or web address]  Call night coverage person covering after 7pm

## 2017-02-19 NOTE — H&P (View-Only) (Signed)
[address]       [place],Jennifer [name] [id number]             [phone]        Jennifer [name] [place] Medical Record #[id number] Date of Birth: 09-21-1950  Referring: No ref. provider found Primary Care: Redmond School, MD  Chief Complaint:    Chief Complaint  Patient presents with  . Emesis    History of Present Illness: The patient is a 66 year old female with a history of multiple cardiac comorbidities including coronary artery disease, hypertension, hyperlipidemia, previous history of DVT/PE who presented to the emergency department at [facility] with nausea and vomiting of brown emesis.  She also has been having symptoms of significant fatigue and weakness with decreasing exercise tolerance over the past several weeks.  She also describes some chest discomfort that primarily felt like a burning sensation.  She was found to have a significant anemia with a hemoglobin of 7.0 and underwent transfusion.  She was found to have an elevated troponin of 9.96.  EKG showed sinus rhythm with nonspecific ST changes.  A CTA of the aorta and  abdomen and pelvis showed no acute abnormalities.  She was transferred to [facility] for further management and cardiology was consulted.  She underwent upper GI endoscopy. Findings on his exam were all normal.  She also had colonoscopy and they found 2 polyps per patient. I do not see the report.   She was cleared to proceed with further cardiology evaluation and catheterization was done today with findings noted below.  We are asked to see the patient in cardiothoracic surgical consultation for consideration of CABG. The patient has a 50-pack-year smoking history and states she has recently quit.  She also reports a 30 pound weight loss in approximately the last month primarily related to poor appetite.    Current Activity/ Functional Status: Patient is independent with mobility/ambulation, transfers, ADL's, IADL's.   Zubrod  Score: At the time of surgery this patient's most appropriate activity status/level should be described as: []     0    Normal activity, no symptoms [x]     1    Restricted in physical strenuous activity but ambulatory, able to do out light work []     2    Ambulatory and capable of self care, unable to do work activities, up and about                 more than 50%  Of the time                            []     3    Only limited self care, in bed greater than 50% of waking hours []     4    Completely disabled, no self care, confined to bed or chair []     5    Moribund  Past Medical History:  Diagnosis Date  . Anxiety   . Arthritis   . CAD (coronary artery disease)    a. Cath 12/2014 - mild-moderate non-obstructive CAD with 60% mid RCA, 50% mid Cx-distal Cx, 30% distal LAD, 25% D2, EF 55-65%.  . Depression   . Dizziness   . DVT (deep venous thrombosis) (HCC)    on Xarelto  . Dyspnea    W/ EXERTION  . GERD (gastroesophageal reflux disease)   . Headache   . Heart murmur  DX   AT BIRTH  . Hyperlipidemia   . Hypertension   . 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 Neuropathy   . PE (pulmonary embolism)   . Tobacco abuse     Past Surgical History:  Procedure Laterality Date  . ABDOMINAL HYSTERECTOMY  1974  . CARDIAC CATHETERIZATION N/A 01/08/2015   Procedure: Left Heart Cath and Coronary Angiography;  Surgeon: Jolaine Artist, MD;  Location: [place] [facility];  Service: Cardiovascular;  Laterality: N/A;  . CARPAL TUNNEL RELEASE     RIGHT  . COLONOSCOPY WITH PROPOFOL N/A 02/16/2017   Procedure: COLONOSCOPY WITH PROPOFOL;  Surgeon: [name], MD;  Location: [facility] ENDOSCOPY;  Service: Endoscopy;  Laterality: N/A;  . CORONARY STENT PLACEMENT Right 1980  . ESOPHAGOGASTRODUODENOSCOPY (EGD) WITH PROPOFOL N/A 02/16/2017   Procedure: ESOPHAGOGASTRODUODENOSCOPY (EGD) WITH PROPOFOL;  Surgeon: [name], MD;  Location: [facility] ENDOSCOPY;  Service: Endoscopy;  Laterality: N/A;   . KNEE ARTHROSCOPY W/ MENISCAL REPAIR     LEFT KNEE   FORMED DVT (SURG TO REMOVE BLOOD CLOT)  . LEFT HEART CATH AND CORONARY ANGIOGRAPHY N/A 02/19/2017   Procedure: LEFT HEART CATH AND CORONARY ANGIOGRAPHY;  Surgeon: Nelva Bush, MD;  Location: [place] [facility];  Service: Cardiovascular;  Laterality: N/A;  . TONSILLECTOMY      Social History   Tobacco Use  Smoking Status Current Every Day Smoker  . Packs/day: 1.00  . Years: 50.00  . Pack years: 50.00  . Types: Cigarettes  . Start date: 06/18/1967  Smokeless Tobacco Never Used  Tobacco Comment   Down to 1/2 pk per day.     Social History   Substance and Sexual Activity  Alcohol Use No  . Alcohol/week: 0.0 oz    Social History   Socioeconomic History  . Marital status: Divorced    Spouse name: Not on file  . Number of children: 2  . Years of education: 49  . Highest education level: Not on file  Social Needs  . Financial resource strain: Not on file  . Food insecurity - worry: Not on file  . Food insecurity - inability: Not on file  . Transportation needs - medical: Not on file  . Transportation needs - non-medical: Not on file  Occupational History  . Occupation: Retired  Tobacco Use  . Smoking status: Current Every Day Smoker    Packs/day: 1.00    Years: 50.00    Pack years: 50.00    Types: Cigarettes    Start date: 06/18/1967  . Smokeless tobacco: Never Used  . Tobacco comment: Down to 1/2 pk per day.   Substance and Sexual Activity  . Alcohol use: No    Alcohol/week: 0.0 oz  . Drug use: No  . Sexual activity: Not on file  Other Topics Concern  . Not on file  Social History Narrative   Lives at home alone.   Right-handed.   2 cups caffeine per day.    Allergies  Allergen Reactions  . Bee Venom Anaphylaxis    Current Facility-Administered Medications  Medication Dose Route Frequency Provider Last Rate Last Dose  . 0.9 %  sodium chloride infusion  10 mL/hr Intravenous Once Francine Graven,  DO      . 0.9 %  sodium chloride infusion  250 mL Intravenous PRN Karmen Bongo, MD 10 mL/hr at 02/16/17 1235 250 mL at 02/16/17 1235  . 0.9 %  sodium chloride infusion   Intravenous Continuous  Nelva Bush, MD 75 mL/hr at 02/19/17 0848    . 0.9 %  sodium chloride infusion  250 mL Intravenous PRN End, Harrell Gave, MD      . acetaminophen (TYLENOL) tablet 650 mg  650 mg Oral Q4H PRN Karmen Bongo, MD   650 mg at 02/17/17 0350  . allopurinol (ZYLOPRIM) tablet 300 mg  300 mg Oral Daily Karmen Bongo, MD   300 mg at 02/19/17 1141  . aspirin EC tablet 81 mg  81 mg Oral Daily Karmen Bongo, MD   81 mg at 02/19/17 1141  . atorvastatin (LIPITOR) tablet 40 mg  40 mg Oral Daily Karmen Bongo, MD   40 mg at 02/18/17 0947  . DULoxetine (CYMBALTA) DR capsule 30 mg  30 mg Oral Daily Karmen Bongo, MD   30 mg at 02/19/17 1141  . gabapentin (NEURONTIN) capsule 300 mg  300 mg Oral BID WC Karmen Bongo, MD   300 mg at 02/19/17 1141  . gabapentin (NEURONTIN) capsule 600 mg  600 mg Oral QHS Karmen Bongo, MD   600 mg at 02/18/17 2105  . heparin injection 5,000 Units  5,000 Units Subcutaneous Q8H End, Christopher, MD      . hydrALAZINE (APRESOLINE) injection 10 mg  10 mg Intravenous Q6H PRN Rai, Ripudeep K, MD      . isosorbide mononitrate (IMDUR) 24 hr tablet 30 mg  30 mg Oral Daily Rai, Ripudeep K, MD   30 mg at 02/19/17 1141  . nitroGLYCERIN (NITROSTAT) SL tablet 0.4 mg  0.4 mg Sublingual Q5 Min x 3 PRN Karmen Bongo, MD      . ondansetron [facility]) injection 4 mg  4 mg Intravenous Q6H PRN Karmen Bongo, MD      . oxyCODONE (Oxy IR/ROXICODONE) immediate release tablet 10 mg  10 mg Oral TID PRN Karmen Bongo, MD   10 mg at 02/18/17 2105  . pantoprazole (PROTONIX) EC tablet 40 mg  40 mg Oral Q0600 [name], MD   40 mg at 02/19/17 0504  . sodium chloride flush (NS) 0.9 % injection 3 mL  3 mL Intravenous Q12H Karmen Bongo, MD   3 mL at 02/18/17 2107  . sodium chloride flush (NS) 0.9  % injection 3 mL  3 mL Intravenous PRN Karmen Bongo, MD      . sodium chloride flush (NS) 0.9 % injection 3 mL  3 mL Intravenous Q12H End, Christopher, MD      . sodium chloride flush (NS) 0.9 % injection 3 mL  3 mL Intravenous PRN End, Christopher, MD        Medications Prior to Admission  Medication Sig Dispense Refill Last Dose  . allopurinol (ZYLOPRIM) 300 MG tablet Take 300 mg by mouth daily.   Past Week at Unknown time  . atorvastatin (LIPITOR) 10 MG tablet Take 1 tablet (10 mg total) by mouth daily. 30 tablet 6 Past Week at Unknown time  . calcium carbonate (TUMS - DOSED IN MG ELEMENTAL CALCIUM) 500 MG chewable tablet Chew 6 tablets by mouth 3 (three) times daily with meals.   02/12/2017 at Unknown time  . Cholecalciferol (VITAMIN D) 2000 units CAPS Take 2,000 Units by mouth daily.   Past Week at Unknown time  . DULoxetine (CYMBALTA) 30 MG capsule Take 30 mg by mouth daily.   Past Week at Unknown time  . gabapentin (NEURONTIN) 600 MG tablet Take 300-600 mg by mouth See admin instructions. Take 300 mg twice daily IN THE MORNING AND AT [facility] AND  600 mg at bedtime.   02/12/2017 at Unknown time  . Menthol-Methyl Salicylate (MUSCLE RUB) 10-15 % CREA Apply 1 application topically 2 (two) times daily as needed for muscle pain.   unknown  . Oxycodone HCl 10 MG TABS Take 1 tablet by mouth 3 (three) times daily as needed (back pain).    02/13/2017 at Unknown time  . ranitidine (ZANTAC) 150 MG tablet Take 150 mg by mouth daily as needed for heartburn.   unknown  . rivaroxaban (XARELTO) 20 MG TABS tablet Take 20 mg by mouth daily with supper.   02/13/2017 at 0100    Family History  Problem Relation Age of Onset  . Heart attack Mother   . Heart attack Father   . Heart attack Brother        CABG  . Heart attack Brother        CABG  . Heart attack Brother        CABG  . Ovarian cancer Maternal Grandmother   . Heart disease Maternal Grandfather   . Dementia Paternal Grandmother    Physicians    Panel Physicians Referring Physician Case Authorizing Physician  End, Harrell Gave, MD (Primary)    Procedures   LEFT HEART CATH AND CORONARY ANGIOGRAPHY  Conclusion   Conclusions: 1. Significant multivessel coronary artery disease, including 60% ostial LMCA stenosis with catheter dampening, 70% dominant OM stenosis, and subacute versus chronic occlusion of the mid RCA with faint bridging and left-to-right collaterals. 2. Normal left ventricular contraction with mildly elevated filling pressure.  Recommendations: 1. Cardiac surgery consultation for CABG. 2. Continue aggressive secondary prevention, including aspirin and statin therapy. We will defer adding heparin at this time, given that infarct occurred more than 48 hours ago, the patient is asymptomatic, and she presented with severe iron deficiency anemia.  Nelva Bush, MD [facility] Pager: [phone]   Indications   NSTEMI (non-ST elevated myocardial infarction) ([place]) [I21.4 (ICD-10-CM)]  Procedural Details/Technique   Technical Details Indication: 67 y.o. year-old woman with history of CAD, HTN, HLD, and prior DVT/PE, admitted with nausea, vomiting, and diarrhea with subsequent chest pain. She was found to be severely anemic with a hemoglobin of 7 and elevated troponin, peaking at 11. Hemoglobin has responded appropriately to PRBC transfusion 3. She has therefore been referred for left heart catheterization with possible PCI.  GFR: 54 ml/min  Procedure: The risks, benefits, complications, treatment options, and expected outcomes were discussed with the patient. The patient and/or family concurred with the proposed plan, giving informed consent. The patient was brought to the cath lab after IV hydration was begun and oral premedication was given. The patient was further sedated with Versed and Fentanyl. The right wrist was assessed with a modified Allens test which was normal. The right wrist was prepped and draped  in a sterile fashion. 1% lidocaine was used for local anesthesia. Using the modified Seldinger access technique, a 58F slender Glidesheath was placed in the right radial artery. 3 mg Verapamil was given through the sheath. Heparin 5,000 units were administered.  Selective coronary angiography was performed using 31F JL3.5 and JR4 catheters to engage the left and right coronary arteries, respectively. Additional images were obtained with 31F MPA 2 and 58F XB3.0 guide catheter with side holes to better visualize the ostium. Multiple doses of intracoronary and intra-aortic nitroglycerin were administered. Left heart catheterization was performed using a 31F JR4 catheter. Left ventriculogram was performed with a hand injection of contrast.  At the end of the procedure,  the radial artery sheath was removed and a TR band applied to achieve patent hemostasis. There were no immediate complications. The patient was taken to the recovery area in stable condition.  Contrast used: 160 mL Isovue Fluoroscopy time: 15.9 min  Radiation dose: 1,045 mGy   Estimated blood loss <50 mL.  During this procedure the patient was administered the following to achieve and maintain moderate conscious sedation: Versed 1 mg, Fentanyl 25 mcg, while the patient's heart rate, blood pressure, and oxygen saturation were continuously monitored. The period of conscious sedation was 49 minutes, of which I was present face-to-face 100% of this time.  Complications   Complications documented before study signed (02/19/2017 8:46 AM EST)    No complications were associated with this study.  Documented by Nelva Bush, MD - 02/19/2017 8:43 AM EST    Coronary Findings   Diagnostic  Dominance: Right  Left Main  Vessel is large.  Ost LM lesion 60% stenosed  Ost LM lesion is 60% stenosed. The lesion is focal. Pressure dampening noted with 4F diagonstic catheters. Stenosis persists following administration of multiple doses of  intracoronary and intraaortic nitroglycerin.  Left Anterior Descending  Prox LAD lesion 30% stenosed  Prox LAD lesion is 30% stenosed.  First Diagonal Branch  Vessel is small in size.  Second Diagonal Branch  Vessel is moderate in size.  Third Diagonal Branch  Vessel is small in size.  Left Circumflex  Vessel is large.  Prox Cx lesion 30% stenosed  Prox Cx lesion is 30% stenosed.  First Obtuse Marginal Branch  Vessel is small in size.  Second Obtuse Marginal Branch  Vessel is large in size.  2nd Mrg lesion 70% stenosed  2nd Mrg lesion is 70% stenosed. The lesion is irregular.  Third Obtuse Marginal Branch  Vessel is small in size.  Right Coronary Artery  Mid RCA lesion 100% stenosed  Mid RCA lesion is 100% stenosed. The lesion is thrombotic with bridging and left-to-right collateral flow. Subacute versus chronic occlusion.  Right Posterior Descending Artery  Collaterals  RPDA filled by collaterals from 2nd Sept.    Intervention   No interventions have been documented.  Wall Motion              Left Heart   Left Ventricle The left ventricular size is normal. The left ventricular systolic function is normal. LV end diastolic pressure is mildly elevated. The left ventricular ejection fraction is 55-65% by visual estimate. No regional wall motion abnormalities.  Aortic Valve There is no aortic valve stenosis.  Coronary Diagrams   Diagnostic Diagram       Implants     No implant documentation for this case.  MERGE Images   Show images for CARDIAC CATHETERIZATION   Link to Procedure Log   Procedure Log    Hemo Data    Most Recent Value  LV Systolic Pressure 664 mmHg  LV Diastolic Pressure 9 mmHg  LV EDP 23 mmHg  Arterial Occlusion Pressure Extended Systolic Pressure 403 mmHg  Arterial Occlusion Pressure Extended Diastolic Pressure 0 mmHg  Arterial Occlusion Pressure Extended Mean Pressure 77 mmHg  Left Ventricular Apex Extended Systolic Pressure 474  mmHg  Left Ventricular Apex Extended Diastolic Pressure 12 mmHg  Left Ventricular Apex Extended EDP Pressure 26 mmHg  Order-Level Documents - 02/13/2017:   Scan on 02/18/2017 11:56 AM by Default, Provider, MD  Scan on 02/16/2017 8:29 AM by [name], MD  Scan on 02/16/2017 8:22 AM by [name],  MD  Scan on 02/14/2017 11:21 AM by Default, Provider, MD      Encounter-Level Documents - 02/13/2017:   Scan on 02/19/2017 8:38 AM by Default, Provider, MD  Document on 02/17/2017 9:00 PM by Jenita Seashore, RN: ED PB Summary  Scan on 02/14/2017 11:02 AM by Default, Provider, MD  Scan on 02/14/2017 9:43 AM by Default, Provider, MD  Scan on 02/19/2017 3:41 AM by Default, Provider, MD  Scan on 02/19/2017 9:05 AM by Default, Provider, MD  Electronic signature on 02/13/2017 8:09 PM  Electronic signature on 02/13/2017 4:08 PM  Electronic signature on 02/13/2017 4:07 PM      Signed   Electronically signed by Nelva Bush, MD on 02/19/17 at 0846 EST    CLINICAL DATA:  Mid chest pressure today, complex chest pain, elevated troponins, question aortic dissection  EXAM: CT ANGIOGRAPHY CHEST WITH CONTRAST  TECHNIQUE: Multidetector CT imaging of the chest was performed using the standard protocol during bolus administration of intravenous contrast. Multiplanar CT image reconstructions and MIPs were obtained to evaluate the vascular anatomy.  CONTRAST:  112mL ISOVUE-370 IOPAMIDOL (ISOVUE-370) INJECTION 76% IV  COMPARISON:  01/06/2015  FINDINGS: Cardiovascular: Extensive atherosclerotic calcifications aorta, proximal great vessels and coronary arteries. Aorta normal caliber 3.5 cm transverse image 27. No intramural hematoma on precontrast imaging. No para-aortic hemorrhage. Following contrast, normal aortic enhancement is seen without aneurysm or dissection. Heart unremarkable. No pericardial effusion. Pulmonary arteries patent without evidence of pulmonary  embolism.  Mediastinum/Nodes: Esophagus unremarkable. No thoracic adenopathy. Base of cervical region normal appearance.  Lungs/Pleura: Central peribronchial thickening. Lungs clear. No pulmonary infiltrate, pleural effusion or pneumothorax.  Upper Abdomen: Tiny cyst upper pole LEFT kidney. Small splenules adjacent to spleen. Adrenal thickening without nodularity. Remaining visualized portions of upper abdomen unremarkable.  Musculoskeletal: No acute abnormalities. Endplate spur formation lower thoracic spine.  Review of the MIP images confirms the above findings.  IMPRESSION: No evidence of aortic aneurysm or dissection.  Central bronchitic changes.  No acute intrathoracic abnormalities otherwise identified.  Extensive atherosclerotic calcifications including in coronary arteries.  Aortic Atherosclerosis (ICD10-I70.0).   Electronically Signed   By: Lavonia Dana M.D.   On: 02/13/2017 19:28   Result status: Final result                              *[facility]*                         [address], [place] [address]                            [phone]  ------------------------------------------------------------------- Transthoracic Echocardiography  Patient:    Geraldy, Akridge MR #:       [id number] Study Date: 02/14/2017 Gender:     F Age:        29 Height:     165.1 cm Weight:     95.8 kg BSA:        2.14 m^2 Pt. Status: Room:       [place]    Karmen Bongo  [facility]  Lois Huxley  SONOGRAPHER  Dustin Flock, RCS  ATTENDING    Francine Graven [id number]  PERFORMING   [facility], Inpatient  cc:  ------------------------------------------------------------------- LV EF: 60% -   65%  ------------------------------------------------------------------- Indications:      Chest pain  786.51.  ------------------------------------------------------------------- History:   PMH:   Acute myocardial infarction.  Coronary artery disease.  Risk factors:  Current tobacco use. Hypertension. Dyslipidemia.  ------------------------------------------------------------------- Study Conclusions  - Left ventricle: The cavity size was normal. Wall thickness was   increased in a pattern of mild LVH. Systolic function was normal.   The estimated ejection fraction was in the range of 60% to 65%.   Wall motion was normal; there were no regional wall motion   abnormalities. Features are consistent with a pseudonormal left   ventricular filling pattern, with concomitant abnormal relaxation   and increased filling pressure (grade 2 diastolic dysfunction). - Aortic valve: Valve area (VTI): 1.92 cm^2. Valve area (Vmax):   1.91 cm^2. Valve area (Vmean): 1.55 cm^2. - Mitral valve: There was mild regurgitation.  ------------------------------------------------------------------- Study data:  Comparison was made to the study of 08/14/2016.  Study status:  Routine.  Procedure:  The patient reported no pain pre or post test. Transthoracic echocardiography. Image quality was adequate.  Study completion:  There were no complications. Transthoracic echocardiography.  M-mode, complete 2D, spectral Doppler, and color Doppler.  Birthdate:  Patient birthdate: Mar 22, 1950.  Age:  Patient is 66 yr old.  Sex:  Gender: female. BMI: 35.1 kg/m^2.  Blood pressure:     122/60  Patient status: Inpatient.  Study date:  Study date: 02/14/2017. Study time: 08:14 AM.  Location:  Bedside.  -------------------------------------------------------------------  ------------------------------------------------------------------- Left ventricle:  The cavity size was normal. Wall thickness was increased in a pattern of mild LVH. Systolic function was normal. The estimated ejection fraction was in the range of  60% to 65%. Wall motion was normal; there were no regional wall motion abnormalities. Features are consistent with a pseudonormal left ventricular filling pattern, with concomitant abnormal relaxation and increased filling pressure (grade 2 diastolic dysfunction).  ------------------------------------------------------------------- Aortic valve:   Structurally normal valve.   Cusp separation was normal.  Doppler:  Transvalvular velocity was within the normal range. There was no stenosis. There was no regurgitation.    VTI ratio of LVOT to aortic valve: 0.61. Valve area (VTI): 1.92 cm^2. Indexed valve area (VTI): 0.9 cm^2/m^2. Peak velocity ratio of LVOT to aortic valve: 0.61. Valve area (Vmax): 1.91 cm^2. Indexed valve area (Vmax): 0.89 cm^2/m^2. Mean velocity ratio of LVOT to aortic valve: 0.49. Valve area (Vmean): 1.55 cm^2. Indexed valve area (Vmean): 0.73 cm^2/m^2.    Mean gradient (S): 14 mm Hg. Peak gradient (S): 21 mm Hg.  ------------------------------------------------------------------- Aorta:  Aortic root: The aortic root was normal in size. Ascending aorta: The ascending aorta was normal in size.  ------------------------------------------------------------------- Mitral valve:   Structurally normal valve.   Leaflet separation was normal.  Doppler:  Transvalvular velocity was within the normal range. There was no evidence for stenosis. There was mild regurgitation.    Peak gradient (D): 6 mm Hg.  ------------------------------------------------------------------- Left atrium:  The atrium was normal in size.  ------------------------------------------------------------------- Right ventricle:  The cavity size was normal. Systolic function was normal.  ------------------------------------------------------------------- Pulmonic valve:    The valve appears to be grossly normal. Doppler:  There was no significant  regurgitation.  ------------------------------------------------------------------- Tricuspid valve:   The valve appears to be grossly normal. Doppler:  There was trivial regurgitation.  ------------------------------------------------------------------- Pulmonary artery:   Systolic pressure was within the normal range.   ------------------------------------------------------------------- Right atrium:  The atrium was normal in size.  ------------------------------------------------------------------- Pericardium:  There was no pericardial effusion.  ------------------------------------------------------------------- Systemic veins: Inferior vena cava: The vessel was dilated. The respirophasic diameter changes were in the normal range (>= 50%), consistent with elevated central venous pressure.  ------------------------------------------------------------------- Measurements   Left ventricle                           Value          Reference  LV ID, ED, PLAX chordal          (L)     40.8  mm       43 - 52  LV ID, ES, PLAX chordal                  29.3  mm       23 - 38  LV fx shortening, PLAX chordal   (L)     28    %        >=29  LV PW thickness, ED                      13    mm       ----------  IVS/LV PW ratio, ED                      1.03           <=1.3  Stroke volume, 2D                        95    ml       ----------  Stroke volume/bsa, 2D                    44    ml/m^2   ----------  LV e&', lateral                           6.39  cm/s     ----------  LV E/e&', lateral                         18.78          ----------  LV e&', medial                            5.63  cm/s     ----------  LV E/e&', medial                          21.31          ----------  LV e&', average                           6.01  cm/s     ----------  LV E/e&', average                         19.97          ----------    Ventricular septum  Value          Reference  IVS  thickness, ED                        13.4  mm       ----------    LVOT                                     Value          Reference  LVOT ID, S                               20    mm       ----------  LVOT area                                3.14  cm^2     ----------  LVOT peak velocity, S                    139   cm/s     ----------  LVOT mean velocity, S                    84.9  cm/s     ----------  LVOT VTI, S                              30.1  cm       ----------  LVOT peak gradient, S                    8     mm Hg    ----------    Aortic valve                             Value          Reference  Aortic valve peak velocity, S            228   cm/s     ----------  Aortic valve mean velocity, S            172   cm/s     ----------  Aortic valve VTI, S                      49.1  cm       ----------  Aortic mean gradient, S                  14    mm Hg    ----------  Aortic peak gradient, S                  21    mm Hg    ----------  VTI ratio, LVOT/AV                       0.61           ----------  Aortic valve area, VTI                   1.92  cm^2     ----------  Aortic valve area/bsa,  VTI               0.9   cm^2/m^2 ----------  Velocity ratio, peak, LVOT/AV            0.61           ----------  Aortic valve area, peak velocity         1.91  cm^2     ----------  Aortic valve area/bsa, peak              0.89  cm^2/m^2 ----------  velocity  Velocity ratio, mean, LVOT/AV            0.49           ----------  Aortic valve area, mean velocity         1.55  cm^2     ----------  Aortic valve area/bsa, mean              0.73  cm^2/m^2 ----------  velocity    Aorta                                    Value          Reference  Aortic root ID, ED                       25    mm       ----------    Left atrium                              Value          Reference  LA ID, A-P, ES                           39    mm       ----------  LA ID/bsa, A-P                           1.83  cm/m^2    <=2.2  LA volume, ES, 1-p A4C                   50.5  ml       ----------  LA volume/bsa, ES, 1-p A4C               23.6  ml/m^2   ----------  LA volume, ES, 1-p A2C                   46.8  ml       ----------  LA volume/bsa, ES, 1-p A2C               21.9  ml/m^2   ----------    Mitral valve                             Value          Reference  Mitral E-wave peak velocity              120   cm/s     ----------  Mitral A-wave peak velocity              110   cm/s     ----------  Mitral deceleration time         (H)     232   ms       150 - 230  Mitral peak gradient, D                  6     mm Hg    ----------  Mitral E/A ratio, peak                   1.1            ----------    Pulmonary arteries                       Value          Reference  PA pressure, S, DP                       26    mm Hg    <=30    Tricuspid valve                          Value          Reference  Tricuspid regurg peak velocity           240   cm/s     ----------  Tricuspid peak RV-RA gradient            23    mm Hg    ----------    Right atrium                             Value          Reference  RA ID, S-I, ES, A4C              (H)     54.8  mm       34 - 49  RA area, ES, A4C                         17    cm^2     8.3 - 19.5  RA volume, ES, A/L                       42.6  ml       ----------  RA volume/bsa, ES, A/L                   19.9  ml/m^2   ----------    Systemic veins                           Value          Reference  Estimated CVP                            3     mm Hg    ----------    Right ventricle                          Value          Reference  RV ID, minor axis, ED, A4C base          25.6  mm       ----------  TAPSE                                    19.8  mm       ----------  RV pressure, S, DP                       26    mm Hg    <=30  RV s&', lateral, S                        10.3  cm/s     ----------  Legend: (L)  and  (H)  mark values outside specified reference  range.  ------------------------------------------------------------------- Prepared and Electronically Authenticated by  Mertie Moores, M.D. 2018-12-05T10:01:11  MERGE Images   Show images for ECHOCARDIOGRAM COMPLETE     Review of Systems:   Review of Systems  Constitutional: Positive for diaphoresis, malaise/fatigue and weight loss. Negative for chills and fever.       Lost 30 pounds in last month, "just lost my appetite"  HENT: Negative for congestion, ear discharge, ear pain, hearing loss, nosebleeds, sinus pain, sore throat and tinnitus.   Eyes: Positive for blurred vision and photophobia. Negative for double vision, pain, discharge and redness.  Respiratory: Positive for sputum production, shortness of breath and wheezing. Negative for cough, hemoptysis and stridor.   Cardiovascular: Positive for chest pain, palpitations, orthopnea and leg swelling. Negative for claudication and PND.  Gastrointestinal: Positive for abdominal pain, diarrhea, heartburn, nausea and vomiting. Negative for blood in stool, constipation and melena.  Genitourinary: Negative for dysuria, flank pain, frequency, hematuria and urgency.       Congenital one kidney  Musculoskeletal: Positive for back pain, falls, myalgias and neck pain. Negative for joint pain.  Skin: Positive for itching. Negative for rash.  Neurological: Positive for dizziness, tingling, sensory change, loss of consciousness and weakness. Negative for tremors, focal weakness, seizures and headaches.  Endo/Heme/Allergies: Positive for polydipsia. Negative for environmental allergies. Does not bruise/bleed easily.       LOC x 3 before CEA was done earlier this year  Psychiatric/Behavioral: Positive for depression. Negative for hallucinations, memory loss, substance abuse and suicidal ideas. The patient is nervous/anxious. The patient does not have insomnia.        Suicide attempt in 2004 by taking a Bottle of sleeping pills   No recent  suicidal ideation     Physical Exam: BP 139/74   Pulse (!) 57   Temp 98.3 F (36.8 C) (Oral)   Resp 20   Ht 5\' 5"  (1.651 m)   Wt 211 lb (95.7 kg)   SpO2 98%   BMI 35.11 kg/m    Physical Exam  Constitutional: She appears chronically ill.  HENT:  Nose: No nasal discharge.  Mouth/Throat: Oropharynx is clear. Pharynx is normal.  Edentulous  Eyes: Conjunctivae are normal. Pupils are equal, round, and reactive to light.  Neck: Normal range of motion and thyroid normal. Neck supple. No JVD present. No neck adenopathy. No thyromegaly present.  Cardiovascular: Regular rhythm and normal pulses. Exam reveals no gallop.  Murmur heard.  Blowing holosystolic murmur is present with a grade of 2/6. Pulmonary/Chest: Breath sounds normal. She has no wheezes. She has no rales. She exhibits no tenderness.  Abdominal: Soft. Bowel sounds are normal. She exhibits no distension and no mass. There is no splenomegaly or hepatomegaly. There  is no tenderness. No hernia.  Musculoskeletal: She exhibits no edema, tenderness or deformity.  Neurological: She is alert and oriented to person, place, and time. She has normal motor skills.  Grossly intact  Skin: Skin is warm and dry. No rash noted. No cyanosis. No jaundice or pallor. Nails show no clubbing.    Diagnostic Studies & Laboratory data:     Recent Radiology Findings:   No results found.   I have independently reviewed the above radiologic studies.  Recent Lab Findings: Lab Results  Component Value Date   WBC 7.7 02/19/2017   HGB 9.5 (L) 02/19/2017   HCT 33.7 (L) 02/19/2017   PLT 282 02/19/2017   GLUCOSE 112 (H) 02/19/2017   CHOL 129 02/14/2017   TRIG 162 (H) 02/14/2017   HDL 30 (L) 02/14/2017   LDLCALC 67 02/14/2017   ALT 16 02/13/2017   AST 71 (H) 02/13/2017   NA 141 02/19/2017   K 3.6 02/19/2017   CL 106 02/19/2017   CREATININE 1.04 (H) 02/19/2017   BUN 7 02/19/2017   CO2 27 02/19/2017   TSH 1.480 06/20/2016   INR 1.11  02/14/2017   HGBA1C 5.5 06/20/2016      Assessment / Plan: 66 year old obese white female with multiple cardiac comorbidities as described above.  She has significant left main and multivessel coronary artery disease and would benefit from surgical revascularization.  Timing of surgery is yet to be determined and the patient will be evaluated by the surgeon.    I  spent 55 minutes counseling the patient face to face and 50% or more the  time was spent in counseling and coordination of care. The total time spent in the appointment was 40 minutes.    John Giovanni, PA-C 02/19/2017 11:47 AM  02/20/2017 Patient seen and examined, agree with above. 66 yo woman with history of obesity, tobacco abuse, hypertension, hyperlipidemia, and moderate CAD, presents with a non STEMI in setting of symptomatic anemia. Cath showed a 60% ostial left main and totally occluded RCA. Has a long standing heart murmur but significant valvular pathology on echo. Also history of DVt/PE in 2005.    CABG is indicated for survival benefit and relief of symptoms.  I have discussed the general nature of the procedure, the need for general anesthesia, the use of cardiopulmonary bypass, and the incisions to be used with Mrs. Menchaca. We discussed the expected hospital stay, overall recovery and short and long term outcomes. She understands this is a palliative procedure. She understands the risks include, but are not limited to death, stroke, MI, DVT/PE, bleeding, possible need for transfusion, infections, cardiac arrhythmias, as well as other organ system dysfunction including respiratory, renal, or GI complications.   She accepts the risks and agrees to proceed.  Tentatively plan for CABG on Thursday 12/13  Remo Lipps C. Roxan Hockey, MD [facility] [phone]

## 2017-02-19 NOTE — Consult Note (Signed)
[address]       [place] [address]             [phone]        Deberah K Babinski [place] Medical Record #[id number] Date of Birth: 18-Apr-1950  Referring: No ref. provider found Primary Care: Redmond School, MD  Chief Complaint:    Chief Complaint  Patient presents with  . Emesis    History of Present Illness: The patient is a 66 year old female with a history of multiple cardiac comorbidities including coronary artery disease, hypertension, hyperlipidemia, previous history of DVT/PE who presented to the emergency department at [facility] with nausea and vomiting of brown emesis.  She also has been having symptoms of significant fatigue and weakness with decreasing exercise tolerance over the past several weeks.  She also describes some chest discomfort that primarily felt like a burning sensation.  She was found to have a significant anemia with a hemoglobin of 7.0 and underwent transfusion.  She was found to have an elevated troponin of 9.96.  EKG showed sinus rhythm with nonspecific ST changes.  A CTA of the aorta and  abdomen and pelvis showed no acute abnormalities.  She was transferred to [facility] for further management and cardiology was consulted.  She underwent upper GI endoscopy. Findings on his exam were all normal.  She also had colonoscopy and they found 2 polyps per patient. I do not see the report.   She was cleared to proceed with further cardiology evaluation and catheterization was done today with findings noted below.  We are asked to see the patient in cardiothoracic surgical consultation for consideration of CABG. The patient has a 50-pack-year smoking history and states she has recently quit.  She also reports a 30 pound weight loss in approximately the last month primarily related to poor appetite.    Current Activity/ Functional Status: Patient is independent with mobility/ambulation, transfers, ADL's, IADL's.   Zubrod  Score: At the time of surgery this patient's most appropriate activity status/level should be described as: []     0    Normal activity, no symptoms [x]     1    Restricted in physical strenuous activity but ambulatory, able to do out light work []     2    Ambulatory and capable of self care, unable to do work activities, up and about                 more than 50%  Of the time                            []     3    Only limited self care, in bed greater than 50% of waking hours []     4    Completely disabled, no self care, confined to bed or chair []     5    Moribund  Past Medical History:  Diagnosis Date  . Anxiety   . Arthritis   . CAD (coronary artery disease)    a. Cath 12/2014 - mild-moderate non-obstructive CAD with 60% mid RCA, 50% mid Cx-distal Cx, 30% distal LAD, 25% D2, EF 55-65%.  . Depression   . Dizziness   . DVT (deep venous thrombosis) (HCC)    on Xarelto  . Dyspnea    W/ EXERTION  . GERD (gastroesophageal reflux disease)   . Headache   . Heart murmur  DX   AT BIRTH  . Hyperlipidemia   . Hypertension   . 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 Neuropathy   . PE (pulmonary embolism)   . Tobacco abuse     Past Surgical History:  Procedure Laterality Date  . ABDOMINAL HYSTERECTOMY  1974  . CARDIAC CATHETERIZATION N/A 01/08/2015   Procedure: Left Heart Cath and Coronary Angiography;  Surgeon: Jolaine Artist, MD;  Location: [place] CV [facility];  Service: Cardiovascular;  Laterality: N/A;  . CARPAL TUNNEL RELEASE     RIGHT  . COLONOSCOPY WITH PROPOFOL N/A 02/16/2017   Procedure: COLONOSCOPY WITH PROPOFOL;  Surgeon: [name], MD;  Location: [facility] ENDOSCOPY;  Service: Endoscopy;  Laterality: N/A;  . CORONARY STENT PLACEMENT Right 1980  . ESOPHAGOGASTRODUODENOSCOPY (EGD) WITH PROPOFOL N/A 02/16/2017   Procedure: ESOPHAGOGASTRODUODENOSCOPY (EGD) WITH PROPOFOL;  Surgeon: [name], MD;  Location: [facility] ENDOSCOPY;  Service: Endoscopy;  Laterality: N/A;   . KNEE ARTHROSCOPY W/ MENISCAL REPAIR     LEFT KNEE   FORMED DVT (SURG TO REMOVE BLOOD CLOT)  . LEFT HEART CATH AND CORONARY ANGIOGRAPHY N/A 02/19/2017   Procedure: LEFT HEART CATH AND CORONARY ANGIOGRAPHY;  Surgeon: Nelva Bush, MD;  Location: [place] [facility];  Service: Cardiovascular;  Laterality: N/A;  . TONSILLECTOMY      Social History   Tobacco Use  Smoking Status Current Every Day Smoker  . Packs/day: 1.00  . Years: 50.00  . Pack years: 50.00  . Types: Cigarettes  . Start date: 06/18/1967  Smokeless Tobacco Never Used  Tobacco Comment   Down to 1/2 pk per day.     Social History   Substance and Sexual Activity  Alcohol Use No  . Alcohol/week: 0.0 oz    Social History   Socioeconomic History  . Marital status: Divorced    Spouse name: Not on file  . Number of children: 2  . Years of education: 28  . Highest education level: Not on file  Social Needs  . Financial resource strain: Not on file  . Food insecurity - worry: Not on file  . Food insecurity - inability: Not on file  . Transportation needs - medical: Not on file  . Transportation needs - non-medical: Not on file  Occupational History  . Occupation: Retired  Tobacco Use  . Smoking status: Current Every Day Smoker    Packs/day: 1.00    Years: 50.00    Pack years: 50.00    Types: Cigarettes    Start date: 06/18/1967  . Smokeless tobacco: Never Used  . Tobacco comment: Down to 1/2 pk per day.   Substance and Sexual Activity  . Alcohol use: No    Alcohol/week: 0.0 oz  . Drug use: No  . Sexual activity: Not on file  Other Topics Concern  . Not on file  Social History Narrative   Lives at home alone.   Right-handed.   2 cups caffeine per day.    Allergies  Allergen Reactions  . Bee Venom Anaphylaxis    Current Facility-Administered Medications  Medication Dose Route Frequency Provider Last Rate Last Dose  . 0.9 %  sodium chloride infusion  10 mL/hr Intravenous Once Francine Graven,  DO      . 0.9 %  sodium chloride infusion  250 mL Intravenous PRN Karmen Bongo, MD 10 mL/hr at 02/16/17 1235 250 mL at 02/16/17 1235  . 0.9 %  sodium chloride infusion   Intravenous Continuous  Nelva Bush, MD 75 mL/hr at 02/19/17 0848    . 0.9 %  sodium chloride infusion  250 mL Intravenous PRN End, Harrell Gave, MD      . acetaminophen (TYLENOL) tablet 650 mg  650 mg Oral Q4H PRN Karmen Bongo, MD   650 mg at 02/17/17 0350  . allopurinol (ZYLOPRIM) tablet 300 mg  300 mg Oral Daily Karmen Bongo, MD   300 mg at 02/19/17 1141  . aspirin EC tablet 81 mg  81 mg Oral Daily Karmen Bongo, MD   81 mg at 02/19/17 1141  . atorvastatin (LIPITOR) tablet 40 mg  40 mg Oral Daily Karmen Bongo, MD   40 mg at 02/18/17 0947  . DULoxetine (CYMBALTA) DR capsule 30 mg  30 mg Oral Daily Karmen Bongo, MD   30 mg at 02/19/17 1141  . gabapentin (NEURONTIN) capsule 300 mg  300 mg Oral BID WC Karmen Bongo, MD   300 mg at 02/19/17 1141  . gabapentin (NEURONTIN) capsule 600 mg  600 mg Oral QHS Karmen Bongo, MD   600 mg at 02/18/17 2105  . heparin injection 5,000 Units  5,000 Units Subcutaneous Q8H End, Christopher, MD      . hydrALAZINE (APRESOLINE) injection 10 mg  10 mg Intravenous Q6H PRN Rai, Ripudeep K, MD      . isosorbide mononitrate (IMDUR) 24 hr tablet 30 mg  30 mg Oral Daily Rai, Ripudeep K, MD   30 mg at 02/19/17 1141  . nitroGLYCERIN (NITROSTAT) SL tablet 0.4 mg  0.4 mg Sublingual Q5 Min x 3 PRN Karmen Bongo, MD      . ondansetron [facility]) injection 4 mg  4 mg Intravenous Q6H PRN Karmen Bongo, MD      . oxyCODONE (Oxy IR/ROXICODONE) immediate release tablet 10 mg  10 mg Oral TID PRN Karmen Bongo, MD   10 mg at 02/18/17 2105  . pantoprazole (PROTONIX) EC tablet 40 mg  40 mg Oral Q0600 [name], MD   40 mg at 02/19/17 0504  . sodium chloride flush (NS) 0.9 % injection 3 mL  3 mL Intravenous Q12H Karmen Bongo, MD   3 mL at 02/18/17 2107  . sodium chloride flush (NS) 0.9  % injection 3 mL  3 mL Intravenous PRN Karmen Bongo, MD      . sodium chloride flush (NS) 0.9 % injection 3 mL  3 mL Intravenous Q12H End, Christopher, MD      . sodium chloride flush (NS) 0.9 % injection 3 mL  3 mL Intravenous PRN End, Christopher, MD        Medications Prior to Admission  Medication Sig Dispense Refill Last Dose  . allopurinol (ZYLOPRIM) 300 MG tablet Take 300 mg by mouth daily.   Past Week at Unknown time  . atorvastatin (LIPITOR) 10 MG tablet Take 1 tablet (10 mg total) by mouth daily. 30 tablet 6 Past Week at Unknown time  . calcium carbonate (TUMS - DOSED IN MG ELEMENTAL CALCIUM) 500 MG chewable tablet Chew 6 tablets by mouth 3 (three) times daily with meals.   02/12/2017 at Unknown time  . Cholecalciferol (VITAMIN D) 2000 units CAPS Take 2,000 Units by mouth daily.   Past Week at Unknown time  . DULoxetine (CYMBALTA) 30 MG capsule Take 30 mg by mouth daily.   Past Week at Unknown time  . gabapentin (NEURONTIN) 600 MG tablet Take 300-600 mg by mouth See admin instructions. Take 300 mg twice daily IN THE MORNING AND AT [facility] AND  600 mg at bedtime.   02/12/2017 at Unknown time  . Menthol-Methyl Salicylate (MUSCLE RUB) 10-15 % CREA Apply 1 application topically 2 (two) times daily as needed for muscle pain.   unknown  . Oxycodone HCl 10 MG TABS Take 1 tablet by mouth 3 (three) times daily as needed (back pain).    02/13/2017 at Unknown time  . ranitidine (ZANTAC) 150 MG tablet Take 150 mg by mouth daily as needed for heartburn.   unknown  . rivaroxaban (XARELTO) 20 MG TABS tablet Take 20 mg by mouth daily with supper.   02/13/2017 at 0100    Family History  Problem Relation Age of Onset  . Heart attack Mother   . Heart attack Father   . Heart attack Brother        CABG  . Heart attack Brother        CABG  . Heart attack Brother        CABG  . Ovarian cancer Maternal Grandmother   . Heart disease Maternal Grandfather   . Dementia Paternal Grandmother    Physicians    Panel Physicians Referring Physician Case Authorizing Physician  End, Harrell Gave, MD (Primary)    Procedures   LEFT HEART CATH AND CORONARY ANGIOGRAPHY  Conclusion   Conclusions: 1. Significant multivessel coronary artery disease, including 60% ostial LMCA stenosis with catheter dampening, 70% dominant OM stenosis, and subacute versus chronic occlusion of the mid RCA with faint bridging and left-to-right collaterals. 2. Normal left ventricular contraction with mildly elevated filling pressure.  Recommendations: 1. Cardiac surgery consultation for CABG. 2. Continue aggressive secondary prevention, including aspirin and statin therapy. We will defer adding heparin at this time, given that infarct occurred more than 48 hours ago, the patient is asymptomatic, and she presented with severe iron deficiency anemia.  Nelva Bush, MD [facility] Pager: [phone]   Indications   NSTEMI (non-ST elevated myocardial infarction) ([place]) [I21.4 (ICD-10-CM)]  Procedural Details/Technique   Technical Details Indication: 66 y.o. year-old woman with history of CAD, HTN, HLD, and prior DVT/PE, admitted with nausea, vomiting, and diarrhea with subsequent chest pain. She was found to be severely anemic with a hemoglobin of 7 and elevated troponin, peaking at 11. Hemoglobin has responded appropriately to PRBC transfusion 3. She has therefore been referred for left heart catheterization with possible PCI.  GFR: 54 ml/min  Procedure: The risks, benefits, complications, treatment options, and expected outcomes were discussed with the patient. The patient and/or family concurred with the proposed plan, giving informed consent. The patient was brought to the cath lab after IV hydration was begun and oral premedication was given. The patient was further sedated with Versed and Fentanyl. The right wrist was assessed with a modified Allens test which was normal. The right wrist was prepped and draped  in a sterile fashion. 1% lidocaine was used for local anesthesia. Using the modified Seldinger access technique, a 347F slender Glidesheath was placed in the right radial artery. 3 mg Verapamil was given through the sheath. Heparin 5,000 units were administered.  Selective coronary angiography was performed using 51F JL3.5 and JR4 catheters to engage the left and right coronary arteries, respectively. Additional images were obtained with 51F MPA 2 and 347F XB3.0 guide catheter with side holes to better visualize the ostium. Multiple doses of intracoronary and intra-aortic nitroglycerin were administered. Left heart catheterization was performed using a 51F JR4 catheter. Left ventriculogram was performed with a hand injection of contrast.  At the end of the procedure,  the radial artery sheath was removed and a TR band applied to achieve patent hemostasis. There were no immediate complications. The patient was taken to the recovery area in stable condition.  Contrast used: 160 mL Isovue Fluoroscopy time: 15.9 min  Radiation dose: 1,045 mGy   Estimated blood loss <50 mL.  During this procedure the patient was administered the following to achieve and maintain moderate conscious sedation: Versed 1 mg, Fentanyl 25 mcg, while the patient's heart rate, blood pressure, and oxygen saturation were continuously monitored. The period of conscious sedation was 49 minutes, of which I was present face-to-face 100% of this time.  Complications   Complications documented before study signed (02/19/2017 8:46 AM EST)    No complications were associated with this study.  Documented by Nelva Bush, MD - 02/19/2017 8:43 AM EST    Coronary Findings   Diagnostic  Dominance: Right  Left Main  Vessel is large.  Ost LM lesion 60% stenosed  Ost LM lesion is 60% stenosed. The lesion is focal. Pressure dampening noted with 33F diagonstic catheters. Stenosis persists following administration of multiple doses of  intracoronary and intraaortic nitroglycerin.  Left Anterior Descending  Prox LAD lesion 30% stenosed  Prox LAD lesion is 30% stenosed.  First Diagonal Branch  Vessel is small in size.  Second Diagonal Branch  Vessel is moderate in size.  Third Diagonal Branch  Vessel is small in size.  Left Circumflex  Vessel is large.  Prox Cx lesion 30% stenosed  Prox Cx lesion is 30% stenosed.  First Obtuse Marginal Branch  Vessel is small in size.  Second Obtuse Marginal Branch  Vessel is large in size.  2nd Mrg lesion 70% stenosed  2nd Mrg lesion is 70% stenosed. The lesion is irregular.  Third Obtuse Marginal Branch  Vessel is small in size.  Right Coronary Artery  Mid RCA lesion 100% stenosed  Mid RCA lesion is 100% stenosed. The lesion is thrombotic with bridging and left-to-right collateral flow. Subacute versus chronic occlusion.  Right Posterior Descending Artery  Collaterals  RPDA filled by collaterals from 2nd Sept.    Intervention   No interventions have been documented.  Wall Motion              Left Heart   Left Ventricle The left ventricular size is normal. The left ventricular systolic function is normal. LV end diastolic pressure is mildly elevated. The left ventricular ejection fraction is 55-65% by visual estimate. No regional wall motion abnormalities.  Aortic Valve There is no aortic valve stenosis.  Coronary Diagrams   Diagnostic Diagram       Implants     No implant documentation for this case.  MERGE Images   Show images for CARDIAC CATHETERIZATION   Link to Procedure Log   Procedure Log    Hemo Data    Most Recent Value  LV Systolic Pressure 099 mmHg  LV Diastolic Pressure 9 mmHg  LV EDP 23 mmHg  Arterial Occlusion Pressure Extended Systolic Pressure 833 mmHg  Arterial Occlusion Pressure Extended Diastolic Pressure 0 mmHg  Arterial Occlusion Pressure Extended Mean Pressure 77 mmHg  Left Ventricular Apex Extended Systolic Pressure 825  mmHg  Left Ventricular Apex Extended Diastolic Pressure 12 mmHg  Left Ventricular Apex Extended EDP Pressure 26 mmHg  Order-Level Documents - 02/13/2017:   Scan on 02/18/2017 11:56 AM by Default, Provider, MD  Scan on 02/16/2017 8:29 AM by [name], MD  Scan on 02/16/2017 8:22 AM by [name],  MD  Scan on 02/14/2017 11:21 AM by Default, Provider, MD      Encounter-Level Documents - 02/13/2017:   Scan on 02/19/2017 8:38 AM by Default, Provider, MD  Document on 02/17/2017 9:00 PM by Jenita Seashore, RN: ED PB Summary  Scan on 02/14/2017 11:02 AM by Default, Provider, MD  Scan on 02/14/2017 9:43 AM by Default, Provider, MD  Scan on 02/19/2017 3:41 AM by Default, Provider, MD  Scan on 02/19/2017 9:05 AM by Default, Provider, MD  Electronic signature on 02/13/2017 8:09 PM  Electronic signature on 02/13/2017 4:08 PM  Electronic signature on 02/13/2017 4:07 PM      Signed   Electronically signed by Nelva Bush, MD on 02/19/17 at 0846 EST    CLINICAL DATA:  Mid chest pressure today, complex chest pain, elevated troponins, question aortic dissection  EXAM: CT ANGIOGRAPHY CHEST WITH CONTRAST  TECHNIQUE: Multidetector CT imaging of the chest was performed using the standard protocol during bolus administration of intravenous contrast. Multiplanar CT image reconstructions and MIPs were obtained to evaluate the vascular anatomy.  CONTRAST:  17mL ISOVUE-370 IOPAMIDOL (ISOVUE-370) INJECTION 76% IV  COMPARISON:  01/06/2015  FINDINGS: Cardiovascular: Extensive atherosclerotic calcifications aorta, proximal great vessels and coronary arteries. Aorta normal caliber 3.5 cm transverse image 27. No intramural hematoma on precontrast imaging. No para-aortic hemorrhage. Following contrast, normal aortic enhancement is seen without aneurysm or dissection. Heart unremarkable. No pericardial effusion. Pulmonary arteries patent without evidence of pulmonary  embolism.  Mediastinum/Nodes: Esophagus unremarkable. No thoracic adenopathy. Base of cervical region normal appearance.  Lungs/Pleura: Central peribronchial thickening. Lungs clear. No pulmonary infiltrate, pleural effusion or pneumothorax.  Upper Abdomen: Tiny cyst upper pole LEFT kidney. Small splenules adjacent to spleen. Adrenal thickening without nodularity. Remaining visualized portions of upper abdomen unremarkable.  Musculoskeletal: No acute abnormalities. Endplate spur formation lower thoracic spine.  Review of the MIP images confirms the above findings.  IMPRESSION: No evidence of aortic aneurysm or dissection.  Central bronchitic changes.  No acute intrathoracic abnormalities otherwise identified.  Extensive atherosclerotic calcifications including in coronary arteries.  Aortic Atherosclerosis (ICD10-I70.0).   Electronically Signed   By: Lavonia Dana M.D.   On: 02/13/2017 19:28   Result status: Final result                              *[facility]*                         [address], [place] [address]                            [phone]  ------------------------------------------------------------------- Transthoracic Echocardiography  Patient:    Lace, Chenevert MR #:       [id number] Study Date: 02/14/2017 Gender:     F Age:        79 Height:     165.1 cm Weight:     95.8 kg BSA:        2.14 m^2 Pt. Status: Room:       [place]    Karmen Bongo  [facility]  Lois Huxley  SONOGRAPHER  Dustin Flock, RCS  ATTENDING    Francine Graven [id number]  PERFORMING   [facility], Inpatient  cc:  ------------------------------------------------------------------- LV EF: 60% -   65%  ------------------------------------------------------------------- Indications:      Chest pain  786.51.  ------------------------------------------------------------------- History:   PMH:   Acute myocardial infarction.  Coronary artery disease.  Risk factors:  Current tobacco use. Hypertension. Dyslipidemia.  ------------------------------------------------------------------- Study Conclusions  - Left ventricle: The cavity size was normal. Wall thickness was   increased in a pattern of mild LVH. Systolic function was normal.   The estimated ejection fraction was in the range of 60% to 65%.   Wall motion was normal; there were no regional wall motion   abnormalities. Features are consistent with a pseudonormal left   ventricular filling pattern, with concomitant abnormal relaxation   and increased filling pressure (grade 2 diastolic dysfunction). - Aortic valve: Valve area (VTI): 1.92 cm^2. Valve area (Vmax):   1.91 cm^2. Valve area (Vmean): 1.55 cm^2. - Mitral valve: There was mild regurgitation.  ------------------------------------------------------------------- Study data:  Comparison was made to the study of 08/14/2016.  Study status:  Routine.  Procedure:  The patient reported no pain pre or post test. Transthoracic echocardiography. Image quality was adequate.  Study completion:  There were no complications. Transthoracic echocardiography.  M-mode, complete 2D, spectral Doppler, and color Doppler.  Birthdate:  Patient birthdate: August 24, 1950.  Age:  Patient is 66 yr old.  Sex:  Gender: female. BMI: 35.1 kg/m^2.  Blood pressure:     122/60  Patient status: Inpatient.  Study date:  Study date: 02/14/2017. Study time: 08:14 AM.  Location:  Bedside.  -------------------------------------------------------------------  ------------------------------------------------------------------- Left ventricle:  The cavity size was normal. Wall thickness was increased in a pattern of mild LVH. Systolic function was normal. The estimated ejection fraction was in the range of  60% to 65%. Wall motion was normal; there were no regional wall motion abnormalities. Features are consistent with a pseudonormal left ventricular filling pattern, with concomitant abnormal relaxation and increased filling pressure (grade 2 diastolic dysfunction).  ------------------------------------------------------------------- Aortic valve:   Structurally normal valve.   Cusp separation was normal.  Doppler:  Transvalvular velocity was within the normal range. There was no stenosis. There was no regurgitation.    VTI ratio of LVOT to aortic valve: 0.61. Valve area (VTI): 1.92 cm^2. Indexed valve area (VTI): 0.9 cm^2/m^2. Peak velocity ratio of LVOT to aortic valve: 0.61. Valve area (Vmax): 1.91 cm^2. Indexed valve area (Vmax): 0.89 cm^2/m^2. Mean velocity ratio of LVOT to aortic valve: 0.49. Valve area (Vmean): 1.55 cm^2. Indexed valve area (Vmean): 0.73 cm^2/m^2.    Mean gradient (S): 14 mm Hg. Peak gradient (S): 21 mm Hg.  ------------------------------------------------------------------- Aorta:  Aortic root: The aortic root was normal in size. Ascending aorta: The ascending aorta was normal in size.  ------------------------------------------------------------------- Mitral valve:   Structurally normal valve.   Leaflet separation was normal.  Doppler:  Transvalvular velocity was within the normal range. There was no evidence for stenosis. There was mild regurgitation.    Peak gradient (D): 6 mm Hg.  ------------------------------------------------------------------- Left atrium:  The atrium was normal in size.  ------------------------------------------------------------------- Right ventricle:  The cavity size was normal. Systolic function was normal.  ------------------------------------------------------------------- Pulmonic valve:    The valve appears to be grossly normal. Doppler:  There was no significant  regurgitation.  ------------------------------------------------------------------- Tricuspid valve:   The valve appears to be grossly normal. Doppler:  There was trivial regurgitation.  ------------------------------------------------------------------- Pulmonary artery:   Systolic pressure was within the normal range.   ------------------------------------------------------------------- Right atrium:  The atrium was normal in size.  ------------------------------------------------------------------- Pericardium:  There was no pericardial effusion.  ------------------------------------------------------------------- Systemic veins: Inferior vena cava: The vessel was dilated. The respirophasic diameter changes were in the normal range (>= 50%), consistent with elevated central venous pressure.  ------------------------------------------------------------------- Measurements   Left ventricle                           Value          Reference  LV ID, ED, PLAX chordal          (L)     40.8  mm       43 - 52  LV ID, ES, PLAX chordal                  29.3  mm       23 - 38  LV fx shortening, PLAX chordal   (L)     28    %        >=29  LV PW thickness, ED                      13    mm       ----------  IVS/LV PW ratio, ED                      1.03           <=1.3  Stroke volume, 2D                        95    ml       ----------  Stroke volume/bsa, 2D                    44    ml/m^2   ----------  LV e&', lateral                           6.39  cm/s     ----------  LV E/e&', lateral                         18.78          ----------  LV e&', medial                            5.63  cm/s     ----------  LV E/e&', medial                          21.31          ----------  LV e&', average                           6.01  cm/s     ----------  LV E/e&', average                         19.97          ----------    Ventricular septum  Value          Reference  IVS  thickness, ED                        13.4  mm       ----------    LVOT                                     Value          Reference  LVOT ID, S                               20    mm       ----------  LVOT area                                3.14  cm^2     ----------  LVOT peak velocity, S                    139   cm/s     ----------  LVOT mean velocity, S                    84.9  cm/s     ----------  LVOT VTI, S                              30.1  cm       ----------  LVOT peak gradient, S                    8     mm Hg    ----------    Aortic valve                             Value          Reference  Aortic valve peak velocity, S            228   cm/s     ----------  Aortic valve mean velocity, S            172   cm/s     ----------  Aortic valve VTI, S                      49.1  cm       ----------  Aortic mean gradient, S                  14    mm Hg    ----------  Aortic peak gradient, S                  21    mm Hg    ----------  VTI ratio, LVOT/AV                       0.61           ----------  Aortic valve area, VTI                   1.92  cm^2     ----------  Aortic valve area/bsa,  VTI               0.9   cm^2/m^2 ----------  Velocity ratio, peak, LVOT/AV            0.61           ----------  Aortic valve area, peak velocity         1.91  cm^2     ----------  Aortic valve area/bsa, peak              0.89  cm^2/m^2 ----------  velocity  Velocity ratio, mean, LVOT/AV            0.49           ----------  Aortic valve area, mean velocity         1.55  cm^2     ----------  Aortic valve area/bsa, mean              0.73  cm^2/m^2 ----------  velocity    Aorta                                    Value          Reference  Aortic root ID, ED                       25    mm       ----------    Left atrium                              Value          Reference  LA ID, A-P, ES                           39    mm       ----------  LA ID/bsa, A-P                           1.83  cm/m^2    <=2.2  LA volume, ES, 1-p A4C                   50.5  ml       ----------  LA volume/bsa, ES, 1-p A4C               23.6  ml/m^2   ----------  LA volume, ES, 1-p A2C                   46.8  ml       ----------  LA volume/bsa, ES, 1-p A2C               21.9  ml/m^2   ----------    Mitral valve                             Value          Reference  Mitral E-wave peak velocity              120   cm/s     ----------  Mitral A-wave peak velocity              110   cm/s     ----------  Mitral deceleration time         (H)     232   ms       150 - 230  Mitral peak gradient, D                  6     mm Hg    ----------  Mitral E/A ratio, peak                   1.1            ----------    Pulmonary arteries                       Value          Reference  PA pressure, S, DP                       26    mm Hg    <=30    Tricuspid valve                          Value          Reference  Tricuspid regurg peak velocity           240   cm/s     ----------  Tricuspid peak RV-RA gradient            23    mm Hg    ----------    Right atrium                             Value          Reference  RA ID, S-I, ES, A4C              (H)     54.8  mm       34 - 49  RA area, ES, A4C                         17    cm^2     8.3 - 19.5  RA volume, ES, A/L                       42.6  ml       ----------  RA volume/bsa, ES, A/L                   19.9  ml/m^2   ----------    Systemic veins                           Value          Reference  Estimated CVP                            3     mm Hg    ----------    Right ventricle                          Value          Reference  RV ID, minor axis, ED, A4C base          25.6  mm       ----------  TAPSE                                    19.8  mm       ----------  RV pressure, S, DP                       26    mm Hg    <=30  RV s&', lateral, S                        10.3  cm/s     ----------  Legend: (L)  and  (H)  mark values outside specified reference  range.  ------------------------------------------------------------------- Prepared and Electronically Authenticated by  Mertie Moores, M.D. 2018-12-05T10:01:11  MERGE Images   Show images for ECHOCARDIOGRAM COMPLETE     Review of Systems:   Review of Systems  Constitutional: Positive for diaphoresis, malaise/fatigue and weight loss. Negative for chills and fever.       Lost 30 pounds in last month, "just lost my appetite"  HENT: Negative for congestion, ear discharge, ear pain, hearing loss, nosebleeds, sinus pain, sore throat and tinnitus.   Eyes: Positive for blurred vision and photophobia. Negative for double vision, pain, discharge and redness.  Respiratory: Positive for sputum production, shortness of breath and wheezing. Negative for cough, hemoptysis and stridor.   Cardiovascular: Positive for chest pain, palpitations, orthopnea and leg swelling. Negative for claudication and PND.  Gastrointestinal: Positive for abdominal pain, diarrhea, heartburn, nausea and vomiting. Negative for blood in stool, constipation and melena.  Genitourinary: Negative for dysuria, flank pain, frequency, hematuria and urgency.       Congenital one kidney  Musculoskeletal: Positive for back pain, falls, myalgias and neck pain. Negative for joint pain.  Skin: Positive for itching. Negative for rash.  Neurological: Positive for dizziness, tingling, sensory change, loss of consciousness and weakness. Negative for tremors, focal weakness, seizures and headaches.  Endo/Heme/Allergies: Positive for polydipsia. Negative for environmental allergies. Does not bruise/bleed easily.       LOC x 3 before CEA was done earlier this year  Psychiatric/Behavioral: Positive for depression. Negative for hallucinations, memory loss, substance abuse and suicidal ideas. The patient is nervous/anxious. The patient does not have insomnia.        Suicide attempt in 2004 by taking a Bottle of sleeping pills   No recent  suicidal ideation     Physical Exam: BP 139/74   Pulse (!) 57   Temp 98.3 F (36.8 C) (Oral)   Resp 20   Ht 5\' 5"  (1.651 m)   Wt 211 lb (95.7 kg)   SpO2 98%   BMI 35.11 kg/m    Physical Exam  Constitutional: She appears chronically ill.  HENT:  Nose: No nasal discharge.  Mouth/Throat: Oropharynx is clear. Pharynx is normal.  Edentulous  Eyes: Conjunctivae are normal. Pupils are equal, round, and reactive to light.  Neck: Normal range of motion and thyroid normal. Neck supple. No JVD present. No neck adenopathy. No thyromegaly present.  Cardiovascular: Regular rhythm and normal pulses. Exam reveals no gallop.  Murmur heard.  Blowing holosystolic murmur is present with a grade of 2/6. Pulmonary/Chest: Breath sounds normal. She has no wheezes. She has no rales. She exhibits no tenderness.  Abdominal: Soft. Bowel sounds are normal. She exhibits no distension and no mass. There is no splenomegaly or hepatomegaly. There  is no tenderness. No hernia.  Musculoskeletal: She exhibits no edema, tenderness or deformity.  Neurological: She is alert and oriented to person, place, and time. She has normal motor skills.  Grossly intact  Skin: Skin is warm and dry. No rash noted. No cyanosis. No jaundice or pallor. Nails show no clubbing.    Diagnostic Studies & Laboratory data:     Recent Radiology Findings:   No results found.   I have independently reviewed the above radiologic studies.  Recent Lab Findings: Lab Results  Component Value Date   WBC 7.7 02/19/2017   HGB 9.5 (L) 02/19/2017   HCT 33.7 (L) 02/19/2017   PLT 282 02/19/2017   GLUCOSE 112 (H) 02/19/2017   CHOL 129 02/14/2017   TRIG 162 (H) 02/14/2017   HDL 30 (L) 02/14/2017   LDLCALC 67 02/14/2017   ALT 16 02/13/2017   AST 71 (H) 02/13/2017   NA 141 02/19/2017   K 3.6 02/19/2017   CL 106 02/19/2017   CREATININE 1.04 (H) 02/19/2017   BUN 7 02/19/2017   CO2 27 02/19/2017   TSH 1.480 06/20/2016   INR 1.11  02/14/2017   HGBA1C 5.5 06/20/2016      Assessment / Plan: 66 year old obese white female with multiple cardiac comorbidities as described above.  She has significant left main and multivessel coronary artery disease and would benefit from surgical revascularization.  Timing of surgery is yet to be determined and the patient will be evaluated by the surgeon.    I  spent 55 minutes counseling the patient face to face and 50% or more the  time was spent in counseling and coordination of care. The total time spent in the appointment was 40 minutes.    John Giovanni, PA-C 02/19/2017 11:47 AM  02/20/2017 Patient seen and examined, agree with above. 66 yo woman with history of obesity, tobacco abuse, hypertension, hyperlipidemia, and moderate CAD, presents with a non STEMI in setting of symptomatic anemia. Cath showed a 60% ostial left main and totally occluded RCA. Has a long standing heart murmur but significant valvular pathology on echo. Also history of DVt/PE in 2005.    CABG is indicated for survival benefit and relief of symptoms.  I have discussed the general nature of the procedure, the need for general anesthesia, the use of cardiopulmonary bypass, and the incisions to be used with Mrs. Phoenix. We discussed the expected hospital stay, overall recovery and short and long term outcomes. She understands this is a palliative procedure. She understands the risks include, but are not limited to death, stroke, MI, DVT/PE, bleeding, possible need for transfusion, infections, cardiac arrhythmias, as well as other organ system dysfunction including respiratory, renal, or GI complications.   She accepts the risks and agrees to proceed.  Tentatively plan for CABG on Thursday 12/13  Remo Lipps C. Roxan Hockey, MD [facility] [phone]

## 2017-02-19 NOTE — Interval H&P Note (Signed)
History and Physical Interval Note:  02/19/2017 7:11 AM  Jennifer [name]  has presented today for cardiac catheterization, with the diagnosis of NSTEMI. The various methods of treatment have been discussed with the patient and family. After consideration of risks, benefits and other options for treatment, the patient has consented to  Procedure(s): LEFT HEART CATH AND CORONARY ANGIOGRAPHY (N/A) as a surgical intervention .  The patient's history has been reviewed, patient examined, no change in status, stable for surgery.  I have reviewed the patient's chart and labs.  Questions were answered to the patient's satisfaction.    Cath Lab Visit (complete for each Cath Lab visit)  Clinical Evaluation Leading to the Procedure:   ACS: Yes.    Non-ACS:  N/A  [name]

## 2017-02-20 ENCOUNTER — Inpatient Hospital Stay (HOSPITAL_COMMUNITY): Payer: Medicare Other

## 2017-02-20 DIAGNOSIS — Z01810 Encounter for preprocedural cardiovascular examination: Secondary | ICD-10-CM

## 2017-02-20 LAB — BASIC METABOLIC PANEL
Anion gap: 7 (ref 5–15)
BUN: 8 mg/dL (ref 6–20)
CO2: 26 mmol/L (ref 22–32)
Calcium: 8.4 mg/dL — ABNORMAL LOW (ref 8.9–10.3)
Chloride: 105 mmol/L (ref 101–111)
Creatinine, Ser: 0.98 mg/dL (ref 0.44–1.00)
GFR calc Af Amer: >60 mL/min (ref >60)
GFR calc non Af Amer: 58 mL/min — ABNORMAL LOW (ref >60)
Glucose, Bld: 93 mg/dL (ref 65–99)
Potassium: 3.7 mmol/L (ref 3.5–5.1)
Sodium: 138 mmol/L (ref 135–145)

## 2017-02-20 LAB — URINALYSIS, COMPLETE (UACMP) WITH MICROSCOPIC
Bilirubin Urine: NEGATIVE
Glucose, UA: NEGATIVE mg/dL
Hgb urine dipstick: NEGATIVE
Ketones, ur: NEGATIVE mg/dL
Leukocytes, UA: NEGATIVE
Nitrite: NEGATIVE
Protein, ur: NEGATIVE mg/dL
Specific Gravity, Urine: 1.009 (ref 1.005–1.030)
Squamous Epithelial / LPF: NONE SEEN
pH: 7 (ref 5.0–8.0)

## 2017-02-20 LAB — SURGICAL PCR SCREEN
MRSA, PCR: NEGATIVE
Staphylococcus aureus: NEGATIVE

## 2017-02-20 LAB — CBC
HCT: 33.3 % — ABNORMAL LOW (ref 36.0–46.0)
Hemoglobin: 9.8 g/dL — ABNORMAL LOW (ref 12.0–15.0)
MCH: 22.5 pg — ABNORMAL LOW (ref 26.0–34.0)
MCHC: 29.4 g/dL — ABNORMAL LOW (ref 30.0–36.0)
MCV: 76.6 fL — ABNORMAL LOW (ref 78.0–100.0)
Platelets: 291 10*3/uL (ref 150–400)
RBC: 4.35 MIL/uL (ref 3.87–5.11)
RDW: 23.1 % — ABNORMAL HIGH (ref 11.5–15.5)
WBC: 8.3 10*3/uL (ref 4.0–10.5)

## 2017-02-20 LAB — BLOOD GAS, ARTERIAL
Acid-Base Excess: 1.9 mmol/L (ref 0.0–2.0)
Bicarbonate: 26 mmol/L (ref 20.0–28.0)
Drawn by: 12971
FIO2: 21
O2 Saturation: 96.6 %
Patient temperature: 98.6
pCO2 arterial: 40.6 mmHg (ref 32.0–48.0)
pH, Arterial: 7.422 (ref 7.350–7.450)
pO2, Arterial: 84.6 mmHg (ref 83.0–108.0)

## 2017-02-20 LAB — HEMOGLOBIN A1C
Hgb A1c MFr Bld: 5.2 % (ref 4.8–5.6)
Mean Plasma Glucose: 102.54 mg/dL

## 2017-02-20 MED ORDER — PLASMA-LYTE 148 IV SOLN
INTRAVENOUS | Status: DC
  Filled 2017-02-20 (×2): qty 2.5

## 2017-02-20 MED ORDER — BISACODYL 5 MG PO TBEC: 5.0000 mg | DELAYED_RELEASE_TABLET | Freq: Once | ORAL | Status: DC

## 2017-02-20 MED ORDER — POTASSIUM CHLORIDE 2 MEQ/ML IV SOLN
80.0000 meq | INTRAVENOUS | Status: DC
  Filled 2017-02-20 (×2): qty 40

## 2017-02-20 MED ORDER — CHLORHEXIDINE GLUCONATE CLOTH 2 % EX PADS: 6.0000 | MEDICATED_PAD | Freq: Once | CUTANEOUS | Status: DC

## 2017-02-20 MED ORDER — TRANEXAMIC ACID 1000 MG/10ML IV SOLN
1.5000 mg/kg/h | INTRAVENOUS | Status: DC
  Administered 2017-02-21: 1.5 mg/kg/h via INTRAVENOUS
  Filled 2017-02-20 (×2): qty 25

## 2017-02-20 MED ORDER — METOPROLOL TARTRATE 12.5 MG HALF TABLET
12.5000 mg | ORAL_TABLET | Freq: Once | ORAL | Status: AC
  Administered 2017-02-21: 12.5 mg via ORAL
  Filled 2017-02-20: qty 1

## 2017-02-20 MED ORDER — VANCOMYCIN HCL 10 G IV SOLR
1500.0000 mg | INTRAVENOUS | Status: DC
  Administered 2017-02-21: 1500 mg via INTRAVENOUS
  Filled 2017-02-20 (×2): qty 1500

## 2017-02-20 MED ORDER — CHLORHEXIDINE GLUCONATE CLOTH 2 % EX PADS
6.0000 | MEDICATED_PAD | Freq: Once | CUTANEOUS | Status: AC
  Administered 2017-02-20: 6 via TOPICAL

## 2017-02-20 MED ORDER — DEXTROSE 5 % IV SOLN
1.5000 g | INTRAVENOUS | Status: DC
  Administered 2017-02-21: 1.5 g via INTRAVENOUS
  Administered 2017-02-21: .75 g via INTRAVENOUS
  Filled 2017-02-20 (×2): qty 1.5

## 2017-02-20 MED ORDER — TRANEXAMIC ACID (OHS) BOLUS VIA INFUSION
15.0000 mg/kg | INTRAVENOUS | Status: DC
  Administered 2017-02-21: 1435.5 mg via INTRAVENOUS
  Filled 2017-02-20: qty 1436

## 2017-02-20 MED ORDER — EPINEPHRINE PF 1 MG/ML IJ SOLN
0.0000 ug/min | INTRAVENOUS | Status: DC
  Filled 2017-02-20 (×2): qty 4

## 2017-02-20 MED ORDER — SODIUM CHLORIDE 0.9 % IV SOLN
30.0000 ug/min | INTRAVENOUS | Status: DC
  Administered 2017-02-21: 15 ug/min via INTRAVENOUS
  Filled 2017-02-20 (×2): qty 2

## 2017-02-20 MED ORDER — DOPAMINE-DEXTROSE 3.2-5 MG/ML-% IV SOLN
0.0000 ug/kg/min | INTRAVENOUS | Status: DC
  Filled 2017-02-20 (×2): qty 250

## 2017-02-20 MED ORDER — DEXMEDETOMIDINE HCL IN NACL 400 MCG/100ML IV SOLN
0.1000 ug/kg/h | INTRAVENOUS | Status: DC
  Administered 2017-02-21: .3 ug/kg/h via INTRAVENOUS
  Filled 2017-02-20 (×2): qty 100

## 2017-02-20 MED ORDER — SODIUM CHLORIDE 0.9 % IV SOLN
INTRAVENOUS | Status: DC
  Administered 2017-02-21: .6 [IU]/h via INTRAVENOUS
  Filled 2017-02-20 (×2): qty 1

## 2017-02-20 MED ORDER — SODIUM CHLORIDE 0.9 % IV SOLN
INTRAVENOUS | Status: DC
  Filled 2017-02-20 (×2): qty 30

## 2017-02-20 MED ORDER — MAGNESIUM SULFATE 50 % IJ SOLN
40.0000 meq | INTRAMUSCULAR | Status: DC
  Filled 2017-02-20 (×5): qty 9.85

## 2017-02-20 MED ORDER — TEMAZEPAM 7.5 MG PO CAPS: 15.0000 mg | ORAL_CAPSULE | Freq: Once | ORAL | Status: DC | PRN

## 2017-02-20 MED ORDER — TRANEXAMIC ACID (OHS) PUMP PRIME SOLUTION
2.0000 mg/kg | INTRAVENOUS | Status: DC
  Filled 2017-02-20 (×2): qty 1.91

## 2017-02-20 MED ORDER — DIAZEPAM 2 MG PO TABS
2.0000 mg | ORAL_TABLET | Freq: Once | ORAL | Status: AC
  Administered 2017-02-21: 2 mg via ORAL
  Filled 2017-02-20: qty 1

## 2017-02-20 MED ORDER — CHLORHEXIDINE GLUCONATE 0.12 % MT SOLN
15.0000 mL | Freq: Once | OROMUCOSAL | Status: AC
  Administered 2017-02-21: 15 mL via OROMUCOSAL
  Filled 2017-02-20: qty 15

## 2017-02-20 MED ORDER — NITROGLYCERIN IN D5W 200-5 MCG/ML-% IV SOLN
2.0000 ug/min | INTRAVENOUS | Status: DC
  Administered 2017-02-21: 09:00:00 via INTRAVENOUS
  Filled 2017-02-20: qty 250

## 2017-02-20 MED ORDER — DEXTROSE 5 % IV SOLN
750.0000 mg | INTRAVENOUS | Status: DC
  Filled 2017-02-20 (×2): qty 750

## 2017-02-20 MED ORDER — AMLODIPINE BESYLATE 5 MG PO TABS
5.0000 mg | ORAL_TABLET | Freq: Every day | ORAL | Status: DC
  Administered 2017-02-20: 5 mg via ORAL
  Filled 2017-02-20: qty 1

## 2017-02-20 NOTE — Progress Notes (Signed)
Pre-CABG testing has been completed.  02/20/17 4:45 PM Jennifer [name] RVT

## 2017-02-20 NOTE — Progress Notes (Signed)
Progress Note  Patient Name: Jennifer [name] Date of Encounter: 02/20/2017  Primary Cardiologist:  Dr Bronson Ing  Subjective   A little anxious about surgery, wants to know timing.  Inpatient Medications    Scheduled Meds: . allopurinol  300 mg Oral Daily  . aspirin EC  81 mg Oral Daily  . atorvastatin  40 mg Oral Daily  . DULoxetine  30 mg Oral Daily  . gabapentin  300 mg Oral BID WC  . gabapentin  600 mg Oral QHS  . heparin  5,000 Units Subcutaneous Q8H  . isosorbide mononitrate  30 mg Oral Daily  . pantoprazole  40 mg Oral Q0600  . sodium chloride flush  3 mL Intravenous Q12H  . sodium chloride flush  3 mL Intravenous Q12H   Continuous Infusions: . sodium chloride    . sodium chloride 250 mL (02/16/17 1235)  . sodium chloride     PRN Meds: sodium chloride, sodium chloride, acetaminophen, hydrALAZINE, nitroGLYCERIN, ondansetron (ZOFRAN) IV, oxyCODONE, sodium chloride flush, sodium chloride flush   Vital Signs    Vitals:   02/19/17 2100 02/20/17 0015 02/20/17 0418 02/20/17 0818  BP: (!) 160/54 (!) 151/69 (!) 156/77 (!) 143/51  Pulse:  65 73 75  Resp:  18 18 16   Temp:  98.5 F (36.9 C) 98.2 F (36.8 C) 98.1 F (36.7 C)  TempSrc:  Oral Oral Oral  SpO2:      Weight:      Height:        Intake/Output Summary (Last 24 hours) at 02/20/2017 0859 Last data filed at 02/20/2017 0823 Gross per 24 hour  Intake 125 ml  Output -  Net 125 ml   Filed Weights   02/13/17 1334 02/13/17 2100 02/16/17 0715  Weight: 219 lb (99.3 kg) 211 lb 3.2 oz (95.8 kg) 211 lb (95.7 kg)    Telemetry    SR, sinus brady 50s w/ ?non-conducted PACs causing pauses - Personally Reviewed  ECG  n/a  Physical Exam   GEN: WD, WN female, NAD   Neck: JVD not elevated Cardiac: Reg R&R, 2/6 SEM, No rub/gallop.  Respiratory: CTA bilaterally GI: +BS, soft, NT, ND MS: R radial site w/out ecchymosis or hematoma, pulses intact, no edema Neuro:  No focal deficits Psych: a little  anxious  Labs    Chemistry Recent Labs  Lab 02/13/17 1335  02/18/17 0314 02/19/17 0222 02/20/17 0146  NA 137   < > 142 141 138  K 3.3*   < > 3.8 3.6 3.7  CL 102   < > 107 106 105  CO2 25   < > 28 27 26   GLUCOSE 99   < > 127* 112* 93  BUN 10   < > 5* 7 8  CREATININE 1.02*   < > 1.11* 1.04* 0.98  CALCIUM 9.0   < > 8.6* 8.4* 8.4*  PROT 6.5  --   --   --   --   ALBUMIN 3.8  --   --   --   --   AST 71*  --   --   --   --   ALT 16  --   --   --   --   ALKPHOS 72  --   --   --   --   BILITOT 0.6  --   --   --   --   GFRNONAA 56*   < > 50* 54* 58*  GFRAA >60   < >  58* >60 >60  ANIONGAP 10   < > 7 8 7    < > = values in this interval not displayed.     Hematology Recent Labs  Lab 02/18/17 0314 02/19/17 0222 02/20/17 0146  WBC 9.3 7.7 8.3  RBC 4.60 4.35 4.35  HGB 10.2* 9.5* 9.8*  HCT 35.3* 33.7* 33.3*  MCV 76.7* 77.5* 76.6*  MCH 22.2* 21.8* 22.5*  MCHC 28.9* 28.2* 29.4*  RDW 22.2* 22.7* 23.1*  PLT 301 282 291    Cardiac Enzymes Recent Labs  Lab 02/13/17 1335 02/13/17 1759 02/13/17 2135 02/14/17 0841  TROPONINI 9.96* 8.93* 11.36* 10.10*    Radiology    No results found.  Cardiac Studies   CARDIAC CATH: 02/19/2017 Conclusions: 1. Significant multivessel coronary artery disease, including 60% ostial LMCA stenosis with catheter dampening, 70% dominant OM stenosis, and subacute versus chronic occlusion of the mid RCA with faint bridging and left-to-right collaterals. 2. Normal left ventricular contraction with mildly elevated filling pressure, EF 55-65%. Recommendations: 1. Cardiac surgery consultation for CABG. 2. Continue aggressive secondary prevention, including aspirin and statin therapy. We will defer adding heparin at this time, given that infarct occurred more than 48 hours ago, the patient is asymptomatic, and she presented with severe iron deficiency anemia. Diagnostic Diagram       ECHO: 02/14/2017 Study Conclusions - Left ventricle: The  cavity size was normal. Wall thickness was   increased in a pattern of mild LVH. Systolic function was normal.   The estimated ejection fraction was in the range of 60% to 65%.   Wall motion was normal; there were no regional wall motion   abnormalities. Features are consistent with a pseudonormal left   ventricular filling pattern, with concomitant abnormal relaxation   and increased filling pressure (grade 2 diastolic dysfunction). - Aortic valve: Valve area (VTI): 1.92 cm^2. Valve area (Vmax):   1.91 cm^2. Valve area (Vmean): 1.55 cm^2. - Mitral valve: There was mild regurgitation.   Patient Profile     66 y.o. female admitted 12/04 with elevated troponin, severe iron def anemia. Hx moderate CAD, on Xarelto (for prior history of DVT/PE), recent vomiting, diarrhea. EGD and colon negative, on PPI, s/p transfusion, cath w/ 3 v dz, needs CABG  Assessment & Plan    1. CAD/Elevated troponin - See cath report above - 3v dz w/ preserved EF - TCTS has seen, CABG recommended, timing TBD - continue ASA, high-dose statin, Imdur 30 - sinus brady at baseline at times>>no BB - BP elevated, MD advise on adding ACE/ARB  2. Anemia - H&H 7.2/28.0 on admit, s/p 3 u PRBCs - Iron 18, ferritin 7 - FOBT neg x 2, GI has seen, no source of bleeding found on EGD, colon or CT  3. History of PE/DVT - pta on Xarelto - held on admit - now on subcu heparin  For questions or updates, please contact [place] Please consult [email or web address] for contact info under Cardiology/STEMI.      Signed, Rosaria Ferries, PA-C  02/20/2017, 8:59 AM    History and all data above reviewed.  Patient examined.  I agree with the findings as above.  The patient exam reveals COR:RRR  ,  Lungs: Clear  ,  Abd: Positive bowel sounds, no rebound no guarding, Ext No edema, right wrist access site without bruising or bleeding.  .  All available labs, radiology testing, previous records reviewed. Agree with documented  assessment and plan. CAD:  CABG Thursday.  HTN:  Will titrate meds  as needed after surgery.  DVT/PE:  She had an event greater than 10 years ago.  I have no idea whether it was provoked or unprovoked.  However, there was a note by Dr. Bronson Ing that he was intentionally continuing this.  Assuming this was unprovoked and the patient's preference to remain on lifelong anticoagulation this should be continued at discharge although we could use lower dose per current data.    Minus Breeding  9:41 AM  02/20/2017

## 2017-02-20 NOTE — Care Management Note (Signed)
Case Management Note Marvetta Gibbons RN, BSN Unit 4E-Case Manager [phone]  Patient Details  Name: Jennifer [name] MRN: [id number] Date of Birth: 1950/06/12  Subjective/Objective:  Pt admitted with N/V-chest pain- s/p cath with MVD and left main disease- CVTS consulted- plan for CABG 12/13                  Action/Plan: PTA pt lived at home- independent- PCP- [employer],  CM will follow for transition needs post CABG  Expected Discharge Date:                 Expected Discharge Plan:  Home/Self Care  In-House Referral:     Discharge planning Services  CM Consult  Post Acute Care Choice:    Choice offered to:     DME Arranged:    DME Agency:     HH Arranged:    HH Agency:     Status of Service:  In process, will continue to follow  If discussed at Long Length of Stay Meetings, dates discussed:    Discharge Disposition:   Additional Comments:  Dawayne Patricia, RN 02/20/2017, 3:49 PM

## 2017-02-20 NOTE — Progress Notes (Signed)
CARDIAC REHAB PHASE I   PRE:  Rate/Rhythm: 63 SR    BP: sitting 152/84    SaO2: 97 RA  MODE:  Ambulation: 470 ft   POST:  Rate/Rhythm: 72 SR    BP: sitting 162/90     SaO2: 98 RA  Pt has been walking to nursing desk. Wanted to walk farther this am so walked to end of hall however on return trip began having shoulder aching/heaviness. Rested then returned to room. Aching resolved after 4-5 min. In recliner. BP elevated. Discussed sternal precautions, IS (inspiring 1750 mL now), mobility, and d/c planning. She heats with wood at her house so she will talk with her brother about staying there at d/c. Does not think it will be a problem. Very receptive. We also discussed smoking cessation and remaining quit. She quit 1 month ago using candy, esp in the car. I gave her a fake cigarette, which she enjoyed, and sts it will be especially helpful in the car (where she smoked the most). Also gave her reading materials and videos on OHS and smoking cessation. Instructed pt to not walk farther than the nursing station. Will f/u as time permits. [id number]   [place], ACSM 02/20/2017 10:30 AM

## 2017-02-20 NOTE — Progress Notes (Signed)
[facility]                                                                              Patient Demographics  Jennifer [name], is a 66 y.o. female, DOB - Nov 05, 1950, VPX:[id number]  Admit date - 02/13/2017   Admitting Physician Karmen Bongo, MD  Outpatient Primary MD for the patient is Redmond School, MD  Outpatient specialists:   LOS - 7  days   Medical records reviewed and are as summarized below:    Chief Complaint  Patient presents with  . Emesis       Brief summary   Per admit note by Dr. Lorin Mercy on 12/4 Patient is a 66 year old female with hypertension, hyperlipidemia, DVT, nonobstructive CAD presented with nausea, vomiting, diarrhea and chest pain.  She reported lot of weak spells, nausea vomiting diarrhea in the last 4 days.  She reported the brown looking emesis but no frank hematemesis, hematochezia or melena.  Patient also reported dyspnea on exertion.  Troponin 9.96 on admission.   Assessment & Plan    Principal Problem:   NSTEMI (non-ST elevated myocardial infarction) ([place]) - troponin peaked at 11.3, now trending down -Cardiology was consulted, recommended to continue packed RBC transfusion, hold systemic anticoagulation -CT angiogram ruled out aortic dissection -2D echo showed EF of 60-65%, no regional wall motion abnormal dysfunction -Patient underwent cardiac catheterization on 12/10 which showed significant multivessel CAD, recommended CTVS consultation for CABG.   - Continue aggressive secondary prevention with aspirin and statin therapy. CABG timing TBD  Active Problems:   Symptomatic anemia -Hemoglobin 7.0 on admission, transfused 3 units packed RBCs, subsequently received 2 more packed RBC transfusion, 1 unit Feraheme on 12/7. -FOBT negative.  Anemia panel showed iron deficiency.  GI was consulted, placed on IV PPI, underwent EGD and colonoscopy which was reassuring with no lesions. -H&H currently stable    Essential  hypertension -BP still not well controlled, continue imdur, added Norvasc  -Continue hydralazine with parameters     Hyperlipidemia -Continue Lipitor 40 mg at bedtime    Tobacco use disorder -Patient counseled on tobacco cessation, continue nicotine patch  History of PE/DVT -Xarelto on hold, plan for CABG   Code Status: Full CODE STATUS DVT Prophylaxis:   SCD's Family Communication: Discussed in detail with the patient, all imaging results, lab results explained to the patient   Disposition Plan: Will need CABG  Time Spent in minutes 25 minutes  Procedures:  EGD: Normal esophagus, stomach, duodenum Colonoscopy: 1 3 mm polyp in the ascending colon, resected, one 9 mm polyp in the sigmoid colon, diverticulosis in the left colon otherwise normal  Cath 12/10 Conclusions: 1. Significant multivessel coronary artery disease, including 60% ostial LMCA stenosis with catheter dampening, 70% dominant OM stenosis, and subacute versus chronic occlusion of the mid RCA with faint bridging and left-to-right collaterals. 2. Normal left ventricular contraction with mildly elevated filling pressure.  Consultants:   Cardiology GI  Antimicrobials:      Medications  Scheduled Meds: . allopurinol  300 mg Oral Daily  . aspirin EC  81 mg Oral Daily  . atorvastatin  40 mg Oral Daily  .  DULoxetine  30 mg Oral Daily  . gabapentin  300 mg Oral BID WC  . gabapentin  600 mg Oral QHS  . heparin  5,000 Units Subcutaneous Q8H  . isosorbide mononitrate  30 mg Oral Daily  . pantoprazole  40 mg Oral Q0600  . sodium chloride flush  3 mL Intravenous Q12H  . sodium chloride flush  3 mL Intravenous Q12H   Continuous Infusions: . sodium chloride    . sodium chloride 250 mL (02/16/17 1235)  . sodium chloride     PRN Meds:.sodium chloride, sodium chloride, acetaminophen, hydrALAZINE, nitroGLYCERIN, ondansetron (ZOFRAN) IV, oxyCODONE, sodium chloride flush, sodium chloride flush   Antibiotics    Anti-infectives (From admission, onward)   None        Subjective:   Jennifer [name] was seen and examined today.  Denies any specific complaints, no chest pain, shortness of breath no fevers or chills.  Somewhat anxious about the CABG surgery.   Patient denies abdominal pain, N/V/D/C, focal weakness, numbess, tingling.      Objective:   Vitals:   02/19/17 2100 02/20/17 0015 02/20/17 0418 02/20/17 0818  BP: (!) 160/54 (!) 151/69 (!) 156/77 (!) 143/51  Pulse:  65 73 75  Resp:  18 18 16   Temp:  98.5 F (36.9 C) 98.2 F (36.8 C) 98.1 F (36.7 C)  TempSrc:  Oral Oral Oral  SpO2:      Weight:      Height:        Intake/Output Summary (Last 24 hours) at 02/20/2017 1103 Last data filed at 02/20/2017 0823 Gross per 24 hour  Intake 125 ml  Output -  Net 125 ml     Wt Readings from Last 3 Encounters:  02/16/17 95.7 kg (211 lb)  02/12/17 99.8 kg (220 lb)  01/23/17 99.3 kg (219 lb)     Exam   General: Alert and oriented x 3, NAD  Eyes:   HEENT:  Atraumatic, normocephalic  Cardiovascular: S1 S2 auscultated, 2/6 SEM. No pedal edema b/l  Respiratory: Clear to auscultation bilaterally, no wheezing, rales or rhonchi  Gastrointestinal: Soft, nontender, nondistended, + bowel sounds  Ext: no pedal edema bilaterally  Neuro: no new deficit  Musculoskeletal: No digital cyanosis, clubbing  Skin: No rashes  Psych: Normal affect and demeanor, alert and oriented x3    Data Reviewed:  I have personally reviewed following labs and imaging studies  Micro Results Recent Results (from the past 240 hour(s))  MRSA PCR Screening     Status: None   Collection Time: 02/13/17 10:16 PM  Result Value Ref Range Status   MRSA by PCR NEGATIVE NEGATIVE Final    Comment:        The GeneXpert MRSA Assay (FDA approved for NASAL specimens only), is one component of a comprehensive MRSA colonization surveillance program. It is not intended to diagnose MRSA infection nor to  guide or monitor treatment for MRSA infections.     Radiology Reports Dg Chest 2 View  Result Date: 02/13/2017 CLINICAL DATA:  emesis x4 days with increased reflux. --- diarrhea that started x2 days ago as well. PT states generalized malaise and poor appetite with body aches for weeks. EXAM: CHEST - 2 VIEW COMPARISON:  CT 01/06/2015 FINDINGS: Lungs are clear. Heart size upper limits normal.  Atheromatous aorta. No effusion.  No pneumothorax. Visualized bones unremarkable. IMPRESSION: No acute cardiopulmonary disease. Aortic Atherosclerosis (ICD10-170.0) Electronically Signed   By: Lucrezia Europe M.D.   On: 02/13/2017 15:52   Ct  Abdomen Pelvis W Contrast  Result Date: 02/13/2017 CLINICAL DATA:  Vomiting for several days with decreased appetite EXAM: CT ABDOMEN AND PELVIS WITH CONTRAST TECHNIQUE: Multidetector CT imaging of the abdomen and pelvis was performed using the standard protocol following bolus administration of intravenous contrast. CONTRAST:  121mL ISOVUE-300 IOPAMIDOL (ISOVUE-300) INJECTION 61% COMPARISON:  04/13/2016 FINDINGS: Lower chest: No acute abnormality. Hepatobiliary: No focal liver abnormality is seen. No gallstones, gallbladder wall thickening, or biliary dilatation. Pancreas: Unremarkable. No pancreatic ductal dilatation or surrounding inflammatory changes. Spleen: Normal in size without focal abnormality. Adrenals/Urinary Tract: The adrenal glands are within normal limits. Solitary left kidney is noted. No renal calculi or obstructive changes are noted. A simple cyst is noted in the upper pole stable from the prior exam. No obstructive changes are noted. The right kidney is severely shrunken and calcified stable from the prior exam. The bladder is well distended. Stomach/Bowel: Scattered diverticular change of the colon is noted. The colon is predominately decompressed. No inflammatory or obstructive changes are seen. The appendix is within normal limits. No obstructive or  inflammatory changes in small bowel are seen. Vascular/Lymphatic: Aortic atherosclerosis. No enlarged abdominal or pelvic lymph nodes. Reproductive: Status post hysterectomy. No adnexal masses. Other: No abdominal wall hernia or abnormality. No abdominopelvic ascites. Musculoskeletal: Mild degenerative changes of lumbar spine are noted. No acute abnormality seen. IMPRESSION: Chronic changes as described above stable from the prior exam. No acute abnormality to correspond with the patient's clinical symptomatology is noted. Electronically Signed   By: Inez Catalina M.D.   On: 02/13/2017 15:47   Ct Angio Chest Aorta W/cm &/or Wo/cm  Result Date: 02/13/2017 CLINICAL DATA:  Mid chest pressure today, complex chest pain, elevated troponins, question aortic dissection EXAM: CT ANGIOGRAPHY CHEST WITH CONTRAST TECHNIQUE: Multidetector CT imaging of the chest was performed using the standard protocol during bolus administration of intravenous contrast. Multiplanar CT image reconstructions and MIPs were obtained to evaluate the vascular anatomy. CONTRAST:  165mL ISOVUE-370 IOPAMIDOL (ISOVUE-370) INJECTION 76% IV COMPARISON:  01/06/2015 FINDINGS: Cardiovascular: Extensive atherosclerotic calcifications aorta, proximal great vessels and coronary arteries. Aorta normal caliber 3.5 cm transverse image 27. No intramural hematoma on precontrast imaging. No para-aortic hemorrhage. Following contrast, normal aortic enhancement is seen without aneurysm or dissection. Heart unremarkable. No pericardial effusion. Pulmonary arteries patent without evidence of pulmonary embolism. Mediastinum/Nodes: Esophagus unremarkable. No thoracic adenopathy. Base of cervical region normal appearance. Lungs/Pleura: Central peribronchial thickening. Lungs clear. No pulmonary infiltrate, pleural effusion or pneumothorax. Upper Abdomen: Tiny cyst upper pole LEFT kidney. Small splenules adjacent to spleen. Adrenal thickening without nodularity. Remaining  visualized portions of upper abdomen unremarkable. Musculoskeletal: No acute abnormalities. Endplate spur formation lower thoracic spine. Review of the MIP images confirms the above findings. IMPRESSION: No evidence of aortic aneurysm or dissection. Central bronchitic changes. No acute intrathoracic abnormalities otherwise identified. Extensive atherosclerotic calcifications including in coronary arteries. Aortic Atherosclerosis (ICD10-I70.0). Electronically Signed   By: Lavonia Dana M.D.   On: 02/13/2017 19:28    Lab Data:  CBC: Recent Labs  Lab 02/13/17 1335  02/16/17 0156 02/17/17 0208 02/18/17 0314 02/19/17 0222 02/20/17 0146  WBC 11.2*   < > 7.7 8.9 9.3 7.7 8.3  NEUTROABS 8.4*  --   --   --   --   --   --   HGB 7.0*   < > 8.0* 10.5* 10.2* 9.5* 9.8*  HCT 27.1*   < > 28.2* 35.6* 35.3* 33.7* 33.3*  MCV 69.8*   < > 74.0* 75.4*  76.7* 77.5* 76.6*  PLT 374   < > 242 276 301 282 291   < > = values in this interval not displayed.   Basic Metabolic Panel: Recent Labs  Lab 02/16/17 0156 02/17/17 0208 02/18/17 0314 02/19/17 0222 02/20/17 0146  NA 139 140 142 141 138  K 3.9 4.1 3.8 3.6 3.7  CL 106 106 107 106 105  CO2 26 27 28 27 26   GLUCOSE 90 98 127* 112* 93  BUN 6 6 5* 7 8  CREATININE 1.16* 1.03* 1.11* 1.04* 0.98  CALCIUM 8.3* 8.5* 8.6* 8.4* 8.4*   GFR: Estimated Creatinine Clearance: 63.8 mL/min (by C-G formula based on SCr of 0.98 mg/dL). Liver Function Tests: Recent Labs  Lab 02/13/17 1335  AST 71*  ALT 16  ALKPHOS 72  BILITOT 0.6  PROT 6.5  ALBUMIN 3.8   Recent Labs  Lab 02/13/17 1335  LIPASE 21   No results for input(s): AMMONIA in the last 168 hours. Coagulation Profile: Recent Labs  Lab 02/14/17 0841  INR 1.11   Cardiac Enzymes: Recent Labs  Lab 02/13/17 1335 02/13/17 1759 02/13/17 2135 02/14/17 0841  TROPONINI 9.96* 8.93* 11.36* 10.10*   BNP (last 3 results) No results for input(s): PROBNP in the last 8760 hours. HbA1C: No results for  input(s): HGBA1C in the last 72 hours. CBG: No results for input(s): GLUCAP in the last 168 hours. Lipid Profile: No results for input(s): CHOL, HDL, LDLCALC, TRIG, CHOLHDL, LDLDIRECT in the last 72 hours. Thyroid Function Tests: No results for input(s): TSH, T4TOTAL, FREET4, T3FREE, THYROIDAB in the last 72 hours. Anemia Panel: No results for input(s): VITAMINB12, FOLATE, FERRITIN, TIBC, IRON, RETICCTPCT in the last 72 hours. Urine analysis:    Component Value Date/Time   COLORURINE YELLOW 10/26/2016 1139   APPEARANCEUR CLEAR 10/26/2016 1139   LABSPEC 1.013 10/26/2016 1139   PHURINE 5.0 10/26/2016 1139   GLUCOSEU NEGATIVE 10/26/2016 1139   HGBUR NEGATIVE 10/26/2016 [place] 10/26/2016 1139   KETONESUR NEGATIVE 10/26/2016 1139   PROTEINUR NEGATIVE 10/26/2016 1139   UROBILINOGEN 0.2 06/24/2010 1814   NITRITE NEGATIVE 10/26/2016 1139   LEUKOCYTESUR NEGATIVE 10/26/2016 1139     Tylia Ewell M.D. [facility] 02/20/2017, 11:03 AM  Pager: [phone] Between 7am to 7pm - call Pager - [phone]  After 7pm go to [email or web address] - password [email or web address]  Call night coverage person covering after 7pm

## 2017-02-21 ENCOUNTER — Encounter (HOSPITAL_COMMUNITY)
Admission: EM | Disposition: A | Discharge status: Home / Self Care | Payer: Self-pay | Source: Home / Self Care | Attending: Internal Medicine

## 2017-02-21 ENCOUNTER — Inpatient Hospital Stay (HOSPITAL_COMMUNITY): Payer: Medicare Other | Admitting: Certified Registered Nurse Anesthetist

## 2017-02-21 ENCOUNTER — Other Ambulatory Visit: Payer: Self-pay

## 2017-02-21 ENCOUNTER — Inpatient Hospital Stay (HOSPITAL_COMMUNITY): Payer: Medicare Other

## 2017-02-21 DIAGNOSIS — I25110 Atherosclerotic heart disease of native coronary artery with unstable angina pectoris: Secondary | ICD-10-CM

## 2017-02-21 DIAGNOSIS — I214 Non-ST elevation (NSTEMI) myocardial infarction: Secondary | ICD-10-CM

## 2017-02-21 DIAGNOSIS — Z951 Presence of aortocoronary bypass graft: Secondary | ICD-10-CM

## 2017-02-21 HISTORY — PX: TEE WITHOUT CARDIOVERSION: SHX5443

## 2017-02-21 HISTORY — PX: CORONARY ARTERY BYPASS GRAFT: SHX141

## 2017-02-21 LAB — POCT I-STAT 3, ART BLOOD GAS (G3+)
Acid-Base Excess: 6 mmol/L — ABNORMAL HIGH (ref 0.0–2.0)
Acid-Base Excess: 2 mmol/L (ref 0.0–2.0)
Acid-Base Excess: 1 mmol/L (ref 0.0–2.0)
Acid-base deficit: 1 mmol/L (ref 0.0–2.0)
Acid-base deficit: 3 mmol/L — ABNORMAL HIGH (ref 0.0–2.0)
Bicarbonate: 31.2 mmol/L — ABNORMAL HIGH (ref 20.0–28.0)
Bicarbonate: 27.1 mmol/L (ref 20.0–28.0)
Bicarbonate: 24.9 mmol/L (ref 20.0–28.0)
Bicarbonate: 27.1 mmol/L (ref 20.0–28.0)
Bicarbonate: 23.1 mmol/L (ref 20.0–28.0)
O2 Saturation: 100 %
O2 Saturation: 98 %
O2 Saturation: 93 %
O2 Saturation: 97 %
O2 Saturation: 95 %
Patient temperature: 36.1
Patient temperature: 36.8
Patient temperature: 37.1
TCO2: 33 mmol/L — ABNORMAL HIGH (ref 22–32)
TCO2: 28 mmol/L (ref 22–32)
TCO2: 26 mmol/L (ref 22–32)
TCO2: 28 mmol/L (ref 22–32)
TCO2: 24 mmol/L (ref 22–32)
pCO2 arterial: 49.9 mmHg — ABNORMAL HIGH (ref 32.0–48.0)
pCO2 arterial: 45.3 mmHg (ref 32.0–48.0)
pCO2 arterial: 43.3 mmHg (ref 32.0–48.0)
pCO2 arterial: 45.8 mmHg (ref 32.0–48.0)
pCO2 arterial: 43.4 mmHg (ref 32.0–48.0)
pH, Arterial: 7.405 (ref 7.350–7.450)
pH, Arterial: 7.385 (ref 7.350–7.450)
pH, Arterial: 7.364 (ref 7.350–7.450)
pH, Arterial: 7.379 (ref 7.350–7.450)
pH, Arterial: 7.334 — ABNORMAL LOW (ref 7.350–7.450)
pO2, Arterial: 455 mmHg — ABNORMAL HIGH (ref 83.0–108.0)
pO2, Arterial: 101 mmHg (ref 83.0–108.0)
pO2, Arterial: 65 mmHg — ABNORMAL LOW (ref 83.0–108.0)
pO2, Arterial: 94 mmHg (ref 83.0–108.0)
pO2, Arterial: 82 mmHg — ABNORMAL LOW (ref 83.0–108.0)

## 2017-02-21 LAB — CBC
HCT: 32.9 % — ABNORMAL LOW (ref 36.0–46.0)
HCT: 36.2 % (ref 36.0–46.0)
Hemoglobin: 9.5 g/dL — ABNORMAL LOW (ref 12.0–15.0)
Hemoglobin: 10.6 g/dL — ABNORMAL LOW (ref 12.0–15.0)
MCH: 22.4 pg — ABNORMAL LOW (ref 26.0–34.0)
MCH: 22.8 pg — ABNORMAL LOW (ref 26.0–34.0)
MCHC: 28.9 g/dL — ABNORMAL LOW (ref 30.0–36.0)
MCHC: 29.3 g/dL — ABNORMAL LOW (ref 30.0–36.0)
MCV: 77.4 fL — ABNORMAL LOW (ref 78.0–100.0)
MCV: 77.8 fL — ABNORMAL LOW (ref 78.0–100.0)
Platelets: 217 10*3/uL (ref 150–400)
Platelets: 281 10*3/uL (ref 150–400)
RBC: 4.25 MIL/uL (ref 3.87–5.11)
RBC: 4.65 MIL/uL (ref 3.87–5.11)
RDW: 23.9 % — ABNORMAL HIGH (ref 11.5–15.5)
RDW: 24.3 % — ABNORMAL HIGH (ref 11.5–15.5)
WBC: 13.7 10*3/uL — ABNORMAL HIGH (ref 4.0–10.5)
WBC: 18.7 10*3/uL — ABNORMAL HIGH (ref 4.0–10.5)

## 2017-02-21 LAB — PREPARE RBC (CROSSMATCH)

## 2017-02-21 LAB — POCT I-STAT, CHEM 8
BUN: 7 mg/dL (ref 6–20)
BUN: 5 mg/dL — ABNORMAL LOW (ref 6–20)
BUN: 6 mg/dL (ref 6–20)
BUN: 6 mg/dL (ref 6–20)
BUN: 7 mg/dL (ref 6–20)
BUN: 9 mg/dL (ref 6–20)
Calcium, Ion: 1.23 mmol/L (ref 1.15–1.40)
Calcium, Ion: 1.07 mmol/L — ABNORMAL LOW (ref 1.15–1.40)
Calcium, Ion: 1.22 mmol/L (ref 1.15–1.40)
Calcium, Ion: 1.11 mmol/L — ABNORMAL LOW (ref 1.15–1.40)
Calcium, Ion: 1.14 mmol/L — ABNORMAL LOW (ref 1.15–1.40)
Calcium, Ion: 1.16 mmol/L (ref 1.15–1.40)
Chloride: 102 mmol/L (ref 101–111)
Chloride: 100 mmol/L — ABNORMAL LOW (ref 101–111)
Chloride: 102 mmol/L (ref 101–111)
Chloride: 103 mmol/L (ref 101–111)
Chloride: 103 mmol/L (ref 101–111)
Chloride: 103 mmol/L (ref 101–111)
Creatinine, Ser: 0.8 mg/dL (ref 0.44–1.00)
Creatinine, Ser: 0.7 mg/dL (ref 0.44–1.00)
Creatinine, Ser: 0.8 mg/dL (ref 0.44–1.00)
Creatinine, Ser: 0.7 mg/dL (ref 0.44–1.00)
Creatinine, Ser: 0.7 mg/dL (ref 0.44–1.00)
Creatinine, Ser: 0.7 mg/dL (ref 0.44–1.00)
Glucose, Bld: 91 mg/dL (ref 65–99)
Glucose, Bld: 102 mg/dL — ABNORMAL HIGH (ref 65–99)
Glucose, Bld: 114 mg/dL — ABNORMAL HIGH (ref 65–99)
Glucose, Bld: 136 mg/dL — ABNORMAL HIGH (ref 65–99)
Glucose, Bld: 127 mg/dL — ABNORMAL HIGH (ref 65–99)
Glucose, Bld: 112 mg/dL — ABNORMAL HIGH (ref 65–99)
HCT: 30 % — ABNORMAL LOW (ref 36.0–46.0)
HCT: 26 % — ABNORMAL LOW (ref 36.0–46.0)
HCT: 29 % — ABNORMAL LOW (ref 36.0–46.0)
HCT: 26 % — ABNORMAL LOW (ref 36.0–46.0)
HCT: 27 % — ABNORMAL LOW (ref 36.0–46.0)
HCT: 31 % — ABNORMAL LOW (ref 36.0–46.0)
Hemoglobin: 10.2 g/dL — ABNORMAL LOW (ref 12.0–15.0)
Hemoglobin: 8.8 g/dL — ABNORMAL LOW (ref 12.0–15.0)
Hemoglobin: 9.9 g/dL — ABNORMAL LOW (ref 12.0–15.0)
Hemoglobin: 8.8 g/dL — ABNORMAL LOW (ref 12.0–15.0)
Hemoglobin: 9.2 g/dL — ABNORMAL LOW (ref 12.0–15.0)
Hemoglobin: 10.5 g/dL — ABNORMAL LOW (ref 12.0–15.0)
Potassium: 4.3 mmol/L (ref 3.5–5.1)
Potassium: 4.2 mmol/L (ref 3.5–5.1)
Potassium: 4.2 mmol/L (ref 3.5–5.1)
Potassium: 5.1 mmol/L (ref 3.5–5.1)
Potassium: 4.6 mmol/L (ref 3.5–5.1)
Potassium: 4.8 mmol/L (ref 3.5–5.1)
Sodium: 142 mmol/L (ref 135–145)
Sodium: 142 mmol/L (ref 135–145)
Sodium: 143 mmol/L (ref 135–145)
Sodium: 141 mmol/L (ref 135–145)
Sodium: 140 mmol/L (ref 135–145)
Sodium: 139 mmol/L (ref 135–145)
TCO2: 29 mmol/L (ref 22–32)
TCO2: 29 mmol/L (ref 22–32)
TCO2: 32 mmol/L (ref 22–32)
TCO2: 29 mmol/L (ref 22–32)
TCO2: 27 mmol/L (ref 22–32)
TCO2: 26 mmol/L (ref 22–32)

## 2017-02-21 LAB — MAGNESIUM: Magnesium: 3 mg/dL — ABNORMAL HIGH (ref 1.7–2.4)

## 2017-02-21 LAB — GLUCOSE, CAPILLARY
Glucose-Capillary: 125 mg/dL — ABNORMAL HIGH (ref 65–99)
Glucose-Capillary: 115 mg/dL — ABNORMAL HIGH (ref 65–99)
Glucose-Capillary: 119 mg/dL — ABNORMAL HIGH (ref 65–99)
Glucose-Capillary: 128 mg/dL — ABNORMAL HIGH (ref 65–99)

## 2017-02-21 LAB — CREATININE, SERUM
Creatinine, Ser: 0.87 mg/dL (ref 0.44–1.00)
GFR calc Af Amer: >60 mL/min (ref >60)
GFR calc non Af Amer: >60 mL/min (ref >60)

## 2017-02-21 LAB — PLATELET COUNT: Platelets: 278 10*3/uL (ref 150–400)

## 2017-02-21 LAB — APTT: aPTT: 27 seconds (ref 24–36)

## 2017-02-21 LAB — PROTIME-INR
INR: 1.2
Prothrombin Time: 15.1 seconds (ref 11.4–15.2)

## 2017-02-21 LAB — HEMOGLOBIN AND HEMATOCRIT, BLOOD
HCT: 27.1 % — ABNORMAL LOW (ref 36.0–46.0)
Hemoglobin: 8 g/dL — ABNORMAL LOW (ref 12.0–15.0)

## 2017-02-21 SURGERY — CORONARY ARTERY BYPASS GRAFTING (CABG)
Anesthesia: General | Site: Chest

## 2017-02-21 MED ORDER — MORPHINE SULFATE (PF) 2 MG/ML IV SOLN
1.0000 mg | INTRAVENOUS | Status: DC | PRN
Start: 2017-02-21 — End: 2017-02-21

## 2017-02-21 MED ORDER — ORAL CARE MOUTH RINSE: 15.0000 mL | Freq: Two times a day (BID) | OROMUCOSAL | Status: DC

## 2017-02-21 MED ORDER — METOPROLOL TARTRATE 5 MG/5ML IV SOLN: 2.5000 mg | INTRAVENOUS | Status: DC | PRN

## 2017-02-21 MED ORDER — NITROGLYCERIN IN D5W 200-5 MCG/ML-% IV SOLN: 0.0000 ug/min | INTRAVENOUS | Status: DC

## 2017-02-21 MED ORDER — LACTATED RINGERS IV SOLN
INTRAVENOUS | Status: DC | PRN
  Administered 2017-02-21: 08:00:00 via INTRAVENOUS

## 2017-02-21 MED ORDER — PHENYLEPHRINE HCL 10 MG/ML IJ SOLN
0.0000 ug/min | INTRAMUSCULAR | Status: DC
  Administered 2017-02-22: 35 ug/min via INTRAVENOUS
  Filled 2017-02-21: qty 2

## 2017-02-21 MED ORDER — FENTANYL CITRATE (PF) 250 MCG/5ML IJ SOLN
INTRAMUSCULAR | Status: AC
  Filled 2017-02-21: qty 5

## 2017-02-21 MED ORDER — SODIUM CHLORIDE 0.9% FLUSH
3.0000 mL | Freq: Two times a day (BID) | INTRAVENOUS | Status: DC
  Administered 2017-02-22 – 2017-02-26 (×10): 3 mL via INTRAVENOUS

## 2017-02-21 MED ORDER — HEMOSTATIC AGENTS (NO CHARGE) OPTIME
TOPICAL | Status: DC | PRN
  Administered 2017-02-21: 3 via TOPICAL

## 2017-02-21 MED ORDER — EPHEDRINE 5 MG/ML INJ
INTRAVENOUS | Status: AC
  Filled 2017-02-21: qty 10

## 2017-02-21 MED ORDER — CEFUROXIME SODIUM 1.5 G IV SOLR
1.5000 g | Freq: Two times a day (BID) | INTRAVENOUS | Status: AC
  Administered 2017-02-21 – 2017-02-23 (×4): 1.5 g via INTRAVENOUS
  Filled 2017-02-21 (×4): qty 1.5

## 2017-02-21 MED ORDER — SODIUM CHLORIDE 0.45 % IV SOLN
INTRAVENOUS | Status: DC | PRN
  Administered 2017-02-21: 15:00:00 via INTRAVENOUS

## 2017-02-21 MED ORDER — ALBUMIN HUMAN 5 % IV SOLN: 250.0000 mL | INTRAVENOUS | Status: AC | PRN

## 2017-02-21 MED ORDER — ONDANSETRON HCL 4 MG/2ML IJ SOLN: 4.0000 mg | Freq: Four times a day (QID) | INTRAMUSCULAR | Status: DC | PRN

## 2017-02-21 MED ORDER — PANTOPRAZOLE SODIUM 40 MG PO TBEC
40.0000 mg | DELAYED_RELEASE_TABLET | Freq: Every day | ORAL | Status: DC
  Administered 2017-02-22 – 2017-02-26 (×5): 40 mg via ORAL
  Filled 2017-02-21 (×6): qty 1

## 2017-02-21 MED ORDER — CHLORHEXIDINE GLUCONATE 0.12 % MT SOLN
15.0000 mL | Freq: Two times a day (BID) | OROMUCOSAL | Status: DC
  Administered 2017-02-22: 15 mL via OROMUCOSAL
  Filled 2017-02-21: qty 15

## 2017-02-21 MED ORDER — EPINEPHRINE PF 1 MG/10ML IJ SOSY
PREFILLED_SYRINGE | INTRAMUSCULAR | Status: AC
  Filled 2017-02-21: qty 10

## 2017-02-21 MED ORDER — INSULIN ASPART 100 UNIT/ML ~~LOC~~ SOLN
0.0000 [IU] | SUBCUTANEOUS | Status: DC
  Administered 2017-02-22 (×2): 2 [IU] via SUBCUTANEOUS

## 2017-02-21 MED ORDER — LACTATED RINGERS IV SOLN
INTRAVENOUS | Status: DC | PRN
  Administered 2017-02-21 (×2): via INTRAVENOUS

## 2017-02-21 MED ORDER — CHLORHEXIDINE GLUCONATE 0.12% ORAL RINSE (MEDLINE KIT): 15.0000 mL | Freq: Two times a day (BID) | OROMUCOSAL | Status: DC

## 2017-02-21 MED ORDER — SODIUM CHLORIDE 0.9 % IV SOLN: INTRAVENOUS | Status: DC

## 2017-02-21 MED ORDER — ACETAMINOPHEN 160 MG/5ML PO SOLN: 650.0000 mg | Freq: Once | ORAL | Status: AC

## 2017-02-21 MED ORDER — MORPHINE SULFATE (PF) 4 MG/ML IV SOLN
1.0000 mg | INTRAVENOUS | Status: AC | PRN
  Administered 2017-02-21: 1 mg via INTRAVENOUS
  Administered 2017-02-21: 4 mg via INTRAVENOUS
  Administered 2017-02-21: 1 mg via INTRAVENOUS
  Filled 2017-02-21 (×2): qty 1

## 2017-02-21 MED ORDER — MIDAZOLAM HCL 2 MG/2ML IJ SOLN: 2.0000 mg | INTRAMUSCULAR | Status: DC | PRN

## 2017-02-21 MED ORDER — OXYCODONE HCL 5 MG PO TABS
5.0000 mg | ORAL_TABLET | ORAL | Status: DC | PRN
  Administered 2017-02-21 – 2017-02-23 (×9): 10 mg via ORAL
  Filled 2017-02-21 (×9): qty 2

## 2017-02-21 MED ORDER — CALCIUM CHLORIDE 10 % IV SOLN
INTRAVENOUS | Status: AC
  Filled 2017-02-21: qty 10

## 2017-02-21 MED ORDER — ASPIRIN 81 MG PO CHEW: 324.0000 mg | CHEWABLE_TABLET | Freq: Every day | ORAL | Status: DC

## 2017-02-21 MED ORDER — PROPOFOL 10 MG/ML IV BOLUS
INTRAVENOUS | Status: DC | PRN
Start: 2017-02-21 — End: 2017-02-21
  Administered 2017-02-21: 40 mg via INTRAVENOUS

## 2017-02-21 MED ORDER — LIDOCAINE 2% (20 MG/ML) 5 ML SYRINGE
INTRAMUSCULAR | Status: DC | PRN
  Administered 2017-02-21: 100 mg via INTRAVENOUS

## 2017-02-21 MED ORDER — DOCUSATE SODIUM 100 MG PO CAPS
200.0000 mg | ORAL_CAPSULE | Freq: Every day | ORAL | Status: DC
  Administered 2017-02-22 – 2017-02-27 (×6): 200 mg via ORAL
  Filled 2017-02-21 (×6): qty 2

## 2017-02-21 MED ORDER — MIDAZOLAM HCL 10 MG/2ML IJ SOLN
INTRAMUSCULAR | Status: AC
  Filled 2017-02-21: qty 2

## 2017-02-21 MED ORDER — VANCOMYCIN HCL IN DEXTROSE 1-5 GM/200ML-% IV SOLN
1000.0000 mg | Freq: Once | INTRAVENOUS | Status: AC
  Administered 2017-02-21: 1000 mg via INTRAVENOUS
  Filled 2017-02-21: qty 200

## 2017-02-21 MED ORDER — FENTANYL CITRATE (PF) 250 MCG/5ML IJ SOLN
INTRAMUSCULAR | Status: DC | PRN
  Administered 2017-02-21: 50 ug via INTRAVENOUS
  Administered 2017-02-21: 250 ug via INTRAVENOUS
  Administered 2017-02-21: 1000 ug via INTRAVENOUS
  Administered 2017-02-21 (×2): 50 ug via INTRAVENOUS
  Administered 2017-02-21: 100 ug via INTRAVENOUS

## 2017-02-21 MED ORDER — BISACODYL 5 MG PO TBEC
10.0000 mg | DELAYED_RELEASE_TABLET | Freq: Every day | ORAL | Status: DC
  Administered 2017-02-22 – 2017-02-27 (×6): 10 mg via ORAL
  Filled 2017-02-21 (×6): qty 2

## 2017-02-21 MED ORDER — ACETAMINOPHEN 160 MG/5ML PO SOLN: 1000.0000 mg | Freq: Four times a day (QID) | ORAL | Status: AC

## 2017-02-21 MED ORDER — ARTIFICIAL TEARS OPHTHALMIC OINT
TOPICAL_OINTMENT | OPHTHALMIC | Status: DC | PRN
  Administered 2017-02-21: 1 via OPHTHALMIC

## 2017-02-21 MED ORDER — SODIUM CHLORIDE 0.9 % IV SOLN
0.0000 ug/kg/h | INTRAVENOUS | Status: DC
  Administered 2017-02-21: 0.3 ug/kg/h via INTRAVENOUS
  Filled 2017-02-21: qty 2

## 2017-02-21 MED ORDER — DEXMEDETOMIDINE HCL IN NACL 200 MCG/50ML IV SOLN
INTRAVENOUS | Status: AC
  Filled 2017-02-21: qty 50

## 2017-02-21 MED ORDER — MAGNESIUM SULFATE 4 GM/100ML IV SOLN
4.0000 g | Freq: Once | INTRAVENOUS | Status: AC
  Administered 2017-02-21: 4 g via INTRAVENOUS
  Filled 2017-02-21: qty 100

## 2017-02-21 MED ORDER — SODIUM CHLORIDE 0.9 % IV SOLN: 250.0000 mL | INTRAVENOUS | Status: DC

## 2017-02-21 MED ORDER — ROCURONIUM BROMIDE 10 MG/ML (PF) SYRINGE
PREFILLED_SYRINGE | INTRAVENOUS | Status: AC
  Filled 2017-02-21: qty 15

## 2017-02-21 MED ORDER — METOPROLOL TARTRATE 12.5 MG HALF TABLET: 12.5000 mg | ORAL_TABLET | Freq: Two times a day (BID) | ORAL | Status: DC

## 2017-02-21 MED ORDER — MIDAZOLAM HCL 5 MG/5ML IJ SOLN
INTRAMUSCULAR | Status: DC | PRN
  Administered 2017-02-21: 1 mg via INTRAVENOUS
  Administered 2017-02-21: 2 mg via INTRAVENOUS
  Administered 2017-02-21: 1 mg via INTRAVENOUS
  Administered 2017-02-21: 2 mg via INTRAVENOUS
  Administered 2017-02-21: 1 mg via INTRAVENOUS

## 2017-02-21 MED ORDER — 0.9 % SODIUM CHLORIDE (POUR BTL) OPTIME
TOPICAL | Status: DC | PRN
  Administered 2017-02-21: 2000 mL

## 2017-02-21 MED ORDER — INSULIN REGULAR BOLUS VIA INFUSION
0.0000 [IU] | Freq: Three times a day (TID) | INTRAVENOUS | Status: DC
  Filled 2017-02-21: qty 10

## 2017-02-21 MED ORDER — ACETAMINOPHEN 500 MG PO TABS
1000.0000 mg | ORAL_TABLET | Freq: Four times a day (QID) | ORAL | Status: AC
  Administered 2017-02-22 – 2017-02-26 (×20): 1000 mg via ORAL
  Filled 2017-02-21 (×22): qty 2

## 2017-02-21 MED ORDER — POTASSIUM CHLORIDE 10 MEQ/50ML IV SOLN: 10.0000 meq | INTRAVENOUS | Status: AC

## 2017-02-21 MED ORDER — PHENYLEPHRINE HCL 10 MG/ML IJ SOLN
INTRAVENOUS | Status: DC | PRN
  Administered 2017-02-21: 25 ug/min via INTRAVENOUS

## 2017-02-21 MED ORDER — PROTAMINE SULFATE 10 MG/ML IV SOLN
INTRAVENOUS | Status: AC
  Filled 2017-02-21: qty 50

## 2017-02-21 MED ORDER — PROTAMINE SULFATE 10 MG/ML IV SOLN
INTRAVENOUS | Status: DC | PRN
  Administered 2017-02-21: 350 mg via INTRAVENOUS

## 2017-02-21 MED ORDER — HEMOSTATIC AGENTS (NO CHARGE) OPTIME
TOPICAL | Status: DC | PRN
  Administered 2017-02-21: 1 via TOPICAL

## 2017-02-21 MED ORDER — ASPIRIN EC 325 MG PO TBEC: 325.0000 mg | DELAYED_RELEASE_TABLET | Freq: Every day | ORAL | Status: DC

## 2017-02-21 MED ORDER — METOPROLOL TARTRATE 25 MG/10 ML ORAL SUSPENSION: 12.5000 mg | Freq: Two times a day (BID) | ORAL | Status: DC

## 2017-02-21 MED ORDER — LACTATED RINGERS IV SOLN
INTRAVENOUS | Status: DC
  Administered 2017-02-22: 20 mL via INTRAVENOUS

## 2017-02-21 MED ORDER — ROCURONIUM BROMIDE 10 MG/ML (PF) SYRINGE
PREFILLED_SYRINGE | INTRAVENOUS | Status: DC | PRN
  Administered 2017-02-21: 40 mg via INTRAVENOUS
  Administered 2017-02-21 (×4): 50 mg via INTRAVENOUS

## 2017-02-21 MED ORDER — MORPHINE SULFATE (PF) 2 MG/ML IV SOLN
2.0000 mg | INTRAVENOUS | Status: DC | PRN
Start: 2017-02-21 — End: 2017-02-21

## 2017-02-21 MED ORDER — TRAMADOL HCL 50 MG PO TABS
50.0000 mg | ORAL_TABLET | ORAL | Status: DC | PRN
  Administered 2017-02-24 – 2017-02-25 (×3): 50 mg via ORAL
  Administered 2017-02-25: 100 mg via ORAL
  Administered 2017-02-26: 50 mg via ORAL
  Administered 2017-02-26 – 2017-02-27 (×2): 100 mg via ORAL
  Filled 2017-02-21 (×2): qty 2
  Filled 2017-02-21 (×3): qty 1
  Filled 2017-02-21: qty 2
  Filled 2017-02-21: qty 1

## 2017-02-21 MED ORDER — PLASMA-LYTE 148 IV SOLN
INTRAVENOUS | Status: DC | PRN
  Administered 2017-02-21: 500 mL via INTRAVASCULAR

## 2017-02-21 MED ORDER — HEPARIN SODIUM (PORCINE) 1000 UNIT/ML IJ SOLN
INTRAMUSCULAR | Status: AC
  Filled 2017-02-21: qty 1

## 2017-02-21 MED ORDER — SODIUM CHLORIDE 0.9% FLUSH: 3.0000 mL | INTRAVENOUS | Status: DC | PRN

## 2017-02-21 MED ORDER — PHENYLEPHRINE 40 MCG/ML (10ML) SYRINGE FOR IV PUSH (FOR BLOOD PRESSURE SUPPORT)
PREFILLED_SYRINGE | INTRAVENOUS | Status: AC
  Filled 2017-02-21: qty 20

## 2017-02-21 MED ORDER — NITROGLYCERIN 0.2 MG/ML ON CALL CATH LAB
INTRAVENOUS | Status: DC | PRN
  Administered 2017-02-21: 40 ug via INTRAVENOUS

## 2017-02-21 MED ORDER — LACTATED RINGERS IV SOLN: 500.0000 mL | Freq: Once | INTRAVENOUS | Status: DC | PRN

## 2017-02-21 MED ORDER — ACETAMINOPHEN 650 MG RE SUPP
650.0000 mg | Freq: Once | RECTAL | Status: AC
  Administered 2017-02-21: 650 mg via RECTAL

## 2017-02-21 MED ORDER — BISACODYL 10 MG RE SUPP: 10.0000 mg | Freq: Every day | RECTAL | Status: DC

## 2017-02-21 MED ORDER — PROPOFOL 10 MG/ML IV BOLUS
INTRAVENOUS | Status: AC
  Filled 2017-02-21: qty 20

## 2017-02-21 MED ORDER — HEPARIN SODIUM (PORCINE) 1000 UNIT/ML IJ SOLN
INTRAMUSCULAR | Status: DC | PRN
  Administered 2017-02-21: 2000 [IU] via INTRAVENOUS
  Administered 2017-02-21: 33000 [IU] via INTRAVENOUS

## 2017-02-21 MED ORDER — CHLORHEXIDINE GLUCONATE 0.12 % MT SOLN
15.0000 mL | OROMUCOSAL | Status: AC
  Administered 2017-02-21: 15 mL via OROMUCOSAL

## 2017-02-21 MED ORDER — LIDOCAINE 2% (20 MG/ML) 5 ML SYRINGE
INTRAMUSCULAR | Status: AC
  Filled 2017-02-21: qty 5

## 2017-02-21 MED ORDER — LACTATED RINGERS IV SOLN: INTRAVENOUS | Status: DC

## 2017-02-21 MED ORDER — EPHEDRINE SULFATE 50 MG/ML IJ SOLN
INTRAMUSCULAR | Status: DC | PRN
  Administered 2017-02-21 (×3): 10 mg via INTRAVENOUS

## 2017-02-21 MED ORDER — LACTATED RINGERS IV SOLN: INTRAVENOUS | Status: DC | PRN

## 2017-02-21 MED ORDER — FAMOTIDINE IN NACL 20-0.9 MG/50ML-% IV SOLN
20.0000 mg | Freq: Two times a day (BID) | INTRAVENOUS | Status: DC
  Administered 2017-02-21: 20 mg via INTRAVENOUS

## 2017-02-21 MED ORDER — MORPHINE SULFATE (PF) 4 MG/ML IV SOLN
2.0000 mg | INTRAVENOUS | Status: DC | PRN
Start: 2017-02-21 — End: 2017-02-25
  Administered 2017-02-22: 2 mg via INTRAVENOUS
  Administered 2017-02-22 (×3): 4 mg via INTRAVENOUS
  Filled 2017-02-21 (×5): qty 1

## 2017-02-21 MED ORDER — ORAL CARE MOUTH RINSE
15.0000 mL | OROMUCOSAL | Status: DC
  Administered 2017-02-21: 15 mL via OROMUCOSAL

## 2017-02-21 MED FILL — Nitroglycerin IV Soln 200 MCG/ML in D5W: INTRAVENOUS | Qty: 250 | Status: AC

## 2017-02-21 SURGICAL SUPPLY — 84 items
BAG DECANTER FOR FLEXI CONT (MISCELLANEOUS) ×4 IMPLANT
BANDAGE ACE 4X5 VEL STRL LF (GAUZE/BANDAGES/DRESSINGS) ×4 IMPLANT
BANDAGE ACE 6X5 VEL STRL LF (GAUZE/BANDAGES/DRESSINGS) ×4 IMPLANT
BANDAGE ELASTIC 4 VELCRO ST LF (GAUZE/BANDAGES/DRESSINGS) ×2 IMPLANT
BANDAGE ELASTIC 6 VELCRO ST LF (GAUZE/BANDAGES/DRESSINGS) ×2 IMPLANT
BASKET HEART  (ORDER IN 25'S) (MISCELLANEOUS) ×1
BASKET HEART (ORDER IN 25'S) (MISCELLANEOUS) ×1
BASKET HEART (ORDER IN 25S) (MISCELLANEOUS) ×2 IMPLANT
BLADE STERNUM SYSTEM 6 (BLADE) ×4 IMPLANT
BNDG GAUZE ELAST 4 BULKY (GAUZE/BANDAGES/DRESSINGS) ×4 IMPLANT
CANISTER SUCT 3000ML PPV (MISCELLANEOUS) ×4 IMPLANT
CANNULA EZ GLIDE AORTIC 21FR (CANNULA) ×4 IMPLANT
CATH CPB KIT HENDRICKSON (MISCELLANEOUS) ×4 IMPLANT
CATH ROBINSON RED A/P 18FR (CATHETERS) ×4 IMPLANT
CATH THORACIC 36FR (CATHETERS) ×4 IMPLANT
CATH THORACIC 36FR RT ANG (CATHETERS) ×4 IMPLANT
CLIP VESOCCLUDE MED 24/CT (CLIP) IMPLANT
CLIP VESOCCLUDE SM WIDE 24/CT (CLIP) ×6 IMPLANT
CRADLE DONUT ADULT HEAD (MISCELLANEOUS) ×4 IMPLANT
DRAPE CARDIOVASCULAR INCISE (DRAPES) ×4
DRAPE SLUSH/WARMER DISC (DRAPES) ×4 IMPLANT
DRAPE SRG 135X102X78XABS (DRAPES) ×2 IMPLANT
DRSG COVADERM 4X14 (GAUZE/BANDAGES/DRESSINGS) ×4 IMPLANT
ELECT REM PT RETURN 9FT ADLT (ELECTROSURGICAL) ×8
ELECTRODE REM PT RTRN 9FT ADLT (ELECTROSURGICAL) ×4 IMPLANT
FELT TEFLON 1X6 (MISCELLANEOUS) ×8 IMPLANT
GAUZE SPONGE 4X4 12PLY STRL (GAUZE/BANDAGES/DRESSINGS) ×8 IMPLANT
GAUZE SPONGE 4X4 12PLY STRL LF (GAUZE/BANDAGES/DRESSINGS) ×4 IMPLANT
GLOVE SURG SIGNA 7.5 PF LTX (GLOVE) ×12 IMPLANT
GOWN STRL REUS W/ TWL LRG LVL3 (GOWN DISPOSABLE) ×8 IMPLANT
GOWN STRL REUS W/ TWL XL LVL3 (GOWN DISPOSABLE) ×4 IMPLANT
GOWN STRL REUS W/TWL LRG LVL3 (GOWN DISPOSABLE) ×16
GOWN STRL REUS W/TWL XL LVL3 (GOWN DISPOSABLE) ×8
HEMOSTAT POWDER SURGIFOAM 1G (HEMOSTASIS) ×12 IMPLANT
HEMOSTAT SURGICEL 2X14 (HEMOSTASIS) ×4 IMPLANT
INSERT FOGARTY XLG (MISCELLANEOUS) IMPLANT
KIT BASIN OR (CUSTOM PROCEDURE TRAY) ×4 IMPLANT
KIT ROOM TURNOVER OR (KITS) ×4 IMPLANT
KIT SUCTION CATH 14FR (SUCTIONS) ×8 IMPLANT
KIT VASOVIEW HEMOPRO VH 3000 (KITS) ×4 IMPLANT
MARKER GRAFT CORONARY BYPASS (MISCELLANEOUS) ×12 IMPLANT
NS IRRIG 1000ML POUR BTL (IV SOLUTION) ×20 IMPLANT
PACK E OPEN HEART (SUTURE) ×4 IMPLANT
PACK OPEN HEART (CUSTOM PROCEDURE TRAY) ×4 IMPLANT
PAD ARMBOARD 7.5X6 YLW CONV (MISCELLANEOUS) ×8 IMPLANT
PAD ELECT DEFIB RADIOL ZOLL (MISCELLANEOUS) ×4 IMPLANT
PENCIL BUTTON HOLSTER BLD 10FT (ELECTRODE) ×4 IMPLANT
PUNCH AORTIC ROTATE  4.5MM 8IN (MISCELLANEOUS) ×2 IMPLANT
PUNCH AORTIC ROTATE 4.0MM (MISCELLANEOUS) IMPLANT
PUNCH AORTIC ROTATE 4.5MM 8IN (MISCELLANEOUS) IMPLANT
PUNCH AORTIC ROTATE 5MM 8IN (MISCELLANEOUS) IMPLANT
SET CARDIOPLEGIA MPS 5001102 (MISCELLANEOUS) ×2 IMPLANT
SUT BONE WAX W31G (SUTURE) ×4 IMPLANT
SUT MNCRL AB 4-0 PS2 18 (SUTURE) IMPLANT
SUT PROLENE 3 0 SH DA (SUTURE) ×4 IMPLANT
SUT PROLENE 4 0 RB 1 (SUTURE)
SUT PROLENE 4 0 SH DA (SUTURE) IMPLANT
SUT PROLENE 4-0 RB1 .5 CRCL 36 (SUTURE) IMPLANT
SUT PROLENE 6 0 C 1 30 (SUTURE) ×8 IMPLANT
SUT PROLENE 7 0 BV1 MDA (SUTURE) ×8 IMPLANT
SUT PROLENE 8 0 BV175 6 (SUTURE) IMPLANT
SUT STEEL 6MS V (SUTURE) ×4 IMPLANT
SUT STEEL STERNAL CCS#1 18IN (SUTURE) IMPLANT
SUT STEEL SZ 6 DBL 3X14 BALL (SUTURE) ×4 IMPLANT
SUT VIC AB 1 CTX 36 (SUTURE) ×8
SUT VIC AB 1 CTX36XBRD ANBCTR (SUTURE) ×4 IMPLANT
SUT VIC AB 2-0 CT1 27 (SUTURE) ×4
SUT VIC AB 2-0 CT1 TAPERPNT 27 (SUTURE) IMPLANT
SUT VIC AB 2-0 CTX 27 (SUTURE) IMPLANT
SUT VIC AB 3-0 SH 27 (SUTURE)
SUT VIC AB 3-0 SH 27X BRD (SUTURE) IMPLANT
SUT VIC AB 3-0 X1 27 (SUTURE) ×2 IMPLANT
SUT VICRYL 4-0 PS2 18IN ABS (SUTURE) IMPLANT
SYSTEM SAHARA CHEST DRAIN ATS (WOUND CARE) ×4 IMPLANT
TAPE CLOTH SURG 4X10 WHT LF (GAUZE/BANDAGES/DRESSINGS) ×2 IMPLANT
TOWEL GREEN STERILE (TOWEL DISPOSABLE) ×16 IMPLANT
TOWEL GREEN STERILE FF (TOWEL DISPOSABLE) ×8 IMPLANT
TOWEL OR 17X24 6PK STRL BLUE (TOWEL DISPOSABLE) ×8 IMPLANT
TOWEL OR 17X26 10 PK STRL BLUE (TOWEL DISPOSABLE) ×8 IMPLANT
TRAY FOLEY SILVER 16FR TEMP (SET/KITS/TRAYS/PACK) ×4 IMPLANT
TUBE FEEDING 8FR 16IN STR KANG (MISCELLANEOUS) ×4 IMPLANT
TUBING INSUFFLATION (TUBING) ×4 IMPLANT
UNDERPAD 30X30 (UNDERPADS AND DIAPERS) ×4 IMPLANT
WATER STERILE IRR 1000ML POUR (IV SOLUTION) ×8 IMPLANT

## 2017-02-21 NOTE — Anesthesia Procedure Notes (Signed)
Arterial Line Insertion Start/End12/02/2017 7:45 AM, 02/21/2017 7:50 AM Performed by: Lillia Abed, MD, Bryson Corona, CRNA, CRNA  Patient location: Pre-op. Preanesthetic checklist: patient identified, IV checked, site marked, risks and benefits discussed, surgical consent, monitors and equipment checked, pre-op evaluation, timeout performed and anesthesia consent Lidocaine 1% used for infiltration and patient sedated Left, radial was placed Catheter size: 20 G Hand hygiene performed  and maximum sterile barriers used   Attempts: 1 Procedure performed without using ultrasound guided technique. Following insertion, dressing applied and Biopatch. Post procedure assessment: normal  Patient tolerated the procedure well with no immediate complications.

## 2017-02-21 NOTE — Progress Notes (Signed)
  Echocardiogram 2D Echocardiogram and Echocardiogram Transesophageal has been performed.  Jennifer [name] 02/21/2017, 9:59 AM

## 2017-02-21 NOTE — Anesthesia Procedure Notes (Signed)
Procedure Name: Intubation Date/Time: 02/21/2017 8:58 AM Performed by: Bryson Corona, CRNA Pre-anesthesia Checklist: Patient identified, Emergency Drugs available, Suction available and Patient being monitored Patient Re-evaluated:Patient Re-evaluated prior to induction Oxygen Delivery Method: Circle System Utilized Preoxygenation: Pre-oxygenation with 100% oxygen Induction Type: IV induction Ventilation: Oral airway inserted - appropriate to patient size Laryngoscope Size: Mac and 3 Grade View: Grade I Tube type: Oral Tube size: 8.0 mm Number of attempts: 1 Airway Equipment and Method: Stylet and Oral airway Placement Confirmation: ETT inserted through vocal cords under direct vision,  positive ETCO2 and breath sounds checked- equal and bilateral Secured at: 21 cm Tube secured with: Tape Dental Injury: Teeth and Oropharynx as per pre-operative assessment

## 2017-02-21 NOTE — Interval H&P Note (Signed)
History and Physical Interval Note:  02/21/2017 8:15 AM  Jennifer [name]  has presented today for surgery, with the diagnosis of CAD  The various methods of treatment have been discussed with the patient and family. After consideration of risks, benefits and other options for treatment, the patient has consented to  Procedure(s): CORONARY ARTERY BYPASS GRAFTING (CABG) (N/A) TRANSESOPHAGEAL ECHOCARDIOGRAM (TEE) (N/A) as a surgical intervention .  The patient's history has been reviewed, patient examined, no change in status, stable for surgery.  I have reviewed the patient's chart and labs.  Questions were answered to the patient's satisfaction.     Melrose Nakayama

## 2017-02-21 NOTE — Procedures (Signed)
Extubation Procedure Note  Patient Details:   Name: Jennifer [name] DOB: [birth year] MRN: [id number]   Airway Documentation:     Evaluation  O2 sats: stable throughout and currently acceptable Complications: No apparent complications Patient did tolerate procedure well. Bilateral Breath Sounds: Clear   Yes   NIF -31, VC 642  Jennifer [name] T 02/21/2017, 5:36 PM

## 2017-02-21 NOTE — Anesthesia Procedure Notes (Signed)
Central Venous Catheter Insertion Performed by: Belinda Block, MD Start/End12/02/2017 7:45 AM, 02/21/2017 8:05 AM Patient location: Pre-op. Preanesthetic checklist: patient identified, IV checked, site marked, risks and benefits discussed, surgical consent, monitors and equipment checked, pre-op evaluation and timeout performed Position: Trendelenburg Lidocaine 1% used for infiltration and patient sedated Hand hygiene performed  and maximum sterile barriers used  PA cath was placed.Sheath introducer Swan type:thermodilution Procedure performed using ultrasound guided technique. Ultrasound Notes:anatomy identified and image(s) printed for medical record Attempts: 1 Following insertion, line sutured, dressing applied and Biopatch. Post procedure assessment: blood return through all ports  Patient tolerated the procedure well with no immediate complications.

## 2017-02-21 NOTE — Progress Notes (Signed)
Patient ID: Jennifer [name], female   DOB: [birth year], 66 y.o.   MRN: [id number]  TCTS Evening Rounds:   Hemodynamically stable  CI = 3  Extubated  Urine output good  CT output low  CBC    Component Value Date/Time   WBC 13.7 (H) 02/21/2017 1433   RBC 4.25 02/21/2017 1433   HGB 9.5 (L) 02/21/2017 1433   HCT 32.9 (L) 02/21/2017 1433   PLT 217 02/21/2017 1433   MCV 77.4 (L) 02/21/2017 1433   MCH 22.4 (L) 02/21/2017 1433   MCHC 28.9 (L) 02/21/2017 1433   RDW 23.9 (H) 02/21/2017 1433   LYMPHSABS 1.5 02/13/2017 1335   MONOABS 0.8 02/13/2017 1335   EOSABS 0.1 02/13/2017 1335   BASOSABS 0.0 02/13/2017 1335     BMET    Component Value Date/Time   NA 140 02/21/2017 1332   K 4.6 02/21/2017 1332   CL 103 02/21/2017 1332   CO2 26 02/20/2017 0146   GLUCOSE 127 (H) 02/21/2017 1332   BUN 7 02/21/2017 1332   CREATININE 0.70 02/21/2017 1332   CALCIUM 8.4 (L) 02/20/2017 0146   GFRNONAA 58 (L) 02/20/2017 0146   GFRAA >60 02/20/2017 0146     A/P:  Stable postop course. Continue current plans

## 2017-02-21 NOTE — Op Note (Signed)
NAMETACORA, Jennifer NO.:  [id number]  MEDICAL RECORD NO.:  [id number]  LOCATION:  [id number]                        FACILITY:  [facility]  PHYSICIAN:  Revonda Standard. Roxan Hockey, M.D.DATE OF BIRTH:  April 14, 1950  DATE OF PROCEDURE:  02/21/2017 DATE OF DISCHARGE:                              OPERATIVE REPORT   PREOPERATIVE DIAGNOSIS:  Left main and 3-vessel coronary artery disease, status post non-ST elevation myocardial infarction.  POSTOPERATIVE DIAGNOSIS:  Left main and 3-vessel coronary artery disease, status post non-ST elevation myocardial infarction.  PROCEDURES:  Median sternotomy, extracorporeal circulation, coronary artery bypass grafting x3 (left internal mammary artery to left anterior descending, saphenous vein graft to obtuse marginal, saphenous vein graft to distal right coronary artery), endoscopic vein harvest from left leg.  SURGEON:  Revonda Standard. Roxan Hockey, MD.  ASSISTING:  Jadene Pierini, PA-C.  ANESTHESIA:  General.  FINDINGS:  Transesophageal echocardiography showed preserved left ventricular systolic function.  There was a flail cord of the anterior leaflet of the mitral valve, but no mitral regurgitation.  No usable vein identified in right leg.  Vein from left leg large caliber, fair quality.  Mammary good quality, good quality targets.  CLINICAL NOTE:  Jennifer [name] is a 66 year old woman with multiple coronary risk factors and known moderate coronary artery disease.  She presented with symptoms of gastrointestinal bleed and anemia and also complained of chest pain.  She had an elevated troponin and ruled in for a non-ST elevation MI.  She underwent cardiac catheterization, which revealed a 60% ostial left main stenosis and total occlusion of her right coronary artery.  She was advised to undergo coronary artery bypass grafting. The indications, risks, benefits, and alternatives were discussed in detail with the patient.  She understood and accepted  the risks and agreed to proceed.  OPERATIVE NOTE:  Jennifer [name] was brought to the preoperative holding area on February 21, 2017.  Anesthesia placed a Swan-Ganz catheter and an arterial blood pressure monitoring line.  She was taken to the operating room, anesthetized, and intubated.  Intravenous antibiotics were administered.  A Foley catheter was placed.  Transesophageal echocardiography was performed by Dr. Lillia Abed; findings as previously noted.  The chest, abdomen, and legs were prepped and draped in usual sterile fashion.  An incision was made in the medial aspect of the right leg just below the knee.  A small vein was identified and followed for short distance, but it was not a vein that would be suitable for bypass grafting. Further exploration of the leg did not identify an acceptable vein. Therefore, the decision was made to make an incision on the medial aspect of the left leg.  There, a much better quality vein was found, although it did turn out to be a large-caliber vein once removed.  Simultaneously with the vein harvest, a median sternotomy was performed and the left internal mammary artery was harvested using standard technique.  2000 units of heparin was administered during the vessel harvest.  The remainder of full heparin dose was given prior to opening the pericardium.  After harvesting the conduits, the pericardium was opened.  After confirming adequate anticoagulation with ACT measurement, the aorta was cannulated via  concentric 2-0 Ethibond pledgeted pursestring sutures. There was some atherosclerotic plaque in the aorta very proximally and then distally at the takeoff of the innominate artery.  There was no plaque at the sites for the cannulation, cross-clamp placement, or the proximal anastomoses.  A dual-stage venous cannula was placed via a pursestring suture in the right atrium.  Cardiopulmonary bypass was initiated.  Flows were maintained per protocol.   Anticoagulation was monitored per protocol.  The coronary arteries were inspected and anastomotic sites were chosen.  The conduits were inspected and cut to length.  A foam pad was placed in the pericardium to insulate the heart. A temperature probe was placed in the myocardial septum and a cardioplegia cannula was placed in the ascending aorta.  The aorta was crossclamped.  The left ventricle was emptied via the aortic root vent.  Cardiac arrest then was achieved a combination of cold antegrade blood cardioplegia and topical iced saline.  1 L of cardioplegia was administered.  There was a rapid diastolic arrest and septal cooling to 10 degrees Celsius.  A reversed saphenous vein graft was placed end-to-side to the distal right coronary artery.  Again, the vein was fair quality, large caliber, thin walled.  The distal right coronary artery was a 1.5-mm, good quality target at the site of the anastomosis.  The end-to-side anastomosis was performed with a running 7-0 Prolene suture.  At the completion of the anastomosis, cardioplegia was administered down the vein graft.  There were good flow and good hemostasis.  Next, a reversed saphenous vein graft was placed end-to-side to the obtuse marginal branch of the left circumflex.  This was a large common marginal that then trifurcated into 3 branches.  The anastomosis was performed just proximal to the trifurcation and actually carried out onto the largest branch due to the size of the vein.  The vein again was large caliber and fair quality.  An end-to-side anastomosis was performed with a running 7-0 Prolene suture.  A probe passed easily into all 3 branches distally and it should be noted that all anastomoses were probed proximally and distally before tying the sutures.  Cardioplegia was administered down the graft and there was good flow and there also was good hemostasis.  Additional cardioplegia was administered down the aortic  root.  Next, the left internal mammary artery was brought through a window in the pericardium.  The distal end was beveled and it was anastomosed end-to- side to the mid LAD.  The LAD was a 2-mm, good quality target at the site of the anastomosis.  The mammary was a 2-mm, good quality conduit. The end-to-side anastomosis was performed with a running 8-0 Prolene suture.  At the completion of the anastomosis, the bulldog clamp was briefly removed.  Rapid septal rewarming was noted.  The bulldog clamp was replaced.  The mammary pedicle was tacked to the epicardial surface of the heart with 6-0 Prolene sutures.  Additional cardioplegia was administered.  The vein grafts were cut to length.  The cardioplegia cannula was removed from the ascending aorta. The proximal vein graft anastomoses were performed to 4.5-mm punch aortotomies with running 6-0 Prolene sutures.  At the completion of the final proximal anastomosis, the patient was placed in Trendelenburg position.  Lidocaine was administered.  The bulldog clamp was again removed from the left mammary artery.  The left ventricle and aortic root were de-aired and the aortic crossclamp was removed.  The total cross-clamp time was 68 minutes.  The patient initially  was bradycardic. She did not require defibrillation.  While rewarming was completed, all proximal and distal anastomoses were inspected for hemostasis.  Epicardial pacing wires were placed on the right ventricle and right atrium and DDD pacing was initiated at 80 beats per minute.  When the patient had rewarmed to a core temperature of 37 degrees Celsius, she was weaned from cardiopulmonary bypass on the first attempt.  She did not require inotropic support.  She did require Neo-Synephrine to maintain vascular tone.  The initial cardiac index was greater than 2 L/min/m2, and the patient remained hemodynamically stable throughout the post-bypass period.  Post-bypass transesophageal  echocardiography was unchanged from the prebypass study.  There was a flail chord of the anterior leaflet, but no prolapse or mitral regurgitation.  There was preserved left ventricular function.  A test dose of protamine was administered and it was well tolerated.  The atrial and aortic cannulae were removed.  The remainder of the protamine was administered without incident.  The chest was irrigated with warm saline.  Hemostasis was achieved.  Left pleural and mediastinal chest tubes were placed through separate subcostal incisions.  The pericardium was not closed.  The sternum was closed with a combination of single and double heavy gauge stainless steel wires. The pectoralis fascia, subcutaneous tissue, and skin were closed in a standard fashion.  All sponge, needle, and instrument counts were correct at the end of the procedure.  The patient was taken from the operating room to the Surgical Intensive Care Unit intubated and in good condition.     Revonda Standard Roxan Hockey, M.D.     SCH/MEDQ  D:  02/21/2017  T:  02/21/2017  Job:  [id number]

## 2017-02-21 NOTE — Transfer of Care (Signed)
Immediate Anesthesia Transfer of Care Note  Patient: Jennifer [name]  Procedure(s) Performed: CORONARY ARTERY BYPASS GRAFTING (CABG) times three. LIMA to LAD, SVG to OM and SVG to PDA (N/A Chest) TRANSESOPHAGEAL ECHOCARDIOGRAM (TEE) (N/A )  Patient Location: ICU  Anesthesia Type:General  Level of Consciousness: Patient remains intubated per anesthesia plan  Airway & Oxygen Therapy: Patient placed on Ventilator (see vital sign flow sheet for setting)  Post-op Assessment: Report given to RN and Post -op Vital signs reviewed and stable  Post vital signs: Reviewed and stable  Last Vitals:  Vitals:   02/20/17 2329 02/21/17 0510  BP: 140/62 (!) 156/70  Pulse:  60  Resp: 16 18  Temp: 36.8 C 36.6 C  SpO2:  98%    Last Pain:  Vitals:   02/21/17 0510  TempSrc: Oral  PainSc:       Patients Stated Pain Goal: 2 (70/26/37 8588)  Complications: No apparent anesthesia complications

## 2017-02-21 NOTE — Brief Op Note (Signed)
02/13/2017 - 02/21/2017  6:14 PM  PATIENT:  Jennifer [name]  66 y.o. female  PRE-OPERATIVE DIAGNOSIS:  CAD  POST-OPERATIVE DIAGNOSIS:  CAD  PROCEDURE:  Procedure(s): CORONARY ARTERY BYPASS GRAFTING (CABG) times three. LIMA to LAD, SVG to OM and SVG to PDA (N/A) TRANSESOPHAGEAL ECHOCARDIOGRAM (TEE) (N/A) LIMA-LAD SVG-OM SVG-PDA  SURGEON:  Surgeon(s) and Role:    * Melrose Nakayama, MD - Primary  PHYSICIAN ASSISTANT: WAYNE GOLD PA-C  ASSISTANTS: none   ANESTHESIA:   general  EBL:  1250 mL   BLOOD ADMINISTERED:none  DRAINS: PLEURAL AND PERICARDIAL CHEST DRAINS   LOCAL MEDICATIONS USED:  NONE  SPECIMEN:  No Specimen  DISPOSITION OF SPECIMEN:  N/A  COUNTS:  YES  TOURNIQUET:  * No tourniquets in log *  DICTATION: .Other Dictation: Dictation Number [id number]  PLAN OF CARE: Admit to inpatient   PATIENT DISPOSITION:  ICU - intubated and hemodynamically stable.   Delay start of Pharmacological VTE agent (>24hrs) due to surgical blood loss or risk of bleeding: yes  COMPLICATIONS: NO KNOWN

## 2017-02-22 ENCOUNTER — Encounter (HOSPITAL_COMMUNITY): Payer: Self-pay | Admitting: Thoracic Surgery (Cardiothoracic Vascular Surgery)

## 2017-02-22 ENCOUNTER — Inpatient Hospital Stay (HOSPITAL_COMMUNITY): Payer: Medicare Other

## 2017-02-22 LAB — CBC
HCT: 35.5 % — ABNORMAL LOW (ref 36.0–46.0)
HCT: 33.6 % — ABNORMAL LOW (ref 36.0–46.0)
Hemoglobin: 10.4 g/dL — ABNORMAL LOW (ref 12.0–15.0)
Hemoglobin: 9.7 g/dL — ABNORMAL LOW (ref 12.0–15.0)
MCH: 22.7 pg — ABNORMAL LOW (ref 26.0–34.0)
MCH: 22.6 pg — ABNORMAL LOW (ref 26.0–34.0)
MCHC: 29.3 g/dL — ABNORMAL LOW (ref 30.0–36.0)
MCHC: 28.9 g/dL — ABNORMAL LOW (ref 30.0–36.0)
MCV: 77.3 fL — ABNORMAL LOW (ref 78.0–100.0)
MCV: 78.3 fL (ref 78.0–100.0)
Platelets: 310 10*3/uL (ref 150–400)
Platelets: 249 10*3/uL (ref 150–400)
RBC: 4.59 MIL/uL (ref 3.87–5.11)
RBC: 4.29 MIL/uL (ref 3.87–5.11)
RDW: 25.1 % — ABNORMAL HIGH (ref 11.5–15.5)
RDW: 25.6 % — ABNORMAL HIGH (ref 11.5–15.5)
WBC: 16.5 10*3/uL — ABNORMAL HIGH (ref 4.0–10.5)
WBC: 16.5 10*3/uL — ABNORMAL HIGH (ref 4.0–10.5)

## 2017-02-22 LAB — POCT I-STAT, CHEM 8
BUN: 11 mg/dL (ref 6–20)
BUN: 16 mg/dL (ref 6–20)
Calcium, Ion: 0.92 mmol/L — ABNORMAL LOW (ref 1.15–1.40)
Calcium, Ion: 1.22 mmol/L (ref 1.15–1.40)
Chloride: 101 mmol/L (ref 101–111)
Chloride: 101 mmol/L (ref 101–111)
Creatinine, Ser: 0.8 mg/dL (ref 0.44–1.00)
Creatinine, Ser: 1.3 mg/dL — ABNORMAL HIGH (ref 0.44–1.00)
Glucose, Bld: 119 mg/dL — ABNORMAL HIGH (ref 65–99)
Glucose, Bld: 129 mg/dL — ABNORMAL HIGH (ref 65–99)
HCT: 36 % (ref 36.0–46.0)
HCT: 33 % — ABNORMAL LOW (ref 36.0–46.0)
Hemoglobin: 12.2 g/dL (ref 12.0–15.0)
Hemoglobin: 11.2 g/dL — ABNORMAL LOW (ref 12.0–15.0)
Potassium: 7 mmol/L (ref 3.5–5.1)
Potassium: 4.5 mmol/L (ref 3.5–5.1)
Sodium: 150 mmol/L — ABNORMAL HIGH (ref 135–145)
Sodium: 138 mmol/L (ref 135–145)
TCO2: 28 mmol/L (ref 22–32)
TCO2: 24 mmol/L (ref 22–32)

## 2017-02-22 LAB — POCT I-STAT 4, (NA,K, GLUC, HGB,HCT)
Glucose, Bld: 118 mg/dL — ABNORMAL HIGH (ref 65–99)
HCT: 30 % — ABNORMAL LOW (ref 36.0–46.0)
Hemoglobin: 10.2 g/dL — ABNORMAL LOW (ref 12.0–15.0)
Potassium: 4.5 mmol/L (ref 3.5–5.1)
Sodium: 142 mmol/L (ref 135–145)

## 2017-02-22 LAB — CREATININE, SERUM
Creatinine, Ser: 1.54 mg/dL — ABNORMAL HIGH (ref 0.44–1.00)
GFR calc Af Amer: 39 mL/min — ABNORMAL LOW (ref >60)
GFR calc non Af Amer: 34 mL/min — ABNORMAL LOW (ref >60)

## 2017-02-22 LAB — BASIC METABOLIC PANEL
Anion gap: 8 (ref 5–15)
BUN: 11 mg/dL (ref 6–20)
CO2: 24 mmol/L (ref 22–32)
Calcium: 8.3 mg/dL — ABNORMAL LOW (ref 8.9–10.3)
Chloride: 104 mmol/L (ref 101–111)
Creatinine, Ser: 0.96 mg/dL (ref 0.44–1.00)
GFR calc Af Amer: >60 mL/min (ref >60)
GFR calc non Af Amer: 60 mL/min — ABNORMAL LOW (ref >60)
Glucose, Bld: 112 mg/dL — ABNORMAL HIGH (ref 65–99)
Potassium: 5 mmol/L (ref 3.5–5.1)
Sodium: 136 mmol/L (ref 135–145)

## 2017-02-22 LAB — GLUCOSE, CAPILLARY
Glucose-Capillary: 106 mg/dL — ABNORMAL HIGH (ref 65–99)
Glucose-Capillary: 112 mg/dL — ABNORMAL HIGH (ref 65–99)
Glucose-Capillary: 118 mg/dL — ABNORMAL HIGH (ref 65–99)
Glucose-Capillary: 124 mg/dL — ABNORMAL HIGH (ref 65–99)
Glucose-Capillary: 125 mg/dL — ABNORMAL HIGH (ref 65–99)
Glucose-Capillary: 126 mg/dL — ABNORMAL HIGH (ref 65–99)
Glucose-Capillary: 128 mg/dL — ABNORMAL HIGH (ref 65–99)

## 2017-02-22 LAB — MAGNESIUM
Magnesium: 2.5 mg/dL — ABNORMAL HIGH (ref 1.7–2.4)
Magnesium: 2.4 mg/dL (ref 1.7–2.4)

## 2017-02-22 MED ORDER — GABAPENTIN 600 MG PO TABS: 300.0000 mg | ORAL_TABLET | ORAL | Status: DC

## 2017-02-22 MED ORDER — GABAPENTIN 600 MG PO TABS
300.0000 mg | ORAL_TABLET | Freq: Two times a day (BID) | ORAL | Status: DC
  Administered 2017-02-22 – 2017-02-27 (×10): 300 mg via ORAL
  Filled 2017-02-22 (×9): qty 1

## 2017-02-22 MED ORDER — ORAL CARE MOUTH RINSE
15.0000 mL | Freq: Two times a day (BID) | OROMUCOSAL | Status: DC
  Administered 2017-02-22 – 2017-02-25 (×5): 15 mL via OROMUCOSAL

## 2017-02-22 MED ORDER — ALLOPURINOL 300 MG PO TABS
300.0000 mg | ORAL_TABLET | Freq: Every day | ORAL | Status: DC
  Administered 2017-02-22 – 2017-02-27 (×6): 300 mg via ORAL
  Filled 2017-02-22 (×6): qty 1

## 2017-02-22 MED ORDER — FUROSEMIDE 10 MG/ML IJ SOLN
40.0000 mg | Freq: Once | INTRAMUSCULAR | Status: AC
  Administered 2017-02-22: 40 mg via INTRAVENOUS
  Filled 2017-02-22: qty 4

## 2017-02-22 MED ORDER — DULOXETINE HCL 30 MG PO CPEP
30.0000 mg | ORAL_CAPSULE | Freq: Every day | ORAL | Status: DC
  Administered 2017-02-22 – 2017-02-27 (×5): 30 mg via ORAL
  Filled 2017-02-22 (×6): qty 1

## 2017-02-22 MED ORDER — ASPIRIN EC 81 MG PO TBEC
81.0000 mg | DELAYED_RELEASE_TABLET | Freq: Every day | ORAL | Status: DC
  Administered 2017-02-22 – 2017-02-27 (×6): 81 mg via ORAL
  Filled 2017-02-22 (×6): qty 1

## 2017-02-22 MED ORDER — METOCLOPRAMIDE HCL 5 MG/ML IJ SOLN
10.0000 mg | Freq: Four times a day (QID) | INTRAMUSCULAR | Status: DC
  Administered 2017-02-22 – 2017-02-27 (×22): 10 mg via INTRAVENOUS
  Filled 2017-02-22 (×22): qty 2

## 2017-02-22 MED ORDER — ENOXAPARIN SODIUM 40 MG/0.4ML ~~LOC~~ SOLN
40.0000 mg | Freq: Every day | SUBCUTANEOUS | Status: DC
  Administered 2017-02-22: 40 mg via SUBCUTANEOUS
  Filled 2017-02-22: qty 0.4

## 2017-02-22 MED ORDER — GABAPENTIN 600 MG PO TABS
600.0000 mg | ORAL_TABLET | Freq: Every day | ORAL | Status: DC
  Administered 2017-02-22 – 2017-02-26 (×5): 600 mg via ORAL
  Filled 2017-02-22 (×6): qty 1

## 2017-02-22 MED ORDER — INSULIN ASPART 100 UNIT/ML ~~LOC~~ SOLN
0.0000 [IU] | SUBCUTANEOUS | Status: DC
  Administered 2017-02-22 – 2017-02-23 (×2): 2 [IU] via SUBCUTANEOUS

## 2017-02-22 NOTE — Anesthesia Postprocedure Evaluation (Signed)
Anesthesia Post Note  Patient: Jennifer [name]  Procedure(s) Performed: CORONARY ARTERY BYPASS GRAFTING (CABG) times three. LIMA to LAD, SVG to OM and SVG to PDA (N/A Chest) TRANSESOPHAGEAL ECHOCARDIOGRAM (TEE) (N/A )     Patient location during evaluation: SICU Anesthesia Type: General Level of consciousness: sedated Pain management: pain level controlled Vital Signs Assessment: post-procedure vital signs reviewed and stable Respiratory status: patient remains intubated per anesthesia plan Cardiovascular status: stable Postop Assessment: no apparent nausea or vomiting Anesthetic complications: no    Last Vitals:  Vitals:   02/22/17 0715 02/22/17 0800  BP:  127/60  Pulse: 89 89  Resp: 15 15  Temp: 37.4 C 37.4 C  SpO2: 95% 95%    Last Pain:  Vitals:   02/22/17 0846  TempSrc:   PainSc: 7                  Lelar Farewell DAVID

## 2017-02-22 NOTE — Progress Notes (Signed)
1 Day Post-Op Procedure(s) (LRB): CORONARY ARTERY BYPASS GRAFTING (CABG) times three. LIMA to LAD, SVG to OM and SVG to PDA (N/A) TRANSESOPHAGEAL ECHOCARDIOGRAM (TEE) (N/A) Subjective: No complaints. Denies pain and nausea  Objective: Vital signs in last 24 hours: Temp:  [96.8 F (36 C)-99.7 F (37.6 C)] 99.3 F (37.4 C) (12/13 0800) Pulse Rate:  [36-92] 89 (12/13 0800) Cardiac Rhythm: A-V Sequential paced (12/13 0800) Resp:  [0-28] 15 (12/13 0800) BP: (85-127)/(46-67) 127/60 (12/13 0800) SpO2:  [91 %-100 %] 95 % (12/13 0800) Arterial Line BP: (86-141)/(41-90) 117/90 (12/13 0800) FiO2 (%):  [40 %-50 %] 40 % (12/12 1705) Weight:  [225 lb 1.4 oz (102.1 kg)] 225 lb 1.4 oz (102.1 kg) (12/13 0620)  Hemodynamic parameters for last 24 hours: PAP: (24-45)/(13-25) 32/19 CO:  [4 L/min-7.2 L/min] 7.2 L/min CI:  [2 L/min/m2-3.8 L/min/m2] 3.8 L/min/m2  Intake/Output from previous day: 12/12 0701 - 12/13 0700 In: 4608.3 [I.V.:3661.3; Blood:517; NG/GT:30; IV Piggyback:400] Out: 3428 [Urine:1845; Blood:1250; Chest Tube:330] Intake/Output this shift: Total I/O In: 136.3 [I.V.:136.3] Out: 50 [Urine:40; Chest Tube:10]  General appearance: alert, cooperative and no distress Neurologic: intact Heart: regular rate and rhythm Lungs: diminished breath sounds bibasilar Abdomen: mildly distended, nontender, + BS  Lab Results: Recent Labs    02/21/17 2012  02/21/17 2017 02/22/17 0422  WBC 18.7*  --   --  16.5*  HGB 10.6*   < > 10.5* 10.4*  HCT 36.2   < > 31.0* 35.5*  PLT 281  --   --  310   < > = values in this interval not displayed.   BMET:  Recent Labs    02/20/17 0146  02/21/17 2017 02/22/17 0422  NA 138   < > 139 136  K 3.7   < > 4.8 5.0  CL 105   < > 103 104  CO2 26  --   --  24  GLUCOSE 93   < > 112* 112*  BUN 8   < > 9 11  CREATININE 0.98   < > 0.70 0.96  CALCIUM 8.4*  --   --  8.3*   < > = values in this interval not displayed.    PT/INR:  Recent Labs     02/21/17 1433  LABPROT 15.1  INR 1.20   ABG    Component Value Date/Time   PHART 7.334 (L) 02/21/2017 1829   HCO3 23.1 02/21/2017 1829   TCO2 26 02/21/2017 2017   ACIDBASEDEF 3.0 (H) 02/21/2017 1829   O2SAT 95.0 02/21/2017 1829   CBG (last 3)  Recent Labs    02/21/17 2102 02/22/17 0015 02/22/17 0428  GLUCAP 118* 128* 124*    Assessment/Plan: S/P Procedure(s) (LRB): CORONARY ARTERY BYPASS GRAFTING (CABG) times three. LIMA to LAD, SVG to OM and SVG to PDA (N/A) TRANSESOPHAGEAL ECHOCARDIOGRAM (TEE) (N/A) -POD # 1 CV- s/p CABG x 3. Good cardiac index.   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 Resume Xarelto prior to dc  Dc chest tubes  OOB, ambulate   LOS: 9 days    Melrose Nakayama 02/22/2017

## 2017-02-22 NOTE — Progress Notes (Signed)
[facility] BRIEF SICU PROGRESS NOTE  1 Day Post-Op  S/P Procedure(s) (LRB): CORONARY ARTERY BYPASS GRAFTING (CABG) times three. LIMA to LAD, SVG to OM and SVG to PDA (N/A) TRANSESOPHAGEAL ECHOCARDIOGRAM (TEE) (N/A)   Stable day AAI paced w/ stable BP Breathing comfortably O2 sats 98% on 2 L/min UOP adequate Labs okay although creatinine up 1.5  Plan: Continue current plan  Rexene Alberts, MD 02/22/2017 7:55 PM

## 2017-02-22 NOTE — Anesthesia Preprocedure Evaluation (Signed)
Anesthesia Evaluation  Patient identified by MRN, date of birth, ID band Patient awake    Reviewed: Allergy & Precautions, NPO status , Patient's Chart, lab work & pertinent test results  Airway Mallampati: I  TM Distance: >3 FB Neck ROM: Full    Dental   Pulmonary Current Smoker,    Pulmonary exam normal        Cardiovascular hypertension, Pt. on medications + Past MI  Normal cardiovascular exam     Neuro/Psych Anxiety Depression    GI/Hepatic GERD  Medicated and Controlled,  Endo/Other    Renal/GU      Musculoskeletal   Abdominal   Peds  Hematology   Anesthesia Other Findings   Reproductive/Obstetrics                             Anesthesia Physical Anesthesia Plan  ASA: III  Anesthesia Plan: General   Post-op Pain Management:    Induction: Intravenous  PONV Risk Score and Plan: 2 and Treatment may vary due to age or medical condition  Airway Management Planned: Oral ETT  Additional Equipment: Arterial line, CVP, PA Cath, TEE, 3D TEE and Ultrasound Guidance Line Placement  Intra-op Plan:   Post-operative Plan: Post-operative intubation/ventilation  Informed Consent: I have reviewed the patients History and Physical, chart, labs and discussed the procedure including the risks, benefits and alternatives for the proposed anesthesia with the patient or authorized representative who has indicated his/her understanding and acceptance.     Plan Discussed with: CRNA and Surgeon  Anesthesia Plan Comments:         Anesthesia Quick Evaluation

## 2017-02-23 ENCOUNTER — Inpatient Hospital Stay (HOSPITAL_COMMUNITY): Payer: Medicare Other

## 2017-02-23 LAB — TYPE AND SCREEN
ABO/RH(D): A POS
Antibody Screen: NEGATIVE
Unit division: 0
Unit division: 0
Unit division: 0
Unit division: 0

## 2017-02-23 LAB — BASIC METABOLIC PANEL
Anion gap: 10 (ref 5–15)
BUN: 22 mg/dL — ABNORMAL HIGH (ref 6–20)
CO2: 24 mmol/L (ref 22–32)
Calcium: 8.6 mg/dL — ABNORMAL LOW (ref 8.9–10.3)
Chloride: 103 mmol/L (ref 101–111)
Creatinine, Ser: 2.14 mg/dL — ABNORMAL HIGH (ref 0.44–1.00)
GFR calc Af Amer: 26 mL/min — ABNORMAL LOW (ref >60)
GFR calc non Af Amer: 23 mL/min — ABNORMAL LOW (ref >60)
Glucose, Bld: 115 mg/dL — ABNORMAL HIGH (ref 65–99)
Potassium: 4.6 mmol/L (ref 3.5–5.1)
Sodium: 137 mmol/L (ref 135–145)

## 2017-02-23 LAB — CBC
HCT: 32.6 % — ABNORMAL LOW (ref 36.0–46.0)
Hemoglobin: 9.3 g/dL — ABNORMAL LOW (ref 12.0–15.0)
MCH: 22.8 pg — ABNORMAL LOW (ref 26.0–34.0)
MCHC: 28.5 g/dL — ABNORMAL LOW (ref 30.0–36.0)
MCV: 79.9 fL (ref 78.0–100.0)
Platelets: 249 10*3/uL (ref 150–400)
RBC: 4.08 MIL/uL (ref 3.87–5.11)
RDW: 26.1 % — ABNORMAL HIGH (ref 11.5–15.5)
WBC: 15.5 10*3/uL — ABNORMAL HIGH (ref 4.0–10.5)

## 2017-02-23 LAB — BPAM RBC
Blood Product Expiration Date: 201901012359
Blood Product Expiration Date: 201901012359
Blood Product Expiration Date: 201901012359
Blood Product Expiration Date: 201901012359
ISSUE DATE / TIME: 201812102022
ISSUE DATE / TIME: 201812102022
ISSUE DATE / TIME: 201812120921
ISSUE DATE / TIME: 201812120921
Unit Type and Rh: 6200
Unit Type and Rh: 6200
Unit Type and Rh: 6200
Unit Type and Rh: 6200

## 2017-02-23 LAB — GLUCOSE, CAPILLARY
Glucose-Capillary: 106 mg/dL — ABNORMAL HIGH (ref 65–99)
Glucose-Capillary: 112 mg/dL — ABNORMAL HIGH (ref 65–99)
Glucose-Capillary: 114 mg/dL — ABNORMAL HIGH (ref 65–99)
Glucose-Capillary: 121 mg/dL — ABNORMAL HIGH (ref 65–99)
Glucose-Capillary: 124 mg/dL — ABNORMAL HIGH (ref 65–99)
Glucose-Capillary: 134 mg/dL — ABNORMAL HIGH (ref 65–99)
Glucose-Capillary: 92 mg/dL (ref 65–99)

## 2017-02-23 MED ORDER — DOPAMINE-DEXTROSE 3.2-5 MG/ML-% IV SOLN
3.0000 ug/kg/min | INTRAVENOUS | Status: DC
Start: 2017-02-23 — End: 2017-02-25
  Administered 2017-02-23: 3 ug/kg/min via INTRAVENOUS
  Filled 2017-02-23: qty 250

## 2017-02-23 MED ORDER — ENOXAPARIN SODIUM 30 MG/0.3ML ~~LOC~~ SOLN
30.0000 mg | Freq: Every day | SUBCUTANEOUS | Status: DC
  Administered 2017-02-23 – 2017-02-26 (×4): 30 mg via SUBCUTANEOUS
  Filled 2017-02-23 (×4): qty 0.3

## 2017-02-23 MED ORDER — INSULIN ASPART 100 UNIT/ML ~~LOC~~ SOLN
0.0000 [IU] | Freq: Three times a day (TID) | SUBCUTANEOUS | Status: DC
  Administered 2017-02-23 – 2017-02-24 (×2): 2 [IU] via SUBCUTANEOUS

## 2017-02-23 MED ORDER — ALBUMIN HUMAN 5 % IV SOLN
12.5000 g | Freq: Once | INTRAVENOUS | Status: AC
  Administered 2017-02-23: 12.5 g via INTRAVENOUS
  Filled 2017-02-23: qty 250

## 2017-02-23 MED FILL — Lidocaine HCl IV Inj 20 MG/ML: INTRAVENOUS | Qty: 5 | Status: AC

## 2017-02-23 MED FILL — Mannitol IV Soln 20%: INTRAVENOUS | Qty: 500 | Status: AC

## 2017-02-23 MED FILL — Sodium Chloride IV Soln 0.9%: INTRAVENOUS | Qty: 2000 | Status: AC

## 2017-02-23 MED FILL — Sodium Bicarbonate IV Soln 8.4%: INTRAVENOUS | Qty: 50 | Status: AC

## 2017-02-23 MED FILL — Electrolyte-R (PH 7.4) Solution: INTRAVENOUS | Qty: 3000 | Status: AC

## 2017-02-23 MED FILL — Heparin Sodium (Porcine) Inj 1000 Unit/ML: INTRAMUSCULAR | Qty: 20 | Status: AC

## 2017-02-23 NOTE — Progress Notes (Signed)
Progress Note  Patient Name: Jennifer [name] Date of Encounter: 02/23/2017  Primary Cardiologist:   Kate Sable, MD   Subjective   She denies pain or SOB.  Up in the chair.  Has ambulated.    Inpatient Medications    Scheduled Meds: . acetaminophen  1,000 mg Oral Q6H   Or  . acetaminophen (TYLENOL) oral liquid 160 mg/5 mL  1,000 mg Per Tube Q6H  . allopurinol  300 mg Oral Daily  . aspirin EC  81 mg Oral Daily  . atorvastatin  40 mg Oral Daily  . bisacodyl  10 mg Oral Daily   Or  . bisacodyl  10 mg Rectal Daily  . docusate sodium  200 mg Oral Daily  . DULoxetine  30 mg Oral Daily  . enoxaparin (LOVENOX) injection  40 mg Subcutaneous QHS  . gabapentin  300 mg Oral BID WC  . gabapentin  600 mg Oral QHS  . insulin aspart  0-15 Units Subcutaneous TID WC  . mouth rinse  15 mL Mouth Rinse BID  . metoCLOPramide (REGLAN) injection  10 mg Intravenous Q6H  . pantoprazole  40 mg Oral Daily  . sodium chloride flush  3 mL Intravenous Q12H   Continuous Infusions: . sodium chloride Stopped (02/22/17 1200)  . sodium chloride    . sodium chloride Stopped (02/21/17 1524)  . lactated ringers    . lactated ringers 20 mL/hr at 02/23/17 0600  . lactated ringers Stopped (02/22/17 0800)  . nitroGLYCERIN Stopped (02/21/17 1430)  . phenylephrine (NEO-SYNEPHRINE) Adult infusion Stopped (02/22/17 1400)   PRN Meds: sodium chloride, lactated ringers, metoprolol tartrate, midazolam, morphine injection, ondansetron (ZOFRAN) IV, oxyCODONE, sodium chloride flush, traMADol   Vital Signs    Vitals:   02/23/17 0525 02/23/17 0600 02/23/17 0700 02/23/17 0800  BP: (!) 97/50 111/73 (!) 94/49 (!) 114/54  Pulse: 80 90 80 67  Resp: (!) 8 (!) 21 (!) 9 19  Temp:    98.1 F (36.7 C)  TempSrc:    Oral  SpO2: 96% (!) 89% 99% 100%  Weight:      Height:        Intake/Output Summary (Last 24 hours) at 02/23/2017 1016 Last data filed at 02/23/2017 0800 Gross per 24 hour  Intake 806.3 ml  Output  340 ml  Net 466.3 ml   Filed Weights   02/21/17 0510 02/22/17 0620 02/23/17 0500  Weight: 216 lb (98 kg) 225 lb 1.4 oz (102.1 kg) 212 lb 11.9 oz (96.5 kg)    Telemetry    NSR, with occasional atrial pacing. - Personally Reviewed  ECG    NA - Personally Reviewed  Physical Exam   GEN: No acute distress.   Neck: No  JVD Cardiac: RRR, no murmurs, rubs, or gallops.  Respiratory: Clear clear to auscultation bilaterally. GI: Soft, nontender, non-distended  MS:  Trace  edema; No deformity. Neuro:  Nonfocal  Psych: Normal affect   Labs    Chemistry Recent Labs  Lab 02/20/17 0146  02/22/17 0422 02/22/17 1603 02/22/17 1611 02/23/17 0747  NA 138   < > 136  --  138 137  K 3.7   < > 5.0  --  4.5 4.6  CL 105   < > 104  --  101 103  CO2 26  --  24  --   --  24  GLUCOSE 93   < > 112*  --  129* 115*  BUN 8   < > 11  --  16 22*  CREATININE 0.98   < > 0.96 1.54* 1.30* 2.14*  CALCIUM 8.4*  --  8.3*  --   --  8.6*  GFRNONAA 58*   < > 60* 34*  --  23*  GFRAA >60   < > >60 39*  --  26*  ANIONGAP 7  --  8  --   --  10   < > = values in this interval not displayed.     Hematology Recent Labs  Lab 02/22/17 0422 02/22/17 1603 02/22/17 1611 02/23/17 0930  WBC 16.5* 16.5*  --  PENDING  RBC 4.59 4.29  --  4.08  HGB 10.4* 9.7* 11.2* 9.3*  HCT 35.5* 33.6* 33.0* 32.6*  MCV 77.3* 78.3  --  79.9  MCH 22.7* 22.6*  --  22.8*  MCHC 29.3* 28.9*  --  28.5*  RDW 25.1* 25.6*  --  26.1*  PLT 310 249  --  249    Cardiac EnzymesNo results for input(s): TROPONINI in the last 168 hours. No results for input(s): TROPIPOC in the last 168 hours.   BNPNo results for input(s): BNP, PROBNP in the last 168 hours.   DDimer No results for input(s): DDIMER in the last 168 hours.   Radiology    Dg Chest Port 1 View  Result Date: 02/23/2017 CLINICAL DATA:  Coronary bypass grafting EXAM: PORTABLE CHEST 1 VIEW COMPARISON:  02/22/2017 FINDINGS: Swan-Ganz catheter and bilateral thoracostomy catheters  have been removed. No recurrent pneumothorax is seen. Cardiomegaly is again noted with postoperative change. Mild left basilar atelectatic changes are noted. No bony abnormality is seen. IMPRESSION: Mild left basilar atelectasis. Electronically Signed   By: Inez Catalina M.D.   On: 02/23/2017 07:19   Dg Chest Port 1 View  Result Date: 02/22/2017 CLINICAL DATA:  Shortness of breath. Chest tube present. Status post CABG. EXAM: PORTABLE CHEST 1 VIEW COMPARISON:  02/21/2017 FINDINGS: Sequelae of CABG are again identified. Endotracheal and enteric tubes have been removed. Bilateral chest tubes remain, and both have been mildly retracted in the interim. Right jugular Swan-Ganz catheter projects over the right pulmonary artery. The cardiac silhouette remains enlarged. The lungs are mildly hypoinflated with mild pulmonary vascular congestion but no overt edema. There is mild left basilar atelectasis and likely a small persistent left pleural effusion. No pneumothorax is identified. IMPRESSION: 1. Interval extubation. Remaining support devices as above with mild interval retraction of both chest tubes. No pneumothorax. 2. Hypoinflation with mild pulmonary vascular congestion. Electronically Signed   By: Logan Bores M.D.   On: 02/22/2017 07:56   Dg Chest Port 1 View  Result Date: 02/21/2017 CLINICAL DATA:  Status post CABG.  ETT placement EXAM: PORTABLE CHEST 1 VIEW COMPARISON:  CT chest 02/13/2017 FINDINGS: Endotracheal tube with the tip 4.6 cm above the carina. Nasogastric tube coursing below the diaphragm. Right jugular central venous catheter with the tip projecting over the right ventricular outflow tract. Bilateral chest tubes. No pneumothorax. Small left pleural effusion. Bilateral interstitial prominence. Stable cardiomegaly. Interval CABG. No acute osseous abnormality. IMPRESSION: 1. Support lines and tubing in satisfactory position. 2. Mild pulmonary vascular congestion. Electronically Signed   By: Kathreen Devoid   On: 02/21/2017 15:06    Cardiac Studies   TEE 12/12 Result status: Final result   Aortic valve: The valve is trileaflet. No regurgitation.  Mitral valve: Ruptured cordae A2 Minimal MR Mild leaflet thickening is present. Mild regurgitation.  Right ventricle: Normal cavity size, wall thickness and ejection fraction.  Patient Profile     67 y.o. female with elevated troponin, severeiron defanemia. Hx moderate CAD, on Xarelto (for prior history of DVT/PE), recent vomiting, diarrhea. EGD and colon negative, on PPI, s/p transfusion, cath w/ 3 v dz, now status post CABG.   Assessment & Plan    CAD:  CABG.  Doing well.  Ambulated.    AKI:  Creat is up.  Up 5 liters.  However, she does not seem to have volume overload.  No dyspnea, no crackles on exam.  No edema on CXR.  Continue to follow creat.  I would not suggest lasix but will defer to primary team.   DVT:  She had an event greater than 10 years ago.  I have no idea whether it was provoked or unprovoked.  However, there was a note by Dr. Bronson Ing that he was intentionally continuing this.  Assuming this was unprovoked and the patient's preference to remain on lifelong anticoagulation this should be continued at discharge although we could use lower dose per current data.    For questions or updates, please contact [place] Please consult [email or web address] for contact info under Cardiology/STEMI.   Signed, Minus Breeding, MD  02/23/2017, 10:16 AM

## 2017-02-23 NOTE — Progress Notes (Signed)
2 Days Post-Op Procedure(s) (LRB): CORONARY ARTERY BYPASS GRAFTING (CABG) times three. LIMA to LAD, SVG to OM and SVG to PDA (N/A) TRANSESOPHAGEAL ECHOCARDIOGRAM (TEE) (N/A) Subjective: C/o incisional and back pain  Objective: Vital signs in last 24 hours: Temp:  [97.7 F (36.5 C)-99.3 F (37.4 C)] 98.4 F (36.9 C) (12/14 0300) Pulse Rate:  [79-90] 90 (12/14 0600) Cardiac Rhythm: Atrial paced;Heart block (12/14 0400) Resp:  [8-27] 21 (12/14 0600) BP: (91-144)/(45-78) 111/73 (12/14 0600) SpO2:  [89 %-100 %] 89 % (12/14 0600) Arterial Line BP: (104-117)/(80-90) 104/90 (12/13 1100) Weight:  [212 lb 11.9 oz (96.5 kg)] 212 lb 11.9 oz (96.5 kg) (12/14 0500)  Hemodynamic parameters for last 24 hours: PAP: (32-42)/(18-20) 42/18 CO:  [7.2 L/min] 7.2 L/min CI:  [3.8 L/min/m2] 3.8 L/min/m2  Intake/Output from previous day: 12/13 0701 - 12/14 0700 In: 1031.4 [P.O.:290; I.V.:741.4] Out: 640 [Urine:630; Chest Tube:10] Intake/Output this shift: No intake/output data recorded.  General appearance: alert, cooperative and no distress Neurologic: intact Heart: regular rate and rhythm Lungs: diminished breath sounds bibasilar Abdomen: normal findings: soft, non-tender  Lab Results: Recent Labs    02/22/17 0422 02/22/17 1603 02/22/17 1611  WBC 16.5* 16.5*  --   HGB 10.4* 9.7* 11.2*  HCT 35.5* 33.6* 33.0*  PLT 310 249  --    BMET:  Recent Labs    02/22/17 0422 02/22/17 1603 02/22/17 1611  NA 136  --  138  K 5.0  --  4.5  CL 104  --  101  CO2 24  --   --   GLUCOSE 112*  --  129*  BUN 11  --  16  CREATININE 0.96 1.54* 1.30*  CALCIUM 8.3*  --   --     PT/INR:  Recent Labs    02/21/17 1433  LABPROT 15.1  INR 1.20   ABG    Component Value Date/Time   PHART 7.334 (L) 02/21/2017 1829   HCO3 23.1 02/21/2017 1829   TCO2 24 02/22/2017 1611   ACIDBASEDEF 3.0 (H) 02/21/2017 1829   O2SAT 95.0 02/21/2017 1829   CBG (last 3)  Recent Labs    02/22/17 2007 02/23/17 0021  02/23/17 0334  GLUCAP 125* 112* 121*    Assessment/Plan: S/P Procedure(s) (LRB): CORONARY ARTERY BYPASS GRAFTING (CABG) times three. LIMA to LAD, SVG to OM and SVG to PDA (N/A) TRANSESOPHAGEAL ECHOCARDIOGRAM (TEE) (N/A) -CV- in SR with rate in 60s- continue to hold beta blocker  BP has been soft  RESP- IS for basilar atelectasis  RENAL- labs pending. Creatinine was up slightly yesterday evening  ENDO- CBG well controlled- change to AC/HS  Continue to mobilize  LOS: 10 days    Melrose Nakayama 02/23/2017

## 2017-02-23 NOTE — Care Management Note (Signed)
Case Management Note  Patient Details  Name: Jennifer [name] MRN: [id number] Date of Birth: [birth year]  Subjective/Objective:  From home, pta indep, POD 2 CABG.                    Action/Plan: NCM will follow for dc needs.   Expected Discharge Date:  02/15/17               Expected Discharge Plan:  Home/Self Care  In-House Referral:  [facility]  Discharge planning Services  CM Consult  Post Acute Care Choice:    Choice offered to:     DME Arranged:    DME Agency:     HH Arranged:    HH Agency:     Status of Service:  In process, will continue to follow  If discussed at Long Length of Stay Meetings, dates discussed:    Additional Comments:  Zenon Mayo, RN 02/23/2017, 11:36 AM

## 2017-02-24 ENCOUNTER — Inpatient Hospital Stay (HOSPITAL_COMMUNITY): Payer: Medicare Other

## 2017-02-24 LAB — BASIC METABOLIC PANEL
Anion gap: 5 (ref 5–15)
BUN: 25 mg/dL — ABNORMAL HIGH (ref 6–20)
CO2: 28 mmol/L (ref 22–32)
Calcium: 8.5 mg/dL — ABNORMAL LOW (ref 8.9–10.3)
Chloride: 103 mmol/L (ref 101–111)
Creatinine, Ser: 1.66 mg/dL — ABNORMAL HIGH (ref 0.44–1.00)
GFR calc Af Amer: 36 mL/min — ABNORMAL LOW (ref >60)
GFR calc non Af Amer: 31 mL/min — ABNORMAL LOW (ref >60)
Glucose, Bld: 122 mg/dL — ABNORMAL HIGH (ref 65–99)
Potassium: 4.3 mmol/L (ref 3.5–5.1)
Sodium: 136 mmol/L (ref 135–145)

## 2017-02-24 LAB — GLUCOSE, CAPILLARY
Glucose-Capillary: 129 mg/dL — ABNORMAL HIGH (ref 65–99)
Glucose-Capillary: 92 mg/dL (ref 65–99)
Glucose-Capillary: 104 mg/dL — ABNORMAL HIGH (ref 65–99)
Glucose-Capillary: 122 mg/dL — ABNORMAL HIGH (ref 65–99)

## 2017-02-24 LAB — CBC
HCT: 27.7 % — ABNORMAL LOW (ref 36.0–46.0)
Hemoglobin: 8 g/dL — ABNORMAL LOW (ref 12.0–15.0)
MCH: 23.1 pg — ABNORMAL LOW (ref 26.0–34.0)
MCHC: 28.9 g/dL — ABNORMAL LOW (ref 30.0–36.0)
MCV: 79.8 fL (ref 78.0–100.0)
Platelets: 212 10*3/uL (ref 150–400)
RBC: 3.47 MIL/uL — ABNORMAL LOW (ref 3.87–5.11)
RDW: 25.9 % — ABNORMAL HIGH (ref 11.5–15.5)
WBC: 9.6 10*3/uL (ref 4.0–10.5)

## 2017-02-25 LAB — BASIC METABOLIC PANEL
Anion gap: 6 (ref 5–15)
BUN: 17 mg/dL (ref 6–20)
CO2: 27 mmol/L (ref 22–32)
Calcium: 8.6 mg/dL — ABNORMAL LOW (ref 8.9–10.3)
Chloride: 106 mmol/L (ref 101–111)
Creatinine, Ser: 0.9 mg/dL (ref 0.44–1.00)
GFR calc Af Amer: >60 mL/min (ref >60)
GFR calc non Af Amer: >60 mL/min (ref >60)
Glucose, Bld: 100 mg/dL — ABNORMAL HIGH (ref 65–99)
Potassium: 4.3 mmol/L (ref 3.5–5.1)
Sodium: 139 mmol/L (ref 135–145)

## 2017-02-25 LAB — CBC
HCT: 27.8 % — ABNORMAL LOW (ref 36.0–46.0)
Hemoglobin: 8.1 g/dL — ABNORMAL LOW (ref 12.0–15.0)
MCH: 23.5 pg — ABNORMAL LOW (ref 26.0–34.0)
MCHC: 29.1 g/dL — ABNORMAL LOW (ref 30.0–36.0)
MCV: 80.8 fL (ref 78.0–100.0)
Platelets: 266 10*3/uL (ref 150–400)
RBC: 3.44 MIL/uL — ABNORMAL LOW (ref 3.87–5.11)
RDW: 26.5 % — ABNORMAL HIGH (ref 11.5–15.5)
WBC: 7.6 10*3/uL (ref 4.0–10.5)

## 2017-02-25 LAB — GLUCOSE, CAPILLARY: Glucose-Capillary: 97 mg/dL (ref 65–99)

## 2017-02-25 MED ORDER — FUROSEMIDE 10 MG/ML IJ SOLN
40.0000 mg | Freq: Once | INTRAMUSCULAR | Status: AC
  Administered 2017-02-25: 40 mg via INTRAVENOUS
  Filled 2017-02-25: qty 4

## 2017-02-25 NOTE — Progress Notes (Signed)
Report called to [facility] on 4E. Patient going to room 17. All belongings are accounted for. Will take patient when lunch is finished.

## 2017-02-25 NOTE — Progress Notes (Signed)
Pt received from [place]. Telemetry applied. VSS. Pt oriented to room and made comfortable. Dietary services called and dinner tray rerouted to 4E Call light in reach, will continue to monitor.  Clyde Canterbury, RN

## 2017-02-25 NOTE — Progress Notes (Signed)
4 Days Post-Op Procedure(s) (LRB): CORONARY ARTERY BYPASS GRAFTING (CABG) times three. LIMA to LAD, SVG to OM and SVG to PDA (N/A) TRANSESOPHAGEAL ECHOCARDIOGRAM (TEE) (N/A) Subjective: Renal fx better - start lasix  Walking in hall   A-paced for HR 65  Objective: Vital signs in last 24 hours: Temp:  [98.1 F (36.7 C)-99.1 F (37.3 C)] 98.4 F (36.9 C) (12/16 0735) Pulse Rate:  [81-91] 89 (12/16 1000) Cardiac Rhythm: Atrial paced (12/16 0740) Resp:  [13-33] 13 (12/16 1000) BP: (106-177)/(49-115) 129/65 (12/16 1000) SpO2:  [75 %-100 %] 100 % (12/16 1000) Weight:  [224 lb 6.9 oz (101.8 kg)] 224 lb 6.9 oz (101.8 kg) (12/16 0500)  Hemodynamic parameters for last 24 hours:    Intake/Output from previous day: 12/15 0701 - 12/16 0700 In: 1874.2 [P.O.:720; I.V.:604.2] Out: 1640 [Urine:1640] Intake/Output this shift: Total I/O In: 270 [P.O.:240; I.V.:30] Out: 73 [Urine:90; Stool:1]        Exam    General- alert and comfortable   Lungs- clear without rales, wheezes   Cor- regular rate and rhythm, no murmur , gallop   Abdomen- soft, non-tender   Extremities - warm, non-tender, minimal edema   Neuro- oriented, appropriate, no focal weakness   Lab Results: Recent Labs    02/24/17 0247 02/25/17 0404  WBC 9.6 7.6  HGB 8.0* 8.1*  HCT 27.7* 27.8*  PLT 212 266   BMET:  Recent Labs    02/24/17 0247 02/25/17 0404  NA 136 139  K 4.3 4.3  CL 103 106  CO2 28 27  GLUCOSE 122* 100*  BUN 25* 17  CREATININE 1.66* 0.90  CALCIUM 8.5* 8.6*    PT/INR: No results for input(s): LABPROT, INR in the last 72 hours. ABG    Component Value Date/Time   PHART 7.334 (L) 02/21/2017 1829   HCO3 23.1 02/21/2017 1829   TCO2 24 02/22/2017 1611   ACIDBASEDEF 3.0 (H) 02/21/2017 1829   O2SAT 95.0 02/21/2017 1829   CBG (last 3)  Recent Labs    02/24/17 1703 02/24/17 2128 02/25/17 0732  GLUCAP 104* 122* 97    Assessment/Plan: S/P Procedure(s) (LRB): CORONARY ARTERY BYPASS  GRAFTING (CABG) times three. LIMA to LAD, SVG to OM and SVG to PDA (N/A) TRANSESOPHAGEAL ECHOCARDIOGRAM (TEE) (N/A) Mobilize Diabetes control d/c tubes/lines Plan for transfer to step-down: see transfer orders   LOS: 12 days    Jennifer [name] 02/25/2017

## 2017-02-26 ENCOUNTER — Inpatient Hospital Stay (HOSPITAL_COMMUNITY): Payer: Medicare Other

## 2017-02-26 LAB — BASIC METABOLIC PANEL
Anion gap: 7 (ref 5–15)
BUN: 14 mg/dL (ref 6–20)
CO2: 30 mmol/L (ref 22–32)
Calcium: 8.9 mg/dL (ref 8.9–10.3)
Chloride: 103 mmol/L (ref 101–111)
Creatinine, Ser: 0.88 mg/dL (ref 0.44–1.00)
GFR calc Af Amer: >60 mL/min (ref >60)
GFR calc non Af Amer: >60 mL/min (ref >60)
Glucose, Bld: 99 mg/dL (ref 65–99)
Potassium: 3.9 mmol/L (ref 3.5–5.1)
Sodium: 140 mmol/L (ref 135–145)

## 2017-02-26 LAB — CBC
HCT: 27 % — ABNORMAL LOW (ref 36.0–46.0)
Hemoglobin: 7.9 g/dL — ABNORMAL LOW (ref 12.0–15.0)
MCH: 23.5 pg — ABNORMAL LOW (ref 26.0–34.0)
MCHC: 29.3 g/dL — ABNORMAL LOW (ref 30.0–36.0)
MCV: 80.4 fL (ref 78.0–100.0)
Platelets: 289 10*3/uL (ref 150–400)
RBC: 3.36 MIL/uL — ABNORMAL LOW (ref 3.87–5.11)
RDW: 26.6 % — ABNORMAL HIGH (ref 11.5–15.5)
WBC: 7 10*3/uL (ref 4.0–10.5)

## 2017-02-26 MED ORDER — LISINOPRIL 10 MG PO TABS
10.0000 mg | ORAL_TABLET | Freq: Every day | ORAL | Status: DC
  Administered 2017-02-26: 10 mg via ORAL
  Filled 2017-02-26: qty 1

## 2017-02-26 NOTE — Discharge Summary (Signed)
[place] Discharge Summary  Patient ID: Jennifer [name] MRN: [id number] DOB/AGE: 67-Jan-1952 67 y.o.  Admit date: 02/13/2017 Discharge date: 02/27/2017  Admission Diagnoses: Non-STEMI  Discharge Diagnoses:  Principal Problem:   NSTEMI (non-ST elevated myocardial infarction) [facility]) Active Problems:   Essential hypertension   Hyperlipidemia   Tobacco use disorder   Symptomatic anemia   Elevated troponin   Blood in stool   Benign neoplasm of sigmoid colon   S/P CABG x 3  Patient Active Problem List   Diagnosis Date Noted  . S/P CABG x 3 02/21/2017  . Blood in stool   . Benign neoplasm of sigmoid colon   . Elevated troponin   . Symptomatic anemia 02/13/2017  . NSTEMI (non-ST elevated myocardial infarction) ([place]) 02/13/2017  . Symptomatic stenosis of right carotid artery without infarction 11/01/2016  . Syncope and collapse 08/10/2016  . Morbid obesity ([place]) 08/10/2016  . Low back pain 07/28/2016  . Gait abnormality 07/28/2016  . Carotid artery stenosis 07/28/2016  . Paresthesia 07/28/2016  . Dizziness 06/20/2016  . Numbness 06/20/2016  . CAD in native artery 01/08/2015  . Chest pain 01/06/2015  . Essential hypertension 01/06/2015  . Hyperlipidemia 01/06/2015  . History of pulmonary embolus (PE) 01/06/2015  . Leukocytosis 01/06/2015  . Tobacco use disorder 01/06/2015  . History of DVT (deep vein thrombosis) 01/06/2015    History of Present Illness: at time of consultation   The patient is a 66 year old female with a history of multiple cardiac comorbidities including coronary artery disease, hypertension, hyperlipidemia, previous history of DVT/PE who presented to the emergency department at [facility] with nausea and vomiting of brown emesis.  She also has been having symptoms of significant fatigue and weakness with decreasing exercise tolerance over the past several weeks.  She also describes some chest discomfort that primarily felt like a burning sensation.   She was found to have a significant anemia with a hemoglobin of 7.0 and underwent transfusion.  She was found to have an elevated troponin of 9.96.  EKG showed sinus rhythm with nonspecific ST changes.  A CTA of the aorta and  abdomen and pelvis showed no acute abnormalities.  She was transferred to [facility] for further management and cardiology was consulted.  She underwent upper GI endoscopy. Findings on his exam were all normal.  She also had colonoscopy and they found 2 polyps per patient. I do not see the report.   She was cleared to proceed with further cardiology evaluation and catheterization was done today with findings noted below.  We are asked to see the patient in cardiothoracic surgical consultation for consideration of CABG. The patient has a 50-pack-year smoking history and states she has recently quit.  She also reports a 30 pound weight loss in approximately the last month primarily related to poor appetite.  The patient was seen and evaluated as were her studies by Dr. Roxan Hockey who agreed with recommendations to proceed with coronary artery bypass grafting.   Discharged Condition: good  Hospital Course: The patient was found to be a adequate candidate for proceeding with CABG surgery and on 02/21/2017 where she underwent the below described procedure.  She tolerated well was taken to the surgical intensive care unit in stable condition.  Postoperative hospital course: The patient is overall progressed nicely.  She did initially require atrial pacing due to sinus bradycardia which has resolved over time.  She was weaned from the ventilator without difficulty using standard protocols.  All routine lines, monitors and  drainage devices have been discussed the need in the standard fashion.  Blood sugars have been under adequate control using usual measures.  She did have some mild acute renal insufficiency which has resolved over time.  Most recent creatinine is 0.88.  She does have an  expected acute blood loss anemia and values are stable.  Incisions are noted to be healing well without evidence of infection.  She is tolerating gradually increasing activities using a standard postop cardiac rehab protocols.  At time of discharge the patient is felt to be quite stable.   Consults: GI medicine  Significant Diagnostic Studies: angiography: Cardiac catheterization  Treatments: surgery:    DATE OF PROCEDURE:  02/21/2017 DATE OF DISCHARGE:                              OPERATIVE REPORT   PREOPERATIVE DIAGNOSIS:  Left main and 3-vessel coronary artery disease, status post non-ST elevation myocardial infarction.  POSTOPERATIVE DIAGNOSIS:  Left main and 3-vessel coronary artery disease, status post non-ST elevation myocardial infarction.  PROCEDURES:  Median sternotomy, extracorporeal circulation, coronary artery bypass grafting x3 (left internal mammary artery to left anterior descending, saphenous vein graft to obtuse marginal, saphenous vein graft to distal right coronary artery), endoscopic vein harvest of left leg.  SURGEON:  Revonda Standard. Roxan Hockey, MD.  ASSISTINGJadene Pierini, PA-C.  ANESTHESIA:  General.    Discharge Exam: Blood pressure (!) 149/57, pulse 84, temperature 98.1 F (36.7 C), temperature source Oral, resp. rate 15, height 5\' 5"  (1.651 m), weight 227 lb 8.2 oz (103.2 kg), SpO2 99 %.   General appearance: alert, cooperative and no distress Heart: regular rate and rhythm Lungs: clear to auscultation bilaterally Abdomen: benign Extremities: min edema Wound: incis healing well   Disposition: 01-Home or Self Care  Discharge Instructions    Amb Referral to Cardiac Rehabilitation   Complete by:  As directed    Diagnosis:  CABG   CABG X ___:  3   Discharge patient   Complete by:  As directed    Discharge disposition:  01-Home or Self Care   Discharge patient date:  02/27/2017     Allergies as of 02/27/2017      Reactions    Bee Venom Anaphylaxis      Medication List    STOP taking these medications   XARELTO 20 MG Tabs tablet Generic drug:  rivaroxaban     TAKE these medications   allopurinol 300 MG tablet Commonly known as:  ZYLOPRIM Take 300 mg by mouth daily.   aspirin 81 MG EC tablet Take 1 tablet (81 mg total) by mouth daily. Start taking on:  02/28/2017   atorvastatin 40 MG tablet Commonly known as:  LIPITOR Take 1 tablet (40 mg total) by mouth daily. What changed:    medication strength  how much to take   calcium carbonate 500 MG chewable tablet Commonly known as:  TUMS - dosed in mg elemental calcium Chew 6 tablets by mouth 3 (three) times daily with meals.   DULoxetine 30 MG capsule Commonly known as:  CYMBALTA Take 30 mg by mouth daily.   gabapentin 600 MG tablet Commonly known as:  NEURONTIN Take 300-600 mg by mouth See admin instructions. Take 300 mg twice daily IN THE MORNING AND AT NOON AND  600 mg at bedtime.   lisinopril 20 MG tablet Commonly known as:  PRINIVIL,ZESTRIL Take 1 tablet (20 mg total) by mouth  daily. Start taking on:  02/28/2017   metoprolol tartrate 25 MG tablet Commonly known as:  LOPRESSOR Take 0.5 tablets (12.5 mg total) by mouth 2 (two) times daily.   MUSCLE RUB 10-15 % Crea Apply 1 application topically 2 (two) times daily as needed for muscle pain.   oxyCODONE 5 MG immediate release tablet Commonly known as:  Oxy IR/ROXICODONE Take 1-2 tablets (5-10 mg total) by mouth every 6 (six) hours as needed for severe pain. What changed:    medication strength  how much to take  when to take this  reasons to take this   ranitidine 150 MG tablet Commonly known as:  ZANTAC Take 150 mg by mouth daily as needed for heartburn.   Vitamin D 2000 units Caps Take 2,000 Units by mouth daily.      Follow-up Information    Melrose Nakayama, MD Follow up.   Specialty:  Cardiothoracic Surgery Why:  Appointment to see the surgeon Dr.  Roxan Hockey on 2019 at noon.  Please obtain a chest x-ray at [facility] imaging at 11:30 AM.  [facility] imaging is located in the same office complex on the first floor. Contact information: [address] [place] [address] [phone]        Herminio Commons, MD Follow up.   Specialty:  Cardiology Contact information: [place] [address] [phone]          The patient has been discharged on:   1.Beta Blocker:  Yes [ y  ]                              No   [  ]                              If No, reason:  2.Ace Inhibitor/ARB: Yes [ y  ]                                     No  [    ]                                     If No, reason:  3.Statin:   Yes [ y  ]                  No  [   ]                  If No, reason:  4.Ecasa:  Yes  [ y  ]                  No   [   ]                  If No, reason:  Signed: John Giovanni 02/27/2017, 12:17 PM

## 2017-02-26 NOTE — Discharge Instructions (Signed)
Endoscopic Saphenous Vein Harvesting, Care After °Refer to this sheet in the next few weeks. These instructions provide you with information about caring for yourself after your procedure. Your health care provider may also give you more specific instructions. Your treatment has been planned according to current medical practices, but problems sometimes occur. Call your health care provider if you have any problems or questions after your procedure. °What can I expect after the procedure? °After the procedure, it is common to have: °· Pain. °· Bruising. °· Swelling. °· Numbness. ° °Follow these instructions at home: °Medicine °· Take over-the-counter and prescription medicines only as told by your health care provider. °· Do not drive or operate heavy machinery while taking prescription pain medicine. °Incision care ° °· Follow instructions from your health care provider about how to take care of the cut made during surgery (incision). Make sure you: °? Wash your hands with soap and water before you change your bandage (dressing). If soap and water are not available, use hand sanitizer. °? Change your dressing as told by your health care provider. °? Leave stitches (sutures), skin glue, or adhesive strips in place. These skin closures may need to be in place for 2 weeks or longer. If adhesive strip edges start to loosen and curl up, you may trim the loose edges. Do not remove adhesive strips completely unless your health care provider tells you to do that. °· Check your incision area every day for signs of infection. Check for: °? More redness, swelling, or pain. °? More fluid or blood. °? Warmth. °? Pus or a bad smell. °General instructions °· Raise (elevate) your legs above the level of your heart while you are sitting or lying down. °· Do any exercises your health care providers have given you. These may include deep breathing, coughing, and walking exercises. °· Do not shower, take baths, swim, or use a hot tub  unless told by your health care provider. °· Wear your elastic stocking if told by your health care provider. °· Keep all follow-up visits as told by your health care provider. This is important. °Contact a health care provider if: °· Medicine does not help your pain. °· Your pain gets worse. °· You have new leg bruises or your leg bruises get bigger. °· You have a fever. °· Your leg feels numb. °· You have more redness, swelling, or pain around your incision. °· You have more fluid or blood coming from your incision. °· Your incision feels warm to the touch. °· You have pus or a bad smell coming from your incision. °Get help right away if: °· Your pain is severe. °· You develop pain, tenderness, warmth, redness, or swelling in any part of your leg. °· You have chest pain. °· You have trouble breathing. °This information is not intended to replace advice given to you by your health care provider. Make sure you discuss any questions you have with your health care provider. °Document Released: 11/09/2010 Document Revised: 08/05/2015 Document Reviewed: 01/11/2015 °Elsevier Interactive Patient Education © 2018 [employer] °Coronary Artery Bypass Grafting, Care After °These instructions give you information on caring for yourself after your procedure. Your doctor may also give you more specific instructions. Call your doctor if you have any problems or questions after your procedure. °Follow these instructions at home: °· Only take medicine as told by your doctor. Take medicines exactly as told. Do not stop taking medicines or start any new medicines without talking to your doctor first. °·   Take your pulse as told by your doctor. °· Do deep breathing as told by your doctor. Use your breathing device (incentive spirometer), if given, to practice deep breathing several times a day. Support your chest with a pillow or your arms when you take deep breaths or cough. °· Keep the area clean, dry, and protected where the  surgery cuts (incisions) were made. Remove bandages (dressings) only as told by your doctor. If strips were applied to surgical area, do not take them off. They fall off on their own. °· Check the surgery area daily for puffiness (swelling), redness, or leaking fluid. °· If surgery cuts were made in your legs: °? Avoid crossing your legs. °? Avoid sitting for long periods of time. Change positions every 30 minutes. °? Raise your legs when you are sitting. Place them on pillows. °· Wear stockings that help keep blood clots from forming in your legs (compression stockings). °· Only take sponge baths until your doctor says it is okay to take showers. Pat the surgery area dry. Do not rub the surgery area with a washcloth or towel. Do not bathe, swim, or use a hot tub until your doctor says it is okay. °· Eat foods that are high in fiber. These include raw fruits and vegetables, whole grains, beans, and nuts. Choose lean meats. Avoid canned, processed, and fried foods. °· Drink enough fluids to keep your pee (urine) clear or pale yellow. °· Weigh yourself every day. °· Rest and limit activity as told by your doctor. You may be told to: °? Stop any activity if you have chest pain, shortness of breath, changes in heartbeat, or dizziness. Get help right away if this happens. °? Move around often for short amounts of time or take short walks as told by your doctor. Gradually become more active. You may need help to strengthen your muscles and build endurance. °? Avoid lifting, pushing, or pulling anything heavier than 10 pounds (4.5 kg) for at least 6 weeks after surgery. °· Do not drive until your doctor says it is okay. °· Ask your doctor when you can go back to work. °· Ask your doctor when you can begin sexual activity again. °· Follow up with your doctor as told. °Contact a doctor if: °· You have puffiness, redness, more pain, or fluid draining from the incision site. °· You have a fever. °· You have puffiness in your  ankles or legs. °· You have pain in your legs. °· You gain 2 or more pounds (0.9 kg) a day. °· You feel sick to your stomach (nauseous) or throw up (vomit). °· You have watery poop (diarrhea). °Get help right away if: °· You have chest pain that goes to your jaw or arms. °· You have shortness of breath. °· You have a fast or irregular heartbeat. °· You notice a "clicking" in your breastbone when you move. °· You have numbness or weakness in your arms or legs. °· You feel dizzy or light-headed. °This information is not intended to replace advice given to you by your health care provider. Make sure you discuss any questions you have with your health care provider. °Document Released: 03/04/2013 Document Revised: 08/05/2015 Document Reviewed: 08/06/2012 °Elsevier Interactive Patient Education © 2017 [employer] Inc. ° °

## 2017-02-26 NOTE — Progress Notes (Signed)
CARDIAC REHAB PHASE I   PRE:  Rate/Rhythm: 73 SR  BP:  Supine:   Sitting: 150/75  Standing:    SaO2: 95%RA  MODE:  Ambulation: 320 ft   POST:  Rate/Rhythm: 96 SR  BP:  Supine:   Sitting: 184/72  Standing:    SaO2: 93%RA 0935-1002 Pt walked 320 ft on RA with rolling walker and minimal asst. She stated she has rolling walker at home. BP elevated after walk but tolerated well. Stated she will increase distance next walk. To recliner after walk. Encouraged three walks today.    Graylon Good, RN BSN  02/26/2017 9:59 AM

## 2017-02-26 NOTE — Progress Notes (Signed)
EPW rolled and taped per verbal order of Jadene Pierini PA. HR 75-85 upon being disconnected. Telemetry notified of change. Will continue to monitor.   Fritz Pickerel, RN

## 2017-02-26 NOTE — Care Management Important Message (Signed)
Important Message  Patient Details  Name: Jennifer [name] MRN: [id number] Date of Birth: 02-06-1951   Medicare Important Message Given:  Yes    [name] 02/26/2017, 10:31 AM

## 2017-02-26 NOTE — Progress Notes (Addendum)
[address]       [place] [address]             [phone]      5 Days Post-Op Procedure(s) (LRB): CORONARY ARTERY BYPASS GRAFTING (CABG) times three. LIMA to LAD, SVG to OM and SVG to PDA (N/A) TRANSESOPHAGEAL ECHOCARDIOGRAM (TEE) (N/A) Subjective: Feels pretty well  Objective: Vital signs in last 24 hours: Temp:  [98 F (36.7 C)-98.6 F (37 C)] 98.1 F (36.7 C) (12/17 0332) Pulse Rate:  [85-90] 90 (12/17 0332) Cardiac Rhythm: Atrial paced (12/17 0700) Resp:  [13-32] 16 (12/17 0332) BP: (116-161)/(51-103) 161/71 (12/17 0332) SpO2:  [92 %-100 %] 100 % (12/17 0332) Weight:  [223 lb 1.6 oz (101.2 kg)] 223 lb 1.6 oz (101.2 kg) (12/17 0332)  Hemodynamic parameters for last 24 hours:    Intake/Output from previous day: 12/16 0701 - 12/17 0700 In: 630 [P.O.:600; I.V.:30] Out: 341 [Urine:340; Stool:1] Intake/Output this shift: No intake/output data recorded.  General appearance: alert, cooperative and no distress Heart: regular rate and rhythm Lungs: mildly dim in the bases Abdomen: benign Extremities: min edema Wound: incis healing well  Lab Results: Recent Labs    02/25/17 0404 02/26/17 0311  WBC 7.6 7.0  HGB 8.1* 7.9*  HCT 27.8* 27.0*  PLT 266 289   BMET:  Recent Labs    02/25/17 0404 02/26/17 0311  NA 139 140  K 4.3 3.9  CL 106 103  CO2 27 30  GLUCOSE 100* 99  BUN 17 14  CREATININE 0.90 0.88  CALCIUM 8.6* 8.9    PT/INR: No results for input(s): LABPROT, INR in the last 72 hours. ABG    Component Value Date/Time   PHART 7.334 (L) 02/21/2017 1829   HCO3 23.1 02/21/2017 1829   TCO2 24 02/22/2017 1611   ACIDBASEDEF 3.0 (H) 02/21/2017 1829   O2SAT 95.0 02/21/2017 1829   CBG (last 3)  Recent Labs    02/24/17 1703 02/24/17 2128 02/25/17 0732  GLUCAP 104* 122* 97    Meds Scheduled Meds: . acetaminophen  1,000 mg Oral Q6H   Or  . acetaminophen (TYLENOL) oral liquid 160 mg/5 mL  1,000 mg Per Tube Q6H  .  allopurinol  300 mg Oral Daily  . aspirin EC  81 mg Oral Daily  . atorvastatin  40 mg Oral Daily  . bisacodyl  10 mg Oral Daily   Or  . bisacodyl  10 mg Rectal Daily  . docusate sodium  200 mg Oral Daily  . DULoxetine  30 mg Oral Daily  . enoxaparin (LOVENOX) injection  30 mg Subcutaneous QHS  . gabapentin  300 mg Oral BID WC  . gabapentin  600 mg Oral QHS  . mouth rinse  15 mL Mouth Rinse BID  . metoCLOPramide (REGLAN) injection  10 mg Intravenous Q6H  . pantoprazole  40 mg Oral Daily  . sodium chloride flush  3 mL Intravenous Q12H   Continuous Infusions: . sodium chloride Stopped (02/22/17 1200)  . sodium chloride    . sodium chloride Stopped (02/21/17 1524)  . lactated ringers Stopped (02/25/17 1000)  . lactated ringers Stopped (02/22/17 0800)   PRN Meds:.sodium chloride, metoprolol tartrate, midazolam, ondansetron (ZOFRAN) IV, oxyCODONE, sodium chloride flush, traMADol  Xrays No results found.  Assessment/Plan: S/P Procedure(s) (LRB): CORONARY ARTERY BYPASS GRAFTING (CABG) times three. LIMA to LAD, SVG to OM and SVG to PDA (N/A) TRANSESOPHAGEAL ECHOCARDIOGRAM (TEE) (N/A)   1 doing well overall 2 sinus  in 70's under pacer- disconnect 3 BP up at times, will add ACE-I, low dose 4 labs all stable 5 cont to push rehab and pulm toilet  LOS: 13 days    John Giovanni 02/26/2017 Patient seen and examined, agree with above  Remo Lipps C. Roxan Hockey, MD [facility] [phone]

## 2017-02-27 MED ORDER — OXYCODONE HCL 5 MG PO TABS: 5.0000 mg | ORAL_TABLET | Freq: Four times a day (QID) | ORAL | 0 refills | Status: DC | PRN

## 2017-02-27 MED ORDER — ASPIRIN 81 MG PO TBEC: 81.0000 mg | DELAYED_RELEASE_TABLET | Freq: Every day | ORAL | Status: AC

## 2017-02-27 MED ORDER — ATORVASTATIN CALCIUM 40 MG PO TABS: 40.0000 mg | ORAL_TABLET | Freq: Every day | ORAL | 1 refills | Status: DC

## 2017-02-27 MED ORDER — METOPROLOL TARTRATE 25 MG PO TABS: 12.5000 mg | ORAL_TABLET | Freq: Two times a day (BID) | ORAL | 1 refills | Status: DC

## 2017-02-27 MED ORDER — LISINOPRIL 10 MG PO TABS
20.0000 mg | ORAL_TABLET | Freq: Every day | ORAL | Status: DC
Start: 2017-02-27 — End: 2017-02-27
  Administered 2017-02-27: 20 mg via ORAL
  Filled 2017-02-27: qty 2

## 2017-02-27 MED ORDER — LISINOPRIL 20 MG PO TABS: 20.0000 mg | ORAL_TABLET | Freq: Every day | ORAL | 1 refills | Status: DC

## 2017-02-27 NOTE — Progress Notes (Signed)
CARDIAC REHAB PHASE I   Pt walking independently with RW. She has RW at home. Ed completed with good comprehension. Will refer to [place]. [place], ACSM 02/27/2017 10:44 AM

## 2017-02-27 NOTE — Progress Notes (Addendum)
[address]       [place] [address]             [phone]      6 Days Post-Op Procedure(s) (LRB): CORONARY ARTERY BYPASS GRAFTING (CABG) times three. LIMA to LAD, SVG to OM and SVG to PDA (N/A) TRANSESOPHAGEAL ECHOCARDIOGRAM (TEE) (N/A) Subjective: " I'm fine as frog hair, split three ways, parted in the middle , with a bow on top and teased"  Objective: Vital signs in last 24 hours: Temp:  [98 F (36.7 C)-98.4 F (36.9 C)] 98.1 F (36.7 C) (12/18 0458) Pulse Rate:  [39-88] 84 (12/18 0458) Cardiac Rhythm: Normal sinus rhythm (12/18 0700) Resp:  [15-20] 15 (12/18 0458) BP: (136-151)/(51-57) 149/57 (12/18 0458) SpO2:  [94 %-100 %] 99 % (12/18 0458) Weight:  [227 lb 8.2 oz (103.2 kg)] 227 lb 8.2 oz (103.2 kg) (12/18 0458)  Hemodynamic parameters for last 24 hours:    Intake/Output from previous day: 12/17 0701 - 12/18 0700 In: 238 [P.O.:238] Out: 550 [Urine:550] Intake/Output this shift: No intake/output data recorded.  General appearance: alert, cooperative and no distress Heart: regular rate and rhythm Lungs: clear to auscultation bilaterally Abdomen: benign Extremities: min edema Wound: incis healing well  Lab Results: Recent Labs    02/25/17 0404 02/26/17 0311  WBC 7.6 7.0  HGB 8.1* 7.9*  HCT 27.8* 27.0*  PLT 266 289   BMET:  Recent Labs    02/25/17 0404 02/26/17 0311  NA 139 140  K 4.3 3.9  CL 106 103  CO2 27 30  GLUCOSE 100* 99  BUN 17 14  CREATININE 0.90 0.88  CALCIUM 8.6* 8.9    PT/INR: No results for input(s): LABPROT, INR in the last 72 hours. ABG    Component Value Date/Time   PHART 7.334 (L) 02/21/2017 1829   HCO3 23.1 02/21/2017 1829   TCO2 24 02/22/2017 1611   ACIDBASEDEF 3.0 (H) 02/21/2017 1829   O2SAT 95.0 02/21/2017 1829   CBG (last 3)  Recent Labs    02/24/17 1703 02/24/17 2128 02/25/17 0732  GLUCAP 104* 122* 97    Meds Scheduled Meds: . allopurinol  300 mg Oral Daily  . aspirin EC  81  mg Oral Daily  . atorvastatin  40 mg Oral Daily  . bisacodyl  10 mg Oral Daily   Or  . bisacodyl  10 mg Rectal Daily  . docusate sodium  200 mg Oral Daily  . DULoxetine  30 mg Oral Daily  . enoxaparin (LOVENOX) injection  30 mg Subcutaneous QHS  . gabapentin  300 mg Oral BID WC  . gabapentin  600 mg Oral QHS  . lisinopril  10 mg Oral Daily  . mouth rinse  15 mL Mouth Rinse BID  . metoCLOPramide (REGLAN) injection  10 mg Intravenous Q6H  . pantoprazole  40 mg Oral Daily  . sodium chloride flush  3 mL Intravenous Q12H   Continuous Infusions: . sodium chloride Stopped (02/22/17 1200)  . sodium chloride    . sodium chloride Stopped (02/21/17 1524)  . lactated ringers Stopped (02/25/17 1000)  . lactated ringers Stopped (02/22/17 0800)   PRN Meds:.sodium chloride, metoprolol tartrate, midazolam, ondansetron (ZOFRAN) IV, oxyCODONE, sodium chloride flush, traMADol  Xrays Dg Chest 2 View  Result Date: 02/26/2017 CLINICAL DATA:  Status post coronary artery bypass graft. EXAM: CHEST  2 VIEW COMPARISON:  Radiograph February 24, 2017. FINDINGS: Stable cardiomegaly. Atherosclerosis of thoracic aorta is noted. Sternotomy wires are  noted. No pneumothorax is noted. Right lung is clear. Stable left basilar atelectasis or infiltrate is noted with minimal left pleural effusion. Bony thorax is unremarkable. IMPRESSION: Aortic atherosclerosis. Stable left basilar atelectasis or infiltrate with minimal left pleural effusion. Electronically Signed   By: Marijo Conception, M.D.   On: 02/26/2017 09:08    Assessment/Plan: S/P Procedure(s) (LRB): CORONARY ARTERY BYPASS GRAFTING (CABG) times three. LIMA to LAD, SVG to OM and SVG to PDA (N/A) TRANSESOPHAGEAL ECHOCARDIOGRAM (TEE) (N/A)  1 afeb, VSS in SR- + HTN persists- will increase lisinopril dose. Sats good on RA 2 no new labs or CXR 3 d/c wires this am 4 poss home later today  LOS: 14 days    John Giovanni 02/27/2017 Patient seen and examined, agree  with above  Remo Lipps C. Roxan Hockey, MD [facility] [phone]

## 2017-02-27 NOTE — Consult Note (Signed)
   [facility] CM Inpatient Consult   02/27/2017  FLANNERY CAVALLERO 1950/12/03 [id number]  Patient in the [place].  Patient's primary care provider is Dr. Redmond School.  Will follow for EMMI general follow up calls.  Natividad Brood, RN BSN [facility] Liaison  [phone] business mobile phone Toll free office [phone]

## 2017-02-27 NOTE — Progress Notes (Signed)
EPWs pulled per protocol.  Pt tolerated well.  Will continue to monitor.

## 2017-02-27 NOTE — Care Management Note (Signed)
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- CVTS consulted- plan for CABG 12/13                  Action/Plan: PTA pt lived at home- independent- PCP- Redmond School,  CM will follow for transition needs post CABG  Expected Discharge Date:   02/27/17              Expected Discharge Plan:  Home/Self Care  In-House Referral:  [facility]  Discharge planning Services  CM Consult  Post Acute Care Choice:    Choice offered to:     DME Arranged:    DME Agency:     HH Arranged:   NA HH Agency:   NA  Status of Service:  Completed, signed off  If discussed at [place] of Stay Meetings, dates discussed:    Discharge Disposition: Home/self care   Additional Comments:  02/27/17- [place] RN, CM- pt for d/c home today- no CM needs noted for transition home.   Dawayne Patricia, RN 02/27/2017, 12:39 PM

## 2017-03-02 ENCOUNTER — Ambulatory Visit: Payer: Medicare Other | Admitting: Cardiovascular Disease

## 2017-03-02 ENCOUNTER — Ambulatory Visit (HOSPITAL_COMMUNITY)
Admission: RE | Admit: 2017-03-02 | Discharge: 2017-03-02 | Disposition: A | Discharge status: Home / Self Care | Payer: Medicare Other | Source: Ambulatory Visit | Attending: Family Medicine | Admitting: Family Medicine

## 2017-03-02 ENCOUNTER — Other Ambulatory Visit (HOSPITAL_COMMUNITY): Payer: Self-pay | Admitting: Family Medicine

## 2017-03-02 DIAGNOSIS — M79605 Pain in left leg: Secondary | ICD-10-CM

## 2017-03-02 DIAGNOSIS — I82812 Embolism and thrombosis of superficial veins of left lower extremities: Secondary | ICD-10-CM | POA: Diagnosis not present

## 2017-03-02 DIAGNOSIS — M79662 Pain in left lower leg: Secondary | ICD-10-CM | POA: Diagnosis not present

## 2017-03-02 DIAGNOSIS — I824Z2 Acute embolism and thrombosis of unspecified deep veins of left distal lower extremity: Secondary | ICD-10-CM | POA: Diagnosis not present

## 2017-03-02 DIAGNOSIS — I252 Old myocardial infarction: Secondary | ICD-10-CM | POA: Diagnosis not present

## 2017-03-02 DIAGNOSIS — E782 Mixed hyperlipidemia: Secondary | ICD-10-CM | POA: Diagnosis not present

## 2017-03-02 DIAGNOSIS — M79606 Pain in leg, unspecified: Secondary | ICD-10-CM | POA: Insufficient documentation

## 2017-03-02 DIAGNOSIS — I509 Heart failure, unspecified: Secondary | ICD-10-CM | POA: Diagnosis not present

## 2017-03-02 DIAGNOSIS — I10 Essential (primary) hypertension: Secondary | ICD-10-CM | POA: Diagnosis not present

## 2017-03-02 DIAGNOSIS — R739 Hyperglycemia, unspecified: Secondary | ICD-10-CM | POA: Diagnosis not present

## 2017-03-02 DIAGNOSIS — M7989 Other specified soft tissue disorders: Secondary | ICD-10-CM | POA: Diagnosis not present

## 2017-03-02 DIAGNOSIS — Z86718 Personal history of other venous thrombosis and embolism: Secondary | ICD-10-CM | POA: Diagnosis not present

## 2017-03-09 ENCOUNTER — Telehealth: Payer: Self-pay | Admitting: *Deleted

## 2017-03-09 DIAGNOSIS — E782 Mixed hyperlipidemia: Secondary | ICD-10-CM | POA: Diagnosis not present

## 2017-03-09 DIAGNOSIS — M545 Low back pain: Secondary | ICD-10-CM | POA: Diagnosis not present

## 2017-03-09 DIAGNOSIS — I10 Essential (primary) hypertension: Secondary | ICD-10-CM | POA: Diagnosis not present

## 2017-03-09 DIAGNOSIS — I82402 Acute embolism and thrombosis of unspecified deep veins of left lower extremity: Secondary | ICD-10-CM | POA: Diagnosis not present

## 2017-03-09 DIAGNOSIS — I214 Non-ST elevation (NSTEMI) myocardial infarction: Secondary | ICD-10-CM | POA: Diagnosis not present

## 2017-03-09 NOTE — Telephone Encounter (Signed)
This is a patient of Dr. Bronson Ing. I reviewed the chart, she just recently underwent CABG and was followed by cardiology during her hospital stay. Note from Dr. Percival Spanish on December 14 indicated ultimately planning to resume anticoagulation at discharge although it is not clear to me whether this was held off specifically by the surgical team or not in light of her apparent GI bleed. She has instead been on aspirin. I would suggest speaking with [facility] about whether anticoagulation was held specifically by them or not. Also please make sure that Jennifer [name] has a follow-up visit scheduled with Dr. Bronson Ing to help clarify this further.

## 2017-03-09 NOTE — Telephone Encounter (Signed)
Spoke with PCP Marisue Brooklyn PA-C) who states pt was recently hospitalized with CABG. Pt was noted to have GI bleed. Anticoagulates change at that time to ASA. 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 Pt has recently had superficial DVT. PCP would like further plans. Please advise.

## 2017-03-09 NOTE — Telephone Encounter (Signed)
Notified Sam Glennon Mac PA-C of suggestions and number to [facility]. She voiced understanding. Pt has appt with APP on 03/12/17 in our office.

## 2017-03-11 NOTE — Progress Notes (Signed)
Cardiology Office Note    Date:  03/12/2017   ID:  Jennifer [name], DOB 28-Apr-1950, MRN [id number]  PCP:  Redmond School, MD  Cardiologist: Dr. Bronson Ing  Chief Complaint  Patient presents with  . Hospitalization Follow-up    s/p CABG    History of Present Illness:    Jennifer [name] is a 66 y.o. female with past medical history of CAD (cath in 2016 showing moderate non-obstructive CAD with medical management recommended), history of PE/DVT (on Xarelto), HTN, and HLD who presents to the office today for hospital follow-up.   The patient was recently admitted to [facility] from 12/4 - 02/27/2017 for evaluation of evaluation of chest pain, found to have an NSTEMI with peak troponin of 11.36. Was also anemic with Hgb of 7.2 on admission (received multiple transfusions during admission). Xarelto was held on admission and GI was consulted for evaluation of her anemia prior to further cardiac testing being pursued. EGD and Colonoscopy showed no significant findings. Catheterization on 02/19/2017 showed significant multivessel coronary artery disease, including 60% ostial LMCA stenosis with catheter dampening, 70% dominant OM stenosis, and subacute versus chronic occlusion of the mid RCA, therefore CT Surgery consultation was recommended. She underwent CABG on 02/21/2017 with LIMA-LAD, SVG-OM, and SVG-PDA. She initially required pacing due to her baseline bradycardia but this improved throughout admission. She was ambulating with cardiac rehab over 300 ft prior to discharge. She was placed on ASA, statin, and BB therapy and ACE-I at the time of discharge with Xarelto not being resumed (Hgb remained low at 7.9 at the time of discharge).   She followed up with her PCP in the interim and repeat blood work was obtained on 03/09/2017 which showed Hgb of 11.6, platelets 568, and creatinine 1.03. A repeat lower extremity doppler was obtained due to her reporting pain along her left calf and this was  negative for DVT but did show a superficial venous thrombosis. She was therefore restarted on her Xarelto.   In talking with the patient today, she reports doing well since her recent surgery. Feels like her energy level has significantly improved since CABG. She is still experiencing mild sternal pain but denies any exertional chest pain or dyspnea on exertion. No recent orthopnea, PND, or lower extremity edema.   She denies any evidence of active bleeding. Resumed Xarelto and remains on ASA as outlined above. Plavix is listed as being on her current medication list but she reports having not taken this for several months.     Past Medical History:  Diagnosis Date  . Anxiety   . Arthritis   . CAD (coronary artery disease)    a. Cath 12/2014 - mild-moderate non-obstructive CAD with 60% mid RCA, 50% mid Cx-distal Cx, 30% distal LAD, 25% D2, EF 55-65%. b. 02/2017: NSTEMI with cath showing 60% LM stenosis and 100% RCA stenosis --> CT Surgery consulted with CABG on 02/21/2017 (LIMA-LAD, SVG-OM, and SVG-PDA)  . Depression   . Dizziness   . DVT (deep venous thrombosis) (HCC)    on Xarelto  . Dyspnea    W/ EXERTION  . GERD (gastroesophageal reflux disease)   . Headache   . Heart murmur    DX   AT BIRTH  . Hyperlipidemia   . Hypertension   . 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 Neuropathy   . PE (pulmonary embolism)   . Tobacco abuse     Past Surgical History:  Procedure Laterality Date  .  ABDOMINAL HYSTERECTOMY  1974  . CARDIAC CATHETERIZATION N/A 01/08/2015   Procedure: Left Heart Cath and Coronary Angiography;  Surgeon: Jolaine Artist, MD;  Location: [facility];  Service: Cardiovascular;  Laterality: N/A;  . CARPAL TUNNEL RELEASE     RIGHT  . COLONOSCOPY WITH PROPOFOL N/A 02/16/2017   Procedure: COLONOSCOPY WITH PROPOFOL;  Surgeon: [name], MD;  Location: [facility] ENDOSCOPY;  Service: Endoscopy;  Laterality: N/A;  . CORONARY ARTERY BYPASS GRAFT N/A  02/21/2017   Procedure: CORONARY ARTERY BYPASS GRAFTING (CABG) times three. LIMA to LAD, SVG to OM and SVG to PDA;  Surgeon: Melrose Nakayama, MD;  Location: [place];  Service: Open Heart Surgery;  Laterality: N/A;  . CORONARY STENT PLACEMENT Right 1980  . ESOPHAGOGASTRODUODENOSCOPY (EGD) WITH PROPOFOL N/A 02/16/2017   Procedure: ESOPHAGOGASTRODUODENOSCOPY (EGD) WITH PROPOFOL;  Surgeon: [name], MD;  Location: [facility] ENDOSCOPY;  Service: Endoscopy;  Laterality: N/A;  . KNEE ARTHROSCOPY W/ MENISCAL REPAIR     LEFT KNEE   FORMED DVT (SURG TO REMOVE BLOOD CLOT)  . LEFT HEART CATH AND CORONARY ANGIOGRAPHY N/A 02/19/2017   Procedure: LEFT HEART CATH AND CORONARY ANGIOGRAPHY;  Surgeon: Nelva Bush, MD;  Location: [place];  Service: Cardiovascular;  Laterality: N/A;  . TEE WITHOUT CARDIOVERSION N/A 02/21/2017   Procedure: TRANSESOPHAGEAL ECHOCARDIOGRAM (TEE);  Surgeon: Melrose Nakayama, MD;  Location: [place];  Service: Open Heart Surgery;  Laterality: N/A;  . TONSILLECTOMY      Current Medications: Outpatient Medications Prior to Visit  Medication Sig Dispense Refill  . allopurinol (ZYLOPRIM) 300 MG tablet Take 300 mg by mouth daily.    Marland Kitchen aspirin EC 81 MG EC tablet Take 1 tablet (81 mg total) by mouth daily.    . calcium carbonate (TUMS - DOSED IN MG ELEMENTAL CALCIUM) 500 MG chewable tablet Chew 6 tablets by mouth 3 (three) times daily with meals.    . Cholecalciferol (VITAMIN D) 2000 units CAPS Take 2,000 Units by mouth daily.    . DULoxetine (CYMBALTA) 30 MG capsule Take 30 mg by mouth daily.    Marland Kitchen gabapentin (NEURONTIN) 600 MG tablet Take 300-600 mg by mouth See admin instructions. Take 300 mg twice daily IN THE MORNING AND AT NOON AND  600 mg at bedtime.    Marland Kitchen oxyCODONE (OXY IR/ROXICODONE) 5 MG immediate release tablet Take 1-2 tablets (5-10 mg total) by mouth every 6 (six) hours as needed for severe pain. 30 tablet 0  . ranitidine (ZANTAC) 150 MG tablet Take 150 mg by  mouth daily as needed for heartburn.    . rivaroxaban (XARELTO) 20 MG TABS tablet Take 20 mg by mouth daily with supper.    Marland Kitchen atorvastatin (LIPITOR) 40 MG tablet Take 1 tablet (40 mg total) by mouth daily. 30 tablet 1  . clopidogrel (PLAVIX) 75 MG tablet Take 75 mg by mouth daily.    Marland Kitchen lisinopril (PRINIVIL,ZESTRIL) 20 MG tablet Take 1 tablet (20 mg total) by mouth daily. 30 tablet 1  . metoprolol tartrate (LOPRESSOR) 25 MG tablet Take 0.5 tablets (12.5 mg total) by mouth 2 (two) times daily. 30 tablet 1  . Menthol-Methyl Salicylate (MUSCLE RUB) 10-15 % CREA Apply 1 application topically 2 (two) times daily as needed for muscle pain.     No facility-administered medications prior to visit.      Allergies:   Bee venom   Social History   Socioeconomic History  . Marital status: Divorced    Spouse  name: None  . Number of children: 2  . Years of education: 16  . Highest education level: None  Social Needs  . Financial resource strain: None  . Food insecurity - worry: None  . Food insecurity - inability: None  . Transportation needs - medical: None  . Transportation needs - non-medical: None  Occupational History  . Occupation: Retired  Tobacco Use  . Smoking status: Current Every Day Smoker    Packs/day: 1.00    Years: 50.00    Pack years: 50.00    Types: Cigarettes    Start date: 06/18/1967  . Smokeless tobacco: Never Used  . Tobacco comment: Down to 1/2 pk per day.   Substance and Sexual Activity  . Alcohol use: No    Alcohol/week: 0.0 oz  . Drug use: No  . Sexual activity: None  Other Topics Concern  . None  Social History Narrative   Lives at home alone.   Right-handed.   2 cups caffeine per day.     Family History:  The patient's family history includes Dementia in her paternal grandmother; Heart attack in her brother, brother, brother, father, and mother; Heart disease in her maternal grandfather; Ovarian cancer in her maternal grandmother.   Review of Systems:     Please see the history of present illness.     General:  No chills, fever, night sweats or weight changes.  Cardiovascular:  No dyspnea on exertion, edema, orthopnea, palpitations, paroxysmal nocturnal dyspnea. Positive for sternal chest pain.  Dermatological: No rash, lesions/masses Respiratory: No cough, dyspnea Urologic: No hematuria, dysuria Abdominal:   No nausea, vomiting, diarrhea, bright red blood per rectum, melena, or hematemesis Neurologic:  No visual changes, wkns, changes in mental status. All other systems reviewed and are otherwise negative except as noted above.   Physical Exam:    VS:  BP 112/72   Pulse 85   Ht 5\' 5"  (1.651 m)   Wt 203 lb (92.1 kg)   SpO2 98%   BMI 33.78 kg/m    General: Well developed, well nourished Caucasian female appearing in no acute distress. Head: Normocephalic, atraumatic, sclera non-icteric, no xanthomas, nares are without discharge.  Neck: No carotid bruits. JVD not elevated.  Lungs: Respirations regular and unlabored, without wheezes or rales.  Heart: Regular rate and rhythm. No S3 or S4.  No murmur, no rubs, or gallops appreciated. Sternal scar appears well-healing with no drainage noted.  Abdomen: Soft, non-tender, non-distended with normoactive bowel sounds. No hepatomegaly. No rebound/guarding. No obvious abdominal masses. Msk:  Strength and tone appear normal for age. No joint deformities or effusions. Extremities: No clubbing or cyanosis. No lower extremity edema.  Distal pedal pulses are 2+ bilaterally. Vein graft harvest sites appear to be healing well with no drainage or significant erythema noted.  Neuro: Alert and oriented X 3. Moves all extremities spontaneously. No focal deficits noted. Psych:  Responds to questions appropriately with a normal affect. Skin: No rashes or lesions noted  Wt Readings from Last 3 Encounters:  03/12/17 203 lb (92.1 kg)  02/27/17 227 lb 8.2 oz (103.2 kg)  02/12/17 220 lb (99.8 kg)       Studies/Labs Reviewed:   EKG:  EKG is not ordered today.    Recent Labs: 06/20/2016: TSH 1.480 02/13/2017: ALT 16 02/22/2017: Magnesium 2.4 02/26/2017: BUN 14; Creatinine, Ser 0.88; Hemoglobin 7.9; Platelets 289; Potassium 3.9; Sodium 140   Lipid Panel    Component Value Date/Time   CHOL 129 02/14/2017 0841  TRIG 162 (H) 02/14/2017 0841   HDL 30 (L) 02/14/2017 0841   CHOLHDL 4.3 02/14/2017 0841   VLDL 32 02/14/2017 0841   LDLCALC 67 02/14/2017 0841    Additional studies/ records that were reviewed today include:   Cardiac Catheterization: 02/19/2017 Conclusions: 1. Significant multivessel coronary artery disease, including 60% ostial LMCA stenosis with catheter dampening, 70% dominant OM stenosis, and subacute versus chronic occlusion of the mid RCA with faint bridging and left-to-right collaterals. 2. Normal left ventricular contraction with mildly elevated filling pressure.  Recommendations: 1. Cardiac surgery consultation for CABG. 2. Continue aggressive secondary prevention, including aspirin and statin therapy. We will defer adding heparin at this time, given that infarct occurred more than 48 hours ago, the patient is asymptomatic, and she presented with severe iron deficiency anemia.   Echocardiogram: 02/14/2017 Study Conclusions  - Left ventricle: The cavity size was normal. Wall thickness was   increased in a pattern of mild LVH. Systolic function was normal.   The estimated ejection fraction was in the range of 60% to 65%.   Wall motion was normal; there were no regional wall motion   abnormalities. Features are consistent with a pseudonormal left   ventricular filling pattern, with concomitant abnormal relaxation   and increased filling pressure (grade 2 diastolic dysfunction). - Aortic valve: Valve area (VTI): 1.92 cm^2. Valve area (Vmax):   1.91 cm^2. Valve area (Vmean): 1.55 cm^2. - Mitral valve: There was mild regurgitation.  Assessment:    1.  Non-ST elevation myocardial infarction (NSTEMI), subsequent episode of care ([place])   2. Coronary artery disease of native artery of native heart with stable angina pectoris ([place])   3. 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 Essential hypertension   5. Hyperlipidemia LDL goal <70   6. Anemia, unspecified type      Plan:   In order of problems listed above:  1. CAD/ Subsequent Episode of Care for NSTEMI - she was recently admitted for an NSTEMI with peak troponin of 11.36. Cath showed multivessel CAD with 60% ostial LMCA stenosis. Underwent CABG on 02/21/2017 with LIMA-LAD, SVG-OM, and SVG-PDA.  - she has been progressing well and reports significant improvement in her energy level as compared to pre-CABG. She denies any exertional chest pain or dyspnea on exertion. Surgical sites appear to be healing well.  - will continue on ASA, BB, and statin therapy. Not on Plavix due to concurrent need for anticoagulation and in the setting of her recent anemia. Will place referral to Cardiac Rehab.   2. History of PE/DVT - initially diagnosed with a DVT in 2006 and has remained on chronic anticoagulation since. Recent lower extremity doppler was obtained due to her reporting pain along her left calf and this was negative for DVT but did show a superficial venous thrombosis.  - she has been restarted on Xarelto 20mg  daily.   3. HTN - BP is well-controlled at 112/72 during today's visit. - continue Lisinopril 20mg  daily and Lopressor 12.5mg  BID.   4. HLD - Lipid Panel in 02/2017 showed total cholesterol of 129, HDL 30, and LDL 67. At goal of LDL < 70. - continue Atorvastatin 40mg  daily. Will need repeat FLP/LFT's in 6-8 weeks.   5. Chronic Anemia - Hgb at 7.9 on 12/17, improved to 11.6 on 03/09/2017. - she denies any evidence of active bleeding.    Medication Adjustments/Labs and Tests Ordered: Current medicines are reviewed at length with the patient  today.  Concerns regarding medicines are outlined above.  Medication changes, Labs and Tests ordered today are listed in the Patient Instructions below. Patient Instructions  Medication Instructions:  Your physician recommends that you continue on your current medications as directed. Please refer to the Current Medication list given to you today.  Labwork: NONE   Testing/Procedures: NONE   Follow-Up: Your physician recommends that you schedule a follow-up appointment in: 3 Months with Dr. Domenic Polite  Any Other Special Instructions Will Be Listed Below (If Applicable).  If you need a refill on your cardiac medications before your next appointment, please call your pharmacy.  Thank you for choosing [facility]!    Signed, Erma Heritage, PA-C  03/12/2017 4:23 PM    [facility] [address], [place], [place]  [address] Phone: [phone]; Fax: [phone]  [address] [place], [place] [address] Phone: [phone]

## 2017-03-12 ENCOUNTER — Ambulatory Visit (INDEPENDENT_AMBULATORY_CARE_PROVIDER_SITE_OTHER): Payer: Medicare Other | Admitting: Student

## 2017-03-12 ENCOUNTER — Encounter: Payer: Self-pay | Admitting: Student

## 2017-03-12 VITALS — BP 112/72 | HR 85 | Ht 65.0 in | Wt 203.0 lb

## 2017-03-12 DIAGNOSIS — I214 Non-ST elevation (NSTEMI) myocardial infarction: Secondary | ICD-10-CM

## 2017-03-12 DIAGNOSIS — D649 Anemia, unspecified: Secondary | ICD-10-CM

## 2017-03-12 DIAGNOSIS — I82592 Chronic embolism and thrombosis of other specified deep vein of left lower extremity: Secondary | ICD-10-CM

## 2017-03-12 DIAGNOSIS — I25118 Atherosclerotic heart disease of native coronary artery with other forms of angina pectoris: Secondary | ICD-10-CM | POA: Diagnosis not present

## 2017-03-12 DIAGNOSIS — E785 Hyperlipidemia, unspecified: Secondary | ICD-10-CM | POA: Diagnosis not present

## 2017-03-12 DIAGNOSIS — I10 Essential (primary) hypertension: Secondary | ICD-10-CM

## 2017-03-12 MED ORDER — METOPROLOL TARTRATE 25 MG PO TABS: 12.5000 mg | ORAL_TABLET | Freq: Two times a day (BID) | ORAL | 3 refills | Status: DC

## 2017-03-12 MED ORDER — ATORVASTATIN CALCIUM 40 MG PO TABS: 40.0000 mg | ORAL_TABLET | Freq: Every day | ORAL | 3 refills | Status: DC

## 2017-03-12 MED ORDER — LISINOPRIL 20 MG PO TABS: 20.0000 mg | ORAL_TABLET | Freq: Every day | ORAL | 3 refills | Status: DC

## 2017-03-12 NOTE — Patient Instructions (Signed)
Medication Instructions:  Your physician recommends that you continue on your current medications as directed. Please refer to the Current Medication list given to you today.   Labwork: NONE  Testing/Procedures: NONE  Follow-Up: Your physician recommends that you schedule a follow-up appointment in: 3 Months with Dr. McDowell   Any Other Special Instructions Will Be Listed Below (If Applicable).     If you need a refill on your cardiac medications before your next appointment, please call your pharmacy. Thank you for choosing [facility]!    

## 2017-03-26 ENCOUNTER — Other Ambulatory Visit: Payer: Self-pay | Admitting: Thoracic Surgery (Cardiothoracic Vascular Surgery)

## 2017-03-26 DIAGNOSIS — Z951 Presence of aortocoronary bypass graft: Secondary | ICD-10-CM

## 2017-03-27 ENCOUNTER — Encounter: Payer: Self-pay | Admitting: Thoracic Surgery (Cardiothoracic Vascular Surgery)

## 2017-03-27 ENCOUNTER — Other Ambulatory Visit: Payer: Self-pay

## 2017-03-27 ENCOUNTER — Ambulatory Visit
Admission: RE | Admit: 2017-03-27 | Discharge: 2017-03-27 | Disposition: A | Discharge status: Home / Self Care | Payer: Medicare Other | Source: Ambulatory Visit | Attending: Thoracic Surgery (Cardiothoracic Vascular Surgery) | Admitting: Thoracic Surgery (Cardiothoracic Vascular Surgery)

## 2017-03-27 ENCOUNTER — Ambulatory Visit (INDEPENDENT_AMBULATORY_CARE_PROVIDER_SITE_OTHER): Payer: Self-pay | Admitting: Thoracic Surgery (Cardiothoracic Vascular Surgery)

## 2017-03-27 VITALS — BP 128/72 | HR 75 | Resp 18 | Ht 65.0 in | Wt 204.0 lb

## 2017-03-27 DIAGNOSIS — Z951 Presence of aortocoronary bypass graft: Secondary | ICD-10-CM

## 2017-03-27 DIAGNOSIS — I700 Atherosclerosis of aorta: Secondary | ICD-10-CM | POA: Diagnosis not present

## 2017-03-27 NOTE — Progress Notes (Signed)
[address]       [place] [address]             [phone]       HPI: Jennifer [name] returns for a scheduled postoperative follow-up visit  Jennifer [name] is a 67 year old woman with a past history significant for coronary disease, hypertension, hyperlipidemia, DVT, PE, and a gastrointestinal bleed.  She presented in December with a gastrointestinal bleed and anemia.  She also complained of chest pain she was found to have an elevated troponin.  She ruled in for a non-ST elevation MI.  At catheterization she had severe left main and three-vessel coronary disease.  I did coronary bypass grafting x3 on 02/21/2017.  Postoperatively she did well and went home on day 6.  She had some swelling in her left leg.  She had a duplex at Dr. Nolon Rod office.  It showed a superficial clot but no DVT.  She was started back on her Xarelto.  She has not had any problems since resuming that medication.  She feels well.  She is not taking any pain medication.  Discomfort when her shirt rubs over the incision.  She has not had any chest pain, pressure, or tightness.  Past Medical History:  Diagnosis Date  . Anxiety   . Arthritis   . CAD (coronary artery disease)    a. Cath 12/2014 - mild-moderate non-obstructive CAD with 60% mid RCA, 50% mid Cx-distal Cx, 30% distal LAD, 25% D2, EF 55-65%. b. 02/2017: NSTEMI with cath showing 60% LM stenosis and 100% RCA stenosis --> CT Surgery consulted with CABG on 02/21/2017 (LIMA-LAD, SVG-OM, and SVG-PDA)  . Depression   . Dizziness   . DVT (deep venous thrombosis) (HCC)    on Xarelto  . Dyspnea    W/ EXERTION  . GERD (gastroesophageal reflux disease)   . Headache   . Heart murmur    DX   AT BIRTH  . Hyperlipidemia   . Hypertension   . 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 Neuropathy   . PE (pulmonary embolism)   . Tobacco abuse     Current Outpatient Medications  Medication Sig Dispense Refill  . allopurinol (ZYLOPRIM) 300 MG  tablet Take 300 mg by mouth daily.    Marland Kitchen aspirin EC 81 MG EC tablet Take 1 tablet (81 mg total) by mouth daily.    Marland Kitchen atorvastatin (LIPITOR) 40 MG tablet Take 1 tablet (40 mg total) by mouth daily. 90 tablet 3  . calcium carbonate (TUMS - DOSED IN MG ELEMENTAL CALCIUM) 500 MG chewable tablet Chew 6 tablets by mouth 3 (three) times daily with meals.    . Cholecalciferol (VITAMIN D) 2000 units CAPS Take 2,000 Units by mouth daily.    . DULoxetine (CYMBALTA) 30 MG capsule Take 30 mg by mouth daily.    Marland Kitchen gabapentin (NEURONTIN) 600 MG tablet Take 300-600 mg by mouth See admin instructions. Take 300 mg twice daily IN THE MORNING AND AT NOON AND  600 mg at bedtime.    Marland Kitchen lisinopril (PRINIVIL,ZESTRIL) 20 MG tablet Take 1 tablet (20 mg total) by mouth daily. 90 tablet 3  . metoprolol tartrate (LOPRESSOR) 25 MG tablet Take 0.5 tablets (12.5 mg total) by mouth 2 (two) times daily. 90 tablet 3  . ranitidine (ZANTAC) 150 MG tablet Take 150 mg by mouth daily as needed for heartburn.    . rivaroxaban (XARELTO) 20 MG TABS tablet Take 20  mg by mouth daily with supper.     No current facility-administered medications for this visit.     Physical Exam BP 128/72 (BP Location: Left Arm, Patient Position: Sitting, Cuff Size: Large)   Pulse 75   Resp 18   Ht 5\' 5"  (1.651 m)   Wt 204 lb (92.5 kg)   SpO2 98% Comment: RA  BMI 33.89 kg/m  67 year old woman in no acute distress Alert and oriented x3 with no focal deficits Cardiac regular rate and rhythm normal S1-S2 Lungs clear with equal breath sounds bilaterally Sternal incision clean dry and intact sternum stable Leg incision healing well, no peripheral edema  Diagnostic Tests: CHEST  2 VIEW  COMPARISON:  02/26/2017  FINDINGS: Upper normal heart size post CABG.  Atherosclerotic calcification aorta.  Mediastinal contours and pulmonary vascularity normal.  Lungs clear.  No acute infiltrate, pleural effusion or pneumothorax.  Bones  unremarkable.  IMPRESSION: Post CABG.  No acute abnormalities.  Aortic atherosclerosis.   Electronically Signed   By: Lavonia Dana M.D.   On: 03/27/2017 11:55 I personally reviewed the chest x-ray images and concur with the findings noted above.  Impression: Jennifer [name] is a 68 year old woman who presented with chest pain in the setting of acute gastrointestinal bleed with anemia.  She ruled in for non-ST elevation MI.  At catheterization she had severe left main and two-vessel coronary disease.  She had coronary artery bypass grafting x3 about a month ago.  She is doing extremely well at this time.  At this point I think she is fine to begin driving.  She should limit herself to short trips around town and avoid high speeds in heavy traffic for the next few weeks.  She should not lift anything over 10 pounds for another 2 weeks.  Beyond that there are no restrictions on her activities.  History of DVT/PE-she is back on Xarelto  Gastrointestinal bleed-had a negative EGD.  No further symptoms of this since discharge.  Plan: Follow-up with [facility] cardiology in [place].  I will be happy to see her back anytime in the future if I can be of any further assistance with her care.  Melrose Nakayama, MD [facility] [phone]

## 2017-03-29 DIAGNOSIS — D500 Iron deficiency anemia secondary to blood loss (chronic): Secondary | ICD-10-CM | POA: Diagnosis not present

## 2017-04-03 ENCOUNTER — Other Ambulatory Visit: Payer: Self-pay

## 2017-04-03 ENCOUNTER — Emergency Department (HOSPITAL_COMMUNITY)
Admission: EM | Admit: 2017-04-03 | Discharge: 2017-04-03 | Disposition: A | Discharge status: Home / Self Care | Payer: Medicare Other | Attending: Emergency Medicine | Admitting: Emergency Medicine

## 2017-04-03 ENCOUNTER — Encounter (HOSPITAL_COMMUNITY): Payer: Self-pay | Admitting: Emergency Medicine

## 2017-04-03 DIAGNOSIS — Q639 Congenital malformation of kidney, unspecified: Secondary | ICD-10-CM | POA: Insufficient documentation

## 2017-04-03 DIAGNOSIS — I2510 Atherosclerotic heart disease of native coronary artery without angina pectoris: Secondary | ICD-10-CM | POA: Insufficient documentation

## 2017-04-03 DIAGNOSIS — I252 Old myocardial infarction: Secondary | ICD-10-CM | POA: Insufficient documentation

## 2017-04-03 DIAGNOSIS — D649 Anemia, unspecified: Secondary | ICD-10-CM | POA: Diagnosis not present

## 2017-04-03 DIAGNOSIS — I10 Essential (primary) hypertension: Secondary | ICD-10-CM | POA: Insufficient documentation

## 2017-04-03 DIAGNOSIS — Z951 Presence of aortocoronary bypass graft: Secondary | ICD-10-CM | POA: Diagnosis not present

## 2017-04-03 DIAGNOSIS — Z79899 Other long term (current) drug therapy: Secondary | ICD-10-CM | POA: Diagnosis not present

## 2017-04-03 DIAGNOSIS — F17210 Nicotine dependence, cigarettes, uncomplicated: Secondary | ICD-10-CM | POA: Diagnosis not present

## 2017-04-03 DIAGNOSIS — Z7901 Long term (current) use of anticoagulants: Secondary | ICD-10-CM | POA: Diagnosis not present

## 2017-04-03 DIAGNOSIS — Z7982 Long term (current) use of aspirin: Secondary | ICD-10-CM | POA: Insufficient documentation

## 2017-04-03 LAB — BASIC METABOLIC PANEL
Anion gap: 9 (ref 5–15)
BUN: 9 mg/dL (ref 6–20)
CO2: 29 mmol/L (ref 22–32)
Calcium: 8.8 mg/dL — ABNORMAL LOW (ref 8.9–10.3)
Chloride: 102 mmol/L (ref 101–111)
Creatinine, Ser: 0.88 mg/dL (ref 0.44–1.00)
GFR calc Af Amer: >60 mL/min (ref >60)
GFR calc non Af Amer: >60 mL/min (ref >60)
Glucose, Bld: 133 mg/dL — ABNORMAL HIGH (ref 65–99)
Potassium: 3.5 mmol/L (ref 3.5–5.1)
Sodium: 140 mmol/L (ref 135–145)

## 2017-04-03 LAB — CBC WITH DIFFERENTIAL/PLATELET
Basophils Absolute: 0 10*3/uL (ref 0.0–0.1)
Basophils Relative: 0 %
Eosinophils Absolute: 0.2 10*3/uL (ref 0.0–0.7)
Eosinophils Relative: 2 %
HCT: 33.1 % — ABNORMAL LOW (ref 36.0–46.0)
Hemoglobin: 9.4 g/dL — ABNORMAL LOW (ref 12.0–15.0)
Lymphocytes Relative: 24 %
Lymphs Abs: 1.6 10*3/uL (ref 0.7–4.0)
MCH: 23.8 pg — ABNORMAL LOW (ref 26.0–34.0)
MCHC: 28.4 g/dL — ABNORMAL LOW (ref 30.0–36.0)
MCV: 83.8 fL (ref 78.0–100.0)
Monocytes Absolute: 0.3 10*3/uL (ref 0.1–1.0)
Monocytes Relative: 4 %
Neutro Abs: 4.6 10*3/uL (ref 1.7–7.7)
Neutrophils Relative %: 70 %
Platelets: 337 10*3/uL (ref 150–400)
RBC: 3.95 MIL/uL (ref 3.87–5.11)
RDW: 19 % — ABNORMAL HIGH (ref 11.5–15.5)
WBC: 6.6 10*3/uL (ref 4.0–10.5)

## 2017-04-03 NOTE — Discharge Instructions (Signed)
Hemoglobin test today is 9.4, which shows improvement from 8.8 last week.  Typically the hemoglobin level goes up about 1 g each week, assuming you are eating a diet which is nutritious, containing iron.  Use the attached information to help guide your dietary choices.  Make sure that you are getting plenty of rest, drinking a lot of fluids and follow-up with your primary care doctor as needed for problems.

## 2017-04-03 NOTE — ED Triage Notes (Signed)
Patient states the doctors office sent her to have her hgb checked. Patient is status post CABG in Dec.

## 2017-04-03 NOTE — ED Provider Notes (Signed)
[facility] EMERGENCY DEPARTMENT Provider Note   CSN: [id number] Arrival date & time: 04/03/17  1123     History   Chief Complaint Chief Complaint  Patient presents with  . Anemia    HPI Jennifer [name] is a 67 y.o. female.  Patient is here for evaluation of anemia, hemoglobin testing done 6 days ago at primary care office, showed hemoglobin 8.8.  Last value available to them at that time was hemoglobin 11.2 on 02/22/17.  Therefore today when the value returned, she was instructed to come here.  The patient complains of mild tiredness with exertion.  Otherwise she is sleeping well, eating okay, and not having pain.  She had a complicated hospitalization, 02/13/17, initially with anemia, requiring transfusion, evaluated by endoscopy, subsequent cardiac catheterization, with significant lesions found requiring coronary artery bypass grafting, on 02/21/17.  She had an uncomplicated postoperative course and was discharged home.  She followed up with vascular surgeon, 3 weeks ago, and was released.  She is due to start cardiac rehab soon.  There are no other known modifying factors.  HPI  Past Medical History:  Diagnosis Date  . Anxiety   . Arthritis   . CAD (coronary artery disease)    a. Cath 12/2014 - mild-moderate non-obstructive CAD with 60% mid RCA, 50% mid Cx-distal Cx, 30% distal LAD, 25% D2, EF 55-65%. b. 02/2017: NSTEMI with cath showing 60% LM stenosis and 100% RCA stenosis --> CT Surgery consulted with CABG on 02/21/2017 (LIMA-LAD, SVG-OM, and SVG-PDA)  . Depression   . Dizziness   . DVT (deep venous thrombosis) (HCC)    on Xarelto  . Dyspnea    W/ EXERTION  . GERD (gastroesophageal reflux disease)   . Headache   . Heart murmur    DX   AT BIRTH  . Hyperlipidemia   . Hypertension   . 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 Neuropathy   . PE (pulmonary embolism)   . Tobacco abuse     Patient Active Problem List   Diagnosis Date Noted  . 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 Blood in stool   . Benign neoplasm of sigmoid colon   . Elevated troponin   . Symptomatic anemia 02/13/2017  . NSTEMI (non-ST elevated myocardial infarction) ([place]) 02/13/2017  . Symptomatic stenosis of right carotid artery without infarction 11/01/2016  . Syncope and collapse 08/10/2016  . Morbid obesity ([place]) 08/10/2016  . Low back pain 07/28/2016  . Gait abnormality 07/28/2016  . Carotid artery stenosis 07/28/2016  . Paresthesia 07/28/2016  . Dizziness 06/20/2016  . Numbness 06/20/2016  . CAD in native artery 01/08/2015  . Chest pain 01/06/2015  . Essential hypertension 01/06/2015  . Hyperlipidemia 01/06/2015  . History of pulmonary embolus (PE) 01/06/2015  . Leukocytosis 01/06/2015  . Tobacco use disorder 01/06/2015  . History of DVT (deep vein thrombosis) 01/06/2015    Past Surgical History:  Procedure Laterality Date  . ABDOMINAL HYSTERECTOMY  1974  . CARDIAC CATHETERIZATION N/A 01/08/2015   Procedure: Left Heart Cath and Coronary Angiography;  Surgeon: Jolaine Artist, MD;  Location: [facility] [facility];  Service: Cardiovascular;  Laterality: N/A;  . CARPAL TUNNEL RELEASE     RIGHT  . COLONOSCOPY WITH PROPOFOL N/A 02/16/2017   Procedure: COLONOSCOPY WITH PROPOFOL;  Surgeon: [name], MD;  Location: [facility] ENDOSCOPY;  Service: Endoscopy;  Laterality: N/A;  . CORONARY ARTERY BYPASS GRAFT N/A 02/21/2017   Procedure: CORONARY ARTERY BYPASS  GRAFTING (CABG) times three. LIMA to LAD, SVG to OM and SVG to PDA;  Surgeon: Melrose Nakayama, MD;  Location: [place];  Service: Open Heart Surgery;  Laterality: N/A;  . CORONARY STENT PLACEMENT Right 1980  . ESOPHAGOGASTRODUODENOSCOPY (EGD) WITH PROPOFOL N/A 02/16/2017   Procedure: ESOPHAGOGASTRODUODENOSCOPY (EGD) WITH PROPOFOL;  Surgeon: [name], MD;  Location: [facility] ENDOSCOPY;  Service: Endoscopy;  Laterality: N/A;  . KNEE ARTHROSCOPY W/ MENISCAL REPAIR     LEFT KNEE   FORMED DVT (SURG TO REMOVE BLOOD CLOT)    . LEFT HEART CATH AND CORONARY ANGIOGRAPHY N/A 02/19/2017   Procedure: LEFT HEART CATH AND CORONARY ANGIOGRAPHY;  Surgeon: Nelva Bush, MD;  Location: [place] [facility];  Service: Cardiovascular;  Laterality: N/A;  . TEE WITHOUT CARDIOVERSION N/A 02/21/2017   Procedure: TRANSESOPHAGEAL ECHOCARDIOGRAM (TEE);  Surgeon: Melrose Nakayama, MD;  Location: [place];  Service: Open Heart Surgery;  Laterality: N/A;  . TONSILLECTOMY      OB History    Gravida Para Term Preterm AB Living             2   SAB TAB Ectopic Multiple Live Births                   Home Medications    Prior to Admission medications   Medication Sig Start Date End Date Taking? Authorizing Provider  allopurinol (ZYLOPRIM) 300 MG tablet Take 300 mg by mouth daily. 12/19/16  Yes [provider]  aspirin EC 81 MG EC tablet Take 1 tablet (81 mg total) by mouth daily. 02/28/17  Yes Gold, Wayne E, PA-C  atorvastatin (LIPITOR) 40 MG tablet Take 1 tablet (40 mg total) by mouth daily. 03/12/17  Yes Strader, [place] M, PA-C  calcium carbonate (TUMS - DOSED IN MG ELEMENTAL CALCIUM) 500 MG chewable tablet Chew 2 tablets by mouth 3 (three) times daily with meals.    Yes [provider]  Cholecalciferol (VITAMIN D) 2000 units CAPS Take 2,000 Units by mouth daily.   Yes [provider]  DULoxetine (CYMBALTA) 30 MG capsule Take 30 mg by mouth daily.   Yes [provider]  gabapentin (NEURONTIN) 600 MG tablet Take 300-600 mg by mouth See admin instructions. Take 300 mg twice daily IN THE MORNING AND AT NOON AND  600 mg at bedtime.   Yes [provider]  lisinopril (PRINIVIL,ZESTRIL) 20 MG tablet Take 1 tablet (20 mg total) by mouth daily. 03/12/17  Yes Strader, [place], PA-C  metoprolol tartrate (LOPRESSOR) 25 MG tablet Take 0.5 tablets (12.5 mg total) by mouth 2 (two) times daily. 03/12/17  Yes Strader, [place] M, PA-C  Oxycodone HCl 10 MG TABS Take 10 mg by mouth 3 (three) times  daily as needed. For pain   Yes [provider]  ranitidine (ZANTAC) 150 MG tablet Take 150 mg by mouth daily as needed for heartburn.   Yes [provider]  rivaroxaban (XARELTO) 20 MG TABS tablet Take 20 mg by mouth daily with supper.   Yes [provider]    Family History Family History  Problem Relation Age of Onset  . Heart attack Mother   . Heart attack Father   . Heart attack Brother        CABG  . Heart attack Brother        CABG  . Heart attack Brother        CABG  . Ovarian cancer Maternal Grandmother   . Heart disease  Maternal Grandfather   . Dementia Paternal Grandmother     Social History Social History   Tobacco Use  . Smoking status: Current Every Day Smoker    Packs/day: 0.50    Years: 50.00    Pack years: 25.00    Types: Cigarettes    Start date: 06/18/1967  . Smokeless tobacco: Never Used  . Tobacco comment: smoked 3 cigarettes since sx  Substance Use Topics  . Alcohol use: No    Alcohol/week: 0.0 oz  . Drug use: No     Allergies   Bee venom   Review of Systems Review of Systems  All other systems reviewed and are negative.    Physical Exam Updated Vital Signs BP (!) 156/71 (BP Location: Right Arm)   Pulse 83   Temp 98.3 F (36.8 C) (Oral)   Resp 16   Ht 5\' 5"  (1.651 m)   Wt 91.6 kg (202 lb)   SpO2 99%   BMI 33.61 kg/m   Physical Exam  Constitutional: She is oriented to person, place, and time. She appears well-developed and well-nourished. No distress.  HENT:  Head: Normocephalic and atraumatic.  Eyes: Conjunctivae and EOM are normal. Pupils are equal, round, and reactive to light.  Neck: Normal range of motion and phonation normal. Neck supple.  Cardiovascular: Normal rate.  Pulmonary/Chest: Effort normal.  Musculoskeletal: Normal range of motion.  Neurological: She is alert and oriented to person, place, and time. She exhibits normal muscle tone.  Skin: Skin is warm and dry.  Psychiatric: She has  a normal mood and affect. Her behavior is normal. Judgment and thought content normal.  Nursing note and vitals reviewed.    ED Treatments / Results  Labs (all labs ordered are listed, but only abnormal results are displayed) Labs Reviewed  CBC WITH DIFFERENTIAL/PLATELET - Abnormal; Notable for the following components:      Result Value   Hemoglobin 9.4 (*)    HCT 33.1 (*)    MCH 23.8 (*)    MCHC 28.4 (*)    RDW 19.0 (*)    All other components within normal limits  BASIC METABOLIC PANEL - Abnormal; Notable for the following components:   Glucose, Bld 133 (*)    Calcium 8.8 (*)    All other components within normal limits   Hemoglobin  Date Value Ref Range Status  04/03/2017 9.4 (L) 12.0 - 15.0 g/dL Final  02/26/2017 7.9 (L) 12.0 - 15.0 g/dL Final  02/25/2017 8.1 (L) 12.0 - 15.0 g/dL Final  02/24/2017 8.0 (L) 12.0 - 15.0 g/dL Final    EKG  EKG Interpretation None       Radiology No results found.  Procedures Procedures (including critical care time)  Medications Ordered in ED Medications - No data to display   Initial Impression / Assessment and Plan / ED Course  I have reviewed the triage vital signs and the nursing notes.  Pertinent labs & imaging results that were available during my care of the patient were reviewed by me and considered in my medical decision making (see chart for details).  Clinical Course as of Apr 03 1646  Tue Apr 03, 2017  1643 Primary care service contacted, we discussed labs, and lack of apparent acute blood loss; in fact with improving trend of hemoglobin.  PCP will see in follow-up for management as needed.  [EW]    Clinical Course User Index [EW] Daleen Bo, MD     Patient Vitals for the past 24 hrs:  BP Temp Temp src Pulse Resp SpO2 Height Weight  04/03/17 1618 (!) 156/71 - - 83 16 99 % - -  04/03/17 1332 (!) 157/73 - Oral 85 18 100 % - -  04/03/17 1129 (!) 181/81 98.3 F (36.8 C) Oral 84 20 100 % 5\' 5"  (1.651 m)  91.6 kg (202 lb)    4:48 PM Reevaluation with update and discussion. After initial assessment and treatment, an updated evaluation reveals no change in clinical status.  Findings discussed with patient and all questions answered. Daleen Bo     Final Clinical Impressions(s) / ED Diagnoses   Final diagnoses:  Anemia, unspecified type   Anemia, uncomplicated, improving.  Trend since blood transfusion, and CABG, shows improvement, with normal MCV.  Patient does not appear to be iron deficient therefore does not need supplements.  No acute cardiac symptoms.  Vital signs are reassuring.  Nursing Notes Reviewed/ Care Coordinated Applicable Imaging Reviewed Interpretation of Laboratory Data incorporated into ED treatment  The patient appears reasonably screened and/or stabilized for discharge and I doubt any other medical condition or other [facility] requiring further screening, evaluation, or treatment in the ED at this time prior to discharge.  Plan: Home Medications-continue usual medications; Home Treatments-iron rich diet; return here if the recommended treatment, does not improve the symptoms; Recommended follow up-PCP, as needed   ED Discharge Orders    None       Daleen Bo, MD 04/03/17 1649

## 2017-04-10 ENCOUNTER — Telehealth: Payer: Self-pay | Admitting: Gastroenterology

## 2017-04-10 DIAGNOSIS — K921 Melena: Secondary | ICD-10-CM

## 2017-04-10 NOTE — Telephone Encounter (Signed)
Dr Ardis Hughs you saw this pt in December in the hospital for GI bleed (see below)  We received a referral for anemia Hgb 9.4 on 1/22 up from 7.9 on 02/26/17.  She was around 8-9.3 since 02/23/17.  Alyse Low put her on Amy's schedule for 04/19/17 is that ok or to far out?  She is being kept off of Xarelto until they find the cause of anemia.  Has a hx of DVT in the leg and recent superficial thrombosis in the left leg.  Please advise.     Notes recorded by [name], MD on 02/21/2017 at 8:17 AM EST  Please call her. The path from colon polyps show NO cancer. The larger polyp had some HGD, negative at the cautery margin. She needs recall colonoscopy in 3 years. thanks

## 2017-04-12 ENCOUNTER — Encounter (HOSPITAL_COMMUNITY)
Admission: RE | Admit: 2017-04-12 | Discharge: 2017-04-12 | Disposition: A | Discharge status: Home / Self Care | Payer: Medicare Other | Source: Ambulatory Visit | Attending: Cardiovascular Disease | Admitting: Cardiovascular Disease

## 2017-04-12 VITALS — BP 126/58 | HR 71 | Ht 64.0 in | Wt 207.4 lb

## 2017-04-12 DIAGNOSIS — K219 Gastro-esophageal reflux disease without esophagitis: Secondary | ICD-10-CM | POA: Diagnosis not present

## 2017-04-12 DIAGNOSIS — Z951 Presence of aortocoronary bypass graft: Secondary | ICD-10-CM | POA: Diagnosis not present

## 2017-04-12 DIAGNOSIS — I252 Old myocardial infarction: Secondary | ICD-10-CM | POA: Insufficient documentation

## 2017-04-12 DIAGNOSIS — I10 Essential (primary) hypertension: Secondary | ICD-10-CM | POA: Insufficient documentation

## 2017-04-12 DIAGNOSIS — Z86718 Personal history of other venous thrombosis and embolism: Secondary | ICD-10-CM | POA: Insufficient documentation

## 2017-04-12 DIAGNOSIS — E785 Hyperlipidemia, unspecified: Secondary | ICD-10-CM | POA: Diagnosis not present

## 2017-04-12 DIAGNOSIS — F329 Major depressive disorder, single episode, unspecified: Secondary | ICD-10-CM | POA: Insufficient documentation

## 2017-04-12 DIAGNOSIS — Z79899 Other long term (current) drug therapy: Secondary | ICD-10-CM | POA: Diagnosis not present

## 2017-04-12 DIAGNOSIS — Q600 Renal agenesis, unilateral: Secondary | ICD-10-CM | POA: Diagnosis not present

## 2017-04-12 DIAGNOSIS — F419 Anxiety disorder, unspecified: Secondary | ICD-10-CM | POA: Insufficient documentation

## 2017-04-12 DIAGNOSIS — I214 Non-ST elevation (NSTEMI) myocardial infarction: Secondary | ICD-10-CM

## 2017-04-12 DIAGNOSIS — Z7901 Long term (current) use of anticoagulants: Secondary | ICD-10-CM | POA: Insufficient documentation

## 2017-04-12 DIAGNOSIS — F17210 Nicotine dependence, cigarettes, uncomplicated: Secondary | ICD-10-CM | POA: Diagnosis not present

## 2017-04-12 DIAGNOSIS — G629 Polyneuropathy, unspecified: Secondary | ICD-10-CM | POA: Insufficient documentation

## 2017-04-12 DIAGNOSIS — Z7982 Long term (current) use of aspirin: Secondary | ICD-10-CM | POA: Diagnosis not present

## 2017-04-12 DIAGNOSIS — I2510 Atherosclerotic heart disease of native coronary artery without angina pectoris: Secondary | ICD-10-CM | POA: Insufficient documentation

## 2017-04-12 DIAGNOSIS — M1990 Unspecified osteoarthritis, unspecified site: Secondary | ICD-10-CM | POA: Insufficient documentation

## 2017-04-12 DIAGNOSIS — Z86711 Personal history of pulmonary embolism: Secondary | ICD-10-CM | POA: Insufficient documentation

## 2017-04-12 NOTE — Telephone Encounter (Signed)
Left message on machine to call back  

## 2017-04-12 NOTE — Progress Notes (Signed)
Cardiac/Pulmonary Rehab Medication Review by a Pharmacist  Does the patient  feel that his/her medications are working for him/her?  yes  Has the patient been experiencing any side effects to the medications prescribed?  no  Does the patient measure his/her own blood pressure or blood glucose at home?  yes   Does the patient have any problems obtaining medications due to transportation or finances?   no  Understanding of regimen: excellent Understanding of indications: good Potential of compliance: excellent  Questions asked to Determine Patient Understanding of Medication Regimen:  1. What is the name of the medication?  2. What is the medication used for?  3. When should it be taken?  4. How much should be taken?  5. How will you take it?  6. What side effects should you report?  Understanding Defined as: Excellent: All questions above are correct Good: Questions 1-4 are correct Fair: Questions 1-2 are correct  Poor: 1 or none of the above questions are correct   Pharmacist comments: Patient has an overall good understanding of her regiment and indications. Has a high probability of compliance. Counseled patient about her Tylenol occasional use. Says she sometimes takes 2000 mg at a time for headaches. Explained that is over max recommended dose and recommended no more than 1000 mg at a time/3000 mg in a 24 hours period. She will discuss with her physician what she can do about headaches since doesn't want her taking ibuprofen due to xarelto.    Ramond Craver 04/12/2017 10:08 AM

## 2017-04-12 NOTE — Progress Notes (Signed)
Cardiac Individual Treatment Plan  Patient Details  Name: Jennifer [name] MRN: [id number] Date of Birth: [birth year] Referring Provider:     [facility] from 04/12/2017 in [facility]  Referring Provider  Dr. Bronson Ing      Initial Encounter Date:    CARDIAC REHAB PHASE II ORIENTATION from 04/12/2017 in [place]  Date  04/12/17  Referring Provider  Dr. Bronson Ing      Visit Diagnosis: NSTEMI (non-ST elevated myocardial infarction) ([place])  S/P CABG x 3  Patient's Home Medications on Admission:  Current Outpatient Medications:  .  allopurinol (ZYLOPRIM) 300 MG tablet, Take 300 mg by mouth daily., Disp: , Rfl:  .  aspirin EC 81 MG EC tablet, Take 1 tablet (81 mg total) by mouth daily., Disp: , Rfl:  .  atorvastatin (LIPITOR) 40 MG tablet, Take 1 tablet (40 mg total) by mouth daily., Disp: 90 tablet, Rfl: 3 .  calcium carbonate (TUMS - DOSED IN MG ELEMENTAL CALCIUM) 500 MG chewable tablet, Chew 2 tablets by mouth 3 (three) times daily with meals. , Disp: , Rfl:  .  Cholecalciferol (VITAMIN D) 2000 units CAPS, Take 2,000 Units by mouth daily., Disp: , Rfl:  .  DULoxetine (CYMBALTA) 30 MG capsule, Take 30 mg by mouth daily., Disp: , Rfl:  .  gabapentin (NEURONTIN) 600 MG tablet, Take 300-600 mg by mouth See admin instructions. Take 300 mg twice daily IN THE MORNING AND AT NOON AND  600 mg at bedtime., Disp: , Rfl:  .  lisinopril (PRINIVIL,ZESTRIL) 20 MG tablet, Take 1 tablet (20 mg total) by mouth daily., Disp: 90 tablet, Rfl: 3 .  metoprolol tartrate (LOPRESSOR) 25 MG tablet, Take 0.5 tablets (12.5 mg total) by mouth 2 (two) times daily., Disp: 90 tablet, Rfl: 3 .  Oxycodone HCl 10 MG TABS, Take 10 mg by mouth 3 (three) times daily as needed. For pain, Disp: , Rfl:  .  ranitidine (ZANTAC) 150 MG tablet, Take 150 mg by mouth daily as needed for heartburn., Disp: , Rfl:  .  rivaroxaban (XARELTO) 20 MG TABS tablet, Take 20 mg  by mouth daily with supper., Disp: , Rfl:   Past Medical History: Past Medical History:  Diagnosis Date  . Anxiety   . Arthritis   . CAD (coronary artery disease)    a. Cath 12/2014 - mild-moderate non-obstructive CAD with 60% mid RCA, 50% mid Cx-distal Cx, 30% distal LAD, 25% D2, EF 55-65%. b. 02/2017: NSTEMI with cath showing 60% LM stenosis and 100% RCA stenosis --> CT Surgery consulted with CABG on 02/21/2017 (LIMA-LAD, SVG-OM, and SVG-PDA)  . Depression   . Dizziness   . DVT (deep venous thrombosis) ([facility])    on Xarelto  . Dyspnea    W/ EXERTION  . GERD (gastroesophageal reflux disease)   . Headache   . Heart murmur    DX   AT BIRTH  . Hyperlipidemia   . Hypertension   . 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 Neuropathy   . PE (pulmonary embolism)   . Tobacco abuse     Tobacco Use: Social History   Tobacco Use  Smoking Status Current Every Day Smoker  . Packs/day: 0.50  . Years: 50.00  . Pack years: 25.00  . Types: Cigarettes  . Start date: 06/18/1967  Smokeless Tobacco Never Used  Tobacco Comment   smoked 3 cigarettes since sx    Labs: Recent Review Flowsheet Data  Labs for ITP Cardiac and Pulmonary Rehab Latest Ref Rng & Units 02/21/2017 02/21/2017 02/21/2017 02/21/2017 02/22/2017   Cholestrol 0 - 200 mg/dL - - - - -   LDLCALC 0 - 99 mg/dL - - - - -   HDL >40 mg/dL - - - - -   Trlycerides <150 mg/dL - - - - -   Hemoglobin A1c 4.8 - 5.6 % - - - - -   PHART 7.350 - 7.450 7.379 7.334(L) - - -   PCO2ART 32.0 - 48.0 mmHg 45.8 43.4 - - -   HCO3 20.0 - 28.0 mmol/L 27.1 23.1 - - -   TCO2 22 - 32 mmol/L 28 24 28 26 24    ACIDBASEDEF 0.0 - 2.0 mmol/L - 3.0(H) - - -   O2SAT % 97.0 95.0 - - -      Capillary Blood Glucose: Lab Results  Component Value Date   GLUCAP 97 02/25/2017   GLUCAP 122 (H) 02/24/2017   GLUCAP 104 (H) 02/24/2017   GLUCAP 92 02/24/2017   GLUCAP 129 (H) 02/24/2017     Exercise Target Goals: Date: 04/12/17  Exercise Program  Goal: Individual exercise prescription set using results from initial 6 min walk test and THRR while considering  patient's activity barriers and safety.   Exercise Prescription Goal: Starting with aerobic activity 30 plus minutes a day, 3 days per week for initial exercise prescription. Provide home exercise prescription and guidelines that participant acknowledges understanding prior to discharge.  Activity Barriers & Risk Stratification: Activity Barriers & Cardiac Risk Stratification - 04/12/17 0954      Activity Barriers & Cardiac Risk Stratification   Activity Barriers  Deconditioning;Balance Concerns    Cardiac Risk Stratification  High       6 Minute Walk: 6 Minute Walk    Row Name 04/12/17 0952         6 Minute Walk   Phase  Initial     Distance  1200 feet     Distance % Change  0 %     Distance Feet Change  0 ft     Walk Time  6 minutes     # of Rest Breaks  0     MPH  2.27     METS  2.74     RPE  13     Perceived Dyspnea   13     VO2 Peak  9.39     Symptoms  No     Resting HR  71 bpm     Resting BP  126/58     Resting Oxygen Saturation   96 %     Exercise Oxygen Saturation  during 6 min walk  93 %     Max Ex. HR  117 bpm     Max Ex. BP  144/62     2 Minute Post BP  132/58        Oxygen Initial Assessment:   Oxygen Re-Evaluation:   Oxygen Discharge (Final Oxygen Re-Evaluation):   Initial Exercise Prescription: Initial Exercise Prescription - 04/12/17 0900      Date of Initial Exercise RX and Referring Provider   Date  04/12/17    Referring Provider  Dr. Bronson Ing      Treadmill   MPH  1    Grade  0    Minutes  15    METs  1.7      NuStep   Level  2    SPM  55  Minutes  20    METs  1.5      Prescription Details   Frequency (times per week)  3    Duration  Progress to 30 minutes of continuous aerobic without signs/symptoms of physical distress      Intensity   THRR 40-80% of Max Heartrate  104-120-137    Ratings of Perceived  Exertion  11-13    Perceived Dyspnea  0-4      Progression   Progression  Continue progressive overload as per policy without signs/symptoms or physical distress.      Resistance Training   Training Prescription  Yes    Weight  1    Reps  10-15       Perform Capillary Blood Glucose checks as needed.  Exercise Prescription Changes:   Exercise Comments:   Exercise Goals and Review:  Exercise Goals    Row Name 04/12/17 0955             Exercise Goals   Increase Physical Activity  Yes       Intervention  Provide advice, education, support and counseling about physical activity/exercise needs.;Develop an individualized exercise prescription for aerobic and resistive training based on initial evaluation findings, risk stratification, comorbidities and participant's personal goals.       Expected Outcomes  Short Term: Attend rehab on a regular basis to increase amount of physical activity.       Increase Strength and Stamina  Yes       Intervention  Provide advice, education, support and counseling about physical activity/exercise needs.;Develop an individualized exercise prescription for aerobic and resistive training based on initial evaluation findings, risk stratification, comorbidities and participant's personal goals.       Expected Outcomes  Short Term: Increase workloads from initial exercise prescription for resistance, speed, and METs.       Able to understand and use rate of perceived exertion (RPE) scale  Yes       Intervention  Provide education and explanation on how to use RPE scale       Expected Outcomes  Short Term: Able to use RPE daily in rehab to express subjective intensity level;Long Term:  Able to use RPE to guide intensity level when exercising independently       Able to understand and use Dyspnea scale  Yes       Intervention  Provide education and explanation on how to use Dyspnea scale       Expected Outcomes  Long Term: Able to use Dyspnea scale to  guide intensity level when exercising independently;Short Term: Able to use Dyspnea scale daily in rehab to express subjective sense of shortness of breath during exertion       Knowledge and understanding of Target Heart Rate Range (THRR)  Yes       Intervention  Provide education and explanation of THRR including how the numbers were predicted and where they are located for reference       Expected Outcomes  Short Term: Able to state/look up THRR       Able to check pulse independently  Yes       Intervention  Provide education and demonstration on how to check pulse in carotid and radial arteries.;Review the importance of being able to check your own pulse for safety during independent exercise       Expected Outcomes  Short Term: Able to explain why pulse checking is important during independent exercise;Long Term: Able to check pulse independently and  accurately       Understanding of Exercise Prescription  Yes       Intervention  Provide education, explanation, and written materials on patient's individual exercise prescription       Expected Outcomes  Short Term: Able to explain program exercise prescription;Long Term: Able to explain home exercise prescription to exercise independently          Exercise Goals Re-Evaluation :    Discharge Exercise Prescription (Final Exercise Prescription Changes):   Nutrition:  Target Goals: Understanding of nutrition guidelines, daily intake of sodium 1500mg , cholesterol 200mg , calories 30% from fat and 7% or less from saturated fats, daily to have 5 or more servings of fruits and vegetables.  Biometrics: Pre Biometrics - 04/12/17 0955      Pre Biometrics   Height  5\' 4"  (1.626 m)    Weight  207 lb 6.4 oz (94.1 kg)    Waist Circumference  42.75 inches    Hip Circumference  47 inches    Waist to Hip Ratio  0.91 %    BMI (Calculated)  35.58    Triceps Skinfold  17 mm    % Body Fat  43.2 %    Grip Strength  56.4 kg    Flexibility  0 in LBP     Single Leg Stand  16 seconds        Nutrition Therapy Plan and Nutrition Goals: Nutrition Therapy & Goals - 04/12/17 1010      Personal Nutrition Goals   Personal Goal #2  Patient is following a heart healthy diet.    Additional Goals?  No       Nutrition Assessments: Nutrition Assessments - 04/12/17 1011      MEDFICTS Scores   Pre Score  18       Nutrition Goals Re-Evaluation:   Nutrition Goals Discharge (Final Nutrition Goals Re-Evaluation):   Psychosocial: Target Goals: Acknowledge presence or absence of significant depression and/or stress, maximize coping skills, provide positive support system. Participant is able to verbalize types and ability to use techniques and skills needed for reducing stress and depression.  Initial Review & Psychosocial Screening: Initial Psych Review & Screening - 04/12/17 1015      Initial Review   Current issues with  History of Depression      Family Dynamics   Good Support System?  No    Concerns  Inappropriate over/under dependence on family/friends;No support system See feels her friends are supportive, her family is not.       Barriers   Psychosocial barriers to participate in program  Psychosocial barriers identified (see note)      Screening Interventions   Interventions  Encouraged to exercise;Provide feedback about the scores to participant    Expected Outcomes  Short Term goal: Identification and review with participant of any Quality of Life or Depression concerns found by scoring the questionnaire.;Long Term goal: The participant improves quality of Life and PHQ9 Scores as seen by post scores and/or verbalization of changes       Quality of Life Scores: Quality of Life - 04/12/17 0956      Quality of Life Scores   Health/Function Pre  21.5 %    Socioeconomic Pre  24 %    Psych/Spiritual Pre  19.86 %    Family Pre  6 %    GLOBAL Pre  19.77 %      Scores of 19 and below usually indicate a poorer quality of  life in these  areas.  A difference of  2-3 points is a clinically meaningful difference.  A difference of 2-3 points in the total score of the Quality of Life Index has been associated with significant improvement in overall quality of life, self-image, physical symptoms, and general health in studies assessing change in quality of life.  PHQ-9: Recent Review Flowsheet Data    Depression screen [facility] 2/9 04/12/2017   Decreased Interest 0   Down, Depressed, Hopeless 0   PHQ - 2 Score 0   Altered sleeping 1   Tired, decreased energy 1   Change in appetite 1   Feeling bad or failure about yourself  0   Trouble concentrating 0   Moving slowly or fidgety/restless 0   Suicidal thoughts 0   PHQ-9 Score 3   Difficult doing work/chores Not difficult at all     Interpretation of Total Score  Total Score Depression Severity:  1-4 = Minimal depression, 5-9 = Mild depression, 10-14 = Moderate depression, 15-19 = Moderately severe depression, 20-27 = Severe depression   Psychosocial Evaluation and Intervention: Psychosocial Evaluation - 04/12/17 1019      Psychosocial Evaluation & Interventions   Interventions  Encouraged to exercise with the program and follow exercise prescription    Continue Psychosocial Services   Follow up required by staff       Psychosocial Re-Evaluation:   Psychosocial Discharge (Final Psychosocial Re-Evaluation):   Vocational Rehabilitation: Provide vocational rehab assistance to qualifying candidates.   Vocational Rehab Evaluation & Intervention: Vocational Rehab - 04/12/17 1006      Initial Vocational Rehab Evaluation & Intervention   Assessment shows need for Vocational Rehabilitation  No       Education: Education Goals: Education classes will be provided on a weekly basis, covering required topics. Participant will state understanding/return demonstration of topics presented.  Learning Barriers/Preferences: Learning Barriers/Preferences - 04/12/17  0954      Learning Barriers/Preferences   Learning Barriers  None    Learning Preferences  Pictoral;Skilled Demonstration;Video;Written Material;Individual Instruction;Group Instruction;Computer/Internet       Education Topics: Hypertension, Hypertension Reduction -Define heart disease and high blood pressure. Discus how high blood pressure affects the body and ways to reduce high blood pressure.   Exercise and Your Heart -Discuss why it is important to exercise, the FITT principles of exercise, normal and abnormal responses to exercise, and how to exercise safely.   Angina -Discuss definition of angina, causes of angina, treatment of angina, and how to decrease risk of having angina.   Cardiac Medications -Review what the following cardiac medications are used for, how they affect the body, and side effects that may occur when taking the medications.  Medications include Aspirin, Beta blockers, calcium channel blockers, ACE Inhibitors, angiotensin receptor blockers, diuretics, digoxin, and antihyperlipidemics.   Congestive Heart Failure -Discuss the definition of CHF, how to live with CHF, the signs and symptoms of CHF, and how keep track of weight and sodium intake.   Heart Disease and Intimacy -Discus the effect sexual activity has on the heart, how changes occur during intimacy as we age, and safety during sexual activity.   Smoking Cessation / COPD -Discuss different methods to quit smoking, the health benefits of quitting smoking, and the definition of COPD.   Nutrition I: Fats -Discuss the types of cholesterol, what cholesterol does to the heart, and how cholesterol levels can be controlled.   Nutrition II: Labels -Discuss the different components of food labels and how to read food label  Heart Parts/Heart Disease and PAD -Discuss the anatomy of the heart, the pathway of blood circulation through the heart, and these are affected by heart disease.   Stress I:  Signs and Symptoms -Discuss the causes of stress, how stress may lead to anxiety and depression, and ways to limit stress.   Stress II: Relaxation -Discuss different types of relaxation techniques to limit stress.   Warning Signs of Stroke / TIA -Discuss definition of a stroke, what the signs and symptoms are of a stroke, and how to identify when someone is having stroke.   Knowledge Questionnaire Score: Knowledge Questionnaire Score - 04/12/17 0954      Knowledge Questionnaire Score   Pre Score  26/28       Core Components/Risk Factors/Patient Goals at Admission: Personal Goals and Risk Factors at Admission - 04/12/17 1011      Core Components/Risk Factors/Patient Goals on Admission    Weight Management  Yes    Admit Weight  207 lb (93.9 kg)    Goal Weight: Short Term  202 lb (91.6 kg)    Goal Weight: Long Term  192 lb (87.1 kg)    Expected Outcomes  Short Term: Continue to assess and modify interventions until short term weight is achieved;Long Term: Adherence to nutrition and physical activity/exercise program aimed toward attainment of established weight goal    Tobacco Cessation  Yes    Intervention  Assist the participant in steps to quit. Provide individualized education and counseling about committing to Tobacco Cessation, relapse prevention, and pharmacological support that can be provided by physician.;Advice worker, assist with locating and accessing local/national Quit Smoking programs, and support quit date choice.    Personal Goal Other  Yes    Personal Goal  Learn how to exercise at a pace for heart benefits. Get stonger, Lose 27lbs, and live a healthier life.     Intervention  Attend 3 x week and supplement with at home exercise 2 x week. Also attend smoking cessation classes offered at [facility].     Expected Outcomes  Reach goals       Core Components/Risk Factors/Patient Goals Review:    Core Components/Risk Factors/Patient Goals at Discharge  (Final Review):    ITP Comments: ITP Comments    Row Name 04/12/17 1006           ITP Comments  Ms. Ripoll is a plesant 20 yr. old female reovering from CABGx3. She is still smoking cigaretts 1/2 ppd. Smoking Cessation schedule was given to patient.           Comments: Patient arrived for 1st visit/orientation/education at 0800. Patient was referred to CR by Dr. Bronson Ing due to NSTEMI (I21.4) and CABGx3 (Z95.5). During orientation advised patient on arrival and appointment times what to wear, what to do before, during and after exercise. Reviewed attendance and class policy. Talked about inclement weather and class consultation policy. Pt is scheduled to return Cardiac Rehab on 04/16/17 at 11:00. Pt was advised to come to class 15 minutes before class starts. Patient was also given instructions on meeting with the dietician and attending the Family Structure classes. Discussed RPE/Dpysnea scales. Discussed initial THR and how to find their radial and/or carotid pulse. Discussed the initial exercise prescription and how this effects their progress. Pt is eager to get started. Patient participated in warm up stretches followed by light weights and resistance bands. Patient was able to complete 6 minute walk test. Patient c/o pain in lower back at 4/10. Pain  subsided to 2/10 at 2 minute rest. Pain was completely gone at the end of orientation. Patient was measured for the equipment. Discussed equipment safety with patient. Took patient pre-anthropometric measurements. Patient finished visit at 10:30.

## 2017-04-12 NOTE — Telephone Encounter (Signed)
That appointment date is good.  She can resume her xarelto now.  She needs cbc 1-2 days prior to her 2/7 appt.  Thanks

## 2017-04-12 NOTE — Progress Notes (Signed)
Daily Session Note  Patient Details  Name: Jennifer [name] MRN: [id number] Date of Birth: 12-08-1950 Referring Provider:     [place] from 04/12/2017 in [place]  Referring Provider  Dr. Bronson Ing      Encounter Date: 04/12/2017  Check In: Session Check In - 04/12/17 0800      Check-In   Location  [facility]    Staff Present  Chrislynn Mosely Angelina Pih, MS, EP, [facility], Exercise Physiologist;Gregory Luther Parody, BS, EP, Exercise Physiologist;Debra Wynetta Emery, RN, BSN    Supervising physician immediately available to respond to emergencies  See telemetry face sheet for immediately available MD    Medication changes reported      No    Fall or balance concerns reported     Yes    Comments  Has fallen twice    Tobacco Cessation  Use Decreased Smoking 1/2 ppd    Warm-up and Cool-down  Performed as group-led instruction    Resistance Training Performed  Yes    VAD Patient?  No      Pain Assessment   Currently in Pain?  No/denies    Pain Score  0-No pain    Multiple Pain Sites  No       Capillary Blood Glucose: No results found for this or any previous visit (from the past 24 hour(s)).    Social History   Tobacco Use  Smoking Status Current Every Day Smoker  . Packs/day: 0.50  . Years: 50.00  . Pack years: 25.00  . Types: Cigarettes  . Start date: 06/18/1967  Smokeless Tobacco Never Used  Tobacco Comment   smoked 3 cigarettes since sx    Goals Met:  Independence with exercise equipment Exercise tolerated well No report of cardiac concerns or symptoms Strength training completed today  Goals Unmet:  Not Applicable  Comments: Check out 10:30   Dr. Kate Sable is Medical Director for [place] and Pulmonary Rehab.

## 2017-04-16 ENCOUNTER — Telehealth: Payer: Self-pay

## 2017-04-16 ENCOUNTER — Encounter (HOSPITAL_COMMUNITY)
Admission: RE | Admit: 2017-04-16 | Discharge: 2017-04-16 | Disposition: A | Discharge status: Home / Self Care | Payer: Medicare Other | Source: Ambulatory Visit | Attending: Cardiovascular Disease | Admitting: Cardiovascular Disease

## 2017-04-16 DIAGNOSIS — Z951 Presence of aortocoronary bypass graft: Secondary | ICD-10-CM | POA: Diagnosis not present

## 2017-04-16 DIAGNOSIS — M1990 Unspecified osteoarthritis, unspecified site: Secondary | ICD-10-CM | POA: Diagnosis not present

## 2017-04-16 DIAGNOSIS — I2510 Atherosclerotic heart disease of native coronary artery without angina pectoris: Secondary | ICD-10-CM | POA: Insufficient documentation

## 2017-04-16 DIAGNOSIS — I252 Old myocardial infarction: Secondary | ICD-10-CM | POA: Diagnosis not present

## 2017-04-16 DIAGNOSIS — E785 Hyperlipidemia, unspecified: Secondary | ICD-10-CM | POA: Diagnosis not present

## 2017-04-16 DIAGNOSIS — I10 Essential (primary) hypertension: Secondary | ICD-10-CM | POA: Diagnosis not present

## 2017-04-16 DIAGNOSIS — F17210 Nicotine dependence, cigarettes, uncomplicated: Secondary | ICD-10-CM | POA: Insufficient documentation

## 2017-04-16 DIAGNOSIS — K219 Gastro-esophageal reflux disease without esophagitis: Secondary | ICD-10-CM | POA: Diagnosis not present

## 2017-04-16 DIAGNOSIS — Z7982 Long term (current) use of aspirin: Secondary | ICD-10-CM | POA: Insufficient documentation

## 2017-04-16 DIAGNOSIS — Z7901 Long term (current) use of anticoagulants: Secondary | ICD-10-CM | POA: Insufficient documentation

## 2017-04-16 DIAGNOSIS — Z86711 Personal history of pulmonary embolism: Secondary | ICD-10-CM | POA: Diagnosis not present

## 2017-04-16 DIAGNOSIS — I214 Non-ST elevation (NSTEMI) myocardial infarction: Secondary | ICD-10-CM

## 2017-04-16 DIAGNOSIS — G629 Polyneuropathy, unspecified: Secondary | ICD-10-CM | POA: Insufficient documentation

## 2017-04-16 DIAGNOSIS — Z79899 Other long term (current) drug therapy: Secondary | ICD-10-CM | POA: Insufficient documentation

## 2017-04-16 DIAGNOSIS — Z86718 Personal history of other venous thrombosis and embolism: Secondary | ICD-10-CM | POA: Diagnosis not present

## 2017-04-16 DIAGNOSIS — F419 Anxiety disorder, unspecified: Secondary | ICD-10-CM | POA: Insufficient documentation

## 2017-04-16 DIAGNOSIS — F329 Major depressive disorder, single episode, unspecified: Secondary | ICD-10-CM | POA: Insufficient documentation

## 2017-04-16 DIAGNOSIS — Q600 Renal agenesis, unilateral: Secondary | ICD-10-CM | POA: Insufficient documentation

## 2017-04-16 MED ORDER — LISINOPRIL 10 MG PO TABS: 10.0000 mg | ORAL_TABLET | Freq: Every day | ORAL | 3 refills | Status: DC

## 2017-04-16 NOTE — Progress Notes (Signed)
Daily Session Note  Patient Details  Name: Jennifer [name] MRN: [id number] Date of Birth: 1950/03/18 Referring Provider:     [place] from 04/12/2017 in [place]  Referring Provider  Dr. Bronson Ing      Encounter Date: 04/16/2017  Check In: Session Check In - 04/16/17 1104      Check-In   Location  [facility]    Staff Present  Aundra Dubin, RN, BSN;Diane Coad, MS, EP, [facility], Exercise Physiologist;Haji Delaine Luther Parody, BS, EP, Exercise Physiologist    Supervising physician immediately available to respond to emergencies  See telemetry face sheet for immediately available MD    Medication changes reported      No    Fall or balance concerns reported     No    Warm-up and Cool-down  Performed as group-led instruction    Resistance Training Performed  Yes    VAD Patient?  No      Pain Assessment   Currently in Pain?  No/denies    Pain Score  0-No pain    Multiple Pain Sites  No       Capillary Blood Glucose: No results found for this or any previous visit (from the past 24 hour(s)).    Social History   Tobacco Use  Smoking Status Current Every Day Smoker  . Packs/day: 0.50  . Years: 50.00  . Pack years: 25.00  . Types: Cigarettes  . Start date: 06/18/1967  Smokeless Tobacco Never Used  Tobacco Comment   smoked 3 cigarettes since sx    Goals Met:  Independence with exercise equipment Exercise tolerated well No report of cardiac concerns or symptoms Strength training completed today  Goals Unmet:  Not Applicable  Comments: Check out 1200   Dr. Kate Sable is Medical Director for [place] and Pulmonary Rehab.

## 2017-04-16 NOTE — Progress Notes (Signed)
Patient recieved the take home exercise plan today 04/16/2017. THR was addressed as was safety guidelines for being active when not in CR. Patient demonstrated an understanding and was encouraged to ask any future questions that may arise.  

## 2017-04-16 NOTE — Telephone Encounter (Signed)
The pt has been advised and will keep appt for Friday.  She will also come in for labs prior to the appt.

## 2017-04-16 NOTE — Telephone Encounter (Signed)
-----   Message from Herminio Commons, MD sent at 04/16/2017 12:09 PM EST ----- Regarding: RE: Patient BP low I will make medication adjustments and lower hypertensive therapy.  [place], can we reduce lisinopril to 10 mg daily? It appears she is presently taking 20 mg.  Dr. Bronson Ing  ----- Message ----- From: Gean Maidens Sent: 04/16/2017  11:39 AM To: Herminio Commons, MD Subject: Patient BP low                                 Dr. Bronson Ing,  [place] your patient has come to CR for her second day to exercise. Both times she complained of being dizzy. She also states that she gets dizzy in the morning when she gets up because she feels her pressure is low. Today she got dizzy as soon as she came in. We had to sit her down. Then her beginning BP before exercise 100/40. When she got on the TM 3 minutes in she got dizzy with BP 102/40, HR 94. We switched her to the Nustep BP120/65. Then she went to the Arm Crank BP 110/52. I noticed that she has a visit with you in March. Just did not know if you needed to see her sooner. Please advise. Thank you Diane Angelina Pih

## 2017-04-16 NOTE — Telephone Encounter (Signed)
Rx changed to lisinopril 10 mg daily.Pt may cut her 20's in half until finished, pt aware

## 2017-04-17 ENCOUNTER — Other Ambulatory Visit (INDEPENDENT_AMBULATORY_CARE_PROVIDER_SITE_OTHER): Payer: Medicare Other

## 2017-04-17 DIAGNOSIS — K921 Melena: Secondary | ICD-10-CM

## 2017-04-17 LAB — CBC WITH DIFFERENTIAL/PLATELET
Basophils Absolute: 0 10*3/uL (ref 0.0–0.1)
Basophils Relative: 0.7 % (ref 0.0–3.0)
Eosinophils Absolute: 0.2 10*3/uL (ref 0.0–0.7)
Eosinophils Relative: 2.9 % (ref 0.0–5.0)
HCT: 27.7 % — ABNORMAL LOW (ref 36.0–46.0)
Hemoglobin: 8.4 g/dL — ABNORMAL LOW (ref 12.0–15.0)
Lymphocytes Relative: 32.7 % (ref 12.0–46.0)
Lymphs Abs: 1.9 10*3/uL (ref 0.7–4.0)
MCHC: 30.4 g/dL (ref 30.0–36.0)
MCV: 76.5 fl — ABNORMAL LOW (ref 78.0–100.0)
Monocytes Absolute: 0.7 10*3/uL (ref 0.1–1.0)
Monocytes Relative: 12.1 % — ABNORMAL HIGH (ref 3.0–12.0)
Neutro Abs: 3 10*3/uL (ref 1.4–7.7)
Neutrophils Relative %: 51.6 % (ref 43.0–77.0)
Platelets: 308 10*3/uL (ref 150.0–400.0)
RBC: 3.62 Mil/uL — ABNORMAL LOW (ref 3.87–5.11)
RDW: 20.9 % — ABNORMAL HIGH (ref 11.5–15.5)
WBC: 5.8 10*3/uL (ref 4.0–10.5)

## 2017-04-18 ENCOUNTER — Telehealth: Payer: Self-pay | Admitting: Physician Assistant

## 2017-04-18 ENCOUNTER — Encounter (HOSPITAL_COMMUNITY): Payer: Medicare Other

## 2017-04-18 ENCOUNTER — Other Ambulatory Visit (HOSPITAL_COMMUNITY): Payer: Self-pay | Admitting: Internal Medicine

## 2017-04-18 DIAGNOSIS — Z1231 Encounter for screening mammogram for malignant neoplasm of breast: Secondary | ICD-10-CM

## 2017-04-20 ENCOUNTER — Ambulatory Visit: Payer: Medicare Other | Admitting: Physician Assistant

## 2017-04-20 ENCOUNTER — Encounter (HOSPITAL_COMMUNITY): Payer: Medicare Other

## 2017-04-20 ENCOUNTER — Ambulatory Visit (HOSPITAL_COMMUNITY)
Admission: RE | Admit: 2017-04-20 | Discharge: 2017-04-20 | Disposition: A | Discharge status: Home / Self Care | Payer: Medicare Other | Source: Ambulatory Visit | Attending: Internal Medicine | Admitting: Internal Medicine

## 2017-04-20 DIAGNOSIS — Z1231 Encounter for screening mammogram for malignant neoplasm of breast: Secondary | ICD-10-CM | POA: Diagnosis not present

## 2017-04-23 ENCOUNTER — Encounter (HOSPITAL_COMMUNITY): Payer: Medicare Other

## 2017-04-23 NOTE — Progress Notes (Signed)
Cardiac Individual Treatment Plan  Patient Details  Name: Jennifer [name] MRN: [id number] Date of Birth: 1951-01-20 Referring Provider:     [facility] from 04/12/2017 in [place]  Referring Provider  Dr. Bronson Ing      Initial Encounter Date:    CARDIAC REHAB PHASE II ORIENTATION from 04/12/2017 in [place]  Date  04/12/17  Referring Provider  Dr. Bronson Ing      Visit Diagnosis: NSTEMI (non-ST elevated myocardial infarction) ([place])  S/P CABG x 3  Patient's Home Medications on Admission:  Current Outpatient Medications:  .  allopurinol (ZYLOPRIM) 300 MG tablet, Take 300 mg by mouth daily., Disp: , Rfl:  .  aspirin EC 81 MG EC tablet, Take 1 tablet (81 mg total) by mouth daily., Disp: , Rfl:  .  atorvastatin (LIPITOR) 40 MG tablet, Take 1 tablet (40 mg total) by mouth daily., Disp: 90 tablet, Rfl: 3 .  calcium carbonate (TUMS - DOSED IN MG ELEMENTAL CALCIUM) 500 MG chewable tablet, Chew 2 tablets by mouth 3 (three) times daily with meals. , Disp: , Rfl:  .  Cholecalciferol (VITAMIN D) 2000 units CAPS, Take 2,000 Units by mouth daily., Disp: , Rfl:  .  DULoxetine (CYMBALTA) 30 MG capsule, Take 30 mg by mouth daily., Disp: , Rfl:  .  gabapentin (NEURONTIN) 600 MG tablet, Take 300-600 mg by mouth See admin instructions. Take 300 mg twice daily IN THE MORNING AND AT NOON AND  600 mg at bedtime., Disp: , Rfl:  .  lisinopril (PRINIVIL,ZESTRIL) 10 MG tablet, Take 1 tablet (10 mg total) by mouth daily., Disp: 90 tablet, Rfl: 3 .  metoprolol tartrate (LOPRESSOR) 25 MG tablet, Take 0.5 tablets (12.5 mg total) by mouth 2 (two) times daily., Disp: 90 tablet, Rfl: 3 .  Oxycodone HCl 10 MG TABS, Take 10 mg by mouth 3 (three) times daily as needed. For pain, Disp: , Rfl:  .  ranitidine (ZANTAC) 150 MG tablet, Take 150 mg by mouth daily as needed for heartburn., Disp: , Rfl:  .  rivaroxaban (XARELTO) 20 MG TABS tablet, Take 20 mg  by mouth daily with supper., Disp: , Rfl:   Past Medical History: Past Medical History:  Diagnosis Date  . Anxiety   . Arthritis   . CAD (coronary artery disease)    a. Cath 12/2014 - mild-moderate non-obstructive CAD with 60% mid RCA, 50% mid Cx-distal Cx, 30% distal LAD, 25% D2, EF 55-65%. b. 02/2017: NSTEMI with cath showing 60% LM stenosis and 100% RCA stenosis --> CT Surgery consulted with CABG on 02/21/2017 (LIMA-LAD, SVG-OM, and SVG-PDA)  . Depression   . Dizziness   . DVT (deep venous thrombosis) ([facility])    on Xarelto  . Dyspnea    W/ EXERTION  . GERD (gastroesophageal reflux disease)   . Headache   . Heart murmur    DX   AT BIRTH  . Hyperlipidemia   . Hypertension   . 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 Neuropathy   . PE (pulmonary embolism)   . Tobacco abuse     Tobacco Use: Social History   Tobacco Use  Smoking Status Current Every Day Smoker  . Packs/day: 0.50  . Years: 50.00  . Pack years: 25.00  . Types: Cigarettes  . Start date: 06/18/1967  Smokeless Tobacco Never Used  Tobacco Comment   smoked 3 cigarettes since sx    Labs: Recent Review Flowsheet Data  Labs for ITP Cardiac and Pulmonary Rehab Latest Ref Rng & Units 02/21/2017 02/21/2017 02/21/2017 02/21/2017 02/22/2017   Cholestrol 0 - 200 mg/dL - - - - -   LDLCALC 0 - 99 mg/dL - - - - -   HDL >40 mg/dL - - - - -   Trlycerides <150 mg/dL - - - - -   Hemoglobin A1c 4.8 - 5.6 % - - - - -   PHART 7.350 - 7.450 7.379 7.334(L) - - -   PCO2ART 32.0 - 48.0 mmHg 45.8 43.4 - - -   HCO3 20.0 - 28.0 mmol/L 27.1 23.1 - - -   TCO2 22 - 32 mmol/L 28 24 28 26 24    ACIDBASEDEF 0.0 - 2.0 mmol/L - 3.0(H) - - -   O2SAT % 97.0 95.0 - - -      Capillary Blood Glucose: Lab Results  Component Value Date   GLUCAP 97 02/25/2017   GLUCAP 122 (H) 02/24/2017   GLUCAP 104 (H) 02/24/2017   GLUCAP 92 02/24/2017   GLUCAP 129 (H) 02/24/2017     Exercise Target Goals:    Exercise Program  Goal: Individual exercise prescription set using results from initial 6 min walk test and THRR while considering  patient's activity barriers and safety.   Exercise Prescription Goal: Starting with aerobic activity 30 plus minutes a day, 3 days per week for initial exercise prescription. Provide home exercise prescription and guidelines that participant acknowledges understanding prior to discharge.  Activity Barriers & Risk Stratification: Activity Barriers & Cardiac Risk Stratification - 04/12/17 0954      Activity Barriers & Cardiac Risk Stratification   Activity Barriers  Deconditioning;Balance Concerns    Cardiac Risk Stratification  High       6 Minute Walk: 6 Minute Walk    Row Name 04/12/17 0952         6 Minute Walk   Phase  Initial     Distance  1200 feet     Distance % Change  0 %     Distance Feet Change  0 ft     Walk Time  6 minutes     # of Rest Breaks  0     MPH  2.27     METS  2.74     RPE  13     Perceived Dyspnea   13     VO2 Peak  9.39     Symptoms  No     Resting HR  71 bpm     Resting BP  126/58     Resting Oxygen Saturation   96 %     Exercise Oxygen Saturation  during 6 min walk  93 %     Max Ex. HR  117 bpm     Max Ex. BP  144/62     2 Minute Post BP  132/58        Oxygen Initial Assessment:   Oxygen Re-Evaluation:   Oxygen Discharge (Final Oxygen Re-Evaluation):   Initial Exercise Prescription: Initial Exercise Prescription - 04/12/17 0900      Date of Initial Exercise RX and Referring Provider   Date  04/12/17    Referring Provider  Dr. Bronson Ing      Treadmill   MPH  1    Grade  0    Minutes  15    METs  1.7      NuStep   Level  2    SPM  55  Minutes  20    METs  1.5      Prescription Details   Frequency (times per week)  3    Duration  Progress to 30 minutes of continuous aerobic without signs/symptoms of physical distress      Intensity   THRR 40-80% of Max Heartrate  104-120-137    Ratings of Perceived  Exertion  11-13    Perceived Dyspnea  0-4      Progression   Progression  Continue progressive overload as per policy without signs/symptoms or physical distress.      Resistance Training   Training Prescription  Yes    Weight  1    Reps  10-15       Perform Capillary Blood Glucose checks as needed.  Exercise Prescription Changes:  Exercise Prescription Changes    Row Name 04/16/17 1400 04/23/17 1400           Response to Exercise   Blood Pressure (Admit)  -  100/40      Blood Pressure (Exercise)  -  120/65      Blood Pressure (Exit)  -  90/64      Heart Rate (Admit)  -  80 bpm      Heart Rate (Exercise)  -  91 bpm      Heart Rate (Exit)  -  87 bpm      Rating of Perceived Exertion (Exercise)  -  13      Duration  -  Progress to 30 minutes of  aerobic without signs/symptoms of physical distress      Intensity  -  THRR New [phone]        Progression   Progression  -  Continue to progress workloads to maintain intensity without signs/symptoms of physical distress.        Resistance Training   Training Prescription  Yes  Yes      Weight  1  1      Reps  10-15  10-15        Treadmill   MPH  1  -      Grade  0  -      Minutes  15  -      METs  1.7  -        NuStep   Level  2  2      SPM  55  72      Minutes  20  20      METs  1.5  1.7        Arm Ergometer   Level  -  1.5      Watts  -  11      Minutes  -  20      METs  -  2.1        Home Exercise Plan   Plans to continue exercise at  Home (comment)  Home (comment)      Frequency  Add 2 additional days to program exercise sessions.  Add 2 additional days to program exercise sessions.      Initial Home Exercises Provided  04/16/17  04/16/17         Exercise Comments:  Exercise Comments    Row Name 04/16/17 1422 04/23/17 1427         Exercise Comments  Patient recieved the take home exercise plan today 04/16/2017. THR was addressed as was safety guidelines for being active when not in CR. Patient  demonstrated an  understanding and was encouraged to ask any future questions that may arise.   Patient was taken off of the treadmill due to dizziness and fall risk. Patient has not returned to CR yet. Patient is awaiting visit with her doctor before returning to CR,. No progression has been made.          Exercise Goals and Review:  Exercise Goals    Row Name 04/12/17 0955             Exercise Goals   Increase Physical Activity  Yes       Intervention  Provide advice, education, support and counseling about physical activity/exercise needs.;Develop an individualized exercise prescription for aerobic and resistive training based on initial evaluation findings, risk stratification, comorbidities and participant's personal goals.       Expected Outcomes  Short Term: Attend rehab on a regular basis to increase amount of physical activity.       Increase Strength and Stamina  Yes       Intervention  Provide advice, education, support and counseling about physical activity/exercise needs.;Develop an individualized exercise prescription for aerobic and resistive training based on initial evaluation findings, risk stratification, comorbidities and participant's personal goals.       Expected Outcomes  Short Term: Increase workloads from initial exercise prescription for resistance, speed, and METs.       Able to understand and use rate of perceived exertion (RPE) scale  Yes       Intervention  Provide education and explanation on how to use RPE scale       Expected Outcomes  Short Term: Able to use RPE daily in rehab to express subjective intensity level;Long Term:  Able to use RPE to guide intensity level when exercising independently       Able to understand and use Dyspnea scale  Yes       Intervention  Provide education and explanation on how to use Dyspnea scale       Expected Outcomes  Long Term: Able to use Dyspnea scale to guide intensity level when exercising independently;Short Term: Able  to use Dyspnea scale daily in rehab to express subjective sense of shortness of breath during exertion       Knowledge and understanding of Target Heart Rate Range (THRR)  Yes       Intervention  Provide education and explanation of THRR including how the numbers were predicted and where they are located for reference       Expected Outcomes  Short Term: Able to state/look up THRR       Able to check pulse independently  Yes       Intervention  Provide education and demonstration on how to check pulse in carotid and radial arteries.;Review the importance of being able to check your own pulse for safety during independent exercise       Expected Outcomes  Short Term: Able to explain why pulse checking is important during independent exercise;Long Term: Able to check pulse independently and accurately       Understanding of Exercise Prescription  Yes       Intervention  Provide education, explanation, and written materials on patient's individual exercise prescription       Expected Outcomes  Short Term: Able to explain program exercise prescription;Long Term: Able to explain home exercise prescription to exercise independently          Exercise Goals Re-Evaluation : Exercise Goals Re-Evaluation    [place] Name 04/23/17 1426  Exercise Goal Re-Evaluation   Exercise Goals Review  Increase Physical Activity;Increase Strength and Stamina;Knowledge and understanding of Target Heart Rate Range (THRR)       Comments  Patient was taken off of the treadmill due to dizziness and fall risk. Patient has not returned to CR yet. Patient is awaiting visit with her doctor before returning to CR,. No progression has been made.        Expected Outcomes  Patient wishes to be able to exercise at a pace for heart benefit and to get stronger and to live healthier.            Discharge Exercise Prescription (Final Exercise Prescription Changes): Exercise Prescription Changes - 04/23/17 1400       Response to Exercise   Blood Pressure (Admit)  100/40    Blood Pressure (Exercise)  120/65    Blood Pressure (Exit)  90/64    Heart Rate (Admit)  80 bpm    Heart Rate (Exercise)  91 bpm    Heart Rate (Exit)  87 bpm    Rating of Perceived Exertion (Exercise)  13    Duration  Progress to 30 minutes of  aerobic without signs/symptoms of physical distress    Intensity  THRR New [phone]      Progression   Progression  Continue to progress workloads to maintain intensity without signs/symptoms of physical distress.      Resistance Training   Training Prescription  Yes    Weight  1    Reps  10-15      NuStep   Level  2    SPM  72    Minutes  20    METs  1.7      Arm Ergometer   Level  1.5    Watts  11    Minutes  20    METs  2.1      Home Exercise Plan   Plans to continue exercise at  Home (comment)    Frequency  Add 2 additional days to program exercise sessions.    Initial Home Exercises Provided  04/16/17       Nutrition:  Target Goals: Understanding of nutrition guidelines, daily intake of sodium 1500mg , cholesterol 200mg , calories 30% from fat and 7% or less from saturated fats, daily to have 5 or more servings of fruits and vegetables.  Biometrics: Pre Biometrics - 04/12/17 0955      Pre Biometrics   Height  5\' 4"  (1.626 m)    Weight  207 lb 6.4 oz (94.1 kg)    Waist Circumference  42.75 inches    Hip Circumference  47 inches    Waist to Hip Ratio  0.91 %    BMI (Calculated)  35.58    Triceps Skinfold  17 mm    % Body Fat  43.2 %    Grip Strength  56.4 kg    Flexibility  0 in LBP    Single Leg Stand  16 seconds        Nutrition Therapy Plan and Nutrition Goals: Nutrition Therapy & Goals - 04/12/17 1010      Personal Nutrition Goals   Personal Goal #2  Patient is following a heart healthy diet.    Additional Goals?  No       Nutrition Assessments: Nutrition Assessments - 04/12/17 1011      MEDFICTS Scores   Pre Score  18       Nutrition  Goals Re-Evaluation:   Nutrition  Goals Discharge (Final Nutrition Goals Re-Evaluation):   Psychosocial: Target Goals: Acknowledge presence or absence of significant depression and/or stress, maximize coping skills, provide positive support system. Participant is able to verbalize types and ability to use techniques and skills needed for reducing stress and depression.  Initial Review & Psychosocial Screening: Initial Psych Review & Screening - 04/12/17 1015      Initial Review   Current issues with  History of Depression      Family Dynamics   Good Support System?  No    Concerns  Inappropriate over/under dependence on family/friends;No support system See feels her friends are supportive, her family is not.       Barriers   Psychosocial barriers to participate in program  Psychosocial barriers identified (see note)      Screening Interventions   Interventions  Encouraged to exercise;Provide feedback about the scores to participant    Expected Outcomes  Short Term goal: Identification and review with participant of any Quality of Life or Depression concerns found by scoring the questionnaire.;Long Term goal: The participant improves quality of Life and PHQ9 Scores as seen by post scores and/or verbalization of changes       Quality of Life Scores: Quality of Life - 04/12/17 0956      Quality of Life Scores   Health/Function Pre  21.5 %    Socioeconomic Pre  24 %    Psych/Spiritual Pre  19.86 %    Family Pre  6 %    GLOBAL Pre  19.77 %      Scores of 19 and below usually indicate a poorer quality of life in these areas.  A difference of  2-3 points is a clinically meaningful difference.  A difference of 2-3 points in the total score of the Quality of Life Index has been associated with significant improvement in overall quality of life, self-image, physical symptoms, and general health in studies assessing change in quality of life.  PHQ-9: Recent Review Flowsheet Data     Depression screen [facility] 2/9 04/12/2017   Decreased Interest 0   Down, Depressed, Hopeless 0   PHQ - 2 Score 0   Altered sleeping 1   Tired, decreased energy 1   Change in appetite 1   Feeling bad or failure about yourself  0   Trouble concentrating 0   Moving slowly or fidgety/restless 0   Suicidal thoughts 0   PHQ-9 Score 3   Difficult doing work/chores Not difficult at all     Interpretation of Total Score  Total Score Depression Severity:  1-4 = Minimal depression, 5-9 = Mild depression, 10-14 = Moderate depression, 15-19 = Moderately severe depression, 20-27 = Severe depression   Psychosocial Evaluation and Intervention: Psychosocial Evaluation - 04/12/17 1019      Psychosocial Evaluation & Interventions   Interventions  Encouraged to exercise with the program and follow exercise prescription    Continue Psychosocial Services   Follow up required by staff       Psychosocial Re-Evaluation:   Psychosocial Discharge (Final Psychosocial Re-Evaluation):   Vocational Rehabilitation: Provide vocational rehab assistance to qualifying candidates.   Vocational Rehab Evaluation & Intervention: Vocational Rehab - 04/12/17 1006      Initial Vocational Rehab Evaluation & Intervention   Assessment shows need for Vocational Rehabilitation  No       Education: Education Goals: Education classes will be provided on a weekly basis, covering required topics. Participant will state understanding/return demonstration of topics presented.  Learning  Barriers/Preferences: Learning Barriers/Preferences - 04/12/17 0954      Learning Barriers/Preferences   Learning Barriers  None    Learning Preferences  Pictoral;Skilled Demonstration;Video;Written Material;Individual Instruction;Group Instruction;Computer/Internet       Education Topics: Hypertension, Hypertension Reduction -Define heart disease and high blood pressure. Discus how high blood pressure affects the body and ways to  reduce high blood pressure.   CARDIAC REHAB PHASE II EXERCISE from 04/16/2017 in [place]  Date  04/18/17  Educator  DC  Instruction Review Code  2- Demonstrated Understanding      Exercise and Your Heart -Discuss why it is important to exercise, the FITT principles of exercise, normal and abnormal responses to exercise, and how to exercise safely.   Angina -Discuss definition of angina, causes of angina, treatment of angina, and how to decrease risk of having angina.   Cardiac Medications -Review what the following cardiac medications are used for, how they affect the body, and side effects that may occur when taking the medications.  Medications include Aspirin, Beta blockers, calcium channel blockers, ACE Inhibitors, angiotensin receptor blockers, diuretics, digoxin, and antihyperlipidemics.   Congestive Heart Failure -Discuss the definition of CHF, how to live with CHF, the signs and symptoms of CHF, and how keep track of weight and sodium intake.   Heart Disease and Intimacy -Discus the effect sexual activity has on the heart, how changes occur during intimacy as we age, and safety during sexual activity.   Smoking Cessation / COPD -Discuss different methods to quit smoking, the health benefits of quitting smoking, and the definition of COPD.   Nutrition I: Fats -Discuss the types of cholesterol, what cholesterol does to the heart, and how cholesterol levels can be controlled.   Nutrition II: Labels -Discuss the different components of food labels and how to read food label   Heart Parts/Heart Disease and PAD -Discuss the anatomy of the heart, the pathway of blood circulation through the heart, and these are affected by heart disease.   Stress I: Signs and Symptoms -Discuss the causes of stress, how stress may lead to anxiety and depression, and ways to limit stress.   Stress II: Relaxation -Discuss different types of relaxation techniques  to limit stress.   Warning Signs of Stroke / TIA -Discuss definition of a stroke, what the signs and symptoms are of a stroke, and how to identify when someone is having stroke.   Knowledge Questionnaire Score: Knowledge Questionnaire Score - 04/12/17 0954      Knowledge Questionnaire Score   Pre Score  26/28       Core Components/Risk Factors/Patient Goals at Admission: Personal Goals and Risk Factors at Admission - 04/12/17 1011      Core Components/Risk Factors/Patient Goals on Admission    Weight Management  Yes    Admit Weight  207 lb (93.9 kg)    Goal Weight: Short Term  202 lb (91.6 kg)    Goal Weight: Long Term  192 lb (87.1 kg)    Expected Outcomes  Short Term: Continue to assess and modify interventions until short term weight is achieved;Long Term: Adherence to nutrition and physical activity/exercise program aimed toward attainment of established weight goal    Tobacco Cessation  Yes    Intervention  Assist the participant in steps to quit. Provide individualized education and counseling about committing to Tobacco Cessation, relapse prevention, and pharmacological support that can be provided by physician.;Advice worker, assist with locating and accessing local/national Quit Smoking programs, and  support quit date choice.    Personal Goal Other  Yes    Personal Goal  Learn how to exercise at a pace for heart benefits. Get stonger, Lose 27lbs, and live a healthier life.     Intervention  Attend 3 x week and supplement with at home exercise 2 x week. Also attend smoking cessation classes offered at [facility].     Expected Outcomes  Reach goals       Core Components/Risk Factors/Patient Goals Review:    Core Components/Risk Factors/Patient Goals at Discharge (Final Review):    ITP Comments: ITP Comments    Row Name 04/12/17 1006 04/23/17 1316         ITP Comments  Ms. Clouatre is a plesant 42 yr. old female reovering from CABGx3. She is still smoking  cigaretts 1/2 ppd. Smoking Cessation schedule was given to patient.   Patient new to the program completing 2 sessions. She is currently on hold until she sees a gastroenterologist on Wednesday to be evaluated for anemia. Her last hgb was 8.4 on 04/17/17.  She complains of chronic dizziness and weakness. Will continue to monitor.          Comments: ITP 30 Day REVIEW Patient new to the program completing 2 sessions. She is currently on hold until she sees a gastroenterologist on Wednesday to be evaluated for anemia. Her last hgb was 8.4 on 04/17/17.  She complains of chronic dizziness and weakness. Will continue to monitor.

## 2017-04-25 ENCOUNTER — Encounter (HOSPITAL_COMMUNITY): Payer: Medicare Other

## 2017-04-26 DIAGNOSIS — R7309 Other abnormal glucose: Secondary | ICD-10-CM | POA: Diagnosis not present

## 2017-04-27 ENCOUNTER — Encounter (HOSPITAL_COMMUNITY): Payer: Medicare Other

## 2017-04-30 ENCOUNTER — Encounter (HOSPITAL_COMMUNITY): Payer: Medicare Other

## 2017-05-02 ENCOUNTER — Encounter: Payer: Self-pay | Admitting: Physician Assistant

## 2017-05-02 ENCOUNTER — Ambulatory Visit (INDEPENDENT_AMBULATORY_CARE_PROVIDER_SITE_OTHER): Payer: Medicare Other | Admitting: Physician Assistant

## 2017-05-02 ENCOUNTER — Encounter (HOSPITAL_COMMUNITY): Payer: Medicare Other

## 2017-05-02 ENCOUNTER — Other Ambulatory Visit (INDEPENDENT_AMBULATORY_CARE_PROVIDER_SITE_OTHER): Payer: Medicare Other

## 2017-05-02 VITALS — BP 128/76 | HR 70 | Ht 64.0 in | Wt 206.4 lb

## 2017-05-02 DIAGNOSIS — D509 Iron deficiency anemia, unspecified: Secondary | ICD-10-CM

## 2017-05-02 DIAGNOSIS — R195 Other fecal abnormalities: Secondary | ICD-10-CM

## 2017-05-02 DIAGNOSIS — Z86010 Personal history of colonic polyps: Secondary | ICD-10-CM

## 2017-05-02 LAB — CBC WITH DIFFERENTIAL/PLATELET
Basophils Absolute: 0.1 10*3/uL (ref 0.0–0.1)
Basophils Relative: 0.8 % (ref 0.0–3.0)
Eosinophils Absolute: 0.1 10*3/uL (ref 0.0–0.7)
Eosinophils Relative: 1.8 % (ref 0.0–5.0)
HCT: 27.8 % — ABNORMAL LOW (ref 36.0–46.0)
Hemoglobin: 8.3 g/dL — ABNORMAL LOW (ref 12.0–15.0)
Lymphocytes Relative: 31.8 % (ref 12.0–46.0)
Lymphs Abs: 2.1 10*3/uL (ref 0.7–4.0)
MCHC: 29.9 g/dL — ABNORMAL LOW (ref 30.0–36.0)
MCV: 72.4 fl — ABNORMAL LOW (ref 78.0–100.0)
Monocytes Absolute: 0.4 10*3/uL (ref 0.1–1.0)
Monocytes Relative: 6.3 % (ref 3.0–12.0)
Neutro Abs: 3.8 10*3/uL (ref 1.4–7.7)
Neutrophils Relative %: 59.3 % (ref 43.0–77.0)
Platelets: 312 10*3/uL (ref 150.0–400.0)
RBC: 3.84 Mil/uL — ABNORMAL LOW (ref 3.87–5.11)
RDW: 19.7 % — ABNORMAL HIGH (ref 11.5–15.5)
WBC: 6.5 10*3/uL (ref 4.0–10.5)

## 2017-05-02 LAB — IBC PANEL
Iron: 17 ug/dL — ABNORMAL LOW (ref 42–145)
Saturation Ratios: 3.2 % — ABNORMAL LOW (ref 20.0–50.0)
Transferrin: 379 mg/dL — ABNORMAL HIGH (ref 212.0–360.0)

## 2017-05-02 LAB — FERRITIN: Ferritin: 6 ng/mL — ABNORMAL LOW (ref 10.0–291.0)

## 2017-05-02 MED ORDER — RANITIDINE HCL 150 MG PO TABS: 150.0000 mg | ORAL_TABLET | Freq: Two times a day (BID) | ORAL | 6 refills | Status: DC

## 2017-05-02 NOTE — Progress Notes (Signed)
Subjective:    Patient ID: Jennifer [name], female    DOB: [birth year], 67 y.o.   MRN: [id number]  HPI Jennifer [name] is a pleasant 67 year old white female, referred today by Dr. Redmond School for progressive anemia. Patient had recently been seen in consultation during hospitalization in December 2018 by Dr. Ardis Hughs. At that time she was evaluated for anemia and heme positive stool was hospitalized with an acute MI. She had EGD done on 02/16/2017 which was normal and then colonoscopy with removal of a 3 mm and a 9 mm polyp both of which were tubular adenomas, and also had multiple diverticuli. At that point she was cleared for surgery and underwent CABG 3 on 02/21/2017. Patient had required transfusions during that admission. On 02/22/2017 hemoglobin was 11.2. Since discharge from the hospital she had hemoglobin of 9.4 on January 22, and on repeat 04/17/2017 hemoglobin 8.4 hematocrit of 27.7 and MCV of 76. Patient is trying to attend cardiac rehabilitation but was told by rehabilitation that she needed to be cleared by GI prior to coming back because she had a lot of difficulty with the treadmill last time she was at rehabilitation with diaphoresis and weakness area She says she has been feeling very fatigued and she does get exertional shortness of breath. Her stools have been normal appearing to her. She says she did see some very dark stools prior to having all of her issues in December but hasn't seen any since. She does have some generalized gas and bloating, no nausea or vomiting. She has been taking Zantac periodically. 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 She tells me that she had a superficial clot in January 2019 in the left lower extremity. She is being maintained on Xarelto currently and also is on a baby aspirin.  Review of Systems Pertinent positive and negative review of systems were noted in the above HPI section.  All other  review of systems was otherwise negative.  Outpatient Encounter Medications as of 05/02/2017  Medication Sig  . allopurinol (ZYLOPRIM) 300 MG tablet Take 300 mg by mouth daily.  Marland Kitchen aspirin EC 81 MG EC tablet Take 1 tablet (81 mg total) by mouth daily.  Marland Kitchen atorvastatin (LIPITOR) 40 MG tablet Take 1 tablet (40 mg total) by mouth daily.  . calcium carbonate (TUMS - DOSED IN MG ELEMENTAL CALCIUM) 500 MG chewable tablet Chew 2 tablets by mouth 3 (three) times daily with meals.   . Cholecalciferol (VITAMIN D) 2000 units CAPS Take 2,000 Units by mouth daily.  . DULoxetine (CYMBALTA) 30 MG capsule Take 30 mg by mouth daily.  Marland Kitchen gabapentin (NEURONTIN) 600 MG tablet Take 300-600 mg by mouth See admin instructions. Take 300 mg twice daily IN THE MORNING AND AT NOON AND  600 mg at bedtime.  Marland Kitchen lisinopril (PRINIVIL,ZESTRIL) 5 MG tablet Take 5 mg by mouth daily.  . metoprolol tartrate (LOPRESSOR) 25 MG tablet Take 0.5 tablets (12.5 mg total) by mouth 2 (two) times daily.  . Oxycodone HCl 10 MG TABS Take 10 mg by mouth 3 (three) times daily as needed. For pain  . ranitidine (ZANTAC) 150 MG tablet Take 1 tablet (150 mg total) by mouth 2 (two) times daily.  . rivaroxaban (XARELTO) 20 MG TABS tablet Take 20 mg by mouth daily with supper.  . [DISCONTINUED] lisinopril (PRINIVIL,ZESTRIL) 10 MG tablet Take 1 tablet (10 mg total) by mouth daily.  . [DISCONTINUED] ranitidine (ZANTAC)  150 MG tablet Take 150 mg by mouth daily as needed for heartburn.   No facility-administered encounter medications on file as of 05/02/2017.    Allergies  Allergen Reactions  . Bee Venom Anaphylaxis   Patient Active Problem List   Diagnosis Date Noted  . S/P CABG x 3 02/21/2017  . Blood in stool   . Benign neoplasm of sigmoid colon   . Elevated troponin   . Symptomatic anemia 02/13/2017  . NSTEMI (non-ST elevated myocardial infarction) ([place]) 02/13/2017  . Symptomatic stenosis of right carotid artery without infarction 11/01/2016  .  Syncope and collapse 08/10/2016  . Morbid obesity ([place]) 08/10/2016  . Low back pain 07/28/2016  . Gait abnormality 07/28/2016  . Carotid artery stenosis 07/28/2016  . Paresthesia 07/28/2016  . Dizziness 06/20/2016  . Numbness 06/20/2016  . CAD in native artery 01/08/2015  . Chest pain 01/06/2015  . Essential hypertension 01/06/2015  . Hyperlipidemia 01/06/2015  . History of pulmonary embolus (PE) 01/06/2015  . Leukocytosis 01/06/2015  . Tobacco use disorder 01/06/2015  . History of DVT (deep vein thrombosis) 01/06/2015   Social History   Socioeconomic History  . Marital status: Divorced    Spouse name: Not on file  . Number of children: 2  . Years of education: 90  . Highest education level: Not on file  Social Needs  . Financial resource strain: Not on file  . Food insecurity - worry: Not on file  . Food insecurity - inability: Not on file  . Transportation needs - medical: Not on file  . Transportation needs - non-medical: Not on file  Occupational History  . Occupation: Retired  Tobacco Use  . Smoking status: Current Every Day Smoker    Packs/day: 0.50    Years: 50.00    Pack years: 25.00    Types: Cigarettes    Start date: 06/18/1967  . Smokeless tobacco: Never Used  . Tobacco comment: smoked 3 cigarettes since sx  Substance and Sexual Activity  . Alcohol use: No    Alcohol/week: 0.0 oz  . Drug use: No  . Sexual activity: Not on file  Other Topics Concern  . Not on file  Social History Narrative   Lives at home alone.   Right-handed.   2 cups caffeine per day.    Jennifer [name] family history includes Dementia in her paternal grandmother; Heart attack in her brother, brother, brother, father, and mother; Heart disease in her maternal grandfather; Ovarian cancer in her maternal grandmother.      Objective:    Vitals:   05/02/17 1418  BP: 128/76  Pulse: 70    Physical Exam well-developed older white female in no acute distress, very pleasant blood  pressure 128/76 pulse 70, height 5 foot 4, weight 206, BMI 35.4. HEENT; nontraumatic normocephalic EOMI PERRLA sclera anicteric, Cardiovascular; regular rate and rhythm with S1-S2 no murmur or gallop she has a sternal incisional scar healing, Pulmonary ;clear bilaterally, Abdomen ;obese soft nontender nondistended bowel sounds are active there is no palpable mass or hepatosplenomegaly, Rectal exam stool was brown and heme positive, Extremities ;no clubbing cyanosis or edema skin warm and dry, Neuropsych ;mood and affect appropriate       Assessment & Plan:   #78 67 year old white female with progressive anemia and heme positive stool documented again today. This is in the setting of chronic Xarelto and baby aspirin. Recent endoscopic evaluation December 2018 with normal EGD, colonoscopy with removal of 2 polyps and noted diverticulosis but no source  for ongoing GI blood loss. Will need to rule out small bowel source for GI blood loss i.e. AVMs or other erosive process #2 anemia symptomatic, suspect iron deficient #3 coronary artery disease status post MI December 2018 and CABG 3 2018 #4 history of recurrent DVTs and remote PE, on chronic Xarelto #5 morbid obesity #6 hypertension #7 tubular adenomatous colon polyps at recent colonoscopy indicated for 3 year interval follow-up. The larger of the 2 polyps showed focal high-grade dysplasia, negative at the margin  Plan; CBC with differential and iron studies today. I suspect patient will require outpatient transfusions, and should be transfused 2 if her hemoglobin is less than 8 Schedule for small bowel capsule endoscopy (this will be scheduled at: Hospital). Procedure was discussed in detail with the patient including indications risks and benefits and she is agreeable to proceed Patient is asked to takes Zantac 150 mg by mouth twice a day on a regular basis, hopefully this will help her reflux symptoms She will need to have's hemoglobins followed  serially at least every 2 weeks over the next several months, as she may require periodic transfusions. Depending on her course, may need to have further conversations about continuing chronic Xarelto.  Patient will be indicated for follow-up colonoscopy with Dr. Ardis Hughs in 3 years.  Amy S Esterwood PA-C 05/02/2017   Cc: Redmond School, MD

## 2017-05-02 NOTE — Patient Instructions (Addendum)
Your physician has requested that you go to the basement for lab work before leaving today.  We have sent the following medications to your pharmacy for you to pick up at your convenience: Zantac.  You have been scheduled for a Capsule Endoscopy at [facility] (admitting) on 05/08/17 at 9:00am. Please arrive at 8:30am. Please see separate instructions.

## 2017-05-03 NOTE — Progress Notes (Signed)
I agree with the above note, plan 

## 2017-05-04 ENCOUNTER — Encounter (HOSPITAL_COMMUNITY): Payer: Medicare Other

## 2017-05-07 ENCOUNTER — Encounter (HOSPITAL_COMMUNITY): Payer: Medicare Other

## 2017-05-08 ENCOUNTER — Ambulatory Visit (HOSPITAL_COMMUNITY)
Admission: RE | Admit: 2017-05-08 | Discharge: 2017-05-08 | Disposition: A | Discharge status: Home / Self Care | Payer: Medicare Other | Source: Ambulatory Visit | Attending: Gastroenterology | Admitting: Gastroenterology

## 2017-05-08 ENCOUNTER — Encounter: Payer: Self-pay | Admitting: Gastroenterology

## 2017-05-08 ENCOUNTER — Encounter (HOSPITAL_COMMUNITY)
Admission: RE | Disposition: A | Discharge status: Home / Self Care | Payer: Self-pay | Source: Ambulatory Visit | Attending: Gastroenterology

## 2017-05-08 ENCOUNTER — Other Ambulatory Visit: Payer: Self-pay

## 2017-05-08 DIAGNOSIS — K922 Gastrointestinal hemorrhage, unspecified: Secondary | ICD-10-CM | POA: Insufficient documentation

## 2017-05-08 DIAGNOSIS — Z86718 Personal history of other venous thrombosis and embolism: Secondary | ICD-10-CM | POA: Diagnosis not present

## 2017-05-08 DIAGNOSIS — D649 Anemia, unspecified: Secondary | ICD-10-CM

## 2017-05-08 DIAGNOSIS — Z7982 Long term (current) use of aspirin: Secondary | ICD-10-CM | POA: Insufficient documentation

## 2017-05-08 DIAGNOSIS — K31819 Angiodysplasia of stomach and duodenum without bleeding: Secondary | ICD-10-CM | POA: Insufficient documentation

## 2017-05-08 DIAGNOSIS — D509 Iron deficiency anemia, unspecified: Secondary | ICD-10-CM

## 2017-05-08 DIAGNOSIS — R195 Other fecal abnormalities: Secondary | ICD-10-CM | POA: Diagnosis not present

## 2017-05-08 DIAGNOSIS — Z7901 Long term (current) use of anticoagulants: Secondary | ICD-10-CM | POA: Diagnosis not present

## 2017-05-08 DIAGNOSIS — Z86711 Personal history of pulmonary embolism: Secondary | ICD-10-CM | POA: Insufficient documentation

## 2017-05-08 HISTORY — PX: GIVENS CAPSULE STUDY: SHX5432

## 2017-05-08 SURGERY — IMAGING PROCEDURE, GI TRACT, INTRALUMINAL, VIA CAPSULE
Anesthesia: LOCAL

## 2017-05-08 MED ORDER — INTEGRA PLUS PO CAPS: ORAL_CAPSULE | ORAL | 3 refills | Status: AC

## 2017-05-08 SURGICAL SUPPLY — 1 items: TOWEL COTTON PACK 4EA (MISCELLANEOUS) ×4 IMPLANT

## 2017-05-08 NOTE — Progress Notes (Signed)
Patient ingested givens capsule for small bowel endoscopy 05/08/2017 at [facility], verbal and written instructions given to patient. She will return equipment 05/09/17 AM.

## 2017-05-09 ENCOUNTER — Telehealth: Payer: Self-pay

## 2017-05-09 ENCOUNTER — Other Ambulatory Visit (INDEPENDENT_AMBULATORY_CARE_PROVIDER_SITE_OTHER): Payer: Medicare Other

## 2017-05-09 ENCOUNTER — Encounter (HOSPITAL_COMMUNITY): Payer: Medicare Other

## 2017-05-09 DIAGNOSIS — D649 Anemia, unspecified: Secondary | ICD-10-CM | POA: Diagnosis not present

## 2017-05-09 LAB — CBC WITH DIFFERENTIAL/PLATELET
Basophils Absolute: 0 10*3/uL (ref 0.0–0.1)
Basophils Relative: 0.8 % (ref 0.0–3.0)
Eosinophils Absolute: 0.1 10*3/uL (ref 0.0–0.7)
Eosinophils Relative: 1.9 % (ref 0.0–5.0)
HCT: 26.3 % — ABNORMAL LOW (ref 36.0–46.0)
Hemoglobin: 8 g/dL — CL (ref 12.0–15.0)
Lymphocytes Relative: 24.1 % (ref 12.0–46.0)
Lymphs Abs: 1.2 10*3/uL (ref 0.7–4.0)
MCHC: 30.4 g/dL (ref 30.0–36.0)
MCV: 71.1 fl — ABNORMAL LOW (ref 78.0–100.0)
Monocytes Absolute: 0.3 10*3/uL (ref 0.1–1.0)
Monocytes Relative: 6.6 % (ref 3.0–12.0)
Neutro Abs: 3.3 10*3/uL (ref 1.4–7.7)
Neutrophils Relative %: 66.6 % (ref 43.0–77.0)
Platelets: 305 10*3/uL (ref 150.0–400.0)
RBC: 3.69 Mil/uL — ABNORMAL LOW (ref 3.87–5.11)
RDW: 20.1 % — ABNORMAL HIGH (ref 11.5–15.5)
WBC: 4.9 10*3/uL (ref 4.0–10.5)

## 2017-05-09 NOTE — Telephone Encounter (Signed)
Lab called with critical lab value, Hgb 8.0, results are in Epic.

## 2017-05-10 ENCOUNTER — Encounter (HOSPITAL_COMMUNITY): Payer: Self-pay | Admitting: Gastroenterology

## 2017-05-10 NOTE — Addendum Note (Signed)
Encounter addended by: Suzanne Boron on: 05/10/2017 3:06 PM  Actions taken: Visit Navigator Flowsheet section accepted

## 2017-05-11 ENCOUNTER — Other Ambulatory Visit: Payer: Self-pay

## 2017-05-11 ENCOUNTER — Encounter (HOSPITAL_COMMUNITY): Payer: Medicare Other

## 2017-05-11 DIAGNOSIS — D649 Anemia, unspecified: Secondary | ICD-10-CM

## 2017-05-14 ENCOUNTER — Ambulatory Visit (HOSPITAL_COMMUNITY)
Admission: RE | Admit: 2017-05-14 | Discharge: 2017-05-14 | Disposition: A | Discharge status: Home / Self Care | Payer: Medicare Other | Source: Ambulatory Visit | Attending: Gastroenterology | Admitting: Gastroenterology

## 2017-05-14 ENCOUNTER — Encounter (HOSPITAL_COMMUNITY): Payer: Medicare Other

## 2017-05-14 DIAGNOSIS — D649 Anemia, unspecified: Secondary | ICD-10-CM | POA: Insufficient documentation

## 2017-05-14 LAB — PREPARE RBC (CROSSMATCH)

## 2017-05-14 MED ORDER — SODIUM CHLORIDE 0.9 % IV SOLN: Freq: Once | INTRAVENOUS | Status: DC

## 2017-05-15 ENCOUNTER — Telehealth: Payer: Self-pay | Admitting: Gastroenterology

## 2017-05-15 DIAGNOSIS — R195 Other fecal abnormalities: Secondary | ICD-10-CM

## 2017-05-15 LAB — BPAM RBC
Blood Product Expiration Date: 201903102359
Blood Product Expiration Date: 201903192359
ISSUE DATE / TIME: 201903040921
ISSUE DATE / TIME: 201903041152
Unit Type and Rh: 6200
Unit Type and Rh: 6200

## 2017-05-15 LAB — TYPE AND SCREEN
ABO/RH(D): A POS
Antibody Screen: NEGATIVE
Unit division: 0
Unit division: 0

## 2017-05-15 NOTE — Telephone Encounter (Signed)
  Capsule endoscopy report - 05/08/2017 - for Dr. Ardis Hughs - 3 clear small bowel AVMs noted - one of which located 1 minute after first duodenal image, another 10 minutes beyond first duodenal image, and the last in the mid small bowel.  - she is likely having anemia / heme positive stool from AVMs in the setting of anticoagulation - 2 AVMs likely within reach of enteroscope if ablation is considered.   Jennifer [name], results of capsule as above

## 2017-05-16 ENCOUNTER — Ambulatory Visit (INDEPENDENT_AMBULATORY_CARE_PROVIDER_SITE_OTHER): Payer: Medicare Other | Admitting: Cardiovascular Disease

## 2017-05-16 ENCOUNTER — Encounter (HOSPITAL_COMMUNITY): Payer: Medicare Other

## 2017-05-16 ENCOUNTER — Other Ambulatory Visit: Payer: Self-pay

## 2017-05-16 ENCOUNTER — Encounter: Payer: Self-pay | Admitting: Cardiovascular Disease

## 2017-05-16 VITALS — BP 156/78 | HR 93 | Ht 64.0 in | Wt 205.0 lb

## 2017-05-16 DIAGNOSIS — I82592 Chronic embolism and thrombosis of other specified deep vein of left lower extremity: Secondary | ICD-10-CM

## 2017-05-16 DIAGNOSIS — I25708 Atherosclerosis of coronary artery bypass graft(s), unspecified, with other forms of angina pectoris: Secondary | ICD-10-CM

## 2017-05-16 DIAGNOSIS — F17210 Nicotine dependence, cigarettes, uncomplicated: Secondary | ICD-10-CM

## 2017-05-16 DIAGNOSIS — Z716 Tobacco abuse counseling: Secondary | ICD-10-CM

## 2017-05-16 DIAGNOSIS — I252 Old myocardial infarction: Secondary | ICD-10-CM | POA: Diagnosis not present

## 2017-05-16 DIAGNOSIS — E785 Hyperlipidemia, unspecified: Secondary | ICD-10-CM

## 2017-05-16 DIAGNOSIS — I10 Essential (primary) hypertension: Secondary | ICD-10-CM

## 2017-05-16 DIAGNOSIS — D649 Anemia, unspecified: Secondary | ICD-10-CM

## 2017-05-16 MED ORDER — VARENICLINE TARTRATE 0.5 MG X 11 & 1 MG X 42 PO MISC: ORAL | 0 refills | Status: DC

## 2017-05-16 NOTE — H&P (View-Only) (Signed)
SUBJECTIVE: The patient presents for routine follow-up.  She sustained a non-STEMI last year and ultimately underwent CABG on 02/21/17 with a LIMA to the LAD, SVG to the OM, and SVG to the PDA.  She has a history of PE and DVT and is on Xarelto 20 mg.  She also has hypertension, hyperlipidemia, and chronic anemia.  Hemoglobin 8 on 05/09/17.  She underwent a capsule endoscopy which showed 3 clear small bowel AVMs.  She was transfused 2 units on Monday and her energy levels have improved.  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  She also lost her bank card and she is worried about that and wonders if that is why her blood pressure is elevated.  She denies exertional chest pain and dyspnea.  She continues to smoke 1 pack of cigarettes daily.  We talked about cessation.  Echocardiogram 02/14/17: Normal left ventricular systolic function and regional wall motion, LVEF 44-73%, grade 2 diastolic dysfunction, mild mitral regurgitation.   Review of Systems: As per "subjective", otherwise negative.  Allergies  Allergen Reactions  . Bee Venom Anaphylaxis    Current Outpatient Medications  Medication Sig Dispense Refill  . allopurinol (ZYLOPRIM) 300 MG tablet Take 300 mg by mouth daily.    Marland Kitchen aspirin EC 81 MG EC tablet Take 1 tablet (81 mg total) by mouth daily.    Marland Kitchen atorvastatin (LIPITOR) 40 MG tablet Take 1 tablet (40 mg total) by mouth daily. 90 tablet 3  . calcium carbonate (TUMS - DOSED IN MG ELEMENTAL CALCIUM) 500 MG chewable tablet Chew 2 tablets by mouth 3 (three) times daily with meals.     . Cholecalciferol (VITAMIN D) 2000 units CAPS Take 2,000 Units by mouth daily.    . DULoxetine (CYMBALTA) 30 MG capsule Take 30 mg by mouth daily.    Marland Kitchen FeFum-FePoly-FA-B Cmp-C-Biot (INTEGRA PLUS) CAPS Take 1 capsule daily with a meal 30 capsule 3  . gabapentin (NEURONTIN) 600 MG tablet Take 300-600 mg by mouth See admin instructions. Take 300 mg twice  daily IN THE MORNING AND AT NOON AND  600 mg at bedtime.    Marland Kitchen lisinopril (PRINIVIL,ZESTRIL) 5 MG tablet Take 5 mg by mouth daily.    . metoprolol tartrate (LOPRESSOR) 25 MG tablet Take 0.5 tablets (12.5 mg total) by mouth 2 (two) times daily. 90 tablet 3  . Oxycodone HCl 10 MG TABS Take 10 mg by mouth 3 (three) times daily as needed. For pain    . ranitidine (ZANTAC) 150 MG tablet Take 1 tablet (150 mg total) by mouth 2 (two) times daily. 60 tablet 6  . rivaroxaban (XARELTO) 20 MG TABS tablet Take 20 mg by mouth daily with supper.     No current facility-administered medications for this visit.     Past Medical History:  Diagnosis Date  . Anxiety   . Arthritis   . CAD (coronary artery disease)    a. Cath 12/2014 - mild-moderate non-obstructive CAD with 60% mid RCA, 50% mid Cx-distal Cx, 30% distal LAD, 25% D2, EF 55-65%. b. 02/2017: NSTEMI with cath showing 60% LM stenosis and 100% RCA stenosis --> CT Surgery consulted with CABG on 02/21/2017 (LIMA-LAD, SVG-OM, and SVG-PDA)  . Depression   . Dizziness   . DVT (deep venous thrombosis) ([facility])    on Xarelto  . Dyspnea    W/ EXERTION  . GERD (gastroesophageal reflux disease)   . Headache   .  Heart murmur    DX   AT BIRTH  . Hyperlipidemia   . Hypertension   . 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 Neuropathy   . PE (pulmonary embolism)   . Tobacco abuse     Past Surgical History:  Procedure Laterality Date  . ABDOMINAL HYSTERECTOMY  1974  . CARDIAC CATHETERIZATION N/A 01/08/2015   Procedure: Left Heart Cath and Coronary Angiography;  Surgeon: Jolaine Artist, MD;  Location: [place];  Service: Cardiovascular;  Laterality: N/A;  . CARPAL TUNNEL RELEASE     RIGHT  . COLONOSCOPY WITH PROPOFOL N/A 02/16/2017   Procedure: COLONOSCOPY WITH PROPOFOL;  Surgeon: [name], MD;  [facility] ENDOSCOPY;  Service: Endoscopy;  Laterality: N/A;  . CORONARY ARTERY BYPASS GRAFT N/A 02/21/2017   Procedure: CORONARY  ARTERY BYPASS GRAFTING (CABG) times three. LIMA to LAD, SVG to OM and SVG to PDA;  Surgeon: Melrose Nakayama, MD;  Location: [facility];  Service: Open Heart Surgery;  Laterality: N/A;  . CORONARY STENT PLACEMENT Right 1980  . ESOPHAGOGASTRODUODENOSCOPY (EGD) WITH PROPOFOL N/A 02/16/2017   Procedure: ESOPHAGOGASTRODUODENOSCOPY (EGD) WITH PROPOFOL;  Surgeon: [name], MD;  Location: [facility] ENDOSCOPY;  Service: Endoscopy;  Laterality: N/A;  . GIVENS CAPSULE STUDY N/A 05/08/2017   Procedure: GIVENS CAPSULE STUDY;  Surgeon: [name], MD;  Location: [place];  Service: Endoscopy;  Laterality: N/A;  . KNEE ARTHROSCOPY W/ MENISCAL REPAIR     LEFT KNEE   FORMED DVT (SURG TO REMOVE BLOOD CLOT)  . LEFT HEART CATH AND CORONARY ANGIOGRAPHY N/A 02/19/2017   Procedure: LEFT HEART CATH AND CORONARY ANGIOGRAPHY;  Surgeon: Nelva Bush, MD;  Location: [place] [facility];  Service: Cardiovascular;  Laterality: N/A;  . TEE WITHOUT CARDIOVERSION N/A 02/21/2017   Procedure: TRANSESOPHAGEAL ECHOCARDIOGRAM (TEE);  Surgeon: Melrose Nakayama, MD;  Location: [place];  Service: Open Heart Surgery;  Laterality: N/A;  . TONSILLECTOMY      Social History   Socioeconomic History  . Marital status: Divorced    Spouse name: Not on file  . Number of children: 2  . Years of education: 22  . Highest education level: Not on file  Social Needs  . Financial resource strain: Not on file  . Food insecurity - worry: Not on file  . Food insecurity - inability: Not on file  . Transportation needs - medical: Not on file  . Transportation needs - non-medical: Not on file  Occupational History  . Occupation: Retired  Tobacco Use  . Smoking status: Current Every Day Smoker    Packs/day: 1.00    Years: 50.00    Pack years: 50.00    Types: Cigarettes    Start date: 06/18/1967  . Smokeless tobacco: Never Used  . Tobacco comment: smoked 3 cigarettes since sx  Substance and Sexual Activity  . Alcohol use: No      Alcohol/week: 0.0 oz  . Drug use: No  . Sexual activity: Not on file  Other Topics Concern  . Not on file  Social History Narrative   Lives at home alone.   Right-handed.   2 cups caffeine per day.     Vitals:   05/16/17 1053  BP: (!) 156/78  Pulse: 93  SpO2: 98%  Weight: 205 lb (93 kg)  Height: '5\' 4"'$  (1.626 m)    Wt Readings from Last 3 Encounters:  05/16/17 205 lb (93 kg)  05/02/17 206 lb 6  oz (93.6 kg)  04/12/17 207 lb 6.4 oz (94.1 kg)     PHYSICAL EXAM General: NAD HEENT: Normal. Neck: No JVD, no thyromegaly. Lungs: Clear to auscultation bilaterally with normal respiratory effort. CV: Regular rate and rhythm, normal S1/S2, no B5/M0, 1/6 systolic murmur loudest over right upper sternal border. No pretibial or periankle edema.  No carotid bruit.   Abdomen: Soft, nontender, no distention.  Neurologic: Alert and oriented.  Psych: Normal affect. Skin: Normal. Musculoskeletal: No gross deformities.    ECG: Most recent ECG reviewed.   Labs: Lab Results  Component Value Date/Time   K 3.5 04/03/2017 12:00 PM   BUN 9 04/03/2017 12:00 PM   CREATININE 0.88 04/03/2017 12:00 PM   ALT 16 02/13/2017 01:35 PM   TSH 1.480 06/20/2016 02:43 PM   HGB 8.0 Repeated and verified X2. (LL) 05/09/2017 10:11 AM     Lipids: Lab Results  Component Value Date/Time   LDLCALC 67 02/14/2017 08:41 AM   CHOL 129 02/14/2017 08:41 AM   TRIG 162 (H) 02/14/2017 08:41 AM   HDL 30 (L) 02/14/2017 08:41 AM       ASSESSMENT AND PLAN: 1.  Coronary disease with non-STEMI and three-vessel CABG in December 2018: Symptomatically stable.  Continue medical therapy with aspirin, Lipitor, lisinopril, and metoprolol. She is eager to participate in cardiac rehab.  2.  History of PE/DVT: Anticoagulated with Xarelto.  3.  Hypertension: Blood pressure is elevated.  She is anxious and stressed as she just lost her bank card.  I will continue to monitor this to see if further antihypertensive  titration is indicated.  4.  Hyperlipidemia: Currently on Lipitor 40 mg.  LDL 67 in December 2018.  5.  Chronic anemia: No evidence of active bleeding.  Hemoglobin 8 on 05/09/17.  She underwent a capsule endoscopy which showed 3 clear small bowel AVMs.  6.  Tobacco abuse: Cessation counseling provided (3 minutes).  I will prescribe a Chantix starter kit.   Disposition: Follow up 6 months   Kate Sable, M.D., F.A.C.C.

## 2017-05-16 NOTE — Progress Notes (Signed)
SUBJECTIVE: The patient presents for routine follow-up.  She sustained a non-STEMI last year and ultimately underwent CABG on 02/21/17 with a LIMA to the LAD, SVG to the OM, and SVG to the PDA.  She has a history of PE and DVT and is on Xarelto 20 mg.  She also has hypertension, hyperlipidemia, and chronic anemia.  Hemoglobin 8 on 05/09/17.  She underwent a capsule endoscopy which showed 3 clear small bowel AVMs.  She was transfused 2 units on Monday and her energy levels have improved.  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  She also lost her bank card and she is worried about that and wonders if that is why her blood pressure is elevated.  She denies exertional chest pain and dyspnea.  She continues to smoke 1 pack of cigarettes daily.  We talked about cessation.  Echocardiogram 02/14/17: Normal left ventricular systolic function and regional wall motion, LVEF 44-73%, grade 2 diastolic dysfunction, mild mitral regurgitation.   Review of Systems: As per "subjective", otherwise negative.  Allergies  Allergen Reactions  . Bee Venom Anaphylaxis    Current Outpatient Medications  Medication Sig Dispense Refill  . allopurinol (ZYLOPRIM) 300 MG tablet Take 300 mg by mouth daily.    Marland Kitchen aspirin EC 81 MG EC tablet Take 1 tablet (81 mg total) by mouth daily.    Marland Kitchen atorvastatin (LIPITOR) 40 MG tablet Take 1 tablet (40 mg total) by mouth daily. 90 tablet 3  . calcium carbonate (TUMS - DOSED IN MG ELEMENTAL CALCIUM) 500 MG chewable tablet Chew 2 tablets by mouth 3 (three) times daily with meals.     . Cholecalciferol (VITAMIN D) 2000 units CAPS Take 2,000 Units by mouth daily.    . DULoxetine (CYMBALTA) 30 MG capsule Take 30 mg by mouth daily.    Marland Kitchen FeFum-FePoly-FA-B Cmp-C-Biot (INTEGRA PLUS) CAPS Take 1 capsule daily with a meal 30 capsule 3  . gabapentin (NEURONTIN) 600 MG tablet Take 300-600 mg by mouth See admin instructions. Take 300 mg twice  daily IN THE MORNING AND AT NOON AND  600 mg at bedtime.    Marland Kitchen lisinopril (PRINIVIL,ZESTRIL) 5 MG tablet Take 5 mg by mouth daily.    . metoprolol tartrate (LOPRESSOR) 25 MG tablet Take 0.5 tablets (12.5 mg total) by mouth 2 (two) times daily. 90 tablet 3  . Oxycodone HCl 10 MG TABS Take 10 mg by mouth 3 (three) times daily as needed. For pain    . ranitidine (ZANTAC) 150 MG tablet Take 1 tablet (150 mg total) by mouth 2 (two) times daily. 60 tablet 6  . rivaroxaban (XARELTO) 20 MG TABS tablet Take 20 mg by mouth daily with supper.     No current facility-administered medications for this visit.     Past Medical History:  Diagnosis Date  . Anxiety   . Arthritis   . CAD (coronary artery disease)    a. Cath 12/2014 - mild-moderate non-obstructive CAD with 60% mid RCA, 50% mid Cx-distal Cx, 30% distal LAD, 25% D2, EF 55-65%. b. 02/2017: NSTEMI with cath showing 60% LM stenosis and 100% RCA stenosis --> CT Surgery consulted with CABG on 02/21/2017 (LIMA-LAD, SVG-OM, and SVG-PDA)  . Depression   . Dizziness   . DVT (deep venous thrombosis) (HCC)    on Xarelto  . Dyspnea    W/ EXERTION  . GERD (gastroesophageal reflux disease)   . Headache   .  Heart murmur    DX   AT BIRTH  . Hyperlipidemia   . Hypertension   . 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 Neuropathy   . PE (pulmonary embolism)   . Tobacco abuse     Past Surgical History:  Procedure Laterality Date  . ABDOMINAL HYSTERECTOMY  1974  . CARDIAC CATHETERIZATION N/A 01/08/2015   Procedure: Left Heart Cath and Coronary Angiography;  Surgeon: Jolaine Artist, MD;  Location: [place] [facility];  Service: Cardiovascular;  Laterality: N/A;  . CARPAL TUNNEL RELEASE     RIGHT  . COLONOSCOPY WITH PROPOFOL N/A 02/16/2017   Procedure: COLONOSCOPY WITH PROPOFOL;  Surgeon: [name], MD;  Location: [facility] ENDOSCOPY;  Service: Endoscopy;  Laterality: N/A;  . CORONARY ARTERY BYPASS GRAFT N/A 02/21/2017   Procedure: CORONARY  ARTERY BYPASS GRAFTING (CABG) times three. LIMA to LAD, SVG to OM and SVG to PDA;  Surgeon: Melrose Nakayama, MD;  Location: [place];  Service: Open Heart Surgery;  Laterality: N/A;  . CORONARY STENT PLACEMENT Right 1980  . ESOPHAGOGASTRODUODENOSCOPY (EGD) WITH PROPOFOL N/A 02/16/2017   Procedure: ESOPHAGOGASTRODUODENOSCOPY (EGD) WITH PROPOFOL;  Surgeon: [name], MD;  Location: [facility] ENDOSCOPY;  Service: Endoscopy;  Laterality: N/A;  . GIVENS CAPSULE STUDY N/A 05/08/2017   Procedure: GIVENS CAPSULE STUDY;  Surgeon: [name], MD;  Location: [place];  Service: Endoscopy;  Laterality: N/A;  . KNEE ARTHROSCOPY W/ MENISCAL REPAIR     LEFT KNEE   FORMED DVT (SURG TO REMOVE BLOOD CLOT)  . LEFT HEART CATH AND CORONARY ANGIOGRAPHY N/A 02/19/2017   Procedure: LEFT HEART CATH AND CORONARY ANGIOGRAPHY;  Surgeon: Nelva Bush, MD;  Location: [place] [facility];  Service: Cardiovascular;  Laterality: N/A;  . TEE WITHOUT CARDIOVERSION N/A 02/21/2017   Procedure: TRANSESOPHAGEAL ECHOCARDIOGRAM (TEE);  Surgeon: Melrose Nakayama, MD;  Location: [place];  Service: Open Heart Surgery;  Laterality: N/A;  . TONSILLECTOMY      Social History   Socioeconomic History  . Marital status: Divorced    Spouse name: Not on file  . Number of children: 2  . Years of education: 63  . Highest education level: Not on file  Social Needs  . Financial resource strain: Not on file  . Food insecurity - worry: Not on file  . Food insecurity - inability: Not on file  . Transportation needs - medical: Not on file  . Transportation needs - non-medical: Not on file  Occupational History  . Occupation: Retired  Tobacco Use  . Smoking status: Current Every Day Smoker    Packs/day: 1.00    Years: 50.00    Pack years: 50.00    Types: Cigarettes    Start date: 06/18/1967  . Smokeless tobacco: Never Used  . Tobacco comment: smoked 3 cigarettes since sx  Substance and Sexual Activity  . Alcohol use: No      Alcohol/week: 0.0 oz  . Drug use: No  . Sexual activity: Not on file  Other Topics Concern  . Not on file  Social History Narrative   Lives at home alone.   Right-handed.   2 cups caffeine per day.     Vitals:   05/16/17 1053  BP: (!) 156/78  Pulse: 93  SpO2: 98%  Weight: 205 lb (93 kg)  Height: '5\' 4"'$  (1.626 m)    Wt Readings from Last 3 Encounters:  05/16/17 205 lb (93 kg)  05/02/17 206 lb 6  oz (93.6 kg)  04/12/17 207 lb 6.4 oz (94.1 kg)     PHYSICAL EXAM General: NAD HEENT: Normal. Neck: No JVD, no thyromegaly. Lungs: Clear to auscultation bilaterally with normal respiratory effort. CV: Regular rate and rhythm, normal S1/S2, no B5/M0, 1/6 systolic murmur loudest over right upper sternal border. No pretibial or periankle edema.  No carotid bruit.   Abdomen: Soft, nontender, no distention.  Neurologic: Alert and oriented.  Psych: Normal affect. Skin: Normal. Musculoskeletal: No gross deformities.    ECG: Most recent ECG reviewed.   Labs: Lab Results  Component Value Date/Time   K 3.5 04/03/2017 12:00 PM   BUN 9 04/03/2017 12:00 PM   CREATININE 0.88 04/03/2017 12:00 PM   ALT 16 02/13/2017 01:35 PM   TSH 1.480 06/20/2016 02:43 PM   HGB 8.0 Repeated and verified X2. (LL) 05/09/2017 10:11 AM     Lipids: Lab Results  Component Value Date/Time   LDLCALC 67 02/14/2017 08:41 AM   CHOL 129 02/14/2017 08:41 AM   TRIG 162 (H) 02/14/2017 08:41 AM   HDL 30 (L) 02/14/2017 08:41 AM       ASSESSMENT AND PLAN: 1.  Coronary disease with non-STEMI and three-vessel CABG in December 2018: Symptomatically stable.  Continue medical therapy with aspirin, Lipitor, lisinopril, and metoprolol. She is eager to participate in cardiac rehab.  2.  History of PE/DVT: Anticoagulated with Xarelto.  3.  Hypertension: Blood pressure is elevated.  She is anxious and stressed as she just lost her bank card.  I will continue to monitor this to see if further antihypertensive  titration is indicated.  4.  Hyperlipidemia: Currently on Lipitor 40 mg.  LDL 67 in December 2018.  5.  Chronic anemia: No evidence of active bleeding.  Hemoglobin 8 on 05/09/17.  She underwent a capsule endoscopy which showed 3 clear small bowel AVMs.  6.  Tobacco abuse: Cessation counseling provided (3 minutes).  I will prescribe a Chantix starter kit.   Disposition: Follow up 6 months   Kate Sable, M.D., F.A.C.C.

## 2017-05-16 NOTE — Patient Instructions (Signed)
Medication Instructions:  START CHANTIX STARTER PACK- TAKE AS DIRECTED   Labwork: NONE  Testing/Procedures: NONE  Follow-Up: Your physician wants you to follow-up in: 6 MONTHS.  You will receive a reminder letter in the mail two months in advance. If you don't receive a letter, please call our office to schedule the follow-up appointment.   Any Other Special Instructions Will Be Listed Below (If Applicable).     If you need a refill on your cardiac medications before your next appointment, please call your pharmacy.

## 2017-05-17 ENCOUNTER — Other Ambulatory Visit: Payer: Self-pay

## 2017-05-17 ENCOUNTER — Encounter: Payer: Self-pay | Admitting: Family

## 2017-05-17 ENCOUNTER — Encounter: Payer: Medicare Other | Admitting: Family

## 2017-05-17 ENCOUNTER — Ambulatory Visit (HOSPITAL_COMMUNITY)
Admission: RE | Admit: 2017-05-17 | Discharge: 2017-05-17 | Disposition: A | Discharge status: Home / Self Care | Payer: Medicare Other | Source: Ambulatory Visit | Attending: Vascular Surgery | Admitting: Vascular Surgery

## 2017-05-17 VITALS — BP 117/66 | HR 84 | Resp 18 | Ht 64.0 in | Wt 206.1 lb

## 2017-05-17 DIAGNOSIS — I6521 Occlusion and stenosis of right carotid artery: Secondary | ICD-10-CM | POA: Diagnosis not present

## 2017-05-17 DIAGNOSIS — I6523 Occlusion and stenosis of bilateral carotid arteries: Secondary | ICD-10-CM | POA: Insufficient documentation

## 2017-05-17 DIAGNOSIS — Z8673 Personal history of transient ischemic attack (TIA), and cerebral infarction without residual deficits: Secondary | ICD-10-CM

## 2017-05-17 NOTE — Telephone Encounter (Signed)
Richardson Landry, thanks  [place], She needs enteroscopy at [facility] next available Thursday for anemia, small bowel AVMs.  CBC next week as well.  Thanks

## 2017-05-17 NOTE — Telephone Encounter (Signed)
Left message on machine to call back  

## 2017-05-17 NOTE — Patient Instructions (Signed)

## 2017-05-17 NOTE — Addendum Note (Signed)
Encounter addended by: Dwana Melena, RN on: 05/17/2017 3:59 PM  Actions taken: Visit Navigator Flowsheet section accepted, Sign clinical note, Episode resolved

## 2017-05-17 NOTE — Progress Notes (Signed)
  Patient left before I could evaluate him.   DATA  Carotid Duplex (05-17-17): Right Carotid: Velocities in the right ICA are consistent with a 40-59%        stenosis. Highest velocities are distal to the stent.  Left Carotid: Velocities in the left ICA are consistent with a 40-59% stenosis.  Vertebrals: Both vertebral arteries were patent with antegrade flow. Subclavians: Normal flow hemodynamics were seen in bilateral subclavian       arteries. Prior examination on 11/18/2016 revealed 40-59% RICA stenosis          and 79-02 % LICA stenosis.

## 2017-05-17 NOTE — Progress Notes (Signed)
Cardiac Individual Treatment Plan  Patient Details  Name: Jennifer [name] MRN: [id number] Date of Birth: September 22, 1950 Referring Provider:     [facility] from 04/12/2017 in [place]  Referring Provider  Dr. Bronson Ing      Initial Encounter Date:    CARDIAC REHAB PHASE II ORIENTATION from 04/12/2017 in [facility]  Date  04/12/17  Referring Provider  Dr. Bronson Ing      Visit Diagnosis: NSTEMI (non-ST elevated myocardial infarction) ([facility])  S/P CABG x 3  Patient's Home Medications on Admission:  Current Outpatient Medications:  .  allopurinol (ZYLOPRIM) 300 MG tablet, Take 300 mg by mouth daily., Disp: , Rfl:  .  aspirin EC 81 MG EC tablet, Take 1 tablet (81 mg total) by mouth daily., Disp: , Rfl:  .  atorvastatin (LIPITOR) 40 MG tablet, Take 1 tablet (40 mg total) by mouth daily., Disp: 90 tablet, Rfl: 3 .  calcium carbonate (TUMS - DOSED IN MG ELEMENTAL CALCIUM) 500 MG chewable tablet, Chew 2 tablets by mouth 3 (three) times daily with meals. , Disp: , Rfl:  .  Cholecalciferol (VITAMIN D) 2000 units CAPS, Take 2,000 Units by mouth daily., Disp: , Rfl:  .  DULoxetine (CYMBALTA) 30 MG capsule, Take 30 mg by mouth daily., Disp: , Rfl:  .  FeFum-FePoly-FA-B Cmp-C-Biot (INTEGRA PLUS) CAPS, Take 1 capsule daily with a meal, Disp: 30 capsule, Rfl: 3 .  gabapentin (NEURONTIN) 600 MG tablet, Take 300-600 mg by mouth See admin instructions. Take 300 mg twice daily IN THE MORNING AND AT NOON AND  600 mg at bedtime., Disp: , Rfl:  .  lisinopril (PRINIVIL,ZESTRIL) 5 MG tablet, Take 5 mg by mouth daily., Disp: , Rfl:  .  metoprolol tartrate (LOPRESSOR) 25 MG tablet, Take 0.5 tablets (12.5 mg total) by mouth 2 (two) times daily., Disp: 90 tablet, Rfl: 3 .  Oxycodone HCl 10 MG TABS, Take 10 mg by mouth 3 (three) times daily as needed. For pain, Disp: , Rfl:  .  ranitidine (ZANTAC) 150 MG tablet, Take 1 tablet (150 mg total) by mouth  2 (two) times daily., Disp: 60 tablet, Rfl: 6 .  rivaroxaban (XARELTO) 20 MG TABS tablet, Take 20 mg by mouth daily with supper., Disp: , Rfl:  .  varenicline (CHANTIX STARTING MONTH PAK) 0.5 MG X 11 & 1 MG X 42 tablet, Take one 0.5 mg tablet by mouth once daily for 3 days, then increase to one 0.5 mg tablet twice daily for 4 days, then increase to one 1 mg tablet twice daily., Disp: 53 tablet, Rfl: 0  Past Medical History: Past Medical History:  Diagnosis Date  . Anxiety   . Arthritis   . CAD (coronary artery disease)    a. Cath 12/2014 - mild-moderate non-obstructive CAD with 60% mid RCA, 50% mid Cx-distal Cx, 30% distal LAD, 25% D2, EF 55-65%. b. 02/2017: NSTEMI with cath showing 60% LM stenosis and 100% RCA stenosis --> CT Surgery consulted with CABG on 02/21/2017 (LIMA-LAD, SVG-OM, and SVG-PDA)  . Depression   . Dizziness   . DVT (deep venous thrombosis) ([facility])    on Xarelto  . Dyspnea    W/ EXERTION  . GERD (gastroesophageal reflux disease)   . Headache   . Heart murmur    DX   AT BIRTH  . Hyperlipidemia   . Hypertension   . 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 Myocardial infarction ([facility])   .  Neuropathy   . PE (pulmonary embolism)   . Tobacco abuse     Tobacco Use: Social History   Tobacco Use  Smoking Status Current Every Day Smoker  . Packs/day: 1.00  . Years: 50.00  . Pack years: 50.00  . Types: Cigarettes  . Start date: 06/18/1967  Smokeless Tobacco Never Used  Tobacco Comment   smoked 3 cigarettes since sx    Labs: Recent Review Flowsheet Data    Labs for ITP Cardiac and Pulmonary Rehab Latest Ref Rng & Units 02/21/2017 02/21/2017 02/21/2017 02/21/2017 02/22/2017   Cholestrol 0 - 200 mg/dL - - - - -   LDLCALC 0 - 99 mg/dL - - - - -   HDL >40 mg/dL - - - - -   Trlycerides <150 mg/dL - - - - -   Hemoglobin A1c 4.8 - 5.6 % - - - - -   PHART 7.350 - 7.450 7.379 7.334(L) - - -   PCO2ART 32.0 - 48.0 mmHg 45.8 43.4 - - -   HCO3 20.0 - 28.0 mmol/L  27.1 23.1 - - -   TCO2 22 - 32 mmol/L 28 24 28 26 24    ACIDBASEDEF 0.0 - 2.0 mmol/L - 3.0(H) - - -   O2SAT % 97.0 95.0 - - -      Capillary Blood Glucose: Lab Results  Component Value Date   GLUCAP 97 02/25/2017   GLUCAP 122 (H) 02/24/2017   GLUCAP 104 (H) 02/24/2017   GLUCAP 92 02/24/2017   GLUCAP 129 (H) 02/24/2017     Exercise Target Goals:    Exercise Program Goal: Individual exercise prescription set using results from initial 6 min walk test and THRR while considering  patient's activity barriers and safety.   Exercise Prescription Goal: Starting with aerobic activity 30 plus minutes a day, 3 days per week for initial exercise prescription. Provide home exercise prescription and guidelines that participant acknowledges understanding prior to discharge.  Activity Barriers & Risk Stratification: Activity Barriers & Cardiac Risk Stratification - 04/12/17 0954      Activity Barriers & Cardiac Risk Stratification   Activity Barriers  Deconditioning;Balance Concerns    Cardiac Risk Stratification  High       6 Minute Walk: 6 Minute Walk    Row Name 04/12/17 0952         6 Minute Walk   Phase  Initial     Distance  1200 feet     Distance % Change  0 %     Distance Feet Change  0 ft     Walk Time  6 minutes     # of Rest Breaks  0     MPH  2.27     METS  2.74     RPE  13     Perceived Dyspnea   13     VO2 Peak  9.39     Symptoms  No     Resting HR  71 bpm     Resting BP  126/58     Resting Oxygen Saturation   96 %     Exercise Oxygen Saturation  during 6 min walk  93 %     Max Ex. HR  117 bpm     Max Ex. BP  144/62     2 Minute Post BP  132/58        Oxygen Initial Assessment:   Oxygen Re-Evaluation:   Oxygen Discharge (Final Oxygen Re-Evaluation):   Initial Exercise Prescription:  Initial Exercise Prescription - 04/12/17 0900      Date of Initial Exercise RX and Referring Provider   Date  04/12/17    Referring Provider  Dr. Bronson Ing       Treadmill   MPH  1    Grade  0    Minutes  15    METs  1.7      NuStep   Level  2    SPM  55    Minutes  20    METs  1.5      Prescription Details   Frequency (times per week)  3    Duration  Progress to 30 minutes of continuous aerobic without signs/symptoms of physical distress      Intensity   THRR 40-80% of Max Heartrate  104-120-137    Ratings of Perceived Exertion  11-13    Perceived Dyspnea  0-4      Progression   Progression  Continue progressive overload as per policy without signs/symptoms or physical distress.      Resistance Training   Training Prescription  Yes    Weight  1    Reps  10-15       Perform Capillary Blood Glucose checks as needed.  Exercise Prescription Changes:  Exercise Prescription Changes    Row Name 04/16/17 1400 04/23/17 1400           Response to Exercise   Blood Pressure (Admit)  -  100/40      Blood Pressure (Exercise)  -  120/65      Blood Pressure (Exit)  -  90/64      Heart Rate (Admit)  -  80 bpm      Heart Rate (Exercise)  -  91 bpm      Heart Rate (Exit)  -  87 bpm      Rating of Perceived Exertion (Exercise)  -  13      Duration  -  Progress to 30 minutes of  aerobic without signs/symptoms of physical distress      Intensity  -  THRR New [phone]        Progression   Progression  -  Continue to progress workloads to maintain intensity without signs/symptoms of physical distress.        Resistance Training   Training Prescription  Yes  Yes      Weight  1  1      Reps  10-15  10-15        Treadmill   MPH  1  -      Grade  0  -      Minutes  15  -      METs  1.7  -        NuStep   Level  2  2      SPM  55  72      Minutes  20  20      METs  1.5  1.7        Arm Ergometer   Level  -  1.5      Watts  -  11      Minutes  -  20      METs  -  2.1        Home Exercise Plan   Plans to continue exercise at  Home (comment)  Home (comment)      Frequency  Add 2 additional days to program exercise  sessions.  Add 2 additional days to program exercise sessions.      Initial Home Exercises Provided  04/16/17  04/16/17         Exercise Comments:  Exercise Comments    Row Name 04/16/17 1422 04/23/17 1427 05/10/17 1504       Exercise Comments  Patient recieved the take home exercise plan today 04/16/2017. THR was addressed as was safety guidelines for being active when not in CR. Patient demonstrated an understanding and was encouraged to ask any future questions that may arise.   Patient was taken off of the treadmill due to dizziness and fall risk. Patient has not returned to CR yet. Patient is awaiting visit with her doctor before returning to CR,. No progression has been made.   Patient has not yet returned to CR due to sickness and is waiting results from the doctors.         Exercise Goals and Review:  Exercise Goals    Row Name 04/12/17 0955             Exercise Goals   Increase Physical Activity  Yes       Intervention  Provide advice, education, support and counseling about physical activity/exercise needs.;Develop an individualized exercise prescription for aerobic and resistive training based on initial evaluation findings, risk stratification, comorbidities and participant's personal goals.       Expected Outcomes  Short Term: Attend rehab on a regular basis to increase amount of physical activity.       Increase Strength and Stamina  Yes       Intervention  Provide advice, education, support and counseling about physical activity/exercise needs.;Develop an individualized exercise prescription for aerobic and resistive training based on initial evaluation findings, risk stratification, comorbidities and participant's personal goals.       Expected Outcomes  Short Term: Increase workloads from initial exercise prescription for resistance, speed, and METs.       Able to understand and use rate of perceived exertion (RPE) scale  Yes       Intervention  Provide education and  explanation on how to use RPE scale       Expected Outcomes  Short Term: Able to use RPE daily in rehab to express subjective intensity level;Long Term:  Able to use RPE to guide intensity level when exercising independently       Able to understand and use Dyspnea scale  Yes       Intervention  Provide education and explanation on how to use Dyspnea scale       Expected Outcomes  Long Term: Able to use Dyspnea scale to guide intensity level when exercising independently;Short Term: Able to use Dyspnea scale daily in rehab to express subjective sense of shortness of breath during exertion       Knowledge and understanding of Target Heart Rate Range (THRR)  Yes       Intervention  Provide education and explanation of THRR including how the numbers were predicted and where they are located for reference       Expected Outcomes  Short Term: Able to state/look up THRR       Able to check pulse independently  Yes       Intervention  Provide education and demonstration on how to check pulse in carotid and radial arteries.;Review the importance of being able to check your own pulse for safety during independent exercise       Expected Outcomes  Short Term: Able to explain  why pulse checking is important during independent exercise;Long Term: Able to check pulse independently and accurately       Understanding of Exercise Prescription  Yes       Intervention  Provide education, explanation, and written materials on patient's individual exercise prescription       Expected Outcomes  Short Term: Able to explain program exercise prescription;Long Term: Able to explain home exercise prescription to exercise independently          Exercise Goals Re-Evaluation : Exercise Goals Re-Evaluation    Row Name 04/23/17 1426             Exercise Goal Re-Evaluation   Exercise Goals Review  Increase Physical Activity;Increase Strength and Stamina;Knowledge and understanding of Target Heart Rate Range (THRR)        Comments  Patient was taken off of the treadmill due to dizziness and fall risk. Patient has not returned to CR yet. Patient is awaiting visit with her doctor before returning to CR,. No progression has been made.        Expected Outcomes  Patient wishes to be able to exercise at a pace for heart benefit and to get stronger and to live healthier.            Discharge Exercise Prescription (Final Exercise Prescription Changes): Exercise Prescription Changes - 04/23/17 1400      Response to Exercise   Blood Pressure (Admit)  100/40    Blood Pressure (Exercise)  120/65    Blood Pressure (Exit)  90/64    Heart Rate (Admit)  80 bpm    Heart Rate (Exercise)  91 bpm    Heart Rate (Exit)  87 bpm    Rating of Perceived Exertion (Exercise)  13    Duration  Progress to 30 minutes of  aerobic without signs/symptoms of physical distress    Intensity  THRR New [phone]      Progression   Progression  Continue to progress workloads to maintain intensity without signs/symptoms of physical distress.      Resistance Training   Training Prescription  Yes    Weight  1    Reps  10-15      NuStep   Level  2    SPM  72    Minutes  20    METs  1.7      Arm Ergometer   Level  1.5    Watts  11    Minutes  20    METs  2.1      Home Exercise Plan   Plans to continue exercise at  Home (comment)    Frequency  Add 2 additional days to program exercise sessions.    Initial Home Exercises Provided  04/16/17       Nutrition:  Target Goals: Understanding of nutrition guidelines, daily intake of sodium 1500mg , cholesterol 200mg , calories 30% from fat and 7% or less from saturated fats, daily to have 5 or more servings of fruits and vegetables.  Biometrics: Pre Biometrics - 04/12/17 0955      Pre Biometrics   Height  5\' 4"  (1.626 m)    Weight  207 lb 6.4 oz (94.1 kg)    Waist Circumference  42.75 inches    Hip Circumference  47 inches    Waist to Hip Ratio  0.91 %    BMI (Calculated)   35.58    Triceps Skinfold  17 mm    % Body Fat  43.2 %  Grip Strength  56.4 kg    Flexibility  0 in LBP    Single Leg Stand  16 seconds        Nutrition Therapy Plan and Nutrition Goals: Nutrition Therapy & Goals - 04/12/17 1010      Personal Nutrition Goals   Personal Goal #2  Patient is following a heart healthy diet.    Additional Goals?  No       Nutrition Assessments: Nutrition Assessments - 04/12/17 1011      MEDFICTS Scores   Pre Score  18       Nutrition Goals Re-Evaluation:   Nutrition Goals Discharge (Final Nutrition Goals Re-Evaluation):   Psychosocial: Target Goals: Acknowledge presence or absence of significant depression and/or stress, maximize coping skills, provide positive support system. Participant is able to verbalize types and ability to use techniques and skills needed for reducing stress and depression.  Initial Review & Psychosocial Screening: Initial Psych Review & Screening - 04/12/17 1015      Initial Review   Current issues with  History of Depression      Family Dynamics   Good Support System?  No    Concerns  Inappropriate over/under dependence on family/friends;No support system See feels her friends are supportive, her family is not.       Barriers   Psychosocial barriers to participate in program  Psychosocial barriers identified (see note)      Screening Interventions   Interventions  Encouraged to exercise;Provide feedback about the scores to participant    Expected Outcomes  Short Term goal: Identification and review with participant of any Quality of Life or Depression concerns found by scoring the questionnaire.;Long Term goal: The participant improves quality of Life and PHQ9 Scores as seen by post scores and/or verbalization of changes       Quality of Life Scores: Quality of Life - 04/12/17 0956      Quality of Life Scores   Health/Function Pre  21.5 %    Socioeconomic Pre  24 %    Psych/Spiritual Pre  19.86 %     Family Pre  6 %    GLOBAL Pre  19.77 %      Scores of 19 and below usually indicate a poorer quality of life in these areas.  A difference of  2-3 points is a clinically meaningful difference.  A difference of 2-3 points in the total score of the Quality of Life Index has been associated with significant improvement in overall quality of life, self-image, physical symptoms, and general health in studies assessing change in quality of life.  PHQ-9: Recent Review Flowsheet Data    Depression screen [facility] 2/9 04/12/2017   Decreased Interest 0   Down, Depressed, Hopeless 0   PHQ - 2 Score 0   Altered sleeping 1   Tired, decreased energy 1   Change in appetite 1   Feeling bad or failure about yourself  0   Trouble concentrating 0   Moving slowly or fidgety/restless 0   Suicidal thoughts 0   PHQ-9 Score 3   Difficult doing work/chores Not difficult at all     Interpretation of Total Score  Total Score Depression Severity:  1-4 = Minimal depression, 5-9 = Mild depression, 10-14 = Moderate depression, 15-19 = Moderately severe depression, 20-27 = Severe depression   Psychosocial Evaluation and Intervention: Psychosocial Evaluation - 04/12/17 1019      Psychosocial Evaluation & Interventions   Interventions  Encouraged to exercise with the  program and follow exercise prescription    Continue Psychosocial Services   Follow up required by staff       Psychosocial Re-Evaluation:   Psychosocial Discharge (Final Psychosocial Re-Evaluation):   Vocational Rehabilitation: Provide vocational rehab assistance to qualifying candidates.   Vocational Rehab Evaluation & Intervention: Vocational Rehab - 04/12/17 1006      Initial Vocational Rehab Evaluation & Intervention   Assessment shows need for Vocational Rehabilitation  No       Education: Education Goals: Education classes will be provided on a weekly basis, covering required topics. Participant will state understanding/return  demonstration of topics presented.  Learning Barriers/Preferences: Learning Barriers/Preferences - 04/12/17 0954      Learning Barriers/Preferences   Learning Barriers  None    Learning Preferences  Pictoral;Skilled Demonstration;Video;Written Material;Individual Instruction;Group Instruction;Computer/Internet       Education Topics: Hypertension, Hypertension Reduction -Define heart disease and high blood pressure. Discus how high blood pressure affects the body and ways to reduce high blood pressure.   CARDIAC REHAB PHASE II EXERCISE from 04/16/2017 in [facility]  Date  04/18/17  Educator  DC  Instruction Review Code  2- Demonstrated Understanding      Exercise and Your Heart -Discuss why it is important to exercise, the FITT principles of exercise, normal and abnormal responses to exercise, and how to exercise safely.   Angina -Discuss definition of angina, causes of angina, treatment of angina, and how to decrease risk of having angina.   Cardiac Medications -Review what the following cardiac medications are used for, how they affect the body, and side effects that may occur when taking the medications.  Medications include Aspirin, Beta blockers, calcium channel blockers, ACE Inhibitors, angiotensin receptor blockers, diuretics, digoxin, and antihyperlipidemics.   Congestive Heart Failure -Discuss the definition of CHF, how to live with CHF, the signs and symptoms of CHF, and how keep track of weight and sodium intake.   Heart Disease and Intimacy -Discus the effect sexual activity has on the heart, how changes occur during intimacy as we age, and safety during sexual activity.   Smoking Cessation / COPD -Discuss different methods to quit smoking, the health benefits of quitting smoking, and the definition of COPD.   Nutrition I: Fats -Discuss the types of cholesterol, what cholesterol does to the heart, and how cholesterol levels can be  controlled.   Nutrition II: Labels -Discuss the different components of food labels and how to read food label   Heart Parts/Heart Disease and PAD -Discuss the anatomy of the heart, the pathway of blood circulation through the heart, and these are affected by heart disease.   Stress I: Signs and Symptoms -Discuss the causes of stress, how stress may lead to anxiety and depression, and ways to limit stress.   Stress II: Relaxation -Discuss different types of relaxation techniques to limit stress.   Warning Signs of Stroke / TIA -Discuss definition of a stroke, what the signs and symptoms are of a stroke, and how to identify when someone is having stroke.   Knowledge Questionnaire Score: Knowledge Questionnaire Score - 04/12/17 0954      Knowledge Questionnaire Score   Pre Score  26/28       Core Components/Risk Factors/Patient Goals at Admission: Personal Goals and Risk Factors at Admission - 04/12/17 1011      Core Components/Risk Factors/Patient Goals on Admission    Weight Management  Yes    Admit Weight  207 lb (93.9 kg)  Goal Weight: Short Term  202 lb (91.6 kg)    Goal Weight: Long Term  192 lb (87.1 kg)    Expected Outcomes  Short Term: Continue to assess and modify interventions until short term weight is achieved;Long Term: Adherence to nutrition and physical activity/exercise program aimed toward attainment of established weight goal    Tobacco Cessation  Yes    Intervention  Assist the participant in steps to quit. Provide individualized education and counseling about committing to Tobacco Cessation, relapse prevention, and pharmacological support that can be provided by physician.;Advice worker, assist with locating and accessing local/national Quit Smoking programs, and support quit date choice.    Personal Goal Other  Yes    Personal Goal  Learn how to exercise at a pace for heart benefits. Get stonger, Lose 27lbs, and live a healthier life.      Intervention  Attend 3 x week and supplement with at home exercise 2 x week. Also attend smoking cessation classes offered at [facility].     Expected Outcomes  Reach goals       Core Components/Risk Factors/Patient Goals Review:    Core Components/Risk Factors/Patient Goals at Discharge (Final Review):    ITP Comments: ITP Comments    Row Name 04/12/17 1006 04/23/17 1316 05/17/17 1552       ITP Comments  Ms. Sockwell is a plesant 1 yr. old female reovering from CABGx3. She is still smoking cigaretts 1/2 ppd. Smoking Cessation schedule was given to patient.   Patient new to the program completing 2 sessions. She is currently on hold until she sees a gastroenterologist on Wednesday to be evaluated for anemia. Her last hgb was 8.4 on 04/17/17.  She complains of chronic dizziness and weakness. Will continue to monitor.   Patient stopped attending after completing 2 session due anemia. She is being seen by a gastroenterologist. MD will be notified.         Comments: Patient stopped coming to Cardiac Rehab on  04/16/17 after completing 2 sessions due to anemia. She is being seen by a gastroenerologist. Called patient to remind them of the Cardiac Rehab policy that if they do not call or come for 3 consecutive visits that they would be discharged from the CR program. Doctor will be informed.

## 2017-05-17 NOTE — Progress Notes (Signed)
Discharge Progress Report  Patient Details  Name: Jennifer [name] MRN: [id number] Date of Birth: 1950-09-12 Referring Provider:     CARDIAC REHAB PHASE II ORIENTATION from 04/12/2017 in [facility]  Referring Provider  Dr. Bronson Ing       Number of Visits: 2  Reason for Discharge:  Early Exit:  Acute/Chronic illness.   Smoking History:  Social History   Tobacco Use  Smoking Status Current Every Day Smoker  . Packs/day: 1.00  . Years: 50.00  . Pack years: 50.00  . Types: Cigarettes  . Start date: 06/18/1967  Smokeless Tobacco Never Used  Tobacco Comment   smoked 3 cigarettes since sx    Diagnosis:  NSTEMI (non-ST elevated myocardial infarction) ([place])  S/P CABG x 3  ADL UCSD:   Initial Exercise Prescription: Initial Exercise Prescription - 04/12/17 0900      Date of Initial Exercise RX and Referring Provider   Date  04/12/17    Referring Provider  Dr. Bronson Ing      Treadmill   MPH  1    Grade  0    Minutes  15    METs  1.7      NuStep   Level  2    SPM  55    Minutes  20    METs  1.5      Prescription Details   Frequency (times per week)  3    Duration  Progress to 30 minutes of continuous aerobic without signs/symptoms of physical distress      Intensity   THRR 40-80% of Max Heartrate  104-120-137    Ratings of Perceived Exertion  11-13    Perceived Dyspnea  0-4      Progression   Progression  Continue progressive overload as per policy without signs/symptoms or physical distress.      Resistance Training   Training Prescription  Yes    Weight  1    Reps  10-15       Discharge Exercise Prescription (Final Exercise Prescription Changes): Exercise Prescription Changes - 04/23/17 1400      Response to Exercise   Blood Pressure (Admit)  100/40    Blood Pressure (Exercise)  120/65    Blood Pressure (Exit)  90/64    Heart Rate (Admit)  80 bpm    Heart Rate (Exercise)  91 bpm    Heart Rate (Exit)  87 bpm    Rating of  Perceived Exertion (Exercise)  13    Duration  Progress to 30 minutes of  aerobic without signs/symptoms of physical distress    Intensity  THRR New [phone]      Progression   Progression  Continue to progress workloads to maintain intensity without signs/symptoms of physical distress.      Resistance Training   Training Prescription  Yes    Weight  1    Reps  10-15      NuStep   Level  2    SPM  72    Minutes  20    METs  1.7      Arm Ergometer   Level  1.5    Watts  11    Minutes  20    METs  2.1      Home Exercise Plan   Plans to continue exercise at  Home (comment)    Frequency  Add 2 additional days to program exercise sessions.    Initial Home Exercises Provided  04/16/17  Functional Capacity: 6 Minute Walk    Row Name 04/12/17 0952         6 Minute Walk   Phase  Initial     Distance  1200 feet     Distance % Change  0 %     Distance Feet Change  0 ft     Walk Time  6 minutes     # of Rest Breaks  0     MPH  2.27     METS  2.74     RPE  13     Perceived Dyspnea   13     VO2 Peak  9.39     Symptoms  No     Resting HR  71 bpm     Resting BP  126/58     Resting Oxygen Saturation   96 %     Exercise Oxygen Saturation  during 6 min walk  93 %     Max Ex. HR  117 bpm     Max Ex. BP  144/62     2 Minute Post BP  132/58        Psychological, QOL, Others - Outcomes: PHQ 2/9: Depression screen PHQ 2/9 04/12/2017  Decreased Interest 0  Down, Depressed, Hopeless 0  PHQ - 2 Score 0  Altered sleeping 1  Tired, decreased energy 1  Change in appetite 1  Feeling bad or failure about yourself  0  Trouble concentrating 0  Moving slowly or fidgety/restless 0  Suicidal thoughts 0  PHQ-9 Score 3  Difficult doing work/chores Not difficult at all    Quality of Life: Quality of Life - 04/12/17 0956      Quality of Life Scores   Health/Function Pre  21.5 %    Socioeconomic Pre  24 %    Psych/Spiritual Pre  19.86 %    Family Pre  6 %    GLOBAL  Pre  19.77 %       Personal Goals: Goals established at orientation with interventions provided to work toward goal. Personal Goals and Risk Factors at Admission - 04/12/17 1011      Core Components/Risk Factors/Patient Goals on Admission    Weight Management  Yes    Admit Weight  207 lb (93.9 kg)    Goal Weight: Short Term  202 lb (91.6 kg)    Goal Weight: Long Term  192 lb (87.1 kg)    Expected Outcomes  Short Term: Continue to assess and modify interventions until short term weight is achieved;Long Term: Adherence to nutrition and physical activity/exercise program aimed toward attainment of established weight goal    Tobacco Cessation  Yes    Intervention  Assist the participant in steps to quit. Provide individualized education and counseling about committing to Tobacco Cessation, relapse prevention, and pharmacological support that can be provided by physician.;Advice worker, assist with locating and accessing local/national Quit Smoking programs, and support quit date choice.    Personal Goal Other  Yes    Personal Goal  Learn how to exercise at a pace for heart benefits. Get stonger, Lose 27lbs, and live a healthier life.     Intervention  Attend 3 x week and supplement with at home exercise 2 x week. Also attend smoking cessation classes offered at [facility].     Expected Outcomes  Reach goals        Personal Goals Discharge:   Exercise Goals and Review: Exercise Goals    Row Name  04/12/17 0955             Exercise Goals   Increase Physical Activity  Yes       Intervention  Provide advice, education, support and counseling about physical activity/exercise needs.;Develop an individualized exercise prescription for aerobic and resistive training based on initial evaluation findings, risk stratification, comorbidities and participant's personal goals.       Expected Outcomes  Short Term: Attend rehab on a regular basis to increase amount of physical activity.        Increase Strength and Stamina  Yes       Intervention  Provide advice, education, support and counseling about physical activity/exercise needs.;Develop an individualized exercise prescription for aerobic and resistive training based on initial evaluation findings, risk stratification, comorbidities and participant's personal goals.       Expected Outcomes  Short Term: Increase workloads from initial exercise prescription for resistance, speed, and METs.       Able to understand and use rate of perceived exertion (RPE) scale  Yes       Intervention  Provide education and explanation on how to use RPE scale       Expected Outcomes  Short Term: Able to use RPE daily in rehab to express subjective intensity level;Long Term:  Able to use RPE to guide intensity level when exercising independently       Able to understand and use Dyspnea scale  Yes       Intervention  Provide education and explanation on how to use Dyspnea scale       Expected Outcomes  Long Term: Able to use Dyspnea scale to guide intensity level when exercising independently;Short Term: Able to use Dyspnea scale daily in rehab to express subjective sense of shortness of breath during exertion       Knowledge and understanding of Target Heart Rate Range (THRR)  Yes       Intervention  Provide education and explanation of THRR including how the numbers were predicted and where they are located for reference       Expected Outcomes  Short Term: Able to state/look up THRR       Able to check pulse independently  Yes       Intervention  Provide education and demonstration on how to check pulse in carotid and radial arteries.;Review the importance of being able to check your own pulse for safety during independent exercise       Expected Outcomes  Short Term: Able to explain why pulse checking is important during independent exercise;Long Term: Able to check pulse independently and accurately       Understanding of Exercise Prescription  Yes        Intervention  Provide education, explanation, and written materials on patient's individual exercise prescription       Expected Outcomes  Short Term: Able to explain program exercise prescription;Long Term: Able to explain home exercise prescription to exercise independently          Nutrition & Weight - Outcomes: Pre Biometrics - 04/12/17 0955      Pre Biometrics   Height  5\' 4"  (1.626 m)    Weight  207 lb 6.4 oz (94.1 kg)    Waist Circumference  42.75 inches    Hip Circumference  47 inches    Waist to Hip Ratio  0.91 %    BMI (Calculated)  35.58    Triceps Skinfold  17 mm    % Body Fat  43.2 %  Grip Strength  56.4 kg    Flexibility  0 in LBP    Single Leg Stand  16 seconds        Nutrition: Nutrition Therapy & Goals - 04/12/17 1010      Personal Nutrition Goals   Personal Goal #2  Patient is following a heart healthy diet.    Additional Goals?  No       Nutrition Discharge: Nutrition Assessments - 04/12/17 1011      MEDFICTS Scores   Pre Score  18       Education Questionnaire Score: Knowledge Questionnaire Score - 04/12/17 0954      Knowledge Questionnaire Score   Pre Score  26/28

## 2017-05-18 ENCOUNTER — Telehealth: Payer: Self-pay | Admitting: Family

## 2017-05-18 ENCOUNTER — Encounter (HOSPITAL_COMMUNITY): Payer: Medicare Other

## 2017-05-18 NOTE — Telephone Encounter (Signed)
-----   Message from Viann Fish, NP sent at 05/17/2017  6:24 PM EST ----- Regarding: follow up 6 months Dear schedulers, This pt left after his carotid duplex today and before I could evaluate him.  He needs to return in 6 months with carotid duplex. No change from the last ultrasound.  Thanks,  Vinnie Level

## 2017-05-21 ENCOUNTER — Encounter (HOSPITAL_COMMUNITY): Payer: Medicare Other

## 2017-05-23 ENCOUNTER — Other Ambulatory Visit: Payer: Self-pay

## 2017-05-23 ENCOUNTER — Encounter (HOSPITAL_COMMUNITY): Payer: Medicare Other

## 2017-05-23 DIAGNOSIS — D649 Anemia, unspecified: Secondary | ICD-10-CM

## 2017-05-23 DIAGNOSIS — K921 Melena: Secondary | ICD-10-CM

## 2017-05-23 DIAGNOSIS — Q2730 Arteriovenous malformation, site unspecified: Secondary | ICD-10-CM

## 2017-05-23 NOTE — Telephone Encounter (Signed)
She will need to hold it for 2 days prior to the enteroscopy.  Will need OK from the prescribing MD.  thanks

## 2017-05-23 NOTE — Telephone Encounter (Signed)
The pt has been scheduled for 4/4 for enteroscopy. The pt is on xarelto.  Dr Ardis Hughs should she hold?

## 2017-05-23 NOTE — Telephone Encounter (Signed)
Anti coag letter has been sent to PCP in regards to holding xarelto.  Staff message to myself to call pt and follow up with letter

## 2017-05-25 ENCOUNTER — Ambulatory Visit: Payer: Medicare Other | Admitting: Cardiology

## 2017-05-25 ENCOUNTER — Encounter (HOSPITAL_COMMUNITY): Payer: Medicare Other

## 2017-05-28 ENCOUNTER — Encounter (HOSPITAL_COMMUNITY): Payer: Self-pay | Admitting: Emergency Medicine

## 2017-05-28 ENCOUNTER — Emergency Department (HOSPITAL_COMMUNITY)
Admission: EM | Admit: 2017-05-28 | Discharge: 2017-05-28 | Disposition: A | Discharge status: Home / Self Care | Payer: Medicare Other | Attending: Emergency Medicine | Admitting: Emergency Medicine

## 2017-05-28 ENCOUNTER — Encounter (HOSPITAL_COMMUNITY): Payer: Medicare Other

## 2017-05-28 DIAGNOSIS — I252 Old myocardial infarction: Secondary | ICD-10-CM | POA: Insufficient documentation

## 2017-05-28 DIAGNOSIS — Z951 Presence of aortocoronary bypass graft: Secondary | ICD-10-CM | POA: Diagnosis not present

## 2017-05-28 DIAGNOSIS — Z955 Presence of coronary angioplasty implant and graft: Secondary | ICD-10-CM | POA: Insufficient documentation

## 2017-05-28 DIAGNOSIS — Z79899 Other long term (current) drug therapy: Secondary | ICD-10-CM | POA: Insufficient documentation

## 2017-05-28 DIAGNOSIS — I10 Essential (primary) hypertension: Secondary | ICD-10-CM | POA: Diagnosis not present

## 2017-05-28 DIAGNOSIS — F17210 Nicotine dependence, cigarettes, uncomplicated: Secondary | ICD-10-CM | POA: Insufficient documentation

## 2017-05-28 DIAGNOSIS — Z7982 Long term (current) use of aspirin: Secondary | ICD-10-CM | POA: Insufficient documentation

## 2017-05-28 DIAGNOSIS — I2510 Atherosclerotic heart disease of native coronary artery without angina pectoris: Secondary | ICD-10-CM | POA: Diagnosis not present

## 2017-05-28 DIAGNOSIS — G510 Bell's palsy: Secondary | ICD-10-CM

## 2017-05-28 DIAGNOSIS — R29810 Facial weakness: Secondary | ICD-10-CM | POA: Diagnosis present

## 2017-05-28 MED ORDER — PREDNISONE 20 MG PO TABS: 60.0000 mg | ORAL_TABLET | Freq: Every day | ORAL | 0 refills | Status: AC

## 2017-05-28 MED ORDER — VALACYCLOVIR HCL 1 G PO TABS: 1000.0000 mg | ORAL_TABLET | Freq: Three times a day (TID) | ORAL | 0 refills | Status: AC

## 2017-05-28 NOTE — ED Provider Notes (Signed)
[facility] EMERGENCY DEPARTMENT Provider Note   CSN: [id number] Arrival date & time: 05/28/17  2683     History   Chief Complaint Chief Complaint  Patient presents with  . Facial Droop    HPI Jennifer [name] is a 67 y.o. female.  HPI Patient states she woke 2 days ago with right facial droop and dryness to the right eye.  Denies any visual changes, speech changes.  No extremity weakness or numbness.  No gait difficulty.  Denies recent fever or chills.  Denies headache, neck pain, chest pain, shortness of breath, lightheadedness, abdominal pain, nausea, vomiting or diarrhea.  Denies similar symptoms previously. Past Medical History:  Diagnosis Date  . Anxiety   . Arthritis   . CAD (coronary artery disease)    a. Cath 12/2014 - mild-moderate non-obstructive CAD with 60% mid RCA, 50% mid Cx-distal Cx, 30% distal LAD, 25% D2, EF 55-65%. b. 02/2017: NSTEMI with cath showing 60% LM stenosis and 100% RCA stenosis --> CT Surgery consulted with CABG on 02/21/2017 (LIMA-LAD, SVG-OM, and SVG-PDA)  . Depression   . Dizziness   . DVT (deep venous thrombosis) ([facility])    on Xarelto  . Dyspnea    W/ EXERTION  . GERD (gastroesophageal reflux disease)   . Headache   . Heart murmur    DX   AT BIRTH  . Hyperlipidemia   . Hypertension   . 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 Myocardial infarction ([place])   . Neuropathy   . PE (pulmonary embolism)   . Tobacco abuse     Patient Active Problem List   Diagnosis Date Noted  . Heme positive stool   . S/P CABG x 3 02/21/2017  . Blood in stool   . Benign neoplasm of sigmoid colon   . Elevated troponin   . Symptomatic anemia 02/13/2017  . NSTEMI (non-ST elevated myocardial infarction) ([place]) 02/13/2017  . Symptomatic stenosis of right carotid artery without infarction 11/01/2016  . Syncope and collapse 08/10/2016  . Morbid obesity ([place]) 08/10/2016  . Low back pain 07/28/2016  . Gait abnormality 07/28/2016  . Carotid artery  stenosis 07/28/2016  . Paresthesia 07/28/2016  . Dizziness 06/20/2016  . Numbness 06/20/2016  . CAD in native artery 01/08/2015  . Chest pain 01/06/2015  . Essential hypertension 01/06/2015  . Hyperlipidemia 01/06/2015  . History of pulmonary embolus (PE) 01/06/2015  . Leukocytosis 01/06/2015  . Tobacco use disorder 01/06/2015  . History of DVT (deep vein thrombosis) 01/06/2015    Past Surgical History:  Procedure Laterality Date  . ABDOMINAL HYSTERECTOMY  1974  . CARDIAC CATHETERIZATION N/A 01/08/2015   Procedure: Left Heart Cath and Coronary Angiography;  Surgeon: Jolaine Artist, MD;  Location: [place] [facility];  Service: Cardiovascular;  Laterality: N/A;  . CARPAL TUNNEL RELEASE     RIGHT  . COLONOSCOPY WITH PROPOFOL N/A 02/16/2017   Procedure: COLONOSCOPY WITH PROPOFOL;  Surgeon: [name], MD;  Location: [facility];  Service: Endoscopy;  Laterality: N/A;  . CORONARY ARTERY BYPASS GRAFT N/A 02/21/2017   Procedure: CORONARY ARTERY BYPASS GRAFTING (CABG) times three. LIMA to LAD, SVG to OM and SVG to PDA;  Surgeon: Melrose Nakayama, MD;  Location: [place];  Service: Open Heart Surgery;  Laterality: N/A;  . CORONARY STENT PLACEMENT Right 1980  . ESOPHAGOGASTRODUODENOSCOPY (EGD) WITH PROPOFOL N/A 02/16/2017   Procedure: ESOPHAGOGASTRODUODENOSCOPY (EGD) WITH PROPOFOL;  Surgeon: [name], MD;  Location: [facility];  Service: Endoscopy;  Laterality: N/A;  . GIVENS CAPSULE STUDY N/A 05/08/2017   Procedure: GIVENS CAPSULE STUDY;  Surgeon: [name], MD;  Location: [place];  Service: Endoscopy;  Laterality: N/A;  . KNEE ARTHROSCOPY W/ MENISCAL REPAIR     LEFT KNEE   FORMED DVT (SURG TO REMOVE BLOOD CLOT)  . LEFT HEART CATH AND CORONARY ANGIOGRAPHY N/A 02/19/2017   Procedure: LEFT HEART CATH AND CORONARY ANGIOGRAPHY;  Surgeon: Nelva Bush, MD;  Location: [place] [facility];  Service: Cardiovascular;  Laterality: N/A;  . TEE WITHOUT CARDIOVERSION  N/A 02/21/2017   Procedure: TRANSESOPHAGEAL ECHOCARDIOGRAM (TEE);  Surgeon: Melrose Nakayama, MD;  Location: [place];  Service: Open Heart Surgery;  Laterality: N/A;  . TONSILLECTOMY      OB History    Gravida Para Term Preterm AB Living             2   SAB TAB Ectopic Multiple Live Births                   Home Medications    Prior to Admission medications   Medication Sig Start Date End Date Taking? Authorizing Provider  allopurinol (ZYLOPRIM) 300 MG tablet Take 300 mg by mouth daily. 12/19/16  Yes [provider]  aspirin EC 81 MG EC tablet Take 1 tablet (81 mg total) by mouth daily. 02/28/17  Yes Gold, Wayne E, PA-C  atorvastatin (LIPITOR) 40 MG tablet Take 1 tablet (40 mg total) by mouth daily. 03/12/17  Yes Strader, [place] M, PA-C  calcium carbonate (TUMS - DOSED IN MG ELEMENTAL CALCIUM) 500 MG chewable tablet Chew 2 tablets by mouth 3 (three) times daily with meals.    Yes [provider]  Cholecalciferol (VITAMIN D) 2000 units CAPS Take 2,000 Units by mouth daily.   Yes [provider]  DULoxetine (CYMBALTA) 30 MG capsule Take 30 mg by mouth daily.   Yes [provider]  FeFum-FePoly-FA-B Cmp-C-Biot (INTEGRA PLUS) CAPS Take 1 capsule daily with a meal 05/08/17  Yes Esterwood, Amy S, PA-C  gabapentin (NEURONTIN) 600 MG tablet Take 300-600 mg by mouth See admin instructions. Take 300 mg twice daily IN THE MORNING AND AT NOON AND  600 mg at bedtime.   Yes [provider]  lisinopril (PRINIVIL,ZESTRIL) 10 MG tablet Take 5 mg by mouth daily.   Yes [provider]  metoprolol tartrate (LOPRESSOR) 25 MG tablet Take 0.5 tablets (12.5 mg total) by mouth 2 (two) times daily. 03/12/17  Yes Strader, [place] M, PA-C  Oxycodone HCl 10 MG TABS Take 10 mg by mouth 3 (three) times daily as needed. For pain   Yes [provider]  ranitidine (ZANTAC) 150 MG tablet Take 1 tablet (150 mg total) by mouth 2 (two) times daily. 05/02/17   Yes Esterwood, Amy S, PA-C  rivaroxaban (XARELTO) 20 MG TABS tablet Take 20 mg by mouth daily with supper.   Yes [provider]  varenicline (CHANTIX STARTING MONTH PAK) 0.5 MG X 11 & 1 MG X 42 tablet Take one 0.5 mg tablet by mouth once daily for 3 days, then increase to one 0.5 mg tablet twice daily for 4 days, then increase to one 1 mg tablet twice daily. 05/16/17  Yes Herminio Commons, MD  predniSONE (DELTASONE) 20 MG tablet Take 3 tablets (60 mg total) by mouth daily with breakfast for 7 days. 05/28/17 06/04/17  Julianne Rice, MD  valACYclovir (VALTREX) 1000 MG tablet Take 1 tablet (1,000 mg  total) by mouth 3 (three) times daily for 7 days. 05/28/17 06/04/17  Julianne Rice, MD    Family History Family History  Problem Relation Age of Onset  . Heart attack Mother   . Heart attack Father   . Heart attack Brother        CABG  . Heart attack Brother        CABG  . Heart attack Brother        CABG  . Ovarian cancer Maternal Grandmother   . Heart disease Maternal Grandfather   . Dementia Paternal Grandmother     Social History Social History   Tobacco Use  . Smoking status: Current Every Day Smoker    Packs/day: 1.00    Years: 50.00    Pack years: 50.00    Types: Cigarettes    Start date: 06/18/1967  . Smokeless tobacco: Never Used  . Tobacco comment: smoked 3 cigarettes since sx  Substance Use Topics  . Alcohol use: No    Alcohol/week: 0.0 oz  . Drug use: No     Allergies   Bee venom   Review of Systems Review of Systems  Constitutional: Negative for chills and fever.  HENT: Negative for facial swelling, sinus pressure, sinus pain, sore throat, trouble swallowing and voice change.   Eyes: Negative for photophobia and visual disturbance.  Respiratory: Negative for cough and shortness of breath.   Cardiovascular: Negative for chest pain, palpitations and leg swelling.  Gastrointestinal: Negative for abdominal pain, diarrhea, nausea and vomiting.    Genitourinary: Negative for dysuria, flank pain, frequency and hematuria.  Musculoskeletal: Negative for back pain, gait problem, myalgias, neck pain and neck stiffness.  Skin: Negative for rash and wound.  Neurological: Positive for facial asymmetry. Negative for dizziness, syncope, speech difficulty, weakness, light-headedness, numbness and headaches.  All other systems reviewed and are negative.    Physical Exam Updated Vital Signs BP (!) 163/62 (BP Location: Left Arm)   Pulse 83   Temp 98.1 F (36.7 C) (Oral)   Resp 18   Ht 5\' 4"  (1.626 m)   Wt 93 kg (205 lb)   SpO2 100%   BMI 35.19 kg/m   Physical Exam  Constitutional: She is oriented to person, place, and time. She appears well-developed and well-nourished. No distress.  HENT:  Head: Normocephalic and atraumatic.  Mouth/Throat: Oropharynx is clear and moist. No oropharyngeal exudate.  Right-sided upper and lower facial weakness.  Uvula is midline.  Sensation to light touch is intact.  Eyes: EOM are normal. Pupils are equal, round, and reactive to light.  No nystagmus.  Neck: Normal range of motion. Neck supple.  No bruits.  Cardiovascular: Normal rate and regular rhythm. Exam reveals no gallop and no friction rub.  No murmur heard. Pulmonary/Chest: Effort normal and breath sounds normal. No stridor. No respiratory distress. She has no wheezes. She has no rales. She exhibits no tenderness.  Abdominal: Soft. Bowel sounds are normal. There is no tenderness. There is no rebound and no guarding.  Musculoskeletal: Normal range of motion. She exhibits no edema or tenderness.  Neurological: She is alert and oriented to person, place, and time.  Patient is alert and oriented x3 with clear, goal oriented speech. Patient has 5/5 motor in all extremities. Sensation is intact to light touch. Bilateral finger-to-nose is normal with no signs of dysmetria. Patient has a normal gait and walks without assistance.  Skin: Skin is warm and  dry. Capillary refill takes less than 2 seconds. No  rash noted. She is not diaphoretic. No erythema.  Psychiatric: She has a normal mood and affect. Her behavior is normal.  Nursing note and vitals reviewed.    ED Treatments / Results  Labs (all labs ordered are listed, but only abnormal results are displayed) Labs Reviewed - No data to display  EKG  EKG Interpretation None       Radiology No results found.  Procedures Procedures (including critical care time)  Medications Ordered in ED Medications - No data to display   Initial Impression / Assessment and Plan / ED Course  I have reviewed the triage vital signs and the nursing notes.  Pertinent labs & imaging results that were available during my care of the patient were reviewed by me and considered in my medical decision making (see chart for details).     Patient with isolated upper and lower right facial weakness for the past 2 days.  Consistent with Bell's palsy.  No other complaints or physical exam findings.  Vital signs are normal.  Will start on course of prednisone and Valtrex.  Patient is advised to follow-up with neurology.  She is been given strict return precautions and has voiced understanding.  Final Clinical Impressions(s) / ED Diagnoses   Final diagnoses:  Bell's palsy    ED Discharge Orders        Ordered    predniSONE (DELTASONE) 20 MG tablet  Daily with breakfast     05/28/17 1050    valACYclovir (VALTREX) 1000 MG tablet  3 times daily     05/28/17 1050       Julianne Rice, MD 05/28/17 1353

## 2017-05-28 NOTE — ED Triage Notes (Signed)
Patient complaining of right sided facial droop upon awakening Saturday. Denies pain.

## 2017-05-29 ENCOUNTER — Other Ambulatory Visit (INDEPENDENT_AMBULATORY_CARE_PROVIDER_SITE_OTHER): Payer: Medicare Other

## 2017-05-29 DIAGNOSIS — D649 Anemia, unspecified: Secondary | ICD-10-CM

## 2017-05-29 DIAGNOSIS — K921 Melena: Secondary | ICD-10-CM | POA: Diagnosis not present

## 2017-05-29 LAB — CBC WITH DIFFERENTIAL/PLATELET
Basophils Absolute: 0.1 10*3/uL (ref 0.0–0.1)
Basophils Relative: 0.4 % (ref 0.0–3.0)
Eosinophils Absolute: 0.1 10*3/uL (ref 0.0–0.7)
Eosinophils Relative: 0.5 % (ref 0.0–5.0)
HCT: 40.9 % (ref 36.0–46.0)
Hemoglobin: 12.3 g/dL (ref 12.0–15.0)
Lymphocytes Relative: 17.7 % (ref 12.0–46.0)
Lymphs Abs: 2 10*3/uL (ref 0.7–4.0)
MCHC: 30 g/dL (ref 30.0–36.0)
MCV: 78 fl (ref 78.0–100.0)
Monocytes Absolute: 0.8 10*3/uL (ref 0.1–1.0)
Monocytes Relative: 6.9 % (ref 3.0–12.0)
Neutro Abs: 8.6 10*3/uL — ABNORMAL HIGH (ref 1.4–7.7)
Neutrophils Relative %: 74.5 % (ref 43.0–77.0)
Platelets: 331 10*3/uL (ref 150.0–400.0)
RBC: 5.25 Mil/uL — ABNORMAL HIGH (ref 3.87–5.11)
RDW: 25.8 % — ABNORMAL HIGH (ref 11.5–15.5)
WBC: 11.6 10*3/uL — ABNORMAL HIGH (ref 4.0–10.5)

## 2017-05-30 ENCOUNTER — Encounter (HOSPITAL_COMMUNITY): Payer: Medicare Other

## 2017-05-30 ENCOUNTER — Other Ambulatory Visit: Payer: Self-pay

## 2017-05-30 DIAGNOSIS — I6529 Occlusion and stenosis of unspecified carotid artery: Secondary | ICD-10-CM

## 2017-05-31 DIAGNOSIS — I10 Essential (primary) hypertension: Secondary | ICD-10-CM | POA: Diagnosis not present

## 2017-05-31 DIAGNOSIS — I509 Heart failure, unspecified: Secondary | ICD-10-CM | POA: Diagnosis not present

## 2017-05-31 DIAGNOSIS — Z1389 Encounter for screening for other disorder: Secondary | ICD-10-CM | POA: Diagnosis not present

## 2017-06-01 ENCOUNTER — Encounter (HOSPITAL_COMMUNITY): Payer: Medicare Other

## 2017-06-04 ENCOUNTER — Encounter (HOSPITAL_COMMUNITY): Payer: Self-pay

## 2017-06-04 ENCOUNTER — Other Ambulatory Visit: Payer: Self-pay

## 2017-06-04 ENCOUNTER — Encounter (HOSPITAL_COMMUNITY): Payer: Medicare Other

## 2017-06-04 NOTE — Progress Notes (Addendum)
Pt. Instructed to call Dr. Ardis Hughs office regarding when to stop her Xarelto. She Verbalized understanding. Pt. LVM wondering if there was any bowel prep for her procedure . I returned her call and left VM for her to call Dr. Ardis Hughs office to answer that question. Left Dr. Ardis Hughs office number on her answering machine.

## 2017-06-06 ENCOUNTER — Encounter (HOSPITAL_COMMUNITY): Payer: Medicare Other

## 2017-06-08 ENCOUNTER — Encounter (HOSPITAL_COMMUNITY): Payer: Medicare Other

## 2017-06-11 ENCOUNTER — Encounter (HOSPITAL_COMMUNITY): Payer: Medicare Other

## 2017-06-11 ENCOUNTER — Telehealth: Payer: Self-pay

## 2017-06-11 NOTE — Telephone Encounter (Signed)
-----   Message from Jeoffrey Massed, RN sent at 05/23/2017 11:13 AM EDT ----- Waiting on anti coag from Dr Gerarda Fraction for 06/14/17 enteroscopy (xarelto)

## 2017-06-11 NOTE — Telephone Encounter (Signed)
I have resent the letter to Dr Gerarda Fraction today and requested an urgent response.  The pt has been advised to call if she does not receive a call by the end of the day.

## 2017-06-12 NOTE — Telephone Encounter (Signed)
Patient calling back states she has not gotten a call back from Dr.Fusco. Patient states she also has questions about procedure asking if there is any prep involved. Patient's best call back # [phone].

## 2017-06-12 NOTE — Telephone Encounter (Signed)
Dr Fusco's office called and ok'd the 2 day xarelto hold for the pt.  They will fax results to our office to be scanned in

## 2017-06-12 NOTE — Telephone Encounter (Signed)
The pt has been advised of the instructions for the procedure and a message has been left for Dr Fusco's office to call with a response regarding xarelto.  The pt will also call and let me know if she has not heard anything by the end of day today.

## 2017-06-13 ENCOUNTER — Encounter (HOSPITAL_COMMUNITY): Payer: Medicare Other

## 2017-06-13 NOTE — Anesthesia Preprocedure Evaluation (Addendum)
Anesthesia Evaluation  Patient identified by MRN, date of birth, ID band Patient awake    Reviewed: Allergy & Precautions, NPO status , Patient's Chart, lab work & pertinent test results  Airway Mallampati: I  TM Distance: >3 FB Neck ROM: Full    Dental no notable dental hx. (+) Teeth Intact   Pulmonary shortness of breath and with exertion, Current Smoker,    Pulmonary exam normal breath sounds clear to auscultation       Cardiovascular Exercise Tolerance: Good hypertension, Pt. on medications and Pt. on home beta blockers + CAD, + Past MI, + CABG and + Peripheral Vascular Disease  Normal cardiovascular exam+ Valvular Problems/Murmurs MR  Rhythm:Regular Rate:Normal  Hx/o PTE Right carotid stenosis - S/P angioplasty w/ stent Hx/o Non STEMI 02/2017 Cath 60% LM 100% RCA S/P 3v CABG LVEF 60-65%, Mild MR   Neuro/Psych PSYCHIATRIC DISORDERS Anxiety Depression Hx/o Bell's Palsy  Neuromuscular disease    GI/Hepatic GERD  Medicated and Controlled,Small bowel AVM   Endo/Other  Hyperlipidemia Obesity Gout  Renal/GU Renal diseaseSolitary kidney  negative genitourinary   Musculoskeletal  (+) Arthritis , Osteoarthritis,    Abdominal (+) + obese,   Peds  Hematology  (+) anemia ,   Anesthesia Other Findings   Reproductive/Obstetrics                        Anesthesia Physical Anesthesia Plan  ASA: III  Anesthesia Plan: MAC   Post-op Pain Management:    Induction: Intravenous  PONV Risk Score and Plan: 1 and Ondansetron and Propofol infusion  Airway Management Planned: Natural Airway, Nasal Cannula and Simple Face Mask  Additional Equipment:   Intra-op Plan:   Post-operative Plan:   Informed Consent: I have reviewed the patients History and Physical, chart, labs and discussed the procedure including the risks, benefits and alternatives for the proposed anesthesia with the patient or  authorized representative who has indicated his/her understanding and acceptance.   Dental advisory given  Plan Discussed with: CRNA, Anesthesiologist and Surgeon  Anesthesia Plan Comments:        Anesthesia Quick Evaluation

## 2017-06-14 ENCOUNTER — Ambulatory Visit (HOSPITAL_COMMUNITY): Payer: Medicare Other | Admitting: Anesthesiology

## 2017-06-14 ENCOUNTER — Other Ambulatory Visit: Payer: Self-pay

## 2017-06-14 ENCOUNTER — Encounter (HOSPITAL_COMMUNITY): Payer: Self-pay | Admitting: Anesthesiology

## 2017-06-14 ENCOUNTER — Encounter (HOSPITAL_COMMUNITY)
Admission: RE | Disposition: A | Discharge status: Home / Self Care | Payer: Self-pay | Source: Ambulatory Visit | Attending: Gastroenterology

## 2017-06-14 ENCOUNTER — Ambulatory Visit (HOSPITAL_COMMUNITY)
Admission: RE | Admit: 2017-06-14 | Discharge: 2017-06-14 | Disposition: A | Discharge status: Home / Self Care | Payer: Medicare Other | Source: Ambulatory Visit | Attending: Gastroenterology | Admitting: Gastroenterology

## 2017-06-14 DIAGNOSIS — I252 Old myocardial infarction: Secondary | ICD-10-CM | POA: Diagnosis not present

## 2017-06-14 DIAGNOSIS — M1990 Unspecified osteoarthritis, unspecified site: Secondary | ICD-10-CM | POA: Insufficient documentation

## 2017-06-14 DIAGNOSIS — I739 Peripheral vascular disease, unspecified: Secondary | ICD-10-CM

## 2017-06-14 DIAGNOSIS — Z86718 Personal history of other venous thrombosis and embolism: Secondary | ICD-10-CM | POA: Insufficient documentation

## 2017-06-14 DIAGNOSIS — Z86711 Personal history of pulmonary embolism: Secondary | ICD-10-CM | POA: Insufficient documentation

## 2017-06-14 DIAGNOSIS — Z6835 Body mass index (BMI) 35.0-35.9, adult: Secondary | ICD-10-CM | POA: Insufficient documentation

## 2017-06-14 DIAGNOSIS — M109 Gout, unspecified: Secondary | ICD-10-CM | POA: Insufficient documentation

## 2017-06-14 DIAGNOSIS — D175 Benign lipomatous neoplasm of intra-abdominal organs: Secondary | ICD-10-CM | POA: Diagnosis not present

## 2017-06-14 DIAGNOSIS — F17210 Nicotine dependence, cigarettes, uncomplicated: Secondary | ICD-10-CM | POA: Diagnosis not present

## 2017-06-14 DIAGNOSIS — Q2733 Arteriovenous malformation of digestive system vessel: Secondary | ICD-10-CM | POA: Insufficient documentation

## 2017-06-14 DIAGNOSIS — K219 Gastro-esophageal reflux disease without esophagitis: Secondary | ICD-10-CM | POA: Insufficient documentation

## 2017-06-14 DIAGNOSIS — Z91030 Bee allergy status: Secondary | ICD-10-CM | POA: Insufficient documentation

## 2017-06-14 DIAGNOSIS — F419 Anxiety disorder, unspecified: Secondary | ICD-10-CM | POA: Diagnosis not present

## 2017-06-14 DIAGNOSIS — Z951 Presence of aortocoronary bypass graft: Secondary | ICD-10-CM | POA: Insufficient documentation

## 2017-06-14 DIAGNOSIS — Q2730 Arteriovenous malformation, site unspecified: Secondary | ICD-10-CM

## 2017-06-14 DIAGNOSIS — K449 Diaphragmatic hernia without obstruction or gangrene: Secondary | ICD-10-CM | POA: Diagnosis not present

## 2017-06-14 DIAGNOSIS — D649 Anemia, unspecified: Secondary | ICD-10-CM | POA: Insufficient documentation

## 2017-06-14 DIAGNOSIS — I340 Nonrheumatic mitral (valve) insufficiency: Secondary | ICD-10-CM | POA: Insufficient documentation

## 2017-06-14 DIAGNOSIS — E785 Hyperlipidemia, unspecified: Secondary | ICD-10-CM | POA: Diagnosis not present

## 2017-06-14 DIAGNOSIS — I2510 Atherosclerotic heart disease of native coronary artery without angina pectoris: Secondary | ICD-10-CM | POA: Diagnosis not present

## 2017-06-14 DIAGNOSIS — Z7982 Long term (current) use of aspirin: Secondary | ICD-10-CM | POA: Diagnosis not present

## 2017-06-14 DIAGNOSIS — I10 Essential (primary) hypertension: Secondary | ICD-10-CM | POA: Diagnosis not present

## 2017-06-14 DIAGNOSIS — E669 Obesity, unspecified: Secondary | ICD-10-CM | POA: Diagnosis not present

## 2017-06-14 DIAGNOSIS — Z7901 Long term (current) use of anticoagulants: Secondary | ICD-10-CM | POA: Diagnosis not present

## 2017-06-14 DIAGNOSIS — R011 Cardiac murmur, unspecified: Secondary | ICD-10-CM | POA: Insufficient documentation

## 2017-06-14 DIAGNOSIS — K31819 Angiodysplasia of stomach and duodenum without bleeding: Secondary | ICD-10-CM | POA: Diagnosis present

## 2017-06-14 DIAGNOSIS — K5520 Angiodysplasia of colon without hemorrhage: Secondary | ICD-10-CM | POA: Diagnosis not present

## 2017-06-14 DIAGNOSIS — I25810 Atherosclerosis of coronary artery bypass graft(s) without angina pectoris: Secondary | ICD-10-CM | POA: Diagnosis not present

## 2017-06-14 DIAGNOSIS — F329 Major depressive disorder, single episode, unspecified: Secondary | ICD-10-CM | POA: Insufficient documentation

## 2017-06-14 DIAGNOSIS — Z79899 Other long term (current) drug therapy: Secondary | ICD-10-CM | POA: Insufficient documentation

## 2017-06-14 DIAGNOSIS — Q600 Renal agenesis, unilateral: Secondary | ICD-10-CM | POA: Insufficient documentation

## 2017-06-14 HISTORY — PX: ENTEROSCOPY: SHX5533

## 2017-06-14 HISTORY — DX: Anemia, unspecified: D64.9

## 2017-06-14 HISTORY — DX: Bell's palsy: G51.0

## 2017-06-14 SURGERY — ENTEROSCOPY
Anesthesia: Monitor Anesthesia Care

## 2017-06-14 MED ORDER — PROPOFOL 500 MG/50ML IV EMUL
INTRAVENOUS | Status: DC | PRN
  Administered 2017-06-14: 100 ug/kg/min via INTRAVENOUS

## 2017-06-14 MED ORDER — PROPOFOL 10 MG/ML IV BOLUS
INTRAVENOUS | Status: AC
  Filled 2017-06-14: qty 40

## 2017-06-14 MED ORDER — GLUCAGON HCL RDNA (DIAGNOSTIC) 1 MG IJ SOLR
INTRAMUSCULAR | Status: DC | PRN
  Administered 2017-06-14: .5 mg via INTRAVENOUS

## 2017-06-14 MED ORDER — LACTATED RINGERS IV SOLN
INTRAVENOUS | Status: DC
  Administered 2017-06-14: 1000 mL via INTRAVENOUS

## 2017-06-14 MED ORDER — SODIUM CHLORIDE 0.9 % IV SOLN: INTRAVENOUS | Status: DC

## 2017-06-14 MED ORDER — PROPOFOL 10 MG/ML IV BOLUS
INTRAVENOUS | Status: DC | PRN
  Administered 2017-06-14: 40 mg via INTRAVENOUS
  Administered 2017-06-14: 20 mg via INTRAVENOUS
  Administered 2017-06-14: 30 mg via INTRAVENOUS

## 2017-06-14 NOTE — Anesthesia Postprocedure Evaluation (Signed)
Anesthesia Post Note  Patient: Jennifer [name]  Procedure(s) Performed: ENTEROSCOPY (N/A )     Patient location during evaluation: PACU Anesthesia Type: MAC Level of consciousness: awake and alert and oriented Pain management: pain level controlled Vital Signs Assessment: post-procedure vital signs reviewed and stable Respiratory status: spontaneous breathing, nonlabored ventilation and respiratory function stable Cardiovascular status: stable and blood pressure returned to baseline Postop Assessment: no apparent nausea or vomiting Anesthetic complications: no    Last Vitals:  Vitals:   06/14/17 0920 06/14/17 0945  BP: (!) 69/28 (!) 90/39  Pulse: (!) 59 (!) 58  Resp: 19 19  Temp:    SpO2: 90% 95%    Last Pain:  Vitals:   06/14/17 0920  TempSrc:   PainSc: 0-No pain                 Annise Boran A.

## 2017-06-14 NOTE — Op Note (Signed)
[facility] Patient Name: Jennifer [name] Procedure Date: 06/14/2017 MRN: [id number] Attending MD: [name] , MD Date of Birth: February 22, 1951 CSN: [id number] Age: 67 Admit Type: Outpatient Procedure:                Small bowel enteroscopy Indications:              Arteriovenous malformation in the small intestine                            noted on recent capsule endoscopy; recurrent                            anemia, EGD/colonoscopy 3-4 months ago without                            obvious source Providers:                [name], MD, Cleda Daub, RN, Charolette Child, Technician, Anne Fu CRNA, CRNA Referring MD:              Medicines:                Monitored Anesthesia Care Complications:            No immediate complications. Estimated blood loss:                            None. Estimated Blood Loss:     Estimated blood loss: none. Procedure:                Pre-Anesthesia Assessment:                           - Prior to the procedure, a History and Physical                            was performed, and patient medications and                            allergies were reviewed. The patient's tolerance of                            previous anesthesia was also reviewed. The risks                            and benefits of the procedure and the sedation                            options and risks were discussed with the patient.                            All questions were answered, and informed consent                            was  obtained. Prior Anticoagulants: The patient has                            taken Xarelto (rivaroxaban), last dose was 2 days                            prior to procedure. ASA Grade Assessment: IV - A                            patient with severe systemic disease that is a                            constant threat to life. After reviewing the risks                            and  benefits, the patient was deemed in                            satisfactory condition to undergo the procedure.                           After obtaining informed consent, the endoscope was                            passed under direct vision. Throughout the                            procedure, the patient's blood pressure, pulse, and                            oxygen saturations were monitored continuously. The                            EC-[id number] ([id number]) scope was introduced through                            the mouth and advanced to the proximal jejunum. The                            small bowel enteroscopy was accomplished without                            difficulty. The patient tolerated the procedure                            well. Scope In: Scope Out: Findings:      Two small, non-bleeding AVMs were noted in the proximal to mid jejunum.       One was associated with a small lipoma. Both were destroyed with       application of APC. Neither bled during treatment.      Otherwise, limited evaluation of the UGI tract was normal. 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  Treated with argon plasma coagulation (APC).                           - These AVMs MAY have been the source of her recent                            anemia. Ongoing blood thinners (ASA, Xarelto)                            undoubtedly contributes to any blood loss. Recommendation:           - Discharge patient to home (ambulatory).                           - CBC in 4 weeks at [facility].                           - OK to restart bloodthinners today (ASA, xarelto) Procedure Code(s):        --- Professional ---                           [phone], Small intestinal endoscopy, enteroscopy                            beyond second portion of duodenum, not including                            ileum; with ablation of tumor(s), polyp(s), or                             other lesion(s) not amenable to removal by hot                            biopsy forceps, bipolar cautery or snare technique Diagnosis Code(s):        --- Professional ---                           H41.93, Angiodysplasia of colon without hemorrhage                           Q27.33, Arteriovenous malformation of digestive                            system vessel CPT copyright 2017 American Medical Association. All rights reserved. The codes documented in this report are preliminary and upon coder review may  be revised to meet current compliance requirements. [name], MD 06/14/2017 9:23:35 AM This report has been signed electronically. Number of Addenda: 0

## 2017-06-14 NOTE — Transfer of Care (Signed)
Immediate Anesthesia Transfer of Care Note  Patient: Jennifer [name]  Procedure(s) Performed: Procedure(s): ENTEROSCOPY (N/A)  Patient Location: PACU  Anesthesia Type:MAC  Level of Consciousness:  sedated, patient cooperative and responds to stimulation  Airway & Oxygen Therapy:Patient Spontanous Breathing and Patient connected to face mask oxgen  Post-op Assessment:  Report given to PACU RN and Post -op Vital signs reviewed and stable  Post vital signs:  Reviewed and stable  Last Vitals:  Vitals:   06/14/17 0916 06/14/17 0920  BP: (!) 78/30 (!) 69/28  Pulse: (!) 59 (!) 59  Resp: 17 19  Temp: (!) 36.4 C   SpO2: 36% 64%    Complications: No apparent anesthesia complications

## 2017-06-14 NOTE — Interval H&P Note (Signed)
History and Physical Interval Note:  06/14/2017 7:51 AM  Jennifer [name]  has presented today for surgery, with the diagnosis of anemia, small bowel AVM  The various methods of treatment have been discussed with the patient and family. After consideration of risks, benefits and other options for treatment, the patient has consented to  Procedure(s): ENTEROSCOPY (N/A) as a surgical intervention .  The patient's history has been reviewed, patient examined, no change in status, stable for surgery.  I have reviewed the patient's chart and labs.  Questions were answered to the patient's satisfaction.     [name]

## 2017-06-14 NOTE — Discharge Instructions (Signed)
YOU HAD AN ENDOSCOPIC PROCEDURE TODAY: Refer to the procedure report and other information in the discharge instructions given to you for any specific questions about what was found during the examination. If this information does not answer your questions, please call [facility] office at [phone] to clarify.   YOU SHOULD EXPECT: Some feelings of bloating in the abdomen. Passage of more gas than usual. Walking can help get rid of the air that was put into your GI tract during the procedure and reduce the bloating. If you had a lower endoscopy (such as a colonoscopy or flexible sigmoidoscopy) you may notice spotting of blood in your stool or on the toilet paper. Some abdominal soreness may be present for a day or two, also.  DIET: Your first meal following the procedure should be a light meal and then it is ok to progress to your normal diet. A half-sandwich or bowl of soup is an example of a good first meal. Heavy or fried foods are harder to digest and may make you feel nauseous or bloated. Drink plenty of fluids but you should avoid alcoholic beverages for 24 hours. If you had a esophageal dilation, please see attached instructions for diet.    ACTIVITY: Your care partner should take you home directly after the procedure. You should plan to take it easy, moving slowly for the rest of the day. You can resume normal activity the day after the procedure however YOU SHOULD NOT DRIVE, use power tools, machinery or perform tasks that involve climbing or major physical exertion for 24 hours (because of the sedation medicines used during the test).   SYMPTOMS TO REPORT IMMEDIATELY: A gastroenterologist can be reached at any hour. Please call [phone]  for any of the following symptoms:   Following upper endoscopy (EGD, EUS, ERCP, esophageal dilation) Vomiting of blood or coffee ground material  New, significant abdominal pain  New, significant chest pain or pain under the shoulder blades  Painful or  persistently difficult swallowing  New shortness of breath  Black, tarry-looking or red, bloody stools  FOLLOW UP:  If any biopsies were taken you will be contacted by phone or by letter within the next 1-3 weeks. Call [phone]  if you have not heard about the biopsies in 3 weeks.  Please also call with any specific questions about appointments or follow up tests.  

## 2017-06-15 ENCOUNTER — Encounter (HOSPITAL_COMMUNITY): Payer: Medicare Other

## 2017-06-15 ENCOUNTER — Encounter (HOSPITAL_COMMUNITY): Payer: Self-pay | Admitting: Gastroenterology

## 2017-06-18 ENCOUNTER — Encounter (HOSPITAL_COMMUNITY): Payer: Medicare Other

## 2017-06-20 ENCOUNTER — Encounter (HOSPITAL_COMMUNITY): Payer: Medicare Other

## 2017-06-22 ENCOUNTER — Encounter (HOSPITAL_COMMUNITY): Payer: Medicare Other

## 2017-06-25 ENCOUNTER — Encounter (HOSPITAL_COMMUNITY): Payer: Medicare Other

## 2017-06-27 ENCOUNTER — Encounter (HOSPITAL_COMMUNITY): Payer: Medicare Other

## 2017-06-29 ENCOUNTER — Encounter (HOSPITAL_COMMUNITY): Payer: Medicare Other

## 2017-07-02 ENCOUNTER — Encounter (HOSPITAL_COMMUNITY): Payer: Medicare Other

## 2017-07-04 ENCOUNTER — Encounter (HOSPITAL_COMMUNITY): Payer: Medicare Other

## 2017-07-06 ENCOUNTER — Encounter (HOSPITAL_COMMUNITY): Payer: Medicare Other

## 2017-07-30 ENCOUNTER — Other Ambulatory Visit: Payer: Self-pay

## 2017-07-30 NOTE — Patient Outreach (Signed)
[facility]) Care Management  07/30/2017  SHARNIECE GIBBON [birth year] [id number]   Medication Adherence call to Mr. Marquette Saa left a message for patient to call back patients is past due under [facility]. on Atorvastatin 80 mg not sure if this is a correct telephone number for the patient.   [place] [facility] Direct Dial [phone]  Fax [phone] [email or web address]

## 2017-07-30 NOTE — Patient Outreach (Signed)
[facility]) Care Management  07/30/2017  Jennifer [name] 1950/12/10 [id number]   Medication Adherence call to Jennifer [name] patient's telephone number is disconnected under [facility] and [place] said last time he pick up medication was on april/2019.patient is due on Lisinopril 20mg  and Atorvastatin 40 mg.   [facility] Direct Dial [phone]  Fax [phone] [name]

## 2017-08-10 ENCOUNTER — Encounter: Payer: Self-pay | Admitting: Physician Assistant

## 2017-08-13 ENCOUNTER — Other Ambulatory Visit: Payer: Self-pay

## 2017-08-13 DIAGNOSIS — Q2730 Arteriovenous malformation, site unspecified: Secondary | ICD-10-CM

## 2017-08-13 DIAGNOSIS — D649 Anemia, unspecified: Secondary | ICD-10-CM

## 2017-08-30 DIAGNOSIS — Z951 Presence of aortocoronary bypass graft: Secondary | ICD-10-CM | POA: Diagnosis not present

## 2017-08-30 DIAGNOSIS — M545 Low back pain: Secondary | ICD-10-CM | POA: Diagnosis not present

## 2017-08-30 DIAGNOSIS — I10 Essential (primary) hypertension: Secondary | ICD-10-CM | POA: Diagnosis not present

## 2017-08-30 DIAGNOSIS — G894 Chronic pain syndrome: Secondary | ICD-10-CM | POA: Diagnosis not present

## 2017-08-31 DIAGNOSIS — Z0000 Encounter for general adult medical examination without abnormal findings: Secondary | ICD-10-CM | POA: Diagnosis not present

## 2017-09-14 ENCOUNTER — Other Ambulatory Visit (INDEPENDENT_AMBULATORY_CARE_PROVIDER_SITE_OTHER): Payer: Medicare Other

## 2017-09-14 DIAGNOSIS — Q2730 Arteriovenous malformation, site unspecified: Secondary | ICD-10-CM | POA: Diagnosis not present

## 2017-09-14 DIAGNOSIS — D649 Anemia, unspecified: Secondary | ICD-10-CM

## 2017-09-14 LAB — CBC WITH DIFFERENTIAL/PLATELET
Basophils Absolute: 0.1 10*3/uL (ref 0.0–0.1)
Basophils Relative: 0.9 % (ref 0.0–3.0)
Eosinophils Absolute: 0.2 10*3/uL (ref 0.0–0.7)
Eosinophils Relative: 2.4 % (ref 0.0–5.0)
HCT: 44.2 % (ref 36.0–46.0)
Hemoglobin: 14.7 g/dL (ref 12.0–15.0)
Lymphocytes Relative: 27.7 % (ref 12.0–46.0)
Lymphs Abs: 2.2 10*3/uL (ref 0.7–4.0)
MCHC: 33.2 g/dL (ref 30.0–36.0)
MCV: 88.4 fl (ref 78.0–100.0)
Monocytes Absolute: 0.6 10*3/uL (ref 0.1–1.0)
Monocytes Relative: 7.4 % (ref 3.0–12.0)
Neutro Abs: 4.9 10*3/uL (ref 1.4–7.7)
Neutrophils Relative %: 61.6 % (ref 43.0–77.0)
Platelets: 272 10*3/uL (ref 150.0–400.0)
RBC: 5 Mil/uL (ref 3.87–5.11)
RDW: 14 % (ref 11.5–15.5)
WBC: 8 10*3/uL (ref 4.0–10.5)

## 2017-09-20 ENCOUNTER — Telehealth: Payer: Self-pay | Admitting: Gastroenterology

## 2017-09-20 DIAGNOSIS — D649 Anemia, unspecified: Secondary | ICD-10-CM

## 2017-09-20 NOTE — Telephone Encounter (Signed)
Patient returned your call and would like a call back.

## 2017-09-20 NOTE — Telephone Encounter (Signed)
Jennifer Banister, MD  Timothy Lasso, RN          Please call the patient. Labs look great. She needs to continue her iron (integra plus) every day. Cbc in 3 months with rov shortly afterwards. thanks    12/05/17 at 915 am  The patient has been notified of this information and all questions answered.

## 2017-10-17 DIAGNOSIS — H2513 Age-related nuclear cataract, bilateral: Secondary | ICD-10-CM | POA: Diagnosis not present

## 2017-10-17 DIAGNOSIS — H40033 Anatomical narrow angle, bilateral: Secondary | ICD-10-CM | POA: Diagnosis not present

## 2017-10-20 DIAGNOSIS — G44219 Episodic tension-type headache, not intractable: Secondary | ICD-10-CM | POA: Diagnosis not present

## 2017-11-22 ENCOUNTER — Encounter (HOSPITAL_COMMUNITY): Payer: Medicare Other

## 2017-11-22 ENCOUNTER — Ambulatory Visit: Payer: Medicare Other | Admitting: Family

## 2017-12-05 ENCOUNTER — Ambulatory Visit: Payer: Medicare Other | Admitting: Gastroenterology

## 2017-12-06 DIAGNOSIS — Z23 Encounter for immunization: Secondary | ICD-10-CM | POA: Diagnosis not present

## 2017-12-06 DIAGNOSIS — M545 Low back pain: Secondary | ICD-10-CM | POA: Diagnosis not present

## 2017-12-06 DIAGNOSIS — G894 Chronic pain syndrome: Secondary | ICD-10-CM | POA: Diagnosis not present

## 2017-12-06 DIAGNOSIS — I509 Heart failure, unspecified: Secondary | ICD-10-CM | POA: Diagnosis not present

## 2017-12-10 ENCOUNTER — Ambulatory Visit (HOSPITAL_COMMUNITY)
Admission: RE | Admit: 2017-12-10 | Discharge: 2017-12-10 | Disposition: A | Discharge status: Home / Self Care | Payer: Medicare Other | Source: Ambulatory Visit | Attending: Family | Admitting: Family

## 2017-12-10 ENCOUNTER — Other Ambulatory Visit: Payer: Self-pay

## 2017-12-10 ENCOUNTER — Encounter: Payer: Self-pay | Admitting: Family

## 2017-12-10 ENCOUNTER — Ambulatory Visit (INDEPENDENT_AMBULATORY_CARE_PROVIDER_SITE_OTHER): Payer: Medicare Other | Admitting: Family

## 2017-12-10 VITALS — BP 167/86 | HR 72 | Temp 97.8°F | Resp 18 | Ht 64.0 in | Wt 226.0 lb

## 2017-12-10 DIAGNOSIS — I6529 Occlusion and stenosis of unspecified carotid artery: Secondary | ICD-10-CM

## 2017-12-10 DIAGNOSIS — I10 Essential (primary) hypertension: Secondary | ICD-10-CM

## 2017-12-10 DIAGNOSIS — G894 Chronic pain syndrome: Secondary | ICD-10-CM

## 2017-12-10 DIAGNOSIS — F17200 Nicotine dependence, unspecified, uncomplicated: Secondary | ICD-10-CM

## 2017-12-10 DIAGNOSIS — I2510 Atherosclerotic heart disease of native coronary artery without angina pectoris: Secondary | ICD-10-CM | POA: Diagnosis not present

## 2017-12-10 DIAGNOSIS — I6523 Occlusion and stenosis of bilateral carotid arteries: Secondary | ICD-10-CM | POA: Diagnosis not present

## 2017-12-10 DIAGNOSIS — E785 Hyperlipidemia, unspecified: Secondary | ICD-10-CM | POA: Diagnosis not present

## 2017-12-10 DIAGNOSIS — M5136 Other intervertebral disc degeneration, lumbar region: Secondary | ICD-10-CM | POA: Diagnosis not present

## 2017-12-10 DIAGNOSIS — Z95828 Presence of other vascular implants and grafts: Secondary | ICD-10-CM | POA: Diagnosis not present

## 2017-12-10 DIAGNOSIS — I252 Old myocardial infarction: Secondary | ICD-10-CM | POA: Diagnosis not present

## 2017-12-10 DIAGNOSIS — Z7901 Long term (current) use of anticoagulants: Secondary | ICD-10-CM

## 2017-12-10 DIAGNOSIS — M51369 Other intervertebral disc degeneration, lumbar region without mention of lumbar back pain or lower extremity pain: Secondary | ICD-10-CM

## 2017-12-10 NOTE — Patient Instructions (Signed)
Steps to Quit Smoking Smoking tobacco can be bad for your health. It can also affect almost every organ in your body. Smoking puts you and people around you at risk for many serious long-lasting (chronic) diseases. Quitting smoking is hard, but it is one of the best things that you can do for your health. It is never too late to quit. What are the benefits of quitting smoking? When you quit smoking, you lower your risk for getting serious diseases and conditions. They can include:  Lung cancer or lung disease.  Heart disease.  Stroke.  Heart attack.  Not being able to have children (infertility).  Weak bones (osteoporosis) and broken bones (fractures).  If you have coughing, wheezing, and shortness of breath, those symptoms may get better when you quit. You may also get sick less often. If you are pregnant, quitting smoking can help to lower your chances of having a baby of low birth weight. What can I do to help me quit smoking? Talk with your doctor about what can help you quit smoking. Some things you can do (strategies) include:  Quitting smoking totally, instead of slowly cutting back how much you smoke over a period of time.  Going to in-person counseling. You are more likely to quit if you go to many counseling sessions.  Using resources and support systems, such as: ? Online chats with a counselor. ? Phone quitlines. ? Printed self-help materials. ? Support groups or group counseling. ? Text messaging programs. ? Mobile phone apps or applications.  Taking medicines. Some of these medicines may have nicotine in them. If you are pregnant or breastfeeding, do not take any medicines to quit smoking unless your doctor says it is okay. Talk with your doctor about counseling or other things that can help you.  Talk with your doctor about using more than one strategy at the same time, such as taking medicines while you are also going to in-person counseling. This can help make  quitting easier. What things can I do to make it easier to quit? Quitting smoking might feel very hard at first, but there is a lot that you can do to make it easier. Take these steps:  Talk to your family and friends. Ask them to support and encourage you.  Call phone quitlines, reach out to support groups, or work with a counselor.  Ask people who smoke to not smoke around you.  Avoid places that make you want (trigger) to smoke, such as: ? Bars. ? Parties. ? Smoke-break areas at work.  Spend time with people who do not smoke.  Lower the stress in your life. Stress can make you want to smoke. Try these things to help your stress: ? Getting regular exercise. ? Deep-breathing exercises. ? Yoga. ? Meditating. ? Doing a body scan. To do this, close your eyes, focus on one area of your body at a time from head to toe, and notice which parts of your body are tense. Try to relax the muscles in those areas.  Download or buy apps on your mobile phone or tablet that can help you stick to your quit plan. There are many free apps, such as QuitGuide from the CDC (Centers for Disease Control and Prevention). You can find more support from smokefree.gov and other websites.  This information is not intended to replace advice given to you by your health care provider. Make sure you discuss any questions you have with your health care provider. Document Released: 12/24/2008 Document   Revised: 10/26/2015 Document Reviewed: 07/14/2014 Elsevier Interactive Patient Education  2018 Elsevier Inc.     Stroke Prevention Some health problems and behaviors may make it more likely for you to have a stroke. Below are ways to lessen your risk of having a stroke.  Be active for at least 30 minutes on most or all days.  Do not smoke. Try not to be around others who smoke.  Do not drink too much alcohol. ? Do not have more than 2 drinks a day if you are a man. ? Do not have more than 1 drink a day if you  are a woman and are not pregnant.  Eat healthy foods, such as fruits and vegetables. If you were put on a specific diet, follow the diet as told.  Keep your cholesterol levels under control through diet and medicines. Look for foods that are low in saturated fat, trans fat, cholesterol, and are high in fiber.  If you have diabetes, follow all diet plans and take your medicine as told.  Ask your doctor if you need treatment to lower your blood pressure. If you have high blood pressure (hypertension), follow all diet plans and take your medicine as told by your doctor.  If you are 18-39 years old, have your blood pressure checked every 3-5 years. If you are age 40 or older, have your blood pressure checked every year.  Keep a healthy weight. Eat foods that are low in calories, salt, saturated fat, trans fat, and cholesterol.  Do not take drugs.  Avoid birth control pills, if this applies. Talk to your doctor about the risks of taking birth control pills.  Talk to your doctor if you have sleep problems (sleep apnea).  Take all medicine as told by your doctor. ? You may be told to take aspirin or blood thinner medicine. Take this medicine as told by your doctor. ? Understand your medicine instructions.  Make sure any other conditions you have are being taken care of.  Get help right away if:  You suddenly lose feeling (you feel numb) or have weakness in your face, arm, or leg.  Your face or eyelid hangs down to one side.  You suddenly feel confused.  You have trouble talking (aphasia) or understanding what people are saying.  You suddenly have trouble seeing in one or both eyes.  You suddenly have trouble walking.  You are dizzy.  You lose your balance or your movements are clumsy (uncoordinated).  You suddenly have a very bad headache and you do not know the cause.  You have new chest pain.  Your heart feels like it is fluttering or skipping a beat (irregular  heartbeat). Do not wait to see if the symptoms above go away. Get help right away. Call your local emergency services (911 in [place] Do not drive yourself to the hospital. This information is not intended to replace advice given to you by your health care provider. Make sure you discuss any questions you have with your health care provider. Document Released: 08/29/2011 Document Revised: 08/05/2015 Document Reviewed: 08/30/2012 Elsevier Interactive Patient Education  2018 Elsevier Inc.  

## 2017-12-10 NOTE — Progress Notes (Signed)
Chief Complaint: Follow up Extracranial Carotid Artery Stenosis   History of Present Illness  Jennifer [name] is a 67 y.o. female who is s/p Trans carotid artery revascularization (TCAR) right side on 11-01-16 by Dr. Oneida Alar.    Hx: She apparently had some dizziness as well and states she may have even had 2 syncopal events. Carotid duplex in July 2018 showed 70-99% right carotid stenosis and 50-70% left carotid stenosis. On CT Angio was thought that her stenosis was a high lesion that may be difficult to access from a transcervical approach. The patient currently is on Xarelto for prior DVT and pulmonary embolus. 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 Plavix was recently added to this prior to her TCAR surgery.   Pt was last evaluated on 11-16-16 by [facility] [employer]. At that time since surgery pt has been taken off her antihypertensive medications.  Her BP that day was 123/47.  She had a follow up with her cardiologist scheduled.   Pt was to follow up for repeat carotid duplex in 6 months.  She had CABG x 3 vessels in December 2018.  She was having syncopal episodes from October to December 2018. She was anemic, had a colonoscopy in December 2018, bleeding areas noted and were cauterized, pt states, and she has has not seemed to be significantly anemic since this.  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   She denies any known history of stroke or TIA. Specifically she denies a history of amaurosis fugax or monocular blindness, unilateral facial drooping, hemiplegia, or receptive or expressive aphasia.    Pt states she was evaluated by a neurologist, Dr. Krista Blue, states it did not seem she had a stroke or TIA, seems that her syncope was due to her CAD and anemia; pt had been released to follow up as needed with Dr. Krista Blue.  However, pt states that she has been experiencing dizziness since about July 2019, increasing in  frequency, only when she extends her head backward and reaches overhead to change a light bulb, or reaching for something on a high shelf. This action will also elicit a tingling, stabbing type pain at the right side of her neck.  She denies claudication like symptoms in her legs with walking.   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 She takes OxyContin for pain, now managed by her PCP per pt.    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    Diabetic: no Tobacco use: smoker  (1 ppd, started at age 89 yrs)  Pt meds include: Statin : yes ASA: yes Other anticoagulants/antiplatelets: Xarelto, hx of DVT and PE.    Past Medical History:  Diagnosis Date  . Anemia   . Anxiety   . Arthritis    pt. denies  . Bell's palsy   . CAD (coronary artery disease)    a. Cath 12/2014 - mild-moderate non-obstructive CAD with 60% mid RCA, 50% mid Cx-distal Cx, 30% distal LAD, 25% D2, EF 55-65%. b. 02/2017: NSTEMI with cath showing 60% LM stenosis and 100% RCA stenosis --> CT Surgery consulted with CABG on 02/21/2017 (LIMA-LAD, SVG-OM, and SVG-PDA)  . Depression   . Dizziness   . DVT (deep venous thrombosis) ([place])    on  Xarelto  . Dyspnea    W/ EXERTION pt. denies  . GERD (gastroesophageal reflux disease)   . Heart murmur    DX   AT BIRTH  . Hyperlipidemia   . Hypertension   . 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 Myocardial infarction ([place])   . Neuropathy   . PE (pulmonary embolism)   . Tobacco abuse     Social History Social History   Tobacco Use  . Smoking status: Current Every Day Smoker    Packs/day: 1.00    Years: 50.00    Pack years: 50.00    Types: Cigarettes    Start date: 06/18/1967  . Smokeless tobacco: Never Used  . Tobacco comment: 3-4 a day  Substance Use Topics  . Alcohol use: No    Alcohol/week: 0.0 standard drinks  .  Drug use: No    Family History Family History  Problem Relation Age of Onset  . Heart attack Mother   . AAA (abdominal aortic aneurysm) Mother   . Heart attack Father   . Heart disease Father   . Heart attack Brother        CABG  . Heart disease Brother   . Heart attack Brother        CABG  . Heart attack Brother        CABG  . Ovarian cancer Maternal Grandmother   . Heart disease Maternal Grandfather   . Dementia Paternal Grandmother     Surgical History Past Surgical History:  Procedure Laterality Date  . ABDOMINAL HYSTERECTOMY  1974  . CARDIAC CATHETERIZATION N/A 01/08/2015   Procedure: Left Heart Cath and Coronary Angiography;  Surgeon: Jolaine Artist, MD;  Location: [facility] [facility];  Service: Cardiovascular;  Laterality: N/A;  . CAROTID ARTERY ANGIOPLASTY     stent right side  . CARPAL TUNNEL RELEASE     RIGHT  . COLONOSCOPY WITH PROPOFOL N/A 02/16/2017   Procedure: COLONOSCOPY WITH PROPOFOL;  Surgeon: [name], MD;  Location: [facility];  Service: Endoscopy;  Laterality: N/A;  . CORONARY ARTERY BYPASS GRAFT N/A 02/21/2017   Procedure: CORONARY ARTERY BYPASS GRAFTING (CABG) times three. LIMA to LAD, SVG to OM and SVG to PDA;  Surgeon: Melrose Nakayama, MD;  Location: [place];  Service: Open Heart Surgery;  Laterality: N/A;  . CORONARY STENT PLACEMENT Right 1980  . ENTEROSCOPY N/A 06/14/2017   Procedure: ENTEROSCOPY;  Surgeon: [name], MD;  Location: [facility];  Service: Endoscopy;  Laterality: N/A;  . ESOPHAGOGASTRODUODENOSCOPY (EGD) WITH PROPOFOL N/A 02/16/2017   Procedure: ESOPHAGOGASTRODUODENOSCOPY (EGD) WITH PROPOFOL;  Surgeon: [name], MD;  Location: [facility];  Service: Endoscopy;  Laterality: N/A;  . GIVENS CAPSULE STUDY N/A 05/08/2017   Procedure: GIVENS CAPSULE STUDY;  Surgeon: [name], MD;  Location: [place];  Service: Endoscopy;  Laterality: N/A;  . KNEE ARTHROSCOPY W/ MENISCAL REPAIR     LEFT KNEE    FORMED DVT (SURG TO REMOVE BLOOD CLOT)  . LEFT HEART CATH AND CORONARY ANGIOGRAPHY N/A 02/19/2017   Procedure: LEFT HEART CATH AND CORONARY ANGIOGRAPHY;  Surgeon: Nelva Bush, MD;  Location: [place] [facility];  Service: Cardiovascular;  Laterality: N/A;  . TEE WITHOUT CARDIOVERSION N/A 02/21/2017   Procedure: TRANSESOPHAGEAL ECHOCARDIOGRAM (TEE);  Surgeon: Melrose Nakayama, MD;  Location: [place];  Service: Open Heart Surgery;  Laterality: N/A;  . TONSILLECTOMY      Allergies  Allergen Reactions  .  Bee Venom Anaphylaxis    Current Outpatient Medications  Medication Sig Dispense Refill  . allopurinol (ZYLOPRIM) 300 MG tablet Take 300 mg by mouth daily.    Marland Kitchen aspirin EC 81 MG EC tablet Take 1 tablet (81 mg total) by mouth daily.    Marland Kitchen atorvastatin (LIPITOR) 40 MG tablet Take 1 tablet (40 mg total) by mouth daily. 90 tablet 3  . calcium carbonate (TUMS - DOSED IN MG ELEMENTAL CALCIUM) 500 MG chewable tablet Chew 2 tablets by mouth 3 (three) times daily with meals.     . Cholecalciferol (VITAMIN D) 2000 units CAPS Take 2,000 Units by mouth daily.    . DULoxetine (CYMBALTA) 30 MG capsule Take 30 mg by mouth daily.    Marland Kitchen FeFum-FePoly-FA-B Cmp-C-Biot (INTEGRA PLUS) CAPS Take 1 capsule daily with a meal 30 capsule 3  . gabapentin (NEURONTIN) 600 MG tablet Take 300-600 mg by mouth See admin instructions. Take 300 mg twice daily IN THE MORNING AND AT NOON AND  600 mg at bedtime.    Marland Kitchen lisinopril (PRINIVIL,ZESTRIL) 10 MG tablet Take 5 mg by mouth daily.    . metoprolol tartrate (LOPRESSOR) 25 MG tablet Take 0.5 tablets (12.5 mg total) by mouth 2 (two) times daily. 90 tablet 3  . Omega-3 Fatty Acids (FISH OIL) 1360 MG CAPS Take by mouth.    . Oxycodone HCl 10 MG TABS Take 10 mg by mouth 3 (three) times daily as needed. For pain    . ranitidine (ZANTAC) 150 MG tablet Take 1 tablet (150 mg total) by mouth 2 (two) times daily. 60 tablet 6  . rivaroxaban (XARELTO) 20 MG TABS tablet Take 20 mg by  mouth daily with supper.    . Vitamin D, Ergocalciferol, (DRISDOL) 50000 units CAPS capsule Take one capsule weekly x 8 weeks then convert to OTC Vitamin D 2000 units daily thereafter.    . varenicline (CHANTIX STARTING MONTH PAK) 0.5 MG X 11 & 1 MG X 42 tablet Take one 0.5 mg tablet by mouth once daily for 3 days, then increase to one 0.5 mg tablet twice daily for 4 days, then increase to one 1 mg tablet twice daily. (Patient not taking: Reported on 12/10/2017) 53 tablet 0   No current facility-administered medications for this visit.     Review of Systems : See HPI for pertinent positives and negatives.  Physical Examination  Vitals:   12/10/17 1221 12/10/17 1223  BP: (!) 176/84 (!) 167/86  Pulse: 72   Resp: 18   Temp: 97.8 F (36.6 C)   TempSrc: Oral   SpO2: 97%   Weight: 226 lb (102.5 kg)   Height: 5\' 4"  (1.626 m)    Body mass index is 38.79 kg/m.  General: WDWN obese female in NAD GAIT: normal Eyes: PERRLA HENT: No gross abnormalities. Edentulous.  Pulmonary:  Respirations are non-labored, good air movement in all fields, CTAB, no rales, rhonchi, or wheezing. Cardiac: regular rhythm, + murmur.  VASCULAR EXAM Carotid Bruits Right Left   Negative Negative     Abdominal aortic pulse is not palpable. Radial pulses are 2+ palpable and equal.  LE Pulses Right Left       POPLITEAL  not palpable   not palpable       POSTERIOR TIBIAL  2+ palpable   2+ palpable        DORSALIS PEDIS      ANTERIOR TIBIAL 2+ palpable  2+ palpable     Gastrointestinal: soft, nontender, BS WNL, no r/g, no palpable masses. Musculoskeletal: no muscle atrophy/wasting. M/S 5/5 throughout, extremities without ischemic changes. Left lower leg is slightly larger than right. No pitting or non pitting edema.  Skin: No rashes, no ulcers, no cellulitis.   Neurologic:  A&O X 3;  appropriate affect, sensation is normal; speech is normal, CN 2-12 intact, pain and light touch intact in extremities, motor exam as listed above. Psychiatric: Normal thought content, mood appropriate to clinical situation.    Assessment: Jennifer [name] is a 68 y.o. female who is s/p Trans carotid artery revascularization (TCAR) right side on 11-01-16. She has no history of stroke or TIA.  Her atherosclerotic risk factors include 50 year hx of smoking (active), CAD, uncontrolled hypertension, and obesity. Fortunatley she does not have DM.  She takes a daily ASA and a statin. She takes Xarelto (long term use of anticoagulant) for a hx of recurrent DVT and PE.  She needs maximal medical management to address her atherosclerotic risk factors.   Over 3 minutes was spent counseling patient re smoking cessation, and patient was given several free resources re smoking cessation.   Pt states that she has been experiencing dizziness since about July 2019, increasing in frequency, only when she extends her head backward and reaches overhead to change a light bulb, or reaching for something on a high shelf. This action will also elicit a tingling, stabbing type pain at the right side of her neck.  I advised pt to let me know if this worsens, and I will refer her back to Dr. Krista Blue for an evaluation of this new symptom, or to her spine specialist to evaluate her c-spine, since her PCP is now managing her pain and prescribing her OxyContin for her chronic low back pain.     DATA Carotid Duplex (12-10-17): Right ICA: (s/p stent), 40-59% stenosis, no significant stenosis in the stent  Left ICA: 40-59% stenosis Bilateral vertebral artery flow is antegrade.  Bilateral subclavian artery flow was disturbed.  No change compared to the exam on 05-17-17.     Plan: Follow-up in 6 months with Carotid Duplex scan.    I discussed in depth with the patient the nature of atherosclerosis, and emphasized the  importance of maximal medical management including strict control of blood pressure, blood glucose, and lipid levels, obtaining regular exercise, and cessation of smoking.  The patient is aware that without maximal medical management the underlying atherosclerotic disease process will progress, limiting the benefit of any interventions. The patient was given information about stroke prevention and what symptoms should prompt the patient to seek immediate medical care. Thank you for allowing [place] to participate in this patient's care.  Clemon Chambers, RN, MSN, FNP-C [facility] Office: [facility] Physician: Trula Slade  12/10/17 12:32 PM

## 2017-12-18 ENCOUNTER — Telehealth: Payer: Self-pay | Admitting: *Deleted

## 2017-12-18 NOTE — Telephone Encounter (Signed)
-----   Message from Erma Heritage, [place] sent at 12/16/2017  4:25 PM EDT ----- Regarding: BP Follow-Up Kisha,   Can you please reach out to this patient and see if she can follow BP in the ambulatory setting as we were notified by Vascular that it was increased at the time of her last visit? If unable to check BP at home, please arrange for her to have a Nurse Visit for a BP check within the next few weeks. She is a Diplomatic Services operational officer patient and was suppose to follow-up in 6 months from her visit in 05/2017 but it does not look like this has been scheduled.   Thanks for your help!  Best,  [place]   ----- Message ----- From: Ilean China Sent: 12/14/2017   7:45 AM EDT To: Erma Heritage, PA-C  [place] this patient is followed in [place] and Vinnie Level Nickel NP sent me this, I guess the pt's B/P is not controlled. I saw her once 18 months ago, I thought it would be better if this was handled by her primary cardiologist/APP. Would you mind looking into this?  Kerin Ransom PA-C 12/14/2017 7:51 AM ----- Message ----- From: Viann Fish, NP Sent: 12/13/2017   7:03 PM EDT To: Erlene Quan, PA-C  Luke, Maybe she can keep a record of her home blood pressures and bring it to you for review. Thank you for looking into this, Vinnie Level  ----- Message ----- From: Ilean China Sent: 12/12/2017   7:46 AM EDT To: Sharmon Leyden Nickel, NP  Thanks for the notification!   I reviewed her chart. Her B/P was OK when I saw her ans she has had syncope-still not clear what the cause is. I'll have her come in for a f/u B/P.  Lurena Joiner ----- Message ----- From: Viann Fish, NP Sent: 12/10/2017   6:52 PM EDT To: Erlene Quan, PA-C, Herminio Commons, MD, #

## 2017-12-18 NOTE — Telephone Encounter (Signed)
Spoke with pt who states that she is able to check blood pressure at home. BP log mailed to pt to compete and return. Appointment made with Maude Leriche, PA-C for 11/14.

## 2018-01-01 DIAGNOSIS — Z1389 Encounter for screening for other disorder: Secondary | ICD-10-CM | POA: Diagnosis not present

## 2018-01-01 DIAGNOSIS — I10 Essential (primary) hypertension: Secondary | ICD-10-CM | POA: Diagnosis not present

## 2018-01-24 ENCOUNTER — Ambulatory Visit: Payer: Medicare Other | Admitting: Student

## 2018-01-24 ENCOUNTER — Encounter: Payer: Self-pay | Admitting: Student

## 2018-01-24 VITALS — BP 172/84 | HR 78 | Ht 64.0 in | Wt 223.8 lb

## 2018-01-24 DIAGNOSIS — I82592 Chronic embolism and thrombosis of other specified deep vein of left lower extremity: Secondary | ICD-10-CM

## 2018-01-24 DIAGNOSIS — I10 Essential (primary) hypertension: Secondary | ICD-10-CM | POA: Diagnosis not present

## 2018-01-24 DIAGNOSIS — E785 Hyperlipidemia, unspecified: Secondary | ICD-10-CM

## 2018-01-24 DIAGNOSIS — Z720 Tobacco use: Secondary | ICD-10-CM | POA: Diagnosis not present

## 2018-01-24 DIAGNOSIS — I2510 Atherosclerotic heart disease of native coronary artery without angina pectoris: Secondary | ICD-10-CM | POA: Diagnosis not present

## 2018-01-24 NOTE — Progress Notes (Signed)
Cardiology Office Note    Date:  01/24/2018   ID:  Jennifer [name], DOB 05/11/1950, MRN [id number]  PCP:  Redmond School, MD  Cardiologist: Kate Sable, MD    Chief Complaint  Patient presents with  . Follow-up    overdue visit    History of Present Illness:    Jennifer [name] is a 67 y.o. female with past medical history of CAD (s/p CABG in 02/2017 with LIMA-LAD, SVG-OM, and SVG-PDA), history of PE/DVT (on Xarelto), HTN, HLD, and tobacco use presents to the office today for overdue follow-up.  She was examined by Dr. Bronson Ing in 05/2017 and reported having recent episodes of anemia and had recently undergone a transfusion and noted improvement in her fatigue. Was being followed closely by GI and recent capsule endoscopy had shown 3 small bowel AVMs. Denied any recent chest pain or dyspnea on exertion at that time. She was continuing to smoke approximately 1 pack of cigarettes per day and was provided with a Chantix starter kit at the time of her visit. Was continued on her current cardiac medication regimen.   In talking with the patient today, she denies any recent chest pain or dyspnea on exertion. No recent orthopnea, PND, or lower extremity edema. She has been struggling recently with sinus congestion and has been taking Mucinex for this. She is unsure if she has been taking plain Mucinex or Mucinex D. She does not have a blood pressure cuff at home but does plan to purchase one later today. BP is elevated at 180/88 on initial check, slightly improved to 172/84 on recheck.  She reports good compliance with her medication regimen and denies missing any recent doses. Remains on Xarelto for anticoagulation and denies any evidence of active bleeding.  She was unable to fully quit smoking with the use of Chantix but has reduced her use from 1.5 packs per day down to 3 to 4 cigarettes per day.  Past Medical History:  Diagnosis Date  . Anemia   . Anxiety   . Arthritis    pt.  denies  . Bell's palsy   . CAD (coronary artery disease)    a. Cath 12/2014 - mild-moderate non-obstructive CAD with 60% mid RCA, 50% mid Cx-distal Cx, 30% distal LAD, 25% D2, EF 55-65%. b. 02/2017: NSTEMI with cath showing 60% LM stenosis and 100% RCA stenosis --> CT Surgery consulted with CABG on 02/21/2017 (LIMA-LAD, SVG-OM, and SVG-PDA)  . Depression   . Dizziness   . DVT (deep venous thrombosis) (HCC)    on Xarelto  . Dyspnea    W/ EXERTION pt. denies  . GERD (gastroesophageal reflux disease)   . Heart murmur    DX   AT BIRTH  . Hyperlipidemia   . Hypertension   . 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 Myocardial infarction ([place])   . Neuropathy   . PE (pulmonary embolism)   . Tobacco abuse     Past Surgical History:  Procedure Laterality Date  . ABDOMINAL HYSTERECTOMY  1974  . CARDIAC CATHETERIZATION N/A 01/08/2015   Procedure: Left Heart Cath and Coronary Angiography;  Surgeon: Jolaine Artist, MD;  Location: [place] [facility];  Service: Cardiovascular;  Laterality: N/A;  . CAROTID ARTERY ANGIOPLASTY     stent right side  . CARPAL TUNNEL RELEASE     RIGHT  . COLONOSCOPY WITH PROPOFOL N/A 02/16/2017   Procedure: COLONOSCOPY WITH PROPOFOL;  Surgeon: [name], MD;  Location: [facility];  Service: Endoscopy;  Laterality: N/A;  . CORONARY ARTERY BYPASS GRAFT N/A 02/21/2017   Procedure: CORONARY ARTERY BYPASS GRAFTING (CABG) times three. LIMA to LAD, SVG to OM and SVG to PDA;  Surgeon: Melrose Nakayama, MD;  Location: [place];  Service: Open Heart Surgery;  Laterality: N/A;  . CORONARY STENT PLACEMENT Right 1980  . ENTEROSCOPY N/A 06/14/2017   Procedure: ENTEROSCOPY;  Surgeon: [name], MD;  Location: [facility];  Service: Endoscopy;  Laterality: N/A;  . ESOPHAGOGASTRODUODENOSCOPY (EGD) WITH PROPOFOL N/A 02/16/2017   Procedure: ESOPHAGOGASTRODUODENOSCOPY (EGD) WITH PROPOFOL;  Surgeon: [name], MD;  [facility];  Service:  Endoscopy;  Laterality: N/A;  . GIVENS CAPSULE STUDY N/A 05/08/2017   Procedure: GIVENS CAPSULE STUDY;  Surgeon: [name], MD;  Location: [place];  Service: Endoscopy;  Laterality: N/A;  . KNEE ARTHROSCOPY W/ MENISCAL REPAIR     LEFT KNEE   FORMED DVT (SURG TO REMOVE BLOOD CLOT)  . LEFT HEART CATH AND CORONARY ANGIOGRAPHY N/A 02/19/2017   Procedure: LEFT HEART CATH AND CORONARY ANGIOGRAPHY;  Surgeon: Nelva Bush, MD;  Location: [place] [facility];  Service: Cardiovascular;  Laterality: N/A;  . TEE WITHOUT CARDIOVERSION N/A 02/21/2017   Procedure: TRANSESOPHAGEAL ECHOCARDIOGRAM (TEE);  Surgeon: Melrose Nakayama, MD;  Location: [place];  Service: Open Heart Surgery;  Laterality: N/A;  . TONSILLECTOMY      Current Medications: Outpatient Medications Prior to Visit  Medication Sig Dispense Refill  . allopurinol (ZYLOPRIM) 300 MG tablet Take 300 mg by mouth daily.    Marland Kitchen aspirin EC 81 MG EC tablet Take 1 tablet (81 mg total) by mouth daily.    Marland Kitchen atorvastatin (LIPITOR) 40 MG tablet Take 1 tablet (40 mg total) by mouth daily. 90 tablet 3  . calcium carbonate (TUMS - DOSED IN MG ELEMENTAL CALCIUM) 500 MG chewable tablet Chew 2 tablets by mouth 3 (three) times daily with meals.     . Cholecalciferol (VITAMIN D) 2000 units CAPS Take 2,000 Units by mouth daily.    . DULoxetine (CYMBALTA) 30 MG capsule Take 30 mg by mouth daily.    Marland Kitchen FeFum-FePoly-FA-B Cmp-C-Biot (INTEGRA PLUS) CAPS Take 1 capsule daily with a meal 30 capsule 3  . gabapentin (NEURONTIN) 600 MG tablet Take 300-600 mg by mouth See admin instructions. Take 300 mg twice daily IN THE MORNING AND AT NOON AND  600 mg at bedtime.    Marland Kitchen lisinopril (PRINIVIL,ZESTRIL) 20 MG tablet     . metoprolol tartrate (LOPRESSOR) 25 MG tablet Take 0.5 tablets (12.5 mg total) by mouth 2 (two) times daily. 90 tablet 3  . Omega-3 Fatty Acids (FISH OIL) 1360 MG CAPS Take by mouth.    . Oxycodone HCl 10 MG TABS Take 10 mg by mouth 3 (three) times  daily as needed. For pain    . ranitidine (ZANTAC) 150 MG tablet Take 1 tablet (150 mg total) by mouth 2 (two) times daily. 60 tablet 6  . rivaroxaban (XARELTO) 20 MG TABS tablet Take 20 mg by mouth daily with supper.    . varenicline (CHANTIX STARTING MONTH PAK) 0.5 MG X 11 & 1 MG X 42 tablet Take one 0.5 mg tablet by mouth once daily for 3 days, then increase to one 0.5 mg tablet twice daily for 4 days, then increase to one 1 mg tablet twice daily. 53 tablet 0  . Vitamin D, Ergocalciferol, (DRISDOL) 50000 units CAPS capsule Take one capsule weekly x  8 weeks then convert to OTC Vitamin D 2000 units daily thereafter.    Marland Kitchen lisinopril (PRINIVIL,ZESTRIL) 10 MG tablet Take 5 mg by mouth daily.     No facility-administered medications prior to visit.      Allergies:   Bee venom   Social History   Socioeconomic History  . Marital status: Divorced    Spouse name: Not on file  . Number of children: 2  . Years of education: 47  . Highest education level: Not on file  Occupational History  . Occupation: Retired  Scientific laboratory technician  . Financial resource strain: Not on file  . Food insecurity:    Worry: Not on file    Inability: Not on file  . Transportation needs:    Medical: Not on file    Non-medical: Not on file  Tobacco Use  . Smoking status: Current Every Day Smoker    Packs/day: 0.25    Years: 50.00    Pack years: 12.50    Types: Cigarettes    Start date: 06/18/1967  . Smokeless tobacco: Never Used  . Tobacco comment: 3-4 a day  Substance and Sexual Activity  . Alcohol use: No    Alcohol/week: 0.0 standard drinks  . Drug use: No  . Sexual activity: Not Currently  Lifestyle  . Physical activity:    Days per week: Not on file    Minutes per session: Not on file  . Stress: Not on file  Relationships  . Social connections:    Talks on phone: Not on file    Gets together: Not on file    Attends religious service: Not on file    Active member of club or organization: Not on file     Attends meetings of clubs or organizations: Not on file    Relationship status: Not on file  Other Topics Concern  . Not on file  Social History Narrative   Lives at home alone.   Right-handed.   2 cups caffeine per day.     Family History:  The patient's family history includes AAA (abdominal aortic aneurysm) in her mother; Dementia in her paternal grandmother; Heart attack in her brother, brother, brother, father, and mother; Heart disease in her brother, father, and maternal grandfather; Ovarian cancer in her maternal grandmother.   Review of Systems:   Please see the history of present illness.     General:  No chills, fever, night sweats or weight changes.  Cardiovascular:  No chest pain, dyspnea on exertion, edema, orthopnea, palpitations, paroxysmal nocturnal dyspnea. Dermatological: No rash, lesions/masses Respiratory: No cough, dyspnea. Positive for sinus congestion.  Urologic: No hematuria, dysuria Abdominal:   No nausea, vomiting, diarrhea, bright red blood per rectum, melena, or hematemesis Neurologic:  No visual changes, wkns, changes in mental status. All other systems reviewed and are otherwise negative except as noted above.   Physical Exam:    VS:  BP (!) 180/88   Pulse 78   Ht 5' 4" (1.626 m)   Wt 223 lb 12.8 oz (101.5 kg)   SpO2 97%   BMI 38.42 kg/m    General: Well developed, well nourished Caucasian female appearing in no acute distress. Head: Normocephalic, atraumatic, sclera non-icteric, no xanthomas, nares are without discharge.  Neck: No carotid bruits. JVD not elevated.  Lungs: Respirations regular and unlabored, without wheezes or rales.  Heart: Regular rate and rhythm. No S3 or S4.  No murmur, no rubs, or gallops appreciated. Abdomen: Soft, non-tender, non-distended with normoactive  bowel sounds. No hepatomegaly. No rebound/guarding. No obvious abdominal masses. Msk:  Strength and tone appear normal for age. No joint deformities or  effusions. Extremities: No clubbing or cyanosis. No lower extremity edema.  Distal pedal pulses are 2+ bilaterally. Neuro: Alert and oriented X 3. Moves all extremities spontaneously. No focal deficits noted. Psych:  Responds to questions appropriately with a normal affect. Skin: No rashes or lesions noted  Wt Readings from Last 3 Encounters:  01/24/18 223 lb 12.8 oz (101.5 kg)  12/10/17 226 lb (102.5 kg)  06/14/17 205 lb (93 kg)     Studies/Labs Reviewed:   EKG:  EKG is not ordered today.   Recent Labs: 02/13/2017: ALT 16 02/22/2017: Magnesium 2.4 04/03/2017: BUN 9; Creatinine, Ser 0.88; Potassium 3.5; Sodium 140 09/14/2017: Hemoglobin 14.7; Platelets 272.0   Lipid Panel    Component Value Date/Time   CHOL 129 02/14/2017 0841   TRIG 162 (H) 02/14/2017 0841   HDL 30 (L) 02/14/2017 0841   CHOLHDL 4.3 02/14/2017 0841   VLDL 32 02/14/2017 0841   LDLCALC 67 02/14/2017 0841    Additional studies/ records that were reviewed today include:   Cardiac Catheterization: 12/2016 Conclusions: 1. Significant multivessel coronary artery disease, including 60% ostial LMCA stenosis with catheter dampening, 70% dominant OM stenosis, and subacute versus chronic occlusion of the mid RCA with faint bridging and left-to-right collaterals. 2. Normal left ventricular contraction with mildly elevated filling pressure.  Recommendations: 1. Cardiac surgery consultation for CABG. 2. Continue aggressive secondary prevention, including aspirin and statin therapy. We will defer adding heparin at this time, given that infarct occurred more than 48 hours ago, the patient is asymptomatic, and she presented with severe iron deficiency anemia.  Echocardiogram: 02/2017 Study Conclusions  - Left ventricle: The cavity size was normal. Wall thickness was   increased in a pattern of mild LVH. Systolic function was normal.   The estimated ejection fraction was in the range of 60% to 65%.   Wall motion was  normal; there were no regional wall motion   abnormalities. Features are consistent with a pseudonormal left   ventricular filling pattern, with concomitant abnormal relaxation   and increased filling pressure (grade 2 diastolic dysfunction). - Aortic valve: Valve area (VTI): 1.92 cm^2. Valve area (Vmax):   1.91 cm^2. Valve area (Vmean): 1.55 cm^2. - Mitral valve: There was mild regurgitation.   Assessment:    1. Coronary artery disease involving native coronary artery of native heart without angina pectoris   2. 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 Essential hypertension   4. Hyperlipidemia LDL goal <70   5. Tobacco abuse disorder      Plan:   In order of problems listed above:  1. CAD - s/p CABG in 02/2017 with LIMA-LAD, SVG-OM, and SVG-PDA. She denies any recent chest pain or dyspnea on exertion. Has overall been doing well from a cardiac perspective.  - she has remained on ASA while also on Xarelto for anticoagulation given history of DVT. Denies any evidence of active bleeding. Continue BB and statin therapy.   2. History of DVT - she remains on Xarelto 30m daily. Denies any evidence of active bleeding.   3. HTN - BP is elevated at 180/88 on initial check, slightly improved to 172/84 on recheck but still significantly above goal. She is unsure if she has been taking Mucinex or Mucinex-D in the setting of recent sinus congestion. Was advised to use plain  Mucinex and follow BP in the ambulatory setting. If this remains elevated, would plan to titrate Lisinopril from 750m daly to 412mdaily with plans for a repeat BMET in 2 weeks following titration of this. Continue Lopressor 12.50m12mID. Will arrange for closer follow-up as she may require additional medical therapy pending BP response to the above changes. She does plan to purchase a BP cuff and a log to record her readings on was provided at today's visit.   4. HLD - followed by  PCP. Goal LDL is < 70 with known CAD. Remains on Atorvastatin 47m6mily.   5. Tobacco Use - was previously smoking 1.5 ppd but was able to reduce this to 3-4 cigarettes per day with the use of Chantix. Congratulated on her reduction with full cessation advised.    Medication Adjustments/Labs and Tests Ordered: Current medicines are reviewed at length with the patient today.  Concerns regarding medicines are outlined above.  Medication changes, Labs and Tests ordered today are listed in the Patient Instructions below. Patient Instructions  Medication Instructions:  Your physician recommends that you continue on your current medications as directed. Please refer to the Current Medication list given to you today.  If you need a refill on your cardiac medications before your next appointment, please call your pharmacy.   Lab work: NONE  If you have labs (blood work) drawn today and your tests are completely normal, you will receive your results only by: . MyMarland Kitchenhart Message (if you have MyChart) OR . A paper copy in the mail If you have any lab test that is abnormal or we need to change your treatment, we will call you to review the results.  Testing/Procedures: NONE   Follow-Up: [facility] and your health needs are our priority.  As part of our continuing mission to provide you with exceptional heart care, we have created designated Provider Care Teams.  These Care Teams include your primary Cardiologist (physician) and Advanced Practice Providers (APPs -  Physician Assistants and Nurse Practitioners) who all work together to provide you with the care you need, when you need it. You will need a follow up appointment in 6-8 weeks.  Please call our office 2 months in advance to schedule this appointment.  You may see SureKate Sable or one of the following Advanced Practice Providers on your designated Care Team:   BritBernerd Pho-C ([facility]rm[facility]inda BarriosA-C ([facility] Other Special Instructions Will Be Listed Below (If Applicable). Your physician has requested that you regularly monitor and record your blood pressure readings at home. Please use the same machine at the same time of day to check your readings and record them to bring to your follow-up visit.  Please call office and let us kKoreaw if you are taking Mucinex DM.   Thank you for choosing [facility]   Signed, BritErma Heritage-C  01/24/2018 8:58 PM    [facility] [id number] [place] [id number]: [phone]

## 2018-01-24 NOTE — Patient Instructions (Signed)
Medication Instructions:  Your physician recommends that you continue on your current medications as directed. Please refer to the Current Medication list given to you today.  If you need a refill on your cardiac medications before your next appointment, please call your pharmacy.   Lab work: NONE  If you have labs (blood work) drawn today and your tests are completely normal, you will receive your results only by: Marland Kitchen MyChart Message (if you have MyChart) OR . A paper copy in the mail If you have any lab test that is abnormal or we need to change your treatment, we will call you to review the results.  Testing/Procedures: NONE   Follow-Up: At [facility], you and your health needs are our priority.  As part of our continuing mission to provide you with exceptional heart care, we have created designated Provider Care Teams.  These Care Teams include your primary Cardiologist (physician) and Advanced Practice Providers (APPs -  Physician Assistants and Nurse Practitioners) who all work together to provide you with the care you need, when you need it. You will need a follow up appointment in 6-8 weeks.  Please call our office 2 months in advance to schedule this appointment.  You may see Kate Sable, MD or one of the following Advanced Practice Providers on your designated Care Team:   Bernerd Pho, PA-C [facility]) . Ermalinda Barrios, PA-C ([place])  Any Other Special Instructions Will Be Listed Below (If Applicable). Your physician has requested that you regularly monitor and record your blood pressure readings at home. Please use the same machine at the same time of day to check your readings and record them to bring to your follow-up visit.  Please call office and let us know if you are taking Mucinex DM.   Thank you for choosing [place]!

## 2018-01-25 ENCOUNTER — Telehealth: Payer: Self-pay | Admitting: Student

## 2018-01-25 ENCOUNTER — Ambulatory Visit: Payer: Medicare Other | Admitting: Gastroenterology

## 2018-01-25 DIAGNOSIS — E785 Hyperlipidemia, unspecified: Secondary | ICD-10-CM

## 2018-01-25 DIAGNOSIS — I10 Essential (primary) hypertension: Secondary | ICD-10-CM

## 2018-01-25 MED ORDER — LISINOPRIL 40 MG PO TABS: 40.0000 mg | ORAL_TABLET | Freq: Every day | ORAL | 3 refills | Status: DC

## 2018-01-25 NOTE — Telephone Encounter (Signed)
Patient was told by Jennifer [name] to call back to advise if she was taking Mucinex in blue box or green box. She is taking blue box. States that her BP was elevated this AM. / tg

## 2018-01-25 NOTE — Telephone Encounter (Signed)
Called pt. No answer, left message for pt to return call. Left detailed message on pt's machine. Mail lab skips & instructions.

## 2018-01-25 NOTE — Telephone Encounter (Signed)
   Thank you for the update. Given her readings in clinic yesterday and elevated BP this morning, would recommend she titrate Lisinopril from 20 mg daily to 40 mg daily (can use two pills to make 40mg  daily until she needs a refill). She will need a repeat BMET in 2 weeks to assess kidney function and potassium levels.  Please have her continue to follow BP in the ambulatory setting.  Signed, Erma Heritage, PA-C 01/25/2018, 10:25 AM Pager: [phone]

## 2018-01-28 ENCOUNTER — Encounter: Payer: Self-pay | Admitting: Gastroenterology

## 2018-01-28 ENCOUNTER — Other Ambulatory Visit (INDEPENDENT_AMBULATORY_CARE_PROVIDER_SITE_OTHER): Payer: Medicare Other

## 2018-01-28 ENCOUNTER — Ambulatory Visit: Payer: Medicare Other | Admitting: Gastroenterology

## 2018-01-28 VITALS — BP 174/70 | HR 80 | Ht 63.0 in | Wt 225.5 lb

## 2018-01-28 DIAGNOSIS — D509 Iron deficiency anemia, unspecified: Secondary | ICD-10-CM

## 2018-01-28 LAB — CBC WITH DIFFERENTIAL/PLATELET
Basophils Absolute: 0.1 10*3/uL (ref 0.0–0.1)
Basophils Relative: 0.7 % (ref 0.0–3.0)
Eosinophils Absolute: 0.2 10*3/uL (ref 0.0–0.7)
Eosinophils Relative: 1.9 % (ref 0.0–5.0)
HCT: 44.4 % (ref 36.0–46.0)
Hemoglobin: 15 g/dL (ref 12.0–15.0)
Lymphocytes Relative: 17.8 % (ref 12.0–46.0)
Lymphs Abs: 1.7 10*3/uL (ref 0.7–4.0)
MCHC: 33.7 g/dL (ref 30.0–36.0)
MCV: 86.4 fl (ref 78.0–100.0)
Monocytes Absolute: 0.5 10*3/uL (ref 0.1–1.0)
Monocytes Relative: 5.1 % (ref 3.0–12.0)
Neutro Abs: 7.1 10*3/uL (ref 1.4–7.7)
Neutrophils Relative %: 74.5 % (ref 43.0–77.0)
Platelets: 288 10*3/uL (ref 150.0–400.0)
RBC: 5.14 Mil/uL — ABNORMAL HIGH (ref 3.87–5.11)
RDW: 13.6 % (ref 11.5–15.5)
WBC: 9.5 10*3/uL (ref 4.0–10.5)

## 2018-01-28 NOTE — Progress Notes (Signed)
Review of pertinent gastrointestinal problems: 1. IDA workup; on chronic blood thinner xarelto, Hb around 8:  EGD 02/2017 Dr. Ardis Hughs was normal, colonoscopy 02/2017 Dr. Ardis Hughs found diverticulosis and 2 subCM adenomas. Capsule endoscopy  04/2017: 3 obvious small bowel AVMs; two seemingly in proximal small bowel, the third in mid small bowel.  Enteroscopy 06/2017 Dr. Ardis Hughs found two non-bleeding proximal-mid jejunum, ablated with APC.  Labs  09/2017 Hb 14.7, MCV normal, recommended she stay on daily iron supplement.  HPI: This is a very pleasant 67 year old woman whom I last saw several months ago.  She tells me she has very rare dark stools.  No overt obvious red rectal bleeding.  No significant abdominal pains.  She really feels overall well.  Has gained 15 since heart surgery 02/2017, she will likely need to remain on Xarelto or similar blood thinner indefinitely  Chief complaint is iron deficiency anemia previously  ROS: complete GI ROS as described in HPI, all other review negative.  Constitutional:  No unintentional weight loss   Past Medical History:  Diagnosis Date  . Anemia   . Anxiety   . Arthritis    pt. denies  . Bell's palsy   . CAD (coronary artery disease)    a. Cath 12/2014 - mild-moderate non-obstructive CAD with 60% mid RCA, 50% mid Cx-distal Cx, 30% distal LAD, 25% D2, EF 55-65%. b. 02/2017: NSTEMI with cath showing 60% LM stenosis and 100% RCA stenosis --> CT Surgery consulted with CABG on 02/21/2017 (LIMA-LAD, SVG-OM, and SVG-PDA)  . Depression   . Dizziness   . DVT (deep venous thrombosis) (HCC)    on Xarelto  . Dyspnea    W/ EXERTION pt. denies  . GERD (gastroesophageal reflux disease)   . Heart murmur    DX   AT BIRTH  . Hyperlipidemia   . Hypertension   . 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 Myocardial infarction ([place])   . Neuropathy   . PE (pulmonary embolism)   . Tobacco abuse     Past Surgical History:  Procedure Laterality Date  .  ABDOMINAL HYSTERECTOMY  1974  . CARDIAC CATHETERIZATION N/A 01/08/2015   Procedure: Left Heart Cath and Coronary Angiography;  Surgeon: Jolaine Artist, MD;  Location: [place] [facility];  Service: Cardiovascular;  Laterality: N/A;  . CAROTID ARTERY ANGIOPLASTY     stent right side  . CARPAL TUNNEL RELEASE     RIGHT  . COLONOSCOPY WITH PROPOFOL N/A 02/16/2017   Procedure: COLONOSCOPY WITH PROPOFOL;  Surgeon: [name], MD;  Location: [facility] ENDOSCOPY;  Service: Endoscopy;  Laterality: N/A;  . CORONARY ARTERY BYPASS GRAFT N/A 02/21/2017   Procedure: CORONARY ARTERY BYPASS GRAFTING (CABG) times three. LIMA to LAD, SVG to OM and SVG to PDA;  Surgeon: Melrose Nakayama, MD;  Location: [place];  Service: Open Heart Surgery;  Laterality: N/A;  . CORONARY STENT PLACEMENT Right 1980  . ENTEROSCOPY N/A 06/14/2017   Procedure: ENTEROSCOPY;  Surgeon: [name], MD;  Location: [facility];  Service: Endoscopy;  Laterality: N/A;  . ESOPHAGOGASTRODUODENOSCOPY (EGD) WITH PROPOFOL N/A 02/16/2017   Procedure: ESOPHAGOGASTRODUODENOSCOPY (EGD) WITH PROPOFOL;  Surgeon: [name], MD;  Location[facility] ENDOSCOPY;  Service: Endoscopy;  Laterality: N/A;  . GIVENS CAPSULE STUDY N/A 05/08/2017   Procedure: GIVENS CAPSULE STUDY;  Surgeon: [name], MD;  Location: [place];  Service: Endoscopy;  Laterality: N/A;  . KNEE ARTHROSCOPY W/ MENISCAL REPAIR  LEFT KNEE   FORMED DVT (SURG TO REMOVE BLOOD CLOT)  . LEFT HEART CATH AND CORONARY ANGIOGRAPHY N/A 02/19/2017   Procedure: LEFT HEART CATH AND CORONARY ANGIOGRAPHY;  Surgeon: Nelva Bush, MD;  Location: [place] [facility];  Service: Cardiovascular;  Laterality: N/A;  . TEE WITHOUT CARDIOVERSION N/A 02/21/2017   Procedure: TRANSESOPHAGEAL ECHOCARDIOGRAM (TEE);  Surgeon: Melrose Nakayama, MD;  Location: [place];  Service: Open Heart Surgery;  Laterality: N/A;  . TONSILLECTOMY      Current Outpatient Medications  Medication Sig  Dispense Refill  . allopurinol (ZYLOPRIM) 300 MG tablet Take 300 mg by mouth daily.    Marland Kitchen aspirin EC 81 MG EC tablet Take 1 tablet (81 mg total) by mouth daily.    . calcium carbonate (TUMS - DOSED IN MG ELEMENTAL CALCIUM) 500 MG chewable tablet Chew 2 tablets by mouth 3 (three) times daily with meals.     . Cholecalciferol (VITAMIN D) 2000 units CAPS Take 2,000 Units by mouth daily.    . DULoxetine (CYMBALTA) 30 MG capsule Take 30 mg by mouth daily.    Marland Kitchen FeFum-FePoly-FA-B Cmp-C-Biot (INTEGRA PLUS) CAPS Take 1 capsule daily with a meal 30 capsule 3  . gabapentin (NEURONTIN) 600 MG tablet Take 300-600 mg by mouth See admin instructions. Take 300 mg twice daily IN THE MORNING AND AT NOON AND  600 mg at bedtime.    Marland Kitchen lisinopril (PRINIVIL,ZESTRIL) 40 MG tablet Take 1 tablet (40 mg total) by mouth daily. 90 tablet 3  . metoprolol tartrate (LOPRESSOR) 25 MG tablet Take 0.5 tablets (12.5 mg total) by mouth 2 (two) times daily. 90 tablet 3  . Omega-3 Fatty Acids (FISH OIL) 1360 MG CAPS Take by mouth.    . Oxycodone HCl 10 MG TABS Take 10 mg by mouth 3 (three) times daily as needed. For pain    . ranitidine (ZANTAC) 150 MG tablet Take 1 tablet (150 mg total) by mouth 2 (two) times daily. 60 tablet 6  . rivaroxaban (XARELTO) 20 MG TABS tablet Take 20 mg by mouth daily with supper.     No current facility-administered medications for this visit.     Allergies as of 01/28/2018 - Review Complete 01/28/2018  Allergen Reaction Noted  . Bee venom Anaphylaxis 06/20/2016    Family History  Problem Relation Age of Onset  . Heart attack Mother   . AAA (abdominal aortic aneurysm) Mother   . Heart attack Father   . Heart disease Father   . Heart attack Brother        CABG  . Heart disease Brother   . Heart attack Brother        CABG  . Heart attack Brother        CABG  . Ovarian cancer Maternal Grandmother   . Heart disease Maternal Grandfather   . Dementia Paternal Grandmother     Social History    Socioeconomic History  . Marital status: Divorced    Spouse name: Not on file  . Number of children: 2  . Years of education: 41  . Highest education level: Not on file  Occupational History  . Occupation: Retired  Scientific laboratory technician  . Financial resource strain: Not on file  . Food insecurity:    Worry: Not on file    Inability: Not on file  . Transportation needs:    Medical: Not on file    Non-medical: Not on file  Tobacco Use  . Smoking status: Current Every Day Smoker  Packs/day: 0.25    Years: 50.00    Pack years: 12.50    Types: Cigarettes    Start date: 06/18/1967  . Smokeless tobacco: Never Used  . Tobacco comment: 3-4 a day  Substance and Sexual Activity  . Alcohol use: No    Alcohol/week: 0.0 standard drinks  . Drug use: No  . Sexual activity: Not Currently  Lifestyle  . Physical activity:    Days per week: Not on file    Minutes per session: Not on file  . Stress: Not on file  Relationships  . Social connections:    Talks on phone: Not on file    Gets together: Not on file    Attends religious service: Not on file    Active member of club or organization: Not on file    Attends meetings of clubs or organizations: Not on file    Relationship status: Not on file  . Intimate partner violence:    Fear of current or ex partner: Not on file    Emotionally abused: Not on file    Physically abused: Not on file    Forced sexual activity: Not on file  Other Topics Concern  . Not on file  Social History Narrative   Lives at home alone.   Right-handed.   2 cups caffeine per day.     Physical Exam: BP (!) 174/70 (BP Location: Left Arm, Patient Position: Sitting, Cuff Size: Normal)   Pulse 80   Ht 5\' 3"  (1.6 m) Comment: height measured without shoes  Wt 225 lb 8 oz (102.3 kg)   BMI 39.95 kg/m  Constitutional: generally well-appearing Psychiatric: alert and oriented x3 Abdomen: soft, nontender, nondistended, no obvious ascites, no peritoneal signs, normal  bowel sounds No peripheral edema noted in lower extremities  Assessment and plan: 67 y.o. female with iron deficiency anemia, resolved  Very likely her iron deficiency anemia was from small bowel AVMs.  2 of them were ablated several months ago.  She may have others.  While she continues to need blood thinner indefinitely I think she should probably also stay on iron once daily.  She will have a repeat set of labs today to check her blood counts and I will advise her what to do with her iron after that.  If all looks good, I would probably like a repeat set of labs in about 6 months.  Please see the "Patient Instructions" section for addition details about the plan.  Owens Loffler, MD [facility] 01/28/2018, 10:52 AM

## 2018-01-28 NOTE — Patient Instructions (Addendum)
You will have labs checked today in the basement lab.  Please head down after you check out with the front desk  (cbc). Continue once daily iron supplement for now.  Thank you for entrusting me with your care and choosing [facility].  Dr Ardis Hughs

## 2018-02-22 DIAGNOSIS — Z86718 Personal history of other venous thrombosis and embolism: Secondary | ICD-10-CM | POA: Diagnosis not present

## 2018-02-22 DIAGNOSIS — Z1389 Encounter for screening for other disorder: Secondary | ICD-10-CM | POA: Diagnosis not present

## 2018-02-22 DIAGNOSIS — G894 Chronic pain syndrome: Secondary | ICD-10-CM | POA: Diagnosis not present

## 2018-02-22 DIAGNOSIS — E782 Mixed hyperlipidemia: Secondary | ICD-10-CM | POA: Diagnosis not present

## 2018-02-22 DIAGNOSIS — I509 Heart failure, unspecified: Secondary | ICD-10-CM | POA: Diagnosis not present

## 2018-02-22 DIAGNOSIS — I10 Essential (primary) hypertension: Secondary | ICD-10-CM | POA: Diagnosis not present

## 2018-02-22 DIAGNOSIS — M545 Low back pain: Secondary | ICD-10-CM | POA: Diagnosis not present

## 2018-02-22 DIAGNOSIS — Z0001 Encounter for general adult medical examination with abnormal findings: Secondary | ICD-10-CM | POA: Diagnosis not present

## 2018-03-20 ENCOUNTER — Encounter: Payer: Self-pay | Admitting: Cardiovascular Disease

## 2018-03-20 ENCOUNTER — Ambulatory Visit: Payer: Medicare Other | Admitting: Cardiovascular Disease

## 2018-03-20 ENCOUNTER — Other Ambulatory Visit (HOSPITAL_COMMUNITY)
Admission: RE | Admit: 2018-03-20 | Discharge: 2018-03-20 | Disposition: A | Discharge status: Home / Self Care | Payer: Medicare Other | Source: Ambulatory Visit | Attending: Cardiovascular Disease | Admitting: Cardiovascular Disease

## 2018-03-20 VITALS — BP 156/86 | HR 85 | Ht 63.0 in | Wt 228.0 lb

## 2018-03-20 DIAGNOSIS — I10 Essential (primary) hypertension: Secondary | ICD-10-CM | POA: Diagnosis not present

## 2018-03-20 DIAGNOSIS — E785 Hyperlipidemia, unspecified: Secondary | ICD-10-CM | POA: Diagnosis not present

## 2018-03-20 DIAGNOSIS — I6523 Occlusion and stenosis of bilateral carotid arteries: Secondary | ICD-10-CM

## 2018-03-20 DIAGNOSIS — G459 Transient cerebral ischemic attack, unspecified: Secondary | ICD-10-CM

## 2018-03-20 DIAGNOSIS — Z720 Tobacco use: Secondary | ICD-10-CM

## 2018-03-20 DIAGNOSIS — I25708 Atherosclerosis of coronary artery bypass graft(s), unspecified, with other forms of angina pectoris: Secondary | ICD-10-CM | POA: Diagnosis not present

## 2018-03-20 DIAGNOSIS — I82592 Chronic embolism and thrombosis of other specified deep vein of left lower extremity: Secondary | ICD-10-CM

## 2018-03-20 LAB — BASIC METABOLIC PANEL
Anion gap: 9 (ref 5–15)
BUN: 10 mg/dL (ref 8–23)
CO2: 28 mmol/L (ref 22–32)
Calcium: 9.1 mg/dL (ref 8.9–10.3)
Chloride: 103 mmol/L (ref 98–111)
Creatinine, Ser: 0.96 mg/dL (ref 0.44–1.00)
GFR calc Af Amer: >60 mL/min (ref >60)
GFR calc non Af Amer: >60 mL/min (ref >60)
Glucose, Bld: 132 mg/dL — ABNORMAL HIGH (ref 70–99)
Potassium: 4 mmol/L (ref 3.5–5.1)
Sodium: 140 mmol/L (ref 135–145)

## 2018-03-20 MED ORDER — CARVEDILOL 3.125 MG PO TABS: 3.1250 mg | ORAL_TABLET | Freq: Two times a day (BID) | ORAL | 3 refills | Status: DC

## 2018-03-20 NOTE — Addendum Note (Signed)
Addended by: Debbora Lacrosse R on: 03/20/2018 01:24 PM   Modules accepted: Orders

## 2018-03-20 NOTE — Patient Instructions (Addendum)
  Medication Instructions:  Stop lopressor    Start coreg 3.125 two times daily   Labwork: none  Testing/Procedures: Mri - brain   Follow-Up: Your physician wants you to follow-up in: 6 months.  You will receive a reminder letter in the mail two months in advance. If you don't receive a letter, please call our office to schedule the follow-up appointment.  Any Other Special Instructions Will Be Listed Below (If Applicable).     If you need a refill on your cardiac medications before your next appointment, please call your pharmacy.

## 2018-03-20 NOTE — Progress Notes (Signed)
SUBJECTIVE: The patient presents for routine follow-up for coronary artery disease with history of CABG.  She saw B. Strader PA-C on 01/24/2018.  She was hypertensive.  Ultimately, lisinopril was increased to 40 mg daily.  ECG performed in the office today which I ordered and personally interpreted demonstrates normal sinus rhythm with old anterior infarct and nonspecific T wave abnormalities.  Carotid Dopplers on 12/10/2017 showed 40 to 59% bilateral internal carotid artery stenosis.  On December 23, she was at her neighbor's house whom she is living with.  She was unable to speak for roughly 3 minutes.  Since that time she has had episodes where "I feel like I am drunk and everything is slow ".  She has episodic dizziness but denies syncope.  She denies palpitations.  She lies down afterwards.  She has had some blurriness of vision since that time.  Her blood pressures remain elevated, 156/86 today.  She denies chest pain and shortness of breath.   Review of Systems: As per "subjective", otherwise negative.  Allergies  Allergen Reactions  . Bee Venom Anaphylaxis    Current Outpatient Medications  Medication Sig Dispense Refill  . allopurinol (ZYLOPRIM) 300 MG tablet Take 300 mg by mouth daily.    Marland Kitchen aspirin EC 81 MG EC tablet Take 1 tablet (81 mg total) by mouth daily.    . calcium carbonate (TUMS - DOSED IN MG ELEMENTAL CALCIUM) 500 MG chewable tablet Chew 2 tablets by mouth 3 (three) times daily with meals.     . Cholecalciferol (VITAMIN D) 2000 units CAPS Take 2,000 Units by mouth daily.    . DULoxetine (CYMBALTA) 30 MG capsule Take 30 mg by mouth daily.    Marland Kitchen FeFum-FePoly-FA-B Cmp-C-Biot (INTEGRA PLUS) CAPS Take 1 capsule daily with a meal 30 capsule 3  . gabapentin (NEURONTIN) 600 MG tablet Take 300-600 mg by mouth See admin instructions. Take 300 mg twice daily IN THE MORNING AND AT NOON AND  600 mg at bedtime.    Marland Kitchen lisinopril (PRINIVIL,ZESTRIL) 40 MG tablet Take 1 tablet  (40 mg total) by mouth daily. 90 tablet 3  . metoprolol tartrate (LOPRESSOR) 25 MG tablet Take 0.5 tablets (12.5 mg total) by mouth 2 (two) times daily. 90 tablet 3  . Omega-3 Fatty Acids (FISH OIL) 1360 MG CAPS Take by mouth.    . Oxycodone HCl 10 MG TABS Take 10 mg by mouth 3 (three) times daily as needed. For pain    . ranitidine (ZANTAC) 150 MG tablet Take 1 tablet (150 mg total) by mouth 2 (two) times daily. 60 tablet 6  . rivaroxaban (XARELTO) 20 MG TABS tablet Take 20 mg by mouth daily with supper.     No current facility-administered medications for this visit.     Past Medical History:  Diagnosis Date  . Anemia   . Anxiety   . Arthritis    pt. denies  . Bell's palsy   . CAD (coronary artery disease)    a. Cath 12/2014 - mild-moderate non-obstructive CAD with 60% mid RCA, 50% mid Cx-distal Cx, 30% distal LAD, 25% D2, EF 55-65%. b. 02/2017: NSTEMI with cath showing 60% LM stenosis and 100% RCA stenosis --> CT Surgery consulted with CABG on 02/21/2017 (LIMA-LAD, SVG-OM, and SVG-PDA)  . Depression   . Dizziness   . DVT (deep venous thrombosis) (HCC)    on Xarelto  . Dyspnea    W/ EXERTION pt. denies  . GERD (gastroesophageal reflux disease)   .  Heart murmur    DX   AT BIRTH  . Hyperlipidemia   . Hypertension   . 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 Myocardial infarction ([place])   . Neuropathy   . PE (pulmonary embolism)   . Tobacco abuse     Past Surgical History:  Procedure Laterality Date  . ABDOMINAL HYSTERECTOMY  1974  . CARDIAC CATHETERIZATION N/A 01/08/2015   Procedure: Left Heart Cath and Coronary Angiography;  Surgeon: Jolaine Artist, MD;  Location: [place] [facility];  Service: Cardiovascular;  Laterality: N/A;  . CAROTID ARTERY ANGIOPLASTY     stent right side  . CARPAL TUNNEL RELEASE     RIGHT  . COLONOSCOPY WITH PROPOFOL N/A 02/16/2017   Procedure: COLONOSCOPY WITH PROPOFOL;  Surgeon: [name], MD;  Location: [facility] ENDOSCOPY;  Service:  Endoscopy;  Laterality: N/A;  . CORONARY ARTERY BYPASS GRAFT N/A 02/21/2017   Procedure: CORONARY ARTERY BYPASS GRAFTING (CABG) times three. LIMA to LAD, SVG to OM and SVG to PDA;  Surgeon: Melrose Nakayama, MD;  Location: [place];  Service: Open Heart Surgery;  Laterality: N/A;  . CORONARY STENT PLACEMENT Right 1980  . ENTEROSCOPY N/A 06/14/2017   Procedure: ENTEROSCOPY;  Surgeon: [name], MD;  Location: [facility];  Service: Endoscopy;  Laterality: N/A;  . ESOPHAGOGASTRODUODENOSCOPY (EGD) WITH PROPOFOL N/A 02/16/2017   Procedure: ESOPHAGOGASTRODUODENOSCOPY (EGD) WITH PROPOFOL;  Surgeon: [name], MD;  Location: [facility] ENDOSCOPY;  Service: Endoscopy;  Laterality: N/A;  . GIVENS CAPSULE STUDY N/A 05/08/2017   Procedure: GIVENS CAPSULE STUDY;  Surgeon: [name], MD;  Location: [place];  Service: Endoscopy;  Laterality: N/A;  . KNEE ARTHROSCOPY W/ MENISCAL REPAIR     LEFT KNEE   FORMED DVT (SURG TO REMOVE BLOOD CLOT)  . LEFT HEART CATH AND CORONARY ANGIOGRAPHY N/A 02/19/2017   Procedure: LEFT HEART CATH AND CORONARY ANGIOGRAPHY;  Surgeon: Nelva Bush, MD;  Location: [place] [facility];  Service: Cardiovascular;  Laterality: N/A;  . TEE WITHOUT CARDIOVERSION N/A 02/21/2017   Procedure: TRANSESOPHAGEAL ECHOCARDIOGRAM (TEE);  Surgeon: Melrose Nakayama, MD;  Location: [place];  Service: Open Heart Surgery;  Laterality: N/A;  . TONSILLECTOMY      Social History   Socioeconomic History  . Marital status: Divorced    Spouse name: Not on file  . Number of children: 2  . Years of education: 69  . Highest education level: Not on file  Occupational History  . Occupation: Retired  Scientific laboratory technician  . Financial resource strain: Not on file  . Food insecurity:    Worry: Not on file    Inability: Not on file  . Transportation needs:    Medical: Not on file    Non-medical: Not on file  Tobacco Use  . Smoking status: Current Every Day Smoker    Packs/day: 0.25     Years: 50.00    Pack years: 12.50    Types: Cigarettes    Start date: 06/18/1967  . Smokeless tobacco: Never Used  . Tobacco comment: 3-4 a day  Substance and Sexual Activity  . Alcohol use: No    Alcohol/week: 0.0 standard drinks  . Drug use: No  . Sexual activity: Not Currently  Lifestyle  . Physical activity:    Days per week: Not on file    Minutes per session: Not on file  . Stress: Not on file  Relationships  . Social connections:    Talks  on phone: Not on file    Gets together: Not on file    Attends religious service: Not on file    Active member of club or organization: Not on file    Attends meetings of clubs or organizations: Not on file    Relationship status: Not on file  . Intimate partner violence:    Fear of current or ex partner: Not on file    Emotionally abused: Not on file    Physically abused: Not on file    Forced sexual activity: Not on file  Other Topics Concern  . Not on file  Social History Narrative   Lives at home alone.   Right-handed.   2 cups caffeine per day.     Vitals:   03/20/18 1257  BP: (!) 156/86  Pulse: 85  SpO2: 93%  Weight: 228 lb (103.4 kg)  Height: 5\' 3"  (1.6 m)    Wt Readings from Last 3 Encounters:  03/20/18 228 lb (103.4 kg)  01/28/18 225 lb 8 oz (102.3 kg)  01/24/18 223 lb 12.8 oz (101.5 kg)     PHYSICAL EXAM General: NAD HEENT: Normal. Neck: No JVD, no thyromegaly. Lungs: Clear to auscultation bilaterally with normal respiratory effort. CV: Regular rate and rhythm, normal S1/S2, no S3/S4, no murmur. No pretibial or periankle edema.  No carotid bruit.   Abdomen: Soft, nontender, no distention.  Neurologic: Alert and oriented.  Psych: Normal affect. Skin: Normal. Musculoskeletal: No gross deformities.    ECG: Reviewed above under Subjective   Labs: Lab Results  Component Value Date/Time   K 3.5 04/03/2017 12:00 PM   BUN 9 04/03/2017 12:00 PM   CREATININE 0.88 04/03/2017 12:00 PM   ALT 16  02/13/2017 01:35 PM   TSH 1.480 06/20/2016 02:43 PM   HGB 15.0 01/28/2018 11:03 AM     Lipids: Lab Results  Component Value Date/Time   LDLCALC 67 02/14/2017 08:41 AM   CHOL 129 02/14/2017 08:41 AM   TRIG 162 (H) 02/14/2017 08:41 AM   HDL 30 (L) 02/14/2017 08:41 AM       ASSESSMENT AND PLAN:  1.  Coronary artery disease: Symptomatically stable.  History of CABG in December 2018 with LIMA to the LAD, SVG to the OM, and SVG to the PDA.  Continue aspirin.  Given elevated blood pressure, I will switch Lopressor to carvedilol 3.125 mg twice daily.  Continue atorvastatin.  2.  History of DVT: She remains on Xarelto 20 mg daily.  3.  Hypertension: Blood pressure remains elevated.  I will switch Lopressor to carvedilol 3.125 mg twice daily.  Continue lisinopril 40 mg.  4.  Hyperlipidemia: Continue atorvastatin 40 mg.  Goal LDL less than 70.  5.  Tobacco abuse: Previously smoking 1.5 packs of cigarettes daily but has reduced this to 3 to 4 cigarettes daily with the assistance of Chantix.  6.  Bilateral carotid artery stenosis: 40 to 59% on 12/10/2017.  Doppler should be repeated 1 year from prior exam.  Currently on aspirin and statin.  7.  Possible TIA: MRI of the head was normal on 07/10/2016.  She has residual deficits with visual disturbances and episodic dizziness.  I will obtain an MRI.  I have encouraged her to obtain a follow-up with her neurologist ASAP.  Continue aspirin and statin.   Disposition: Follow up 6 months   Kate Sable, M.D., F.A.C.C.

## 2018-03-22 ENCOUNTER — Other Ambulatory Visit: Payer: Self-pay | Admitting: Student

## 2018-03-25 ENCOUNTER — Ambulatory Visit (HOSPITAL_COMMUNITY)
Admission: RE | Admit: 2018-03-25 | Discharge: 2018-03-25 | Disposition: A | Discharge status: Home / Self Care | Payer: Medicare Other | Source: Ambulatory Visit | Attending: Cardiovascular Disease | Admitting: Cardiovascular Disease

## 2018-03-25 DIAGNOSIS — R531 Weakness: Secondary | ICD-10-CM | POA: Diagnosis not present

## 2018-03-25 DIAGNOSIS — G459 Transient cerebral ischemic attack, unspecified: Secondary | ICD-10-CM | POA: Diagnosis not present

## 2018-03-25 DIAGNOSIS — R42 Dizziness and giddiness: Secondary | ICD-10-CM | POA: Diagnosis not present

## 2018-03-25 MED ORDER — GADOBUTROL 1 MMOL/ML IV SOLN
10.0000 mL | Freq: Once | INTRAVENOUS | Status: AC | PRN
  Administered 2018-03-25: 10 mL via INTRAVENOUS

## 2018-04-05 DIAGNOSIS — G459 Transient cerebral ischemic attack, unspecified: Secondary | ICD-10-CM | POA: Diagnosis not present

## 2018-04-05 DIAGNOSIS — G894 Chronic pain syndrome: Secondary | ICD-10-CM | POA: Diagnosis not present

## 2018-05-02 DIAGNOSIS — G894 Chronic pain syndrome: Secondary | ICD-10-CM | POA: Diagnosis not present

## 2018-05-06 ENCOUNTER — Ambulatory Visit: Payer: Medicare Other | Admitting: Neurology

## 2018-05-17 ENCOUNTER — Other Ambulatory Visit: Payer: Self-pay | Admitting: Physician Assistant

## 2018-05-17 NOTE — Telephone Encounter (Signed)
Dr Ardis Hughs- Do you want your patient's that are taking ranitidine to be switched to famotidine or are you fine with them continuing their ranitidine? Im told there is a new lot of ranitidine out now that has not been recalled, however I was not sure of your preference.Marland KitchenMarland KitchenMarland Kitchen

## 2018-06-13 ENCOUNTER — Ambulatory Visit: Payer: Medicare Other | Admitting: Family

## 2018-06-13 ENCOUNTER — Encounter (HOSPITAL_COMMUNITY): Payer: Medicare Other

## 2018-06-27 DIAGNOSIS — I10 Essential (primary) hypertension: Secondary | ICD-10-CM | POA: Diagnosis not present

## 2018-06-27 DIAGNOSIS — L409 Psoriasis, unspecified: Secondary | ICD-10-CM | POA: Diagnosis not present

## 2018-06-27 DIAGNOSIS — Z1389 Encounter for screening for other disorder: Secondary | ICD-10-CM | POA: Diagnosis not present

## 2018-06-27 DIAGNOSIS — G894 Chronic pain syndrome: Secondary | ICD-10-CM | POA: Diagnosis not present

## 2018-06-27 DIAGNOSIS — I509 Heart failure, unspecified: Secondary | ICD-10-CM | POA: Diagnosis not present

## 2018-07-03 DIAGNOSIS — M5441 Lumbago with sciatica, right side: Secondary | ICD-10-CM | POA: Diagnosis not present

## 2018-07-03 DIAGNOSIS — M9903 Segmental and somatic dysfunction of lumbar region: Secondary | ICD-10-CM | POA: Diagnosis not present

## 2018-07-03 DIAGNOSIS — S233XXA Sprain of ligaments of thoracic spine, initial encounter: Secondary | ICD-10-CM | POA: Diagnosis not present

## 2018-07-03 DIAGNOSIS — M47816 Spondylosis without myelopathy or radiculopathy, lumbar region: Secondary | ICD-10-CM | POA: Diagnosis not present

## 2018-07-05 DIAGNOSIS — M5441 Lumbago with sciatica, right side: Secondary | ICD-10-CM | POA: Diagnosis not present

## 2018-07-05 DIAGNOSIS — M47816 Spondylosis without myelopathy or radiculopathy, lumbar region: Secondary | ICD-10-CM | POA: Diagnosis not present

## 2018-07-05 DIAGNOSIS — M9903 Segmental and somatic dysfunction of lumbar region: Secondary | ICD-10-CM | POA: Diagnosis not present

## 2018-07-05 DIAGNOSIS — S233XXA Sprain of ligaments of thoracic spine, initial encounter: Secondary | ICD-10-CM | POA: Diagnosis not present

## 2018-07-08 DIAGNOSIS — M9903 Segmental and somatic dysfunction of lumbar region: Secondary | ICD-10-CM | POA: Diagnosis not present

## 2018-07-08 DIAGNOSIS — M47816 Spondylosis without myelopathy or radiculopathy, lumbar region: Secondary | ICD-10-CM | POA: Diagnosis not present

## 2018-07-08 DIAGNOSIS — S233XXA Sprain of ligaments of thoracic spine, initial encounter: Secondary | ICD-10-CM | POA: Diagnosis not present

## 2018-07-08 DIAGNOSIS — M5441 Lumbago with sciatica, right side: Secondary | ICD-10-CM | POA: Diagnosis not present

## 2018-07-10 ENCOUNTER — Other Ambulatory Visit: Payer: Self-pay | Admitting: Gastroenterology

## 2018-07-11 DIAGNOSIS — M47816 Spondylosis without myelopathy or radiculopathy, lumbar region: Secondary | ICD-10-CM | POA: Diagnosis not present

## 2018-07-11 DIAGNOSIS — M9903 Segmental and somatic dysfunction of lumbar region: Secondary | ICD-10-CM | POA: Diagnosis not present

## 2018-07-11 DIAGNOSIS — S233XXA Sprain of ligaments of thoracic spine, initial encounter: Secondary | ICD-10-CM | POA: Diagnosis not present

## 2018-07-11 DIAGNOSIS — M5441 Lumbago with sciatica, right side: Secondary | ICD-10-CM | POA: Diagnosis not present

## 2018-07-15 DIAGNOSIS — M9903 Segmental and somatic dysfunction of lumbar region: Secondary | ICD-10-CM | POA: Diagnosis not present

## 2018-07-15 DIAGNOSIS — M47816 Spondylosis without myelopathy or radiculopathy, lumbar region: Secondary | ICD-10-CM | POA: Diagnosis not present

## 2018-07-15 DIAGNOSIS — S233XXA Sprain of ligaments of thoracic spine, initial encounter: Secondary | ICD-10-CM | POA: Diagnosis not present

## 2018-07-15 DIAGNOSIS — M5441 Lumbago with sciatica, right side: Secondary | ICD-10-CM | POA: Diagnosis not present

## 2018-07-18 DIAGNOSIS — S233XXA Sprain of ligaments of thoracic spine, initial encounter: Secondary | ICD-10-CM | POA: Diagnosis not present

## 2018-07-18 DIAGNOSIS — M9903 Segmental and somatic dysfunction of lumbar region: Secondary | ICD-10-CM | POA: Diagnosis not present

## 2018-07-18 DIAGNOSIS — M5441 Lumbago with sciatica, right side: Secondary | ICD-10-CM | POA: Diagnosis not present

## 2018-07-18 DIAGNOSIS — M47816 Spondylosis without myelopathy or radiculopathy, lumbar region: Secondary | ICD-10-CM | POA: Diagnosis not present

## 2018-07-22 DIAGNOSIS — S233XXA Sprain of ligaments of thoracic spine, initial encounter: Secondary | ICD-10-CM | POA: Diagnosis not present

## 2018-07-22 DIAGNOSIS — M9903 Segmental and somatic dysfunction of lumbar region: Secondary | ICD-10-CM | POA: Diagnosis not present

## 2018-07-22 DIAGNOSIS — M5441 Lumbago with sciatica, right side: Secondary | ICD-10-CM | POA: Diagnosis not present

## 2018-07-22 DIAGNOSIS — M47816 Spondylosis without myelopathy or radiculopathy, lumbar region: Secondary | ICD-10-CM | POA: Diagnosis not present

## 2018-07-23 ENCOUNTER — Other Ambulatory Visit: Payer: Self-pay | Admitting: Gastroenterology

## 2018-07-24 DIAGNOSIS — M9903 Segmental and somatic dysfunction of lumbar region: Secondary | ICD-10-CM | POA: Diagnosis not present

## 2018-07-24 DIAGNOSIS — M5441 Lumbago with sciatica, right side: Secondary | ICD-10-CM | POA: Diagnosis not present

## 2018-07-24 DIAGNOSIS — S233XXA Sprain of ligaments of thoracic spine, initial encounter: Secondary | ICD-10-CM | POA: Diagnosis not present

## 2018-07-24 DIAGNOSIS — M47816 Spondylosis without myelopathy or radiculopathy, lumbar region: Secondary | ICD-10-CM | POA: Diagnosis not present

## 2018-07-25 DIAGNOSIS — I10 Essential (primary) hypertension: Secondary | ICD-10-CM | POA: Diagnosis not present

## 2018-07-25 DIAGNOSIS — I700 Atherosclerosis of aorta: Secondary | ICD-10-CM | POA: Diagnosis not present

## 2018-07-25 DIAGNOSIS — I214 Non-ST elevation (NSTEMI) myocardial infarction: Secondary | ICD-10-CM | POA: Diagnosis not present

## 2018-07-25 DIAGNOSIS — G894 Chronic pain syndrome: Secondary | ICD-10-CM | POA: Diagnosis not present

## 2018-07-26 ENCOUNTER — Telehealth: Payer: Self-pay | Admitting: Gastroenterology

## 2018-07-26 DIAGNOSIS — M5441 Lumbago with sciatica, right side: Secondary | ICD-10-CM | POA: Diagnosis not present

## 2018-07-26 DIAGNOSIS — S233XXA Sprain of ligaments of thoracic spine, initial encounter: Secondary | ICD-10-CM | POA: Diagnosis not present

## 2018-07-26 DIAGNOSIS — M9903 Segmental and somatic dysfunction of lumbar region: Secondary | ICD-10-CM | POA: Diagnosis not present

## 2018-07-26 DIAGNOSIS — M47816 Spondylosis without myelopathy or radiculopathy, lumbar region: Secondary | ICD-10-CM | POA: Diagnosis not present

## 2018-07-26 NOTE — Telephone Encounter (Signed)
Pt called stating that she was returning [facility] call regarding medication rf but pt was not sure about what medication.

## 2018-07-26 NOTE — Telephone Encounter (Signed)
Informed patient Jennifer [name] is not here today but it looks like the only medication Dr. Ardis Hughs prescribes for her is Zantac. I am assuming its to inform patient she needs to d/c that medication since it has been pulled from the market and start pepcid in its place. Patient states she was wondering why her pharmacy continues to fill the medication when she was aware of the warning with that medication. Patient states she will purchase OTC pepcid 20 mg and take that in the place of zantac.

## 2018-07-29 DIAGNOSIS — M9903 Segmental and somatic dysfunction of lumbar region: Secondary | ICD-10-CM | POA: Diagnosis not present

## 2018-07-29 DIAGNOSIS — S233XXA Sprain of ligaments of thoracic spine, initial encounter: Secondary | ICD-10-CM | POA: Diagnosis not present

## 2018-07-29 DIAGNOSIS — M5441 Lumbago with sciatica, right side: Secondary | ICD-10-CM | POA: Diagnosis not present

## 2018-07-29 DIAGNOSIS — M47816 Spondylosis without myelopathy or radiculopathy, lumbar region: Secondary | ICD-10-CM | POA: Diagnosis not present

## 2018-08-01 DIAGNOSIS — M9903 Segmental and somatic dysfunction of lumbar region: Secondary | ICD-10-CM | POA: Diagnosis not present

## 2018-08-01 DIAGNOSIS — M47816 Spondylosis without myelopathy or radiculopathy, lumbar region: Secondary | ICD-10-CM | POA: Diagnosis not present

## 2018-08-01 DIAGNOSIS — M5441 Lumbago with sciatica, right side: Secondary | ICD-10-CM | POA: Diagnosis not present

## 2018-08-01 DIAGNOSIS — S233XXA Sprain of ligaments of thoracic spine, initial encounter: Secondary | ICD-10-CM | POA: Diagnosis not present

## 2018-08-19 DIAGNOSIS — H5203 Hypermetropia, bilateral: Secondary | ICD-10-CM | POA: Diagnosis not present

## 2018-09-11 ENCOUNTER — Other Ambulatory Visit (HOSPITAL_COMMUNITY): Payer: Self-pay | Admitting: Family Medicine

## 2018-09-11 DIAGNOSIS — Z1231 Encounter for screening mammogram for malignant neoplasm of breast: Secondary | ICD-10-CM

## 2018-09-16 ENCOUNTER — Other Ambulatory Visit: Payer: Self-pay

## 2018-09-16 DIAGNOSIS — I6523 Occlusion and stenosis of bilateral carotid arteries: Secondary | ICD-10-CM

## 2018-09-17 ENCOUNTER — Telehealth (HOSPITAL_COMMUNITY): Payer: Self-pay | Admitting: *Deleted

## 2018-09-17 NOTE — Telephone Encounter (Signed)
The above patient or their representative was contacted and gave the following answers to these questions:         Do you have any of the following symptoms?n  Fever                    Cough                   Shortness of breath  Do  you have any of the following other symptoms? n   muscle pain         vomiting,        diarrhea        rash         weakness        red eye        abdominal pain         bruising          bruising or bleeding              joint pain           severe headache    Have you been in contact with someone who was or has been sick in the past 2 weeks?n  Yes                 Unsure                         Unable to assess   Does the person that you were in contact with have any of the following symptoms? n  Cough         shortness of breath           muscle pain         vomiting,            diarrhea            rash            weakness           fever            red eye           abdominal pain           bruising  or  bleeding                joint pain                severe headache               Have you  or someone you have been in contact with traveled internationally in th last month?   n      If yes, which countries?   Have you  or someone you have been in contact with traveled outside [place] in th last month?      n   If yes, which state and city?   COMMENTS OR ACTION PLAN FOR THIS PATIENT:          

## 2018-09-18 ENCOUNTER — Other Ambulatory Visit: Payer: Self-pay

## 2018-09-18 ENCOUNTER — Ambulatory Visit (HOSPITAL_COMMUNITY)
Admission: RE | Admit: 2018-09-18 | Discharge: 2018-09-18 | Disposition: A | Discharge status: Home / Self Care | Payer: Medicare Other | Source: Ambulatory Visit | Attending: Family | Admitting: Family

## 2018-09-18 ENCOUNTER — Encounter: Payer: Self-pay | Admitting: Family

## 2018-09-18 ENCOUNTER — Ambulatory Visit (INDEPENDENT_AMBULATORY_CARE_PROVIDER_SITE_OTHER): Payer: Medicare Other | Admitting: Family

## 2018-09-18 VITALS — BP 147/79 | HR 77 | Temp 97.3°F | Resp 20 | Ht 63.0 in | Wt 223.0 lb

## 2018-09-18 DIAGNOSIS — I6523 Occlusion and stenosis of bilateral carotid arteries: Secondary | ICD-10-CM | POA: Insufficient documentation

## 2018-09-18 DIAGNOSIS — Z7901 Long term (current) use of anticoagulants: Secondary | ICD-10-CM

## 2018-09-18 DIAGNOSIS — Z8673 Personal history of transient ischemic attack (TIA), and cerebral infarction without residual deficits: Secondary | ICD-10-CM | POA: Diagnosis not present

## 2018-09-18 DIAGNOSIS — F17200 Nicotine dependence, unspecified, uncomplicated: Secondary | ICD-10-CM

## 2018-09-18 NOTE — Patient Instructions (Signed)
Steps to Quit Smoking Smoking tobacco is the leading cause of preventable death. It can affect almost every organ in the body. Smoking puts you and people around you at risk for many serious, long-lasting (chronic) diseases. Quitting smoking can be hard, but it is one of the best things that you can do for your health. It is never too late to quit. How do I get ready to quit? When you decide to quit smoking, make a plan to help you succeed. Before you quit:  Pick a date to quit. Set a date within the next 2 weeks to give you time to prepare.  Write down the reasons why you are quitting. Keep this list in places where you will see it often.  Tell your family, friends, and co-workers that you are quitting. Their support is important.  Talk with your doctor about the choices that may help you quit.  Find out if your health insurance will pay for these treatments.  Know the people, places, things, and activities that make you want to smoke (triggers). Avoid them. What first steps can I take to quit smoking?  Throw away all cigarettes at home, at work, and in your car.  Throw away the things that you use when you smoke, such as ashtrays and lighters.  Clean your car. Make sure to empty the ashtray.  Clean your home, including curtains and carpets. What can I do to help me quit smoking? Talk with your doctor about taking medicines and seeing a counselor at the same time. You are more likely to succeed when you do both.  If you are pregnant or breastfeeding, talk with your doctor about counseling or other ways to quit smoking. Do not take medicine to help you quit smoking unless your doctor tells you to do so. To quit smoking: Quit right away  Quit smoking totally, instead of slowly cutting back on how much you smoke over a period of time.  Go to counseling. You are more likely to quit if you go to counseling sessions regularly. Take medicine You may take medicines to help you quit. Some  medicines need a prescription, and some you can buy over-the-counter. Some medicines may contain a drug called nicotine to replace the nicotine in cigarettes. Medicines may:  Help you to stop having the desire to smoke (cravings).  Help to stop the problems that come when you stop smoking (withdrawal symptoms). Your doctor may ask you to use:  Nicotine patches, gum, or lozenges.  Nicotine inhalers or sprays.  Non-nicotine medicine that is taken by mouth. Find resources Find resources and other ways to help you quit smoking and remain smoke-free after you quit. These resources are most helpful when you use them often. They include:  Online chats with a counselor.  Phone quitlines.  Printed self-help materials.  Support groups or group counseling.  Text messaging programs.  Mobile phone apps. Use apps on your mobile phone or tablet that can help you stick to your quit plan. There are many free apps for mobile phones and tablets as well as websites. Examples include Quit Guide from the CDC and smokefree.gov  What things can I do to make it easier to quit?   Talk to your family and friends. Ask them to support and encourage you.  Call a phone quitline (1-800-QUIT-NOW), reach out to support groups, or work with a counselor.  Ask people who smoke to not smoke around you.  Avoid places that make you want to smoke,   such as: ? Bars. ? Parties. ? Smoke-break areas at work.  Spend time with people who do not smoke.  Lower the stress in your life. Stress can make you want to smoke. Try these things to help your stress: ? Getting regular exercise. ? Doing deep-breathing exercises. ? Doing yoga. ? Meditating. ? Doing a body scan. To do this, close your eyes, focus on one area of your body at a time from head to toe. Notice which parts of your body are tense. Try to relax the muscles in those areas. How will I feel when I quit smoking? Day 1 to 3 weeks Within the first 24 hours,  you may start to have some problems that come from quitting tobacco. These problems are very bad 2-3 days after you quit, but they do not often last for more than 2-3 weeks. You may get these symptoms:  Mood swings.  Feeling restless, nervous, angry, or annoyed.  Trouble concentrating.  Dizziness.  Strong desire for high-sugar foods and nicotine.  Weight gain.  Trouble pooping (constipation).  Feeling like you may vomit (nausea).  Coughing or a sore throat.  Changes in how the medicines that you take for other issues work in your body.  Depression.  Trouble sleeping (insomnia). Week 3 and afterward After the first 2-3 weeks of quitting, you may start to notice more positive results, such as:  Better sense of smell and taste.  Less coughing and sore throat.  Slower heart rate.  Lower blood pressure.  Clearer skin.  Better breathing.  Fewer sick days. Quitting smoking can be hard. Do not give up if you fail the first time. Some people need to try a few times before they succeed. Do your best to stick to your quit plan, and talk with your doctor if you have any questions or concerns. Summary  Smoking tobacco is the leading cause of preventable death. Quitting smoking can be hard, but it is one of the best things that you can do for your health.  When you decide to quit smoking, make a plan to help you succeed.  Quit smoking right away, not slowly over a period of time.  When you start quitting, seek help from your doctor, family, or friends. This information is not intended to replace advice given to you by your health care provider. Make sure you discuss any questions you have with your health care provider. Document Released: 12/24/2008 Document Revised: 05/17/2018 Document Reviewed: 05/18/2018 Elsevier Patient Education  2020 Elsevier Inc.     Stroke Prevention Some medical conditions and lifestyle choices can lead to a higher risk for a stroke. You can  help to prevent a stroke by making nutrition, lifestyle, and other changes. What nutrition changes can be made?   Eat healthy foods. ? Choose foods that are high in fiber. These include:  Fresh fruits.  Fresh vegetables.  Whole grains. ? Eat at least 5 or more servings of fruits and vegetables each day. Try to fill half of your plate at each meal with fruits and vegetables. ? Choose lean protein foods. These include:  Lowfat (lean) cuts of meat.  Chicken without skin.  Fish.  Tofu.  Beans.  Nuts. ? Eat low-fat dairy products. ? Avoid foods that:  Are high in salt (sodium).  Have saturated fat.  Have trans fat.  Have cholesterol.  Are processed.  Are premade.  Follow eating guidelines as told by your doctor. These may include: ? Reducing how many calories you   eat and drink each day. ? Limiting how much salt you eat or drink each day to 1,500 milligrams (mg). ? Using only healthy fats for cooking. These include:  Olive oil.  Canola oil.  Sunflower oil. ? Counting how many carbohydrates you eat and drink each day. What lifestyle changes can be made?  Try to stay at a healthy weight. Talk to your doctor about what a good weight is for you.  Get at least 30 minutes of moderate physical activity at least 5 days a week. This can include: ? Fast walking. ? Biking. ? Swimming.  Do not use any products that have nicotine or tobacco. This includes cigarettes and e-cigarettes. If you need help quitting, ask your doctor. Avoid being around tobacco smoke in general.  Limit how much alcohol you drink to no more than 1 drink a day for nonpregnant women and 2 drinks a day for men. One drink equals 12 oz of beer, 5 oz of wine, or 1 oz of hard liquor.  Do not use drugs.  Avoid taking birth control pills. Talk to your doctor about the risks of taking birth control pills if: ? You are over 35 years old. ? You smoke. ? You get migraines. ? You have had a blood clot.  What other changes can be made?  Manage your cholesterol. ? It is important to eat a healthy diet. ? If your cholesterol cannot be managed through your diet, you may also need to take medicines. Take medicines as told by your doctor.  Manage your diabetes. ? It is important to eat a healthy diet and to exercise regularly. ? If your blood sugar cannot be managed through diet and exercise, you may need to take medicines. Take medicines as told by your doctor.  Control your high blood pressure (hypertension). ? Try to keep your blood pressure below 130/80. This can help lower your risk of stroke. ? It is important to eat a healthy diet and to exercise regularly. ? If your blood pressure cannot be managed through diet and exercise, you may need to take medicines. Take medicines as told by your doctor. ? Ask your doctor if you should check your blood pressure at home. ? Have your blood pressure checked every year. Do this even if your blood pressure is normal.  Talk to your doctor about getting checked for a sleep disorder. Signs of this can include: ? Snoring a lot. ? Feeling very tired.  Take over-the-counter and prescription medicines only as told by your doctor. These may include aspirin or blood thinners (antiplatelets or anticoagulants).  Make sure that any other medical conditions you have are managed. Where to find more information  American Stroke Association: www.strokeassociation.org  National Stroke Association: www.stroke.org Get help right away if:  You have any symptoms of stroke. "BE FAST" is an easy way to remember the main warning signs: ? B - Balance. Signs are dizziness, sudden trouble walking, or loss of balance. ? E - Eyes. Signs are trouble seeing or a sudden change in how you see. ? F - Face. Signs are sudden weakness or loss of feeling of the face, or the face or eyelid drooping on one side. ? A - Arms. Signs are weakness or loss of feeling in an arm. This  happens suddenly and usually on one side of the body. ? S - Speech. Signs are sudden trouble speaking, slurred speech, or trouble understanding what people say. ? T - Time. Time to call emergency   services. Write down what time symptoms started.  You have other signs of stroke, such as: ? A sudden, very bad headache with no known cause. ? Feeling sick to your stomach (nausea). ? Throwing up (vomiting). ? Jerky movements you cannot control (seizure). These symptoms may represent a serious problem that is an emergency. Do not wait to see if the symptoms will go away. Get medical help right away. Call your local emergency services (911 in the [place] Do not drive yourself to the hospital. Summary  You can prevent a stroke by eating healthy, exercising, not smoking, drinking less alcohol, and treating other health problems, such as diabetes, high blood pressure, or high cholesterol.  Do not use any products that contain nicotine or tobacco, such as cigarettes and e-cigarettes.  Get help right away if you have any signs or symptoms of a stroke. This information is not intended to replace advice given to you by your health care provider. Make sure you discuss any questions you have with your health care provider. Document Released: 08/29/2011 Document Revised: 04/25/2018 Document Reviewed: 05/31/2016 Elsevier Patient Education  2020 Elsevier Inc.  

## 2018-09-18 NOTE — Progress Notes (Signed)
Chief Complaint: Follow up Extracranial Carotid Artery Stenosis   History of Present Illness  Jennifer [name] is a 68 y.o. female who is s/p Trans carotid artery revascularization (TCAR) right side on 11-01-16 by Dr. Oneida Alar.   Hx: She apparently had some dizziness as well and states she may have even had 2 syncopal events. Carotid duplex in July 2018 showed 70-99% right carotid stenosis and 50-70% left carotid stenosis. On CT Angio was thought that her stenosis was a high lesion that may be difficult to access from a transcervical approach. The patient currently is on Xareltofor prior DVT and pulmonary embolus. 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 Plavixwas recently added to this prior to her TCAR surgery.   Pt was last evaluated on 11-16-16 by [facility] [employer]. At that time since surgery pt has been taken off her antihypertensive medications. Her BP that day was 123/47. She had a follow up with her cardiologist scheduled.  Pt was to follow up for repeat carotid duplex in 6 months.  She had CABG x 3 vessels in December 2018.  She was having syncopal episodes from October to December 2018. She was anemic, had a colonoscopy in December 2018, bleeding areas noted and were cauterized, pt states, and she has has not seemed to be significantly anemic since this.  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   She denies any known history of stroke or TIA. Specifically she deniesa history of amaurosis fugax or monocular blindness, unilateral facial drooping, hemiplegia, orreceptive or expressive aphasia.    Pt states she was evaluated by a neurologist, Dr. Krista Blue, states it did not seem she had a stroke or TIA, seems that her syncope was due to her CAD and anemia; pt had been released to follow up as needed with Dr. Krista Blue.   She denies claudication like symptoms in her legs with walking.   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 She takes OxyContin for pain, now managed by her PCP per pt.   She states that gabapentin helps with neuropathy in her legs.   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   Diabetic: no Tobacco use: smoker  (1 ppd, started at age 67 yrs)  Pt meds include: Statin : yes ASA: yes Other anticoagulants/antiplatelets: Xarelto, hx of DVT and PE.   Past Medical History:  Diagnosis Date  . Anemia   . Anxiety   . Arthritis    pt. denies  . Bell's palsy   . CAD (coronary artery disease)    a. Cath 12/2014 - mild-moderate non-obstructive CAD with 60% mid RCA, 50% mid Cx-distal Cx, 30% distal LAD, 25% D2, EF 55-65%. b. 02/2017: NSTEMI with cath showing 60% LM stenosis and 100% RCA stenosis --> CT Surgery consulted with CABG on 02/21/2017 (LIMA-LAD, SVG-OM, and SVG-PDA)  . Depression   . Dizziness   . DVT (deep venous thrombosis) (HCC)    on Xarelto  . Dyspnea    W/ EXERTION pt. denies  . GERD (gastroesophageal reflux disease)   . Heart murmur    DX   AT BIRTH  . Hyperlipidemia   . Hypertension   . 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 Myocardial infarction ([place])   .  Neuropathy   . PE (pulmonary embolism)   . Tobacco abuse     Social History Social History   Tobacco Use  . Smoking status: Current Every Day Smoker    Packs/day: 0.25    Years: 50.00    Pack years: 12.50    Types: Cigarettes    Start date: 06/18/1967  . Smokeless tobacco: Never Used  . Tobacco comment: 3-4 a day  Substance Use Topics  . Alcohol use: No    Alcohol/week: 0.0 standard drinks  . Drug use: No    Family History Family History  Problem Relation Age of Onset  . Heart attack Mother   . AAA (abdominal aortic aneurysm) Mother   . Heart attack Father   . Heart disease Father   . Heart attack Brother        CABG  . Heart disease  Brother   . Heart attack Brother        CABG  . Heart attack Brother        CABG  . Ovarian cancer Maternal Grandmother   . Heart disease Maternal Grandfather   . Dementia Paternal Grandmother     Surgical History Past Surgical History:  Procedure Laterality Date  . ABDOMINAL HYSTERECTOMY  1974  . CARDIAC CATHETERIZATION N/A 01/08/2015   Procedure: Left Heart Cath and Coronary Angiography;  Surgeon: Jolaine Artist, MD;  Location: [facility];  Service: Cardiovascular;  Laterality: N/A;  . CAROTID ARTERY ANGIOPLASTY     stent right side  . CARPAL TUNNEL RELEASE     RIGHT  . COLONOSCOPY WITH PROPOFOL N/A 02/16/2017   Procedure: COLONOSCOPY WITH PROPOFOL;  Surgeon: [name], MD;  [facility] END[facility]COPY;  Service: Endoscopy;  Laterality: N/A;  . CORONARY ARTERY BYPASS GRAFT N/A 02/21/2017   Procedure: CORONARY ARTERY BYPASS GRAFTING (CABG) times three. LIMA to LAD, SVG to OM and SVG to PDA;  Surgeon: Melrose Nakayama, MD;  Location: [facility];  Service: Open Heart Surgery;  Laterality: N/A;  . CORONARY STENT PLACEMENT Right 1980  . ENTEROSCOPY N/A 06/14/2017   Procedure: ENTEROSCOPY;  Surgeon: [name], MD;  Location: [facility];  Service: Endoscopy;  Laterality: N/A;  . ESOPHAGOGASTRODUODENOSCOPY (EGD) WITH PROPOFOL N/A 02/16/2017   Procedure: ESOPHAGOGASTRODUODENOSCOPY (EGD) WITH PROPOFOL;  Surgeon: [name], MD;  Location[facility] ENDOSCOPY;  Service: Endoscopy;  Laterality: N/A;  . GIVENS CAPSULE STUDY N/A 05/08/2017   Procedure: GIVENS CAPSULE STUDY;  Surgeon: [name], MD;  Location: [facility];  Service: Endoscopy;  Laterality: N/A;  . KNEE ARTHROSCOPY W/ MENISCAL REPAIR     LEFT KNEE   FORMED DVT (SURG TO REMOVE BLOOD CLOT)  . LEFT HEART CATH AND CORONARY ANGIOGRAPHY N/A 02/19/2017   Procedure: LEFT HEART CATH AND CORONARY ANGIOGRAPHY;  Surgeon: Nelva Bush, MD;  Location: [facility];  Service: Cardiovascular;  Laterality: N/A;   . TEE WITHOUT CARDIOVERSION N/A 02/21/2017   Procedure: TRANSESOPHAGEAL ECHOCARDIOGRAM (TEE);  Surgeon: Melrose Nakayama, MD;  Location: [facility];  Service: Open Heart Surgery;  Laterality: N/A;  . TONSILLECTOMY      Allergies  Allergen Reactions  . Bee Venom Anaphylaxis    Current Outpatient Medications  Medication Sig Dispense Refill  . allopurinol (ZYLOPRIM) 300 MG tablet Take 300 mg by mouth daily.    Marland Kitchen aspirin EC 81 MG EC tablet Take 1 tablet (81 mg total) by mouth daily.    Marland Kitchen atorvastatin (LIPITOR) 20 MG tablet     .  calcium carbonate (TUMS - DOSED IN MG ELEMENTAL CALCIUM) 500 MG chewable tablet Chew 2 tablets by mouth 3 (three) times daily with meals.     . Cholecalciferol (VITAMIN D) 2000 units CAPS Take 2,000 Units by mouth daily.    . DULoxetine (CYMBALTA) 30 MG capsule Take 30 mg by mouth daily.    Marland Kitchen FeFum-FePoly-FA-B Cmp-C-Biot (INTEGRA PLUS) CAPS Take 1 capsule daily with a meal 30 capsule 3  . gabapentin (NEURONTIN) 600 MG tablet Take 300-600 mg by mouth See admin instructions. Take 300 mg twice daily IN THE MORNING AND AT NOON AND  600 mg at bedtime.    . Omega-3 Fatty Acids (FISH OIL) 1360 MG CAPS Take by mouth.    . Oxycodone HCl 10 MG TABS Take 10 mg by mouth 3 (three) times daily as needed. For pain    . rivaroxaban (XARELTO) 20 MG TABS tablet Take 20 mg by mouth daily with supper.    . carvedilol (COREG) 3.125 MG tablet Take 1 tablet (3.125 mg total) by mouth 2 (two) times daily. 180 tablet 3  . lisinopril (PRINIVIL,ZESTRIL) 40 MG tablet Take 1 tablet (40 mg total) by mouth daily. 90 tablet 3   No current facility-administered medications for this visit.     Review of Systems : See HPI for pertinent positives and negatives.  Physical Examination  Vitals:   09/18/18 0902 09/18/18 0905  BP: (!) 149/76 (!) 147/79  Pulse: 77   Resp: 20   Temp: (!) 97.3 F (36.3 C)   SpO2: 98%   Weight: 223 lb (101.2 kg)   Height: 5\' 3"  (1.6 m)    Body mass index is 39.5  kg/m.  General: WDWN obese female in NAD GAIT: normal Eyes: PERRLA HENT: No gross abnormalities.  Pulmonary:  Respirations are non-labored, fair air movement in all fields, no rales,  rhonchi, or  wheezing. Cardiac: regular rhythm, + murmur.  VASCULAR EXAM Carotid Bruits Right Left   Negative Negative     Abdominal aortic pulse is not palpable. Radial pulses are 1+ palpable                                                                                                                             LE Pulses Right Left       POPLITEAL  not palpable   not palpable       POSTERIOR TIBIAL  1+ palpable   1+ palpable        DORSALIS PEDIS      ANTERIOR TIBIAL not palpable  not palpable     Gastrointestinal: soft, nontender, BS WNL, no r/g, no palpable masses. Musculoskeletal: no muscle atrophy/wasting. M/S 5/5 throughout, extremities without ischemic changes. Left lower leg is slightly larger than right. No pitting or non pitting edema.  Skin: No rashes, no ulcers, no cellulitis.   Neurologic:  A&O X 3; appropriate affect, sensation is normal; speech is normal, CN 2-12 intact, pain and light  touch intact in extremities, motor exam as listed above. Psychiatric: Normal thought content, mood appropriate to clinical situation.    Assessment: Jennifer [name] is a 68 y.o. female who is s/p Trans carotid artery revascularization (TCAR) right side on 11-01-16. She has no history of stroke or TIA.  Her atherosclerotic risk factors include 50 year hx of smoking (active), CAD,  and obesity. Fortunatley she does not have DM.  She takes a daily ASA and a statin. She takes Xarelto (long term use of anticoagulant) for a hx of recurrent DVT and PE.  She needs maximal medical management to address her atherosclerotic risk factors.  Hx of DVT, left lower leg larger than right: pt states she has no compression hose. We gave her a pair.    Over 3 minutes was spent counseling patient re smoking  cessation, and patient was given several free resources re smoking cessation.    DATA Carotid Duplex (09-18-18) Right Carotid: Velocities in the right ICA are consistent with a 40-59% stenosis                distal to ICA stent. Non-hemodynamically significant plaque <50%                noted in the CCA. Right ECA not visualized.                Patent right ICA stent. Left Carotid: Velocities in the left ICA are consistent with a 40-59% stenosis.               Non-hemodynamically significant plaque noted in the CCA. The ECA               appears >50% stenosed. Vertebrals:  Bilateral vertebral arteries demonstrate antegrade flow. Subclavians: Left subclavian artery was stenotic. Normal flow hemodynamics were              seen in the right subclavian artery.  No significant change compared to the exams on 05-17-17 and 12-10-17.     Plan: Follow-up in 9 months with Carotid Duplex scan.   I discussed in depth with the patient the nature of atherosclerosis, and emphasized the importance of maximal medical management including strict control of blood pressure, blood glucose, and lipid levels, obtaining regular exercise, and cessation of smoking.  The patient is aware that without maximal medical management the underlying atherosclerotic disease process will progress, limiting the benefit of any interventions. The patient was given information about stroke prevention and what symptoms should prompt the patient to seek immediate medical care. Thank you for allowing [place] to participate in this patient's care.  Clemon Chambers, RN, MSN, FNP-C [facility] Office: [phone]  Clinic Physician: Scot Dock  09/18/18 9:31 AM

## 2018-09-24 ENCOUNTER — Other Ambulatory Visit: Payer: Self-pay

## 2018-09-24 ENCOUNTER — Encounter: Payer: Self-pay | Admitting: Cardiovascular Disease

## 2018-09-24 ENCOUNTER — Ambulatory Visit (INDEPENDENT_AMBULATORY_CARE_PROVIDER_SITE_OTHER): Payer: Medicare Other | Admitting: Cardiovascular Disease

## 2018-09-24 VITALS — BP 148/94 | HR 78 | Temp 98.7°F | Ht 63.0 in | Wt 226.0 lb

## 2018-09-24 DIAGNOSIS — Z720 Tobacco use: Secondary | ICD-10-CM

## 2018-09-24 DIAGNOSIS — I6523 Occlusion and stenosis of bilateral carotid arteries: Secondary | ICD-10-CM

## 2018-09-24 DIAGNOSIS — E785 Hyperlipidemia, unspecified: Secondary | ICD-10-CM

## 2018-09-24 DIAGNOSIS — I25708 Atherosclerosis of coronary artery bypass graft(s), unspecified, with other forms of angina pectoris: Secondary | ICD-10-CM

## 2018-09-24 DIAGNOSIS — I10 Essential (primary) hypertension: Secondary | ICD-10-CM

## 2018-09-24 DIAGNOSIS — I82592 Chronic embolism and thrombosis of other specified deep vein of left lower extremity: Secondary | ICD-10-CM | POA: Diagnosis not present

## 2018-09-24 NOTE — Patient Instructions (Addendum)

## 2018-09-24 NOTE — Progress Notes (Signed)
SUBJECTIVE: The patient presents for follow-up of coronary disease.  She has a history of CABG.  Brain MRI on 03/25/2018 showed no acute findings.  She denies chest pain, palpitations, orthopnea, and shortness of breath.  She has chronic left leg swelling which did leg she had DVT.  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  Review of Systems: As per "subjective", otherwise negative.  Allergies  Allergen Reactions  . Bee Venom Anaphylaxis    Current Outpatient Medications  Medication Sig Dispense Refill  . allopurinol (ZYLOPRIM) 300 MG tablet Take 300 mg by mouth daily.    Marland Kitchen aspirin EC 81 MG EC tablet Take 1 tablet (81 mg total) by mouth daily.    Marland Kitchen atorvastatin (LIPITOR) 20 MG tablet     . calcium carbonate (TUMS - DOSED IN MG ELEMENTAL CALCIUM) 500 MG chewable tablet Chew 2 tablets by mouth 3 (three) times daily with meals.     . Cholecalciferol (VITAMIN D) 2000 units CAPS Take 2,000 Units by mouth daily.    . DULoxetine (CYMBALTA) 30 MG capsule Take 30 mg by mouth daily.    Marland Kitchen FeFum-FePoly-FA-B Cmp-C-Biot (INTEGRA PLUS) CAPS Take 1 capsule daily with a meal 30 capsule 3  . gabapentin (NEURONTIN) 600 MG tablet Take 300-600 mg by mouth See admin instructions. Take 300 mg twice daily IN THE MORNING AND AT NOON AND  600 mg at bedtime.    Marland Kitchen lisinopril (PRINIVIL,ZESTRIL) 40 MG tablet Take 1 tablet (40 mg total) by mouth daily. 90 tablet 3  . Omega-3 Fatty Acids (FISH OIL) 1360 MG CAPS Take by mouth.    . Oxycodone HCl 10 MG TABS Take 10 mg by mouth 3 (three) times daily as needed. For pain    . rivaroxaban (XARELTO) 20 MG TABS tablet Take 20 mg by mouth daily with supper.     No current facility-administered medications for this visit.     Past Medical History:  Diagnosis Date  . Anemia   . Anxiety   . Arthritis    pt. denies  . Bell's palsy   . CAD (coronary artery disease)    a. Cath 12/2014 - mild-moderate non-obstructive CAD with 60% mid RCA,  50% mid Cx-distal Cx, 30% distal LAD, 25% D2, EF 55-65%. b. 02/2017: NSTEMI with cath showing 60% LM stenosis and 100% RCA stenosis --> CT Surgery consulted with CABG on 02/21/2017 (LIMA-LAD, SVG-OM, and SVG-PDA)  . Depression   . Dizziness   . DVT (deep venous thrombosis) (HCC)    on Xarelto  . Dyspnea    W/ EXERTION pt. denies  . GERD (gastroesophageal reflux disease)   . Heart murmur    DX   AT BIRTH  . Hyperlipidemia   . Hypertension   . 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 Myocardial infarction ([place])   . Neuropathy   . PE (pulmonary embolism)   . Tobacco abuse     Past Surgical History:  Procedure Laterality Date  . ABDOMINAL HYSTERECTOMY  1974  . CARDIAC CATHETERIZATION N/A 01/08/2015   Procedure: Left Heart Cath and Coronary Angiography;  Surgeon: Jolaine Artist, MD;  Location: [facility];  Service: Cardiovascular;  Laterality: N/A;  . CAROTID ARTERY ANGIOPLASTY     stent right side  . CARPAL TUNNEL RELEASE     RIGHT  . COLONOSCOPY WITH PROPOFOL N/A 02/16/2017   Procedure: COLONOSCOPY WITH PROPOFOL;  Surgeon: [name],  MD;  Location: [facility];  Service: Endoscopy;  Laterality: N/A;  . CORONARY ARTERY BYPASS GRAFT N/A 02/21/2017   Procedure: CORONARY ARTERY BYPASS GRAFTING (CABG) times three. LIMA to LAD, SVG to OM and SVG to PDA;  Surgeon: Melrose Nakayama, MD;  Location: [facility];  Service: Open Heart Surgery;  Laterality: N/A;  . CORONARY STENT PLACEMENT Right 1980  . ENTEROSCOPY N/A 06/14/2017   Procedure: ENTEROSCOPY;  Surgeon: [name], MD;  Location: [facility];  Service: Endoscopy;  Laterality: N/A;  . ESOPHAGOGASTRODUODENOSCOPY (EGD) WITH PROPOFOL N/A 02/16/2017   Procedure: ESOPHAGOGASTRODUODENOSCOPY (EGD) WITH PROPOFOL;  Surgeon: [name], MD;  Location: [facility];  Service: Endoscopy;  Laterality: N/A;  . GIVENS CAPSULE STUDY N/A 05/08/2017   Procedure: GIVENS CAPSULE STUDY;  Surgeon: [name], MD;   Location: [facility];  Service: Endoscopy;  Laterality: N/A;  . KNEE ARTHROSCOPY W/ MENISCAL REPAIR     LEFT KNEE   FORMED DVT (SURG TO REMOVE BLOOD CLOT)  . LEFT HEART CATH AND CORONARY ANGIOGRAPHY N/A 02/19/2017   Procedure: LEFT HEART CATH AND CORONARY ANGIOGRAPHY;  Surgeon: Nelva Bush, MD;  Location: [facility];  Service: Cardiovascular;  Laterality: N/A;  . TEE WITHOUT CARDIOVERSION N/A 02/21/2017   Procedure: TRANSESOPHAGEAL ECHOCARDIOGRAM (TEE);  Surgeon: Melrose Nakayama, MD;  Location: [facility];  Service: Open Heart Surgery;  Laterality: N/A;  . TONSILLECTOMY      Social History   Socioeconomic History  . Marital status: Single    Spouse name: Not on file  . Number of children: 2  . Years of education: 19  . Highest education level: Not on file  Occupational History  . Occupation: Retired  Scientific laboratory technician  . Financial resource strain: Not on file  . Food insecurity    Worry: Not on file    Inability: Not on file  . Transportation needs    Medical: Not on file    Non-medical: Not on file  Tobacco Use  . Smoking status: Current Every Day Smoker    Packs/day: 0.25    Years: 50.00    Pack years: 12.50    Types: Cigarettes    Start date: 06/18/1967  . Smokeless tobacco: Never Used  . Tobacco comment: 3-4 a day  Substance and Sexual Activity  . Alcohol use: No    Alcohol/week: 0.0 standard drinks  . Drug use: No  . Sexual activity: Not Currently  Lifestyle  . Physical activity    Days per week: Not on file    Minutes per session: Not on file  . Stress: Not on file  Relationships  . Social Herbalist on phone: Not on file    Gets together: Not on file    Attends religious service: Not on file    Active member of club or organization: Not on file    Attends meetings of clubs or organizations: Not on file    Relationship status: Not on file  . Intimate partner violence    Fear of current or ex partner: Not on file    Emotionally abused:  Not on file    Physically abused: Not on file    Forced sexual activity: Not on file  Other Topics Concern  . Not on file  Social History Narrative   Lives at home alone.   Right-handed.   2 cups caffeine per day.     Vitals:   09/24/18 1407  BP: (!) 148/94  Pulse:  78  Temp: 98.7 F (37.1 C)  SpO2: 98%  Weight: 226 lb (102.5 kg)  Height: 5\' 3"  (1.6 m)    Wt Readings from Last 3 Encounters:  09/24/18 226 lb (102.5 kg)  09/18/18 223 lb (101.2 kg)  03/20/18 228 lb (103.4 kg)     PHYSICAL EXAM General: NAD HEENT: Normal. Neck: No JVD, no thyromegaly. Lungs: Clear to auscultation bilaterally with normal respiratory effort. CV: Regular rate and rhythm, normal S1/S2, no S3/S4, no murmur.  Chronic left leg swelling.  No carotid bruit.   Abdomen: Soft, nontender, no distention.  Neurologic: Alert and oriented.  Psych: Normal affect. Skin: Normal. Musculoskeletal: No gross deformities.    ECG: Reviewed above under Subjective   Labs: Lab Results  Component Value Date/Time   K 4.0 03/20/2018 01:34 PM   BUN 10 03/20/2018 01:34 PM   CREATININE 0.96 03/20/2018 01:34 PM   ALT 16 02/13/2017 01:35 PM   TSH 1.480 06/20/2016 02:43 PM   HGB 15.0 01/28/2018 11:03 AM     Lipids: Lab Results  Component Value Date/Time   LDLCALC 67 02/14/2017 08:41 AM   CHOL 129 02/14/2017 08:41 AM   TRIG 162 (H) 02/14/2017 08:41 AM   HDL 30 (L) 02/14/2017 08:41 AM       ASSESSMENT AND PLAN:  1.  Coronary artery disease: Symptomatically stable.  History of CABG in December 2018 with LIMA to the LAD, SVG to the OM, and SVG to the PDA.  Continue aspirin and atorvastatin.  She did not tolerate carvedilol.  2.  History of DVT: She remains on Xarelto 20 mg daily.  3.  Hypertension: Blood pressure remains elevated today but says it has been normal when checked by her PCP.  She did not tolerate carvedilol.  Currently on lisinopril.  No changes for now.  4.  Hyperlipidemia: Continue  atorvastatin 40 mg.  Goal LDL less than 70.  5.  Tobacco abuse: Previously smoking 1.5 packs of cigarettes daily but has reduced this to 3 to 4 cigarettes daily with the assistance of Chantix.  6.  Bilateral carotid artery stenosis: Followed by vascular surgery. Currently on aspirin and statin.    Disposition: Follow up 1 yr   Kate Sable, M.D., F.A.C.C.

## 2018-09-26 DIAGNOSIS — I10 Essential (primary) hypertension: Secondary | ICD-10-CM | POA: Diagnosis not present

## 2018-09-26 DIAGNOSIS — Z1389 Encounter for screening for other disorder: Secondary | ICD-10-CM | POA: Diagnosis not present

## 2018-09-26 DIAGNOSIS — Z0001 Encounter for general adult medical examination with abnormal findings: Secondary | ICD-10-CM | POA: Diagnosis not present

## 2018-09-26 DIAGNOSIS — G894 Chronic pain syndrome: Secondary | ICD-10-CM | POA: Diagnosis not present

## 2018-09-26 DIAGNOSIS — I82402 Acute embolism and thrombosis of unspecified deep veins of left lower extremity: Secondary | ICD-10-CM | POA: Diagnosis not present

## 2018-10-25 DIAGNOSIS — G894 Chronic pain syndrome: Secondary | ICD-10-CM | POA: Diagnosis not present

## 2018-11-28 DIAGNOSIS — G894 Chronic pain syndrome: Secondary | ICD-10-CM | POA: Diagnosis not present

## 2018-12-11 DIAGNOSIS — E782 Mixed hyperlipidemia: Secondary | ICD-10-CM | POA: Diagnosis not present

## 2018-12-11 DIAGNOSIS — I509 Heart failure, unspecified: Secondary | ICD-10-CM | POA: Diagnosis not present

## 2018-12-11 DIAGNOSIS — I10 Essential (primary) hypertension: Secondary | ICD-10-CM | POA: Diagnosis not present

## 2018-12-27 DIAGNOSIS — E7849 Other hyperlipidemia: Secondary | ICD-10-CM | POA: Diagnosis not present

## 2018-12-27 DIAGNOSIS — G894 Chronic pain syndrome: Secondary | ICD-10-CM | POA: Diagnosis not present

## 2018-12-27 DIAGNOSIS — I509 Heart failure, unspecified: Secondary | ICD-10-CM | POA: Diagnosis not present

## 2018-12-27 DIAGNOSIS — Z23 Encounter for immunization: Secondary | ICD-10-CM | POA: Diagnosis not present

## 2019-01-22 ENCOUNTER — Other Ambulatory Visit: Payer: Self-pay | Admitting: Student

## 2019-01-24 DIAGNOSIS — I509 Heart failure, unspecified: Secondary | ICD-10-CM | POA: Diagnosis not present

## 2019-01-24 DIAGNOSIS — Z86718 Personal history of other venous thrombosis and embolism: Secondary | ICD-10-CM | POA: Diagnosis not present

## 2019-01-24 DIAGNOSIS — G894 Chronic pain syndrome: Secondary | ICD-10-CM | POA: Diagnosis not present

## 2019-02-21 DIAGNOSIS — I25110 Atherosclerotic heart disease of native coronary artery with unstable angina pectoris: Secondary | ICD-10-CM | POA: Diagnosis not present

## 2019-02-21 DIAGNOSIS — I2699 Other pulmonary embolism without acute cor pulmonale: Secondary | ICD-10-CM | POA: Diagnosis not present

## 2019-02-21 DIAGNOSIS — G894 Chronic pain syndrome: Secondary | ICD-10-CM | POA: Diagnosis not present

## 2019-02-21 DIAGNOSIS — I4891 Unspecified atrial fibrillation: Secondary | ICD-10-CM | POA: Diagnosis not present

## 2019-02-24 IMAGING — CT CT ABD-PELV W/ CM
2 of 5 series · 16 of 46 positions shown, 18 images · IV contrast (Isovue)
Comparison: 04/13/2016

CLINICAL DATA: Vomiting for several days with decreased appetite

EXAM:
CT ABDOMEN AND PELVIS WITH CONTRAST
TECHNIQUE: Multidetector CT imaging of the abdomen and pelvis was performed
using the standard protocol following bolus administration of
intravenous contrast.
CONTRAST:  100mL 8YVI5V-7CC IOPAMIDOL (8YVI5V-7CC) INJECTION 61%

[Series 2: axial st · axial · 0.78mm/px · z∈[-575,-180]mm · 13 of 91 slices shown, 15 images]
[im 6/91  soft-tissue]
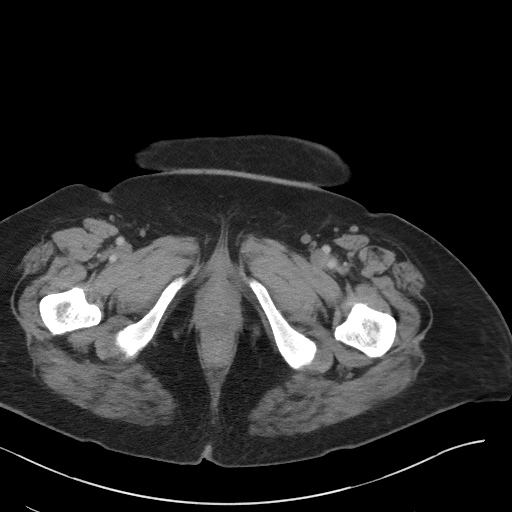
[im 6/91  bone]
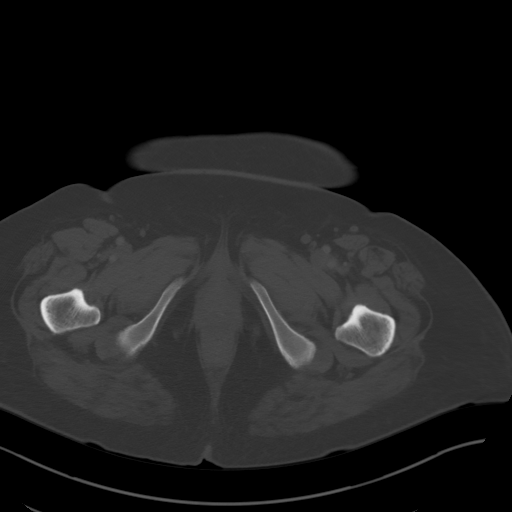
[im 11/91  soft-tissue]
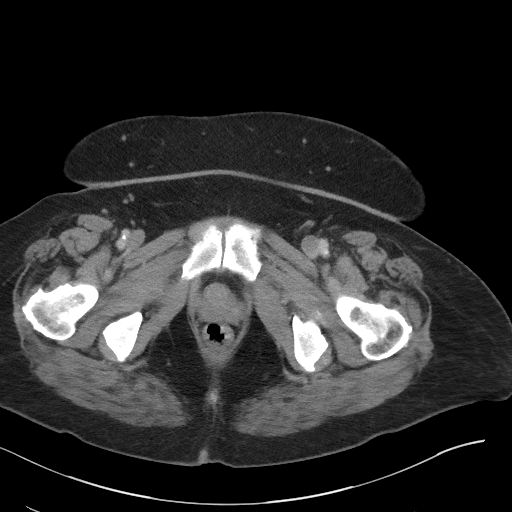
[im 22/91  soft-tissue]
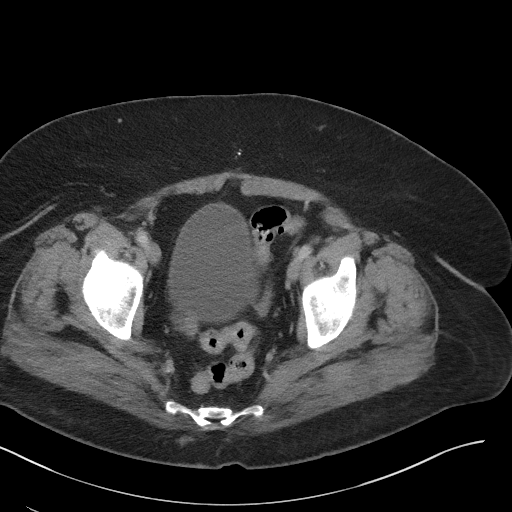
[im 27/91  soft-tissue]
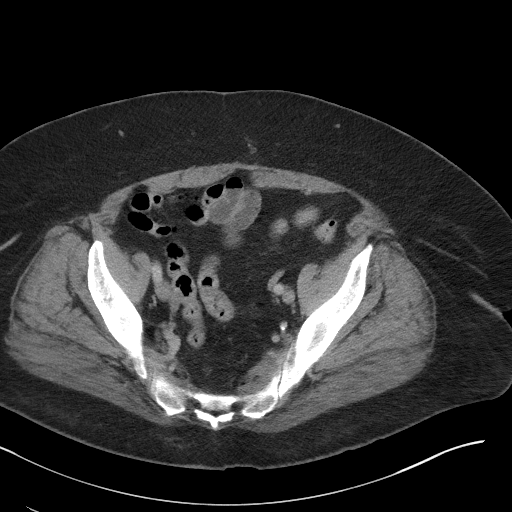
[im 32/91  soft-tissue]
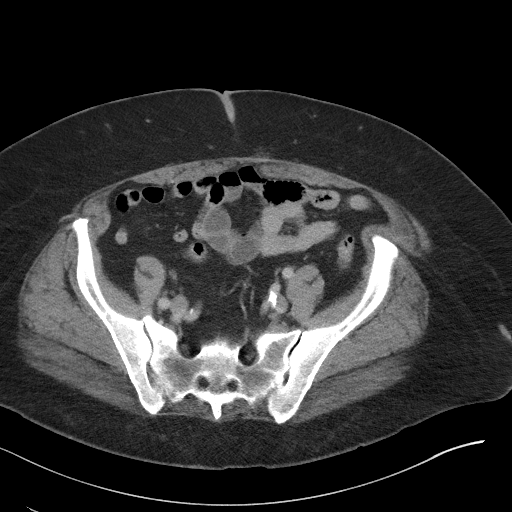
[im 38/91  soft-tissue]
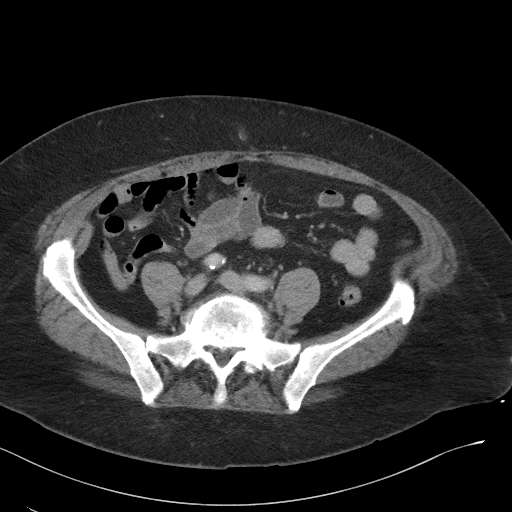
[im 48/91  soft-tissue]
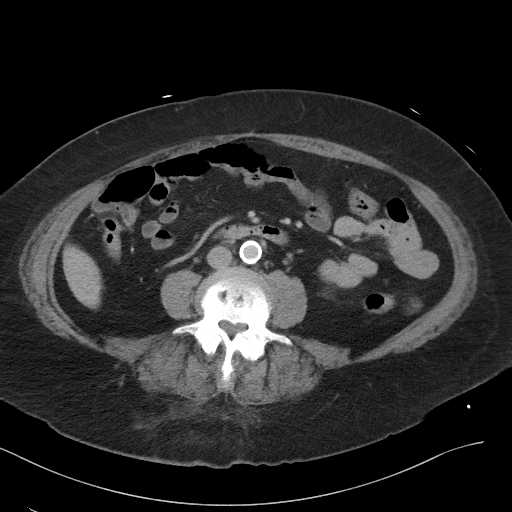
[im 53/91  soft-tissue]
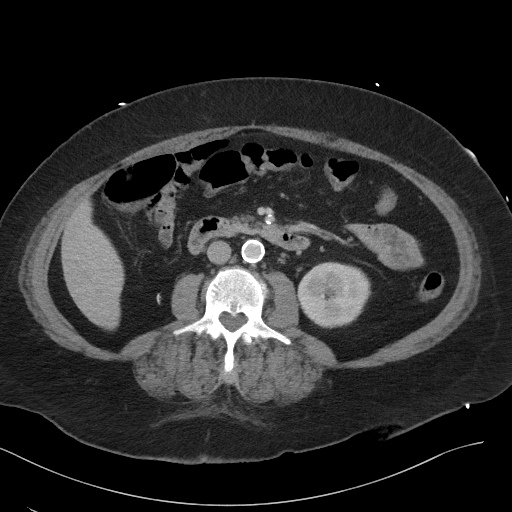
[im 59/91  soft-tissue]
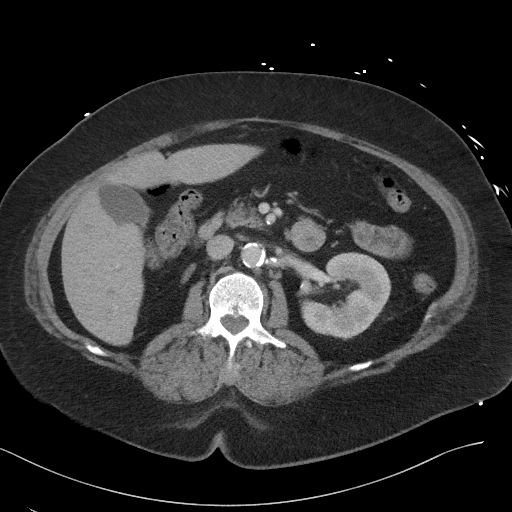
[im 59/91  bone]
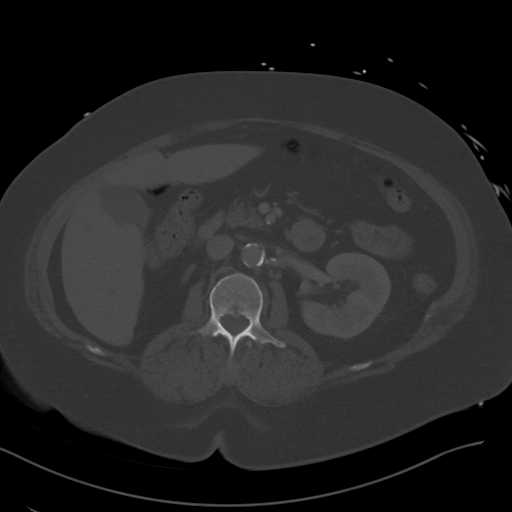
[im 64/91  soft-tissue]
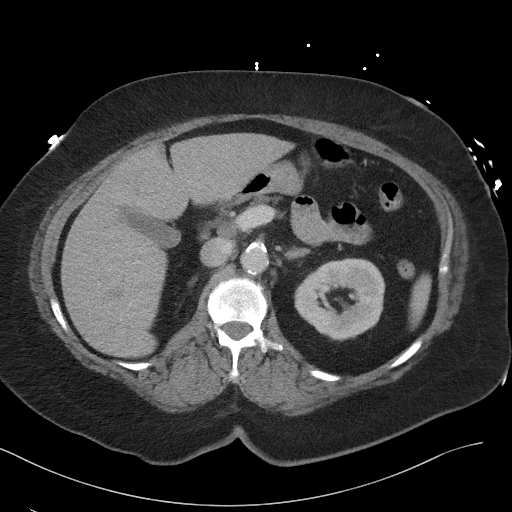
[im 69/91  soft-tissue]
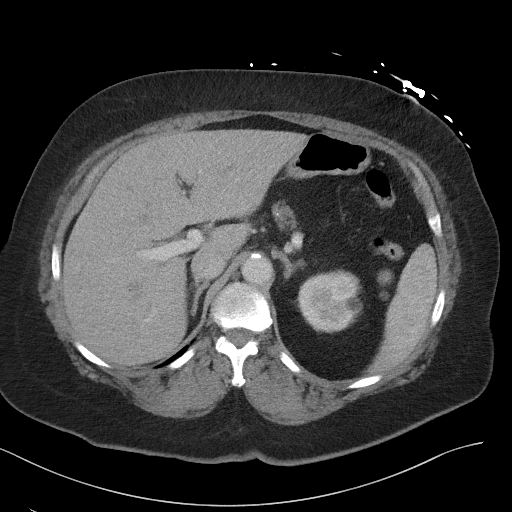
[im 80/91  soft-tissue]
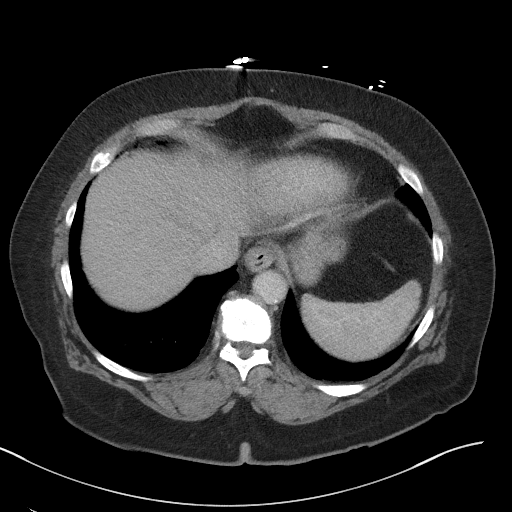
[im 85/91  soft-tissue]
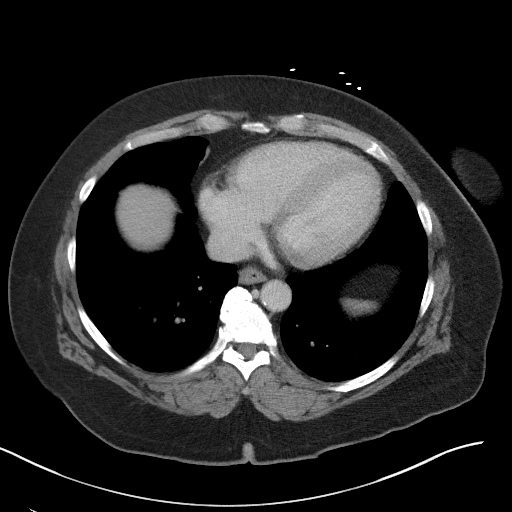

[Series 6: coronal st · coronal · 0.80mm/px · 3 of 101 slices shown]
[im 34/101  soft-tissue]
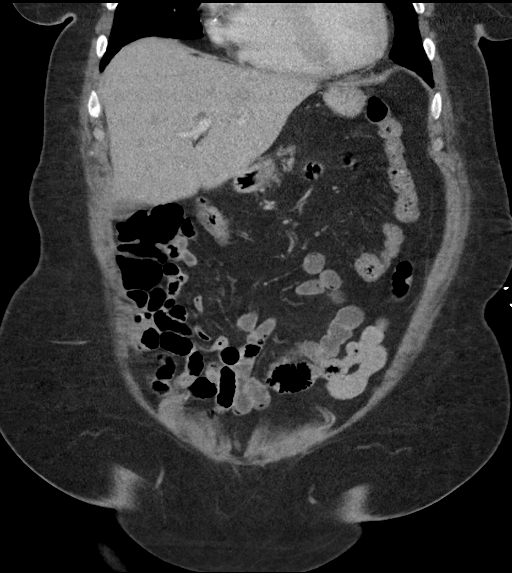
[im 45/101  soft-tissue]
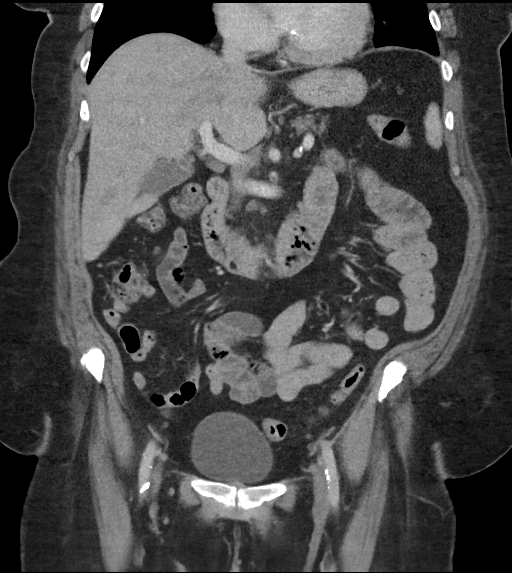
[im 56/101  soft-tissue]
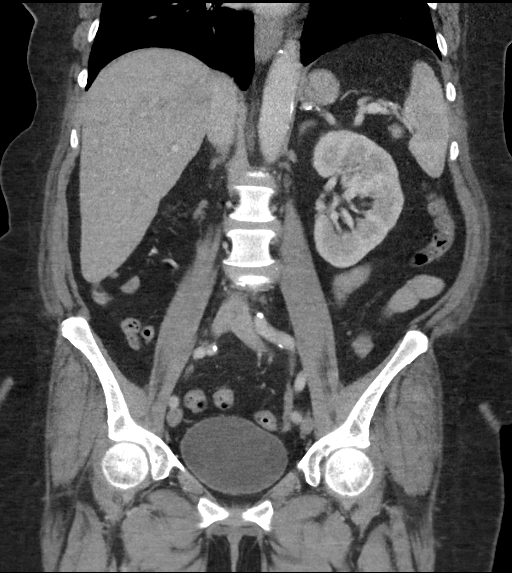

[16 of 46 positions shown; findings below may reference images not displayed]

FINDINGS: Lower chest: No acute abnormality.

Hepatobiliary: No focal liver abnormality is seen. No gallstones,
gallbladder wall thickening, or biliary dilatation.

Pancreas: Unremarkable. No pancreatic ductal dilatation or
surrounding inflammatory changes.

Spleen: Normal in size without focal abnormality.

Adrenals/Urinary Tract: The adrenal glands are within normal limits.
Solitary left kidney is noted. No renal calculi or obstructive
changes are noted. A simple cyst is noted in the upper pole stable
from the prior exam. No obstructive changes are noted. The right
kidney is severely shrunken and calcified stable from the prior
exam. The bladder is well distended.

Stomach/Bowel: Scattered diverticular change of the colon is noted.
The colon is predominately decompressed. No inflammatory or
obstructive changes are seen. The appendix is within normal limits.
No obstructive or inflammatory changes in small bowel are seen.

Vascular/Lymphatic: Aortic atherosclerosis. No enlarged abdominal or
pelvic lymph nodes.

Reproductive: Status post hysterectomy. No adnexal masses.

Other: No abdominal wall hernia or abnormality. No abdominopelvic
ascites.

Musculoskeletal: Mild degenerative changes of lumbar spine are
noted. No acute abnormality seen.
IMPRESSION: Chronic changes as described above stable from the prior exam. No
acute abnormality to correspond with the patient's clinical
symptomatology is noted.

## 2019-02-24 IMAGING — DX DG CHEST 2V
2 series · 2 of 2 positions shown · non-contrast
Comparison: CT 01/06/2015

CLINICAL DATA: emesis x4 days with increased reflux. --- diarrhea
that started x2 days ago as well. PT states generalized malaise and
poor appetite with body aches for weeks.

EXAM:
CHEST - 2 VIEW

[chest pa]
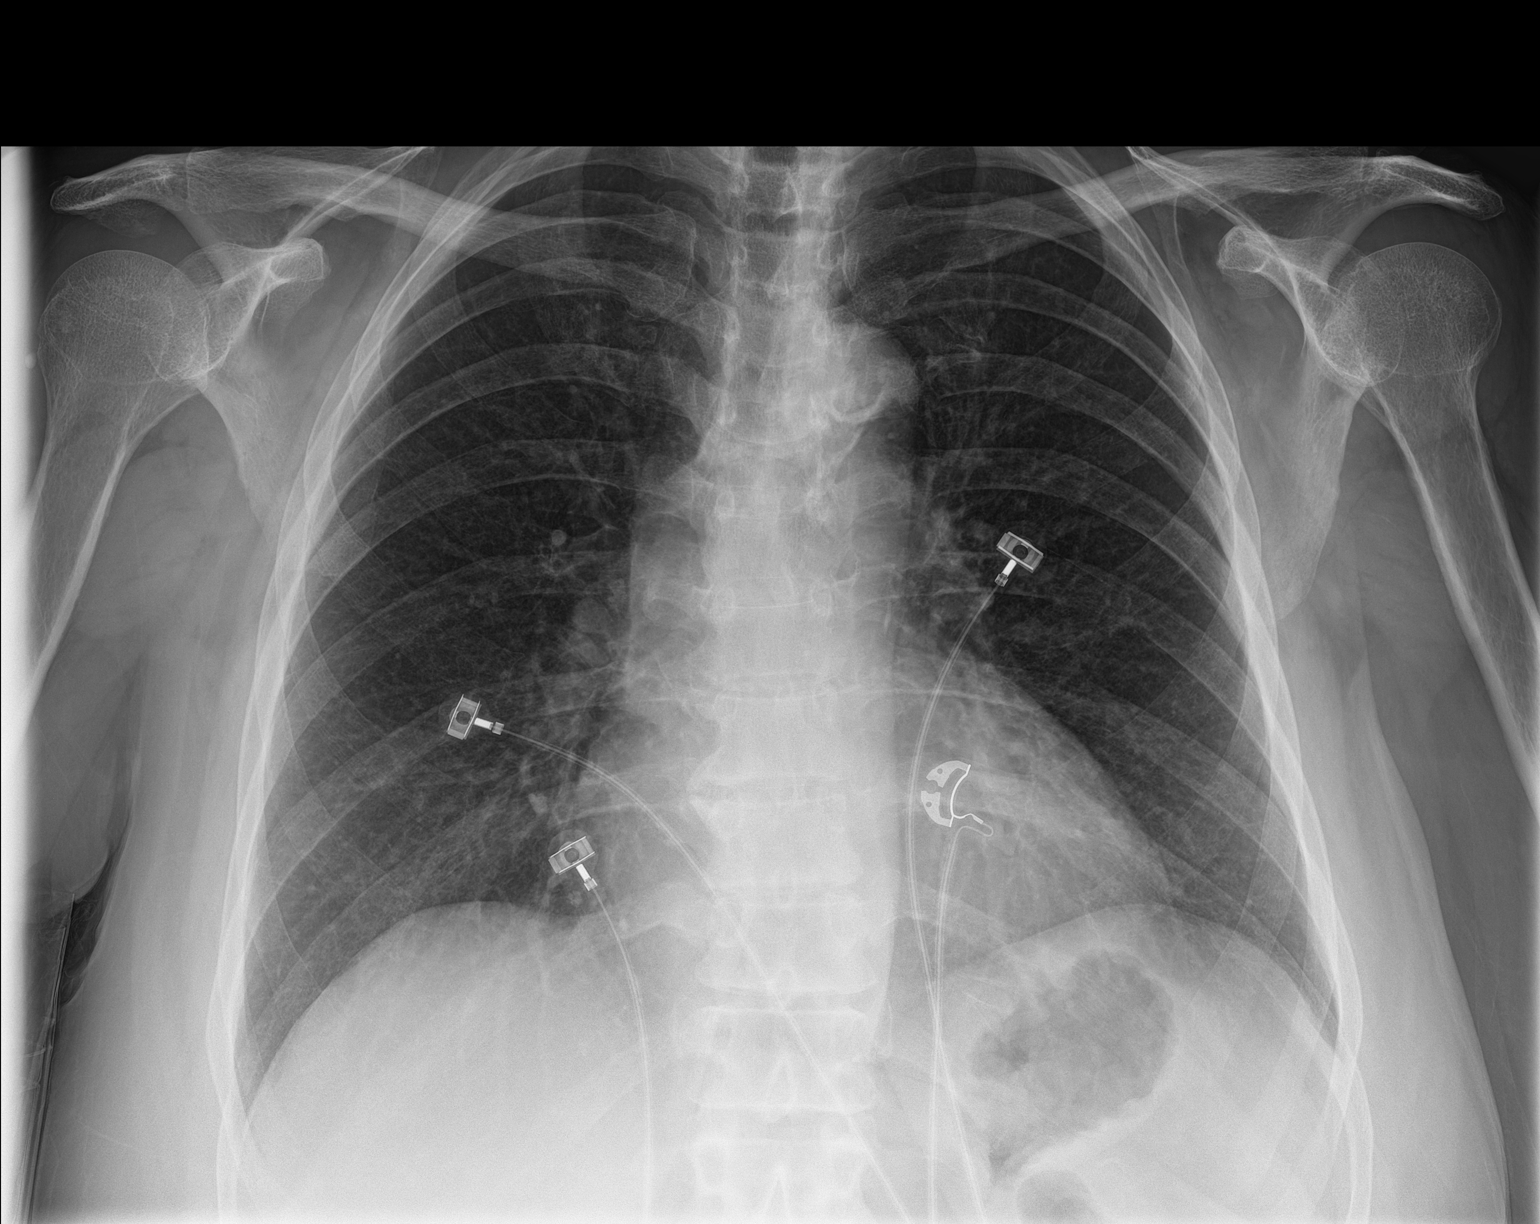

[chest lat]
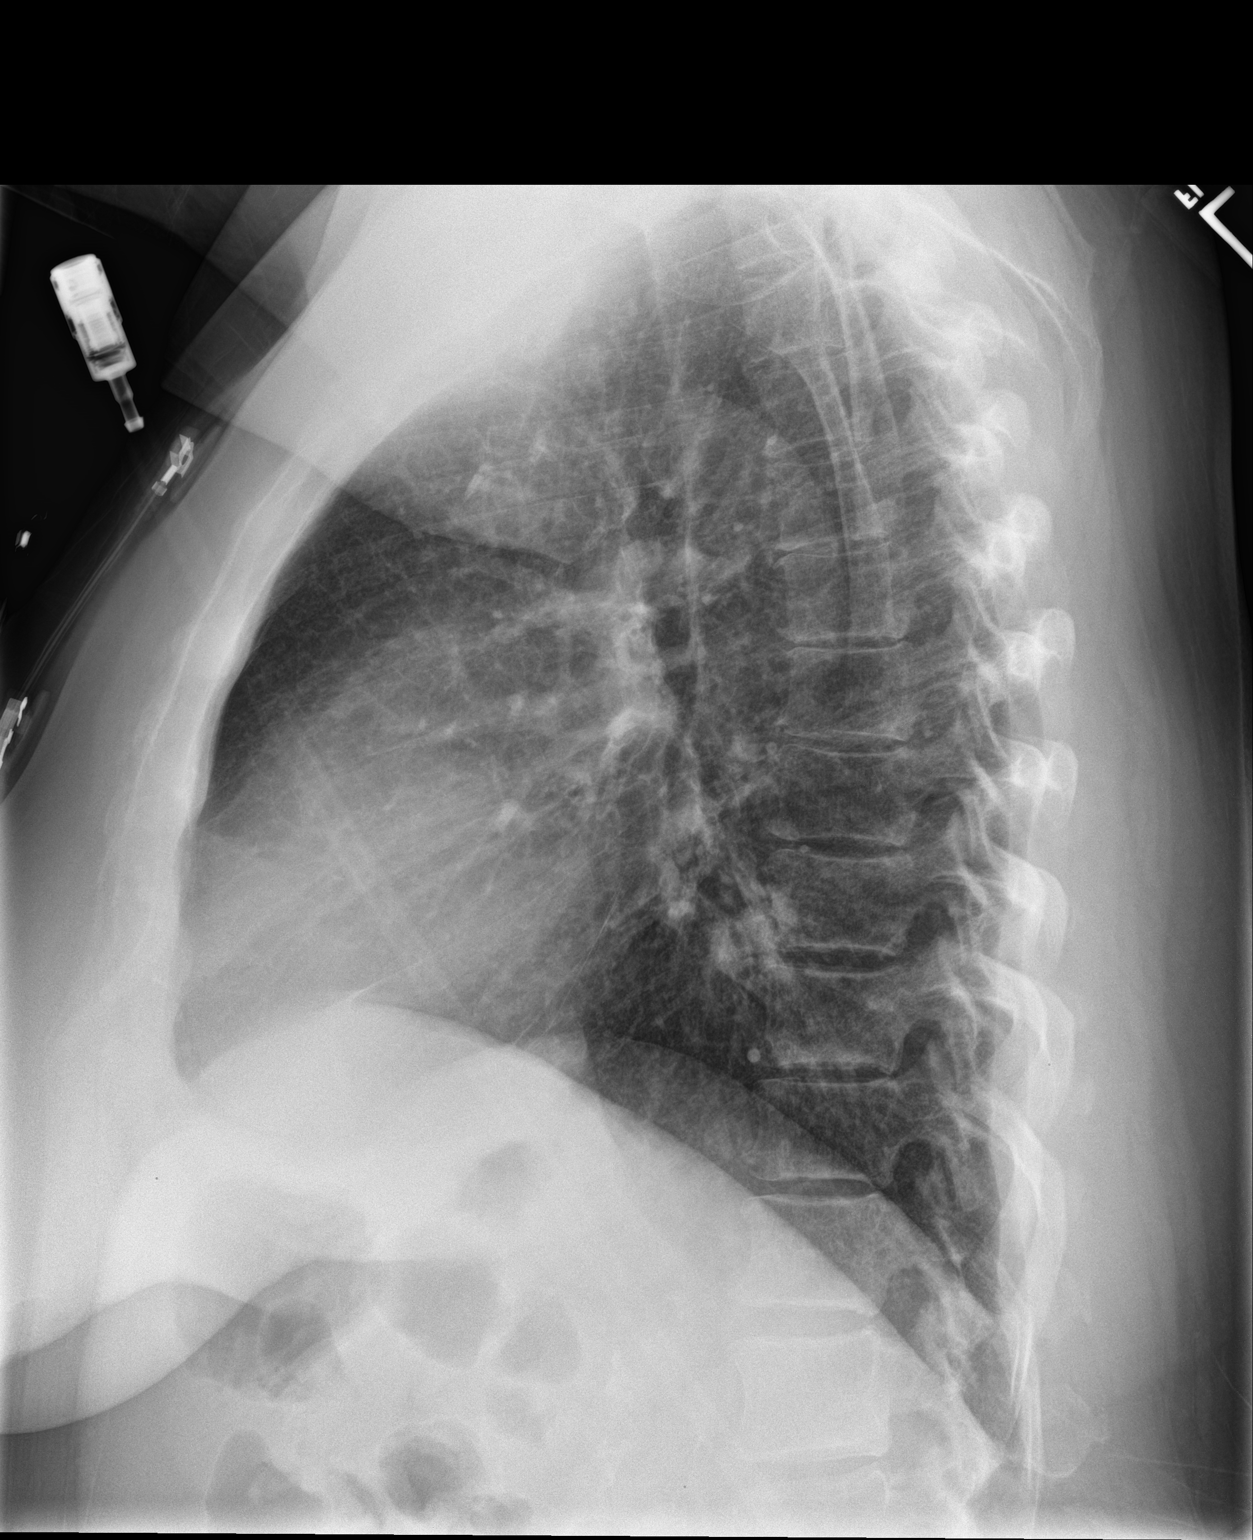

[2 of 2 positions shown; findings below may reference images not displayed]

FINDINGS: Lungs are clear.

Heart size upper limits normal.  Atheromatous aorta.

No effusion.  No pneumothorax.

Visualized bones unremarkable.
IMPRESSION: No acute cardiopulmonary disease.

Aortic Atherosclerosis (BR3XM-170.0)

## 2019-02-26 ENCOUNTER — Other Ambulatory Visit: Payer: Self-pay

## 2019-02-26 MED ORDER — LISINOPRIL 40 MG PO TABS: 40.0000 mg | ORAL_TABLET | Freq: Every day | ORAL | 3 refills | Status: DC

## 2019-03-27 DIAGNOSIS — Z1389 Encounter for screening for other disorder: Secondary | ICD-10-CM | POA: Diagnosis not present

## 2019-03-27 DIAGNOSIS — Z86718 Personal history of other venous thrombosis and embolism: Secondary | ICD-10-CM | POA: Diagnosis not present

## 2019-03-27 DIAGNOSIS — I10 Essential (primary) hypertension: Secondary | ICD-10-CM | POA: Diagnosis not present

## 2019-03-27 DIAGNOSIS — G894 Chronic pain syndrome: Secondary | ICD-10-CM | POA: Diagnosis not present

## 2019-03-31 DIAGNOSIS — S233XXA Sprain of ligaments of thoracic spine, initial encounter: Secondary | ICD-10-CM | POA: Diagnosis not present

## 2019-03-31 DIAGNOSIS — M5441 Lumbago with sciatica, right side: Secondary | ICD-10-CM | POA: Diagnosis not present

## 2019-03-31 DIAGNOSIS — M47816 Spondylosis without myelopathy or radiculopathy, lumbar region: Secondary | ICD-10-CM | POA: Diagnosis not present

## 2019-03-31 DIAGNOSIS — M9903 Segmental and somatic dysfunction of lumbar region: Secondary | ICD-10-CM | POA: Diagnosis not present

## 2019-04-02 DIAGNOSIS — M47816 Spondylosis without myelopathy or radiculopathy, lumbar region: Secondary | ICD-10-CM | POA: Diagnosis not present

## 2019-04-02 DIAGNOSIS — M5441 Lumbago with sciatica, right side: Secondary | ICD-10-CM | POA: Diagnosis not present

## 2019-04-02 DIAGNOSIS — M9903 Segmental and somatic dysfunction of lumbar region: Secondary | ICD-10-CM | POA: Diagnosis not present

## 2019-04-02 DIAGNOSIS — S233XXA Sprain of ligaments of thoracic spine, initial encounter: Secondary | ICD-10-CM | POA: Diagnosis not present

## 2019-04-09 DIAGNOSIS — M9903 Segmental and somatic dysfunction of lumbar region: Secondary | ICD-10-CM | POA: Diagnosis not present

## 2019-04-09 DIAGNOSIS — M5441 Lumbago with sciatica, right side: Secondary | ICD-10-CM | POA: Diagnosis not present

## 2019-04-09 DIAGNOSIS — S233XXA Sprain of ligaments of thoracic spine, initial encounter: Secondary | ICD-10-CM | POA: Diagnosis not present

## 2019-04-09 DIAGNOSIS — M47816 Spondylosis without myelopathy or radiculopathy, lumbar region: Secondary | ICD-10-CM | POA: Diagnosis not present

## 2019-04-13 DIAGNOSIS — Z720 Tobacco use: Secondary | ICD-10-CM | POA: Diagnosis not present

## 2019-04-13 DIAGNOSIS — G894 Chronic pain syndrome: Secondary | ICD-10-CM | POA: Diagnosis not present

## 2019-04-13 DIAGNOSIS — I25708 Atherosclerosis of coronary artery bypass graft(s), unspecified, with other forms of angina pectoris: Secondary | ICD-10-CM | POA: Diagnosis not present

## 2019-04-16 DIAGNOSIS — M9903 Segmental and somatic dysfunction of lumbar region: Secondary | ICD-10-CM | POA: Diagnosis not present

## 2019-04-16 DIAGNOSIS — M9902 Segmental and somatic dysfunction of thoracic region: Secondary | ICD-10-CM | POA: Diagnosis not present

## 2019-04-16 DIAGNOSIS — M9901 Segmental and somatic dysfunction of cervical region: Secondary | ICD-10-CM | POA: Diagnosis not present

## 2019-04-22 DIAGNOSIS — M9902 Segmental and somatic dysfunction of thoracic region: Secondary | ICD-10-CM | POA: Diagnosis not present

## 2019-04-22 DIAGNOSIS — M9903 Segmental and somatic dysfunction of lumbar region: Secondary | ICD-10-CM | POA: Diagnosis not present

## 2019-04-22 DIAGNOSIS — M9901 Segmental and somatic dysfunction of cervical region: Secondary | ICD-10-CM | POA: Diagnosis not present

## 2019-04-23 ENCOUNTER — Other Ambulatory Visit (HOSPITAL_COMMUNITY): Payer: Self-pay | Admitting: Family Medicine

## 2019-04-23 ENCOUNTER — Other Ambulatory Visit: Payer: Self-pay | Admitting: Cardiovascular Disease

## 2019-04-23 DIAGNOSIS — Z1231 Encounter for screening mammogram for malignant neoplasm of breast: Secondary | ICD-10-CM

## 2019-04-25 DIAGNOSIS — I509 Heart failure, unspecified: Secondary | ICD-10-CM | POA: Diagnosis not present

## 2019-04-25 DIAGNOSIS — G894 Chronic pain syndrome: Secondary | ICD-10-CM | POA: Diagnosis not present

## 2019-04-29 DIAGNOSIS — M9901 Segmental and somatic dysfunction of cervical region: Secondary | ICD-10-CM | POA: Diagnosis not present

## 2019-04-29 DIAGNOSIS — M9903 Segmental and somatic dysfunction of lumbar region: Secondary | ICD-10-CM | POA: Diagnosis not present

## 2019-04-29 DIAGNOSIS — M9902 Segmental and somatic dysfunction of thoracic region: Secondary | ICD-10-CM | POA: Diagnosis not present

## 2019-04-30 ENCOUNTER — Other Ambulatory Visit: Payer: Self-pay

## 2019-04-30 ENCOUNTER — Ambulatory Visit (HOSPITAL_COMMUNITY)
Admission: RE | Admit: 2019-04-30 | Discharge: 2019-04-30 | Disposition: A | Discharge status: Home / Self Care | Payer: Medicare Other | Source: Ambulatory Visit | Attending: Family Medicine | Admitting: Family Medicine

## 2019-04-30 DIAGNOSIS — Z1231 Encounter for screening mammogram for malignant neoplasm of breast: Secondary | ICD-10-CM | POA: Insufficient documentation

## 2019-05-02 DIAGNOSIS — M9903 Segmental and somatic dysfunction of lumbar region: Secondary | ICD-10-CM | POA: Diagnosis not present

## 2019-05-02 DIAGNOSIS — M9901 Segmental and somatic dysfunction of cervical region: Secondary | ICD-10-CM | POA: Diagnosis not present

## 2019-05-02 DIAGNOSIS — M9902 Segmental and somatic dysfunction of thoracic region: Secondary | ICD-10-CM | POA: Diagnosis not present

## 2019-05-09 DIAGNOSIS — M9903 Segmental and somatic dysfunction of lumbar region: Secondary | ICD-10-CM | POA: Diagnosis not present

## 2019-05-09 DIAGNOSIS — M9901 Segmental and somatic dysfunction of cervical region: Secondary | ICD-10-CM | POA: Diagnosis not present

## 2019-05-09 DIAGNOSIS — M9902 Segmental and somatic dysfunction of thoracic region: Secondary | ICD-10-CM | POA: Diagnosis not present

## 2019-05-12 DIAGNOSIS — M9903 Segmental and somatic dysfunction of lumbar region: Secondary | ICD-10-CM | POA: Diagnosis not present

## 2019-05-12 DIAGNOSIS — M9901 Segmental and somatic dysfunction of cervical region: Secondary | ICD-10-CM | POA: Diagnosis not present

## 2019-05-12 DIAGNOSIS — M9902 Segmental and somatic dysfunction of thoracic region: Secondary | ICD-10-CM | POA: Diagnosis not present

## 2019-05-15 DIAGNOSIS — M9902 Segmental and somatic dysfunction of thoracic region: Secondary | ICD-10-CM | POA: Diagnosis not present

## 2019-05-15 DIAGNOSIS — M9903 Segmental and somatic dysfunction of lumbar region: Secondary | ICD-10-CM | POA: Diagnosis not present

## 2019-05-15 DIAGNOSIS — M9901 Segmental and somatic dysfunction of cervical region: Secondary | ICD-10-CM | POA: Diagnosis not present

## 2019-05-17 ENCOUNTER — Ambulatory Visit: Payer: Medicare Other | Attending: Internal Medicine

## 2019-05-17 ENCOUNTER — Other Ambulatory Visit: Payer: Self-pay

## 2019-05-17 DIAGNOSIS — Z23 Encounter for immunization: Secondary | ICD-10-CM | POA: Insufficient documentation

## 2019-05-17 NOTE — Progress Notes (Signed)
   Covid-19 Vaccination Clinic  Name:  Jennifer [name]    MRN: [id number] DOB: 1950/10/31  05/17/2019  Jennifer [name] was observed post Covid-19 immunization for 15 minutes without incident. She was provided with Vaccine Information Sheet and instruction to access the V-Safe system.   Jennifer [name] was instructed to call 911 with any severe reactions post vaccine: Marland Kitchen Difficulty breathing  . Swelling of face and throat  . A fast heartbeat  . A bad rash all over body  . Dizziness and weakness   Immunizations Administered    Name Date Dose VIS Date Route   Moderna COVID-19 Vaccine 05/17/2019 10:46 AM 0.5 mL 02/11/2019 Intramuscular   Manufacturer: Moderna   Lot: [id number]   [id number]

## 2019-05-19 DIAGNOSIS — M9903 Segmental and somatic dysfunction of lumbar region: Secondary | ICD-10-CM | POA: Diagnosis not present

## 2019-05-19 DIAGNOSIS — M9901 Segmental and somatic dysfunction of cervical region: Secondary | ICD-10-CM | POA: Diagnosis not present

## 2019-05-19 DIAGNOSIS — M9902 Segmental and somatic dysfunction of thoracic region: Secondary | ICD-10-CM | POA: Diagnosis not present

## 2019-05-22 ENCOUNTER — Other Ambulatory Visit: Payer: Self-pay | Admitting: Cardiovascular Disease

## 2019-05-23 DIAGNOSIS — R252 Cramp and spasm: Secondary | ICD-10-CM | POA: Diagnosis not present

## 2019-05-23 DIAGNOSIS — G894 Chronic pain syndrome: Secondary | ICD-10-CM | POA: Diagnosis not present

## 2019-05-28 DIAGNOSIS — M9901 Segmental and somatic dysfunction of cervical region: Secondary | ICD-10-CM | POA: Diagnosis not present

## 2019-05-28 DIAGNOSIS — M9902 Segmental and somatic dysfunction of thoracic region: Secondary | ICD-10-CM | POA: Diagnosis not present

## 2019-05-28 DIAGNOSIS — M9903 Segmental and somatic dysfunction of lumbar region: Secondary | ICD-10-CM | POA: Diagnosis not present

## 2019-06-04 DIAGNOSIS — M9903 Segmental and somatic dysfunction of lumbar region: Secondary | ICD-10-CM | POA: Diagnosis not present

## 2019-06-04 DIAGNOSIS — M9901 Segmental and somatic dysfunction of cervical region: Secondary | ICD-10-CM | POA: Diagnosis not present

## 2019-06-04 DIAGNOSIS — M9902 Segmental and somatic dysfunction of thoracic region: Secondary | ICD-10-CM | POA: Diagnosis not present

## 2019-06-06 DIAGNOSIS — M9903 Segmental and somatic dysfunction of lumbar region: Secondary | ICD-10-CM | POA: Diagnosis not present

## 2019-06-06 DIAGNOSIS — M9901 Segmental and somatic dysfunction of cervical region: Secondary | ICD-10-CM | POA: Diagnosis not present

## 2019-06-06 DIAGNOSIS — M9902 Segmental and somatic dysfunction of thoracic region: Secondary | ICD-10-CM | POA: Diagnosis not present

## 2019-06-11 DIAGNOSIS — M9902 Segmental and somatic dysfunction of thoracic region: Secondary | ICD-10-CM | POA: Diagnosis not present

## 2019-06-11 DIAGNOSIS — M9903 Segmental and somatic dysfunction of lumbar region: Secondary | ICD-10-CM | POA: Diagnosis not present

## 2019-06-11 DIAGNOSIS — I25708 Atherosclerosis of coronary artery bypass graft(s), unspecified, with other forms of angina pectoris: Secondary | ICD-10-CM | POA: Diagnosis not present

## 2019-06-11 DIAGNOSIS — E7849 Other hyperlipidemia: Secondary | ICD-10-CM | POA: Diagnosis not present

## 2019-06-11 DIAGNOSIS — Z720 Tobacco use: Secondary | ICD-10-CM | POA: Diagnosis not present

## 2019-06-11 DIAGNOSIS — M9901 Segmental and somatic dysfunction of cervical region: Secondary | ICD-10-CM | POA: Diagnosis not present

## 2019-06-12 HISTORY — PX: EYE SURGERY: SHX253

## 2019-06-12 HISTORY — PX: CATARACT EXTRACTION: SUR2

## 2019-06-18 ENCOUNTER — Ambulatory Visit: Payer: Medicare Other | Attending: Internal Medicine

## 2019-06-18 DIAGNOSIS — M9901 Segmental and somatic dysfunction of cervical region: Secondary | ICD-10-CM | POA: Diagnosis not present

## 2019-06-18 DIAGNOSIS — M9902 Segmental and somatic dysfunction of thoracic region: Secondary | ICD-10-CM | POA: Diagnosis not present

## 2019-06-18 DIAGNOSIS — M9903 Segmental and somatic dysfunction of lumbar region: Secondary | ICD-10-CM | POA: Diagnosis not present

## 2019-06-18 DIAGNOSIS — Z23 Encounter for immunization: Secondary | ICD-10-CM

## 2019-06-18 NOTE — Progress Notes (Signed)
   Covid-19 Vaccination Clinic  Name:  Jennifer [name]    MRN: [id number] DOB: 03-13-1951  06/18/2019  Jennifer [name] was observed post Covid-19 immunization for 15 minutes without incident. She was provided with Vaccine Information Sheet and instruction to access the V-Safe system.   Jennifer [name] was instructed to call 911 with any severe reactions post vaccine: Marland Kitchen Difficulty breathing  . Swelling of face and throat  . A fast heartbeat  . A bad rash all over body  . Dizziness and weakness   Immunizations Administered    Name Date Dose VIS Date Route   Moderna COVID-19 Vaccine 06/18/2019  2:51 PM 0.5 mL 02/11/2019 Intramuscular   Manufacturer: Levan Hurst   [id number]   [id number]

## 2019-06-19 DIAGNOSIS — Z01818 Encounter for other preprocedural examination: Secondary | ICD-10-CM | POA: Diagnosis not present

## 2019-06-19 DIAGNOSIS — H2511 Age-related nuclear cataract, right eye: Secondary | ICD-10-CM | POA: Diagnosis not present

## 2019-06-19 DIAGNOSIS — H25812 Combined forms of age-related cataract, left eye: Secondary | ICD-10-CM | POA: Diagnosis not present

## 2019-06-19 DIAGNOSIS — H2512 Age-related nuclear cataract, left eye: Secondary | ICD-10-CM | POA: Diagnosis not present

## 2019-06-20 ENCOUNTER — Other Ambulatory Visit: Payer: Self-pay | Admitting: Gastroenterology

## 2019-06-20 DIAGNOSIS — H25812 Combined forms of age-related cataract, left eye: Secondary | ICD-10-CM | POA: Diagnosis not present

## 2019-06-25 DIAGNOSIS — M9903 Segmental and somatic dysfunction of lumbar region: Secondary | ICD-10-CM | POA: Diagnosis not present

## 2019-06-25 DIAGNOSIS — M9902 Segmental and somatic dysfunction of thoracic region: Secondary | ICD-10-CM | POA: Diagnosis not present

## 2019-06-25 DIAGNOSIS — M9901 Segmental and somatic dysfunction of cervical region: Secondary | ICD-10-CM | POA: Diagnosis not present

## 2019-06-26 DIAGNOSIS — I10 Essential (primary) hypertension: Secondary | ICD-10-CM | POA: Diagnosis not present

## 2019-06-26 DIAGNOSIS — Z0001 Encounter for general adult medical examination with abnormal findings: Secondary | ICD-10-CM | POA: Diagnosis not present

## 2019-06-26 DIAGNOSIS — G894 Chronic pain syndrome: Secondary | ICD-10-CM | POA: Diagnosis not present

## 2019-06-26 DIAGNOSIS — H6122 Impacted cerumen, left ear: Secondary | ICD-10-CM | POA: Diagnosis not present

## 2019-06-26 DIAGNOSIS — Z1389 Encounter for screening for other disorder: Secondary | ICD-10-CM | POA: Diagnosis not present

## 2019-07-03 DIAGNOSIS — H2511 Age-related nuclear cataract, right eye: Secondary | ICD-10-CM | POA: Diagnosis not present

## 2019-07-07 DIAGNOSIS — M9901 Segmental and somatic dysfunction of cervical region: Secondary | ICD-10-CM | POA: Diagnosis not present

## 2019-07-07 DIAGNOSIS — M9902 Segmental and somatic dysfunction of thoracic region: Secondary | ICD-10-CM | POA: Diagnosis not present

## 2019-07-07 DIAGNOSIS — M9903 Segmental and somatic dysfunction of lumbar region: Secondary | ICD-10-CM | POA: Diagnosis not present

## 2019-07-11 DIAGNOSIS — I25708 Atherosclerosis of coronary artery bypass graft(s), unspecified, with other forms of angina pectoris: Secondary | ICD-10-CM | POA: Diagnosis not present

## 2019-07-11 DIAGNOSIS — Z720 Tobacco use: Secondary | ICD-10-CM | POA: Diagnosis not present

## 2019-07-11 DIAGNOSIS — E7849 Other hyperlipidemia: Secondary | ICD-10-CM | POA: Diagnosis not present

## 2019-07-14 DIAGNOSIS — M9903 Segmental and somatic dysfunction of lumbar region: Secondary | ICD-10-CM | POA: Diagnosis not present

## 2019-07-14 DIAGNOSIS — M9902 Segmental and somatic dysfunction of thoracic region: Secondary | ICD-10-CM | POA: Diagnosis not present

## 2019-07-14 DIAGNOSIS — M9901 Segmental and somatic dysfunction of cervical region: Secondary | ICD-10-CM | POA: Diagnosis not present

## 2019-07-22 DIAGNOSIS — M9901 Segmental and somatic dysfunction of cervical region: Secondary | ICD-10-CM | POA: Diagnosis not present

## 2019-07-22 DIAGNOSIS — M9903 Segmental and somatic dysfunction of lumbar region: Secondary | ICD-10-CM | POA: Diagnosis not present

## 2019-07-22 DIAGNOSIS — M9902 Segmental and somatic dysfunction of thoracic region: Secondary | ICD-10-CM | POA: Diagnosis not present

## 2019-07-24 DIAGNOSIS — I10 Essential (primary) hypertension: Secondary | ICD-10-CM | POA: Diagnosis not present

## 2019-07-24 DIAGNOSIS — G894 Chronic pain syndrome: Secondary | ICD-10-CM | POA: Diagnosis not present

## 2019-07-29 DIAGNOSIS — M9901 Segmental and somatic dysfunction of cervical region: Secondary | ICD-10-CM | POA: Diagnosis not present

## 2019-07-29 DIAGNOSIS — M9903 Segmental and somatic dysfunction of lumbar region: Secondary | ICD-10-CM | POA: Diagnosis not present

## 2019-07-29 DIAGNOSIS — M9902 Segmental and somatic dysfunction of thoracic region: Secondary | ICD-10-CM | POA: Diagnosis not present

## 2019-08-05 DIAGNOSIS — M9902 Segmental and somatic dysfunction of thoracic region: Secondary | ICD-10-CM | POA: Diagnosis not present

## 2019-08-05 DIAGNOSIS — M9901 Segmental and somatic dysfunction of cervical region: Secondary | ICD-10-CM | POA: Diagnosis not present

## 2019-08-05 DIAGNOSIS — M9903 Segmental and somatic dysfunction of lumbar region: Secondary | ICD-10-CM | POA: Diagnosis not present

## 2019-08-12 DIAGNOSIS — M9903 Segmental and somatic dysfunction of lumbar region: Secondary | ICD-10-CM | POA: Diagnosis not present

## 2019-08-12 DIAGNOSIS — M9901 Segmental and somatic dysfunction of cervical region: Secondary | ICD-10-CM | POA: Diagnosis not present

## 2019-08-12 DIAGNOSIS — M9902 Segmental and somatic dysfunction of thoracic region: Secondary | ICD-10-CM | POA: Diagnosis not present

## 2019-08-19 DIAGNOSIS — M9901 Segmental and somatic dysfunction of cervical region: Secondary | ICD-10-CM | POA: Diagnosis not present

## 2019-08-19 DIAGNOSIS — M9902 Segmental and somatic dysfunction of thoracic region: Secondary | ICD-10-CM | POA: Diagnosis not present

## 2019-08-19 DIAGNOSIS — M9903 Segmental and somatic dysfunction of lumbar region: Secondary | ICD-10-CM | POA: Diagnosis not present

## 2019-08-22 DIAGNOSIS — E7849 Other hyperlipidemia: Secondary | ICD-10-CM | POA: Diagnosis not present

## 2019-08-22 DIAGNOSIS — I10 Essential (primary) hypertension: Secondary | ICD-10-CM | POA: Diagnosis not present

## 2019-08-22 DIAGNOSIS — M545 Low back pain: Secondary | ICD-10-CM | POA: Diagnosis not present

## 2019-08-26 DIAGNOSIS — M9901 Segmental and somatic dysfunction of cervical region: Secondary | ICD-10-CM | POA: Diagnosis not present

## 2019-08-26 DIAGNOSIS — M9903 Segmental and somatic dysfunction of lumbar region: Secondary | ICD-10-CM | POA: Diagnosis not present

## 2019-08-26 DIAGNOSIS — M9902 Segmental and somatic dysfunction of thoracic region: Secondary | ICD-10-CM | POA: Diagnosis not present

## 2019-08-30 DIAGNOSIS — H04123 Dry eye syndrome of bilateral lacrimal glands: Secondary | ICD-10-CM | POA: Diagnosis not present

## 2019-09-02 DIAGNOSIS — M9902 Segmental and somatic dysfunction of thoracic region: Secondary | ICD-10-CM | POA: Diagnosis not present

## 2019-09-02 DIAGNOSIS — M9901 Segmental and somatic dysfunction of cervical region: Secondary | ICD-10-CM | POA: Diagnosis not present

## 2019-09-02 DIAGNOSIS — M9903 Segmental and somatic dysfunction of lumbar region: Secondary | ICD-10-CM | POA: Diagnosis not present

## 2019-09-25 DIAGNOSIS — I10 Essential (primary) hypertension: Secondary | ICD-10-CM | POA: Diagnosis not present

## 2019-09-25 DIAGNOSIS — G894 Chronic pain syndrome: Secondary | ICD-10-CM | POA: Diagnosis not present

## 2019-09-25 DIAGNOSIS — Z23 Encounter for immunization: Secondary | ICD-10-CM | POA: Diagnosis not present

## 2019-09-25 DIAGNOSIS — I509 Heart failure, unspecified: Secondary | ICD-10-CM | POA: Diagnosis not present

## 2019-09-25 DIAGNOSIS — E7849 Other hyperlipidemia: Secondary | ICD-10-CM | POA: Diagnosis not present

## 2019-09-25 DIAGNOSIS — Z951 Presence of aortocoronary bypass graft: Secondary | ICD-10-CM | POA: Diagnosis not present

## 2019-10-10 DIAGNOSIS — Z720 Tobacco use: Secondary | ICD-10-CM | POA: Diagnosis not present

## 2019-10-10 DIAGNOSIS — I10 Essential (primary) hypertension: Secondary | ICD-10-CM | POA: Diagnosis not present

## 2019-10-10 DIAGNOSIS — I25708 Atherosclerosis of coronary artery bypass graft(s), unspecified, with other forms of angina pectoris: Secondary | ICD-10-CM | POA: Diagnosis not present

## 2019-10-10 DIAGNOSIS — G894 Chronic pain syndrome: Secondary | ICD-10-CM | POA: Diagnosis not present

## 2019-10-24 DIAGNOSIS — G894 Chronic pain syndrome: Secondary | ICD-10-CM | POA: Diagnosis not present

## 2019-10-24 DIAGNOSIS — I10 Essential (primary) hypertension: Secondary | ICD-10-CM | POA: Diagnosis not present

## 2019-10-24 DIAGNOSIS — Z86718 Personal history of other venous thrombosis and embolism: Secondary | ICD-10-CM | POA: Diagnosis not present

## 2019-11-12 DIAGNOSIS — H2513 Age-related nuclear cataract, bilateral: Secondary | ICD-10-CM | POA: Diagnosis not present

## 2019-11-12 DIAGNOSIS — H40033 Anatomical narrow angle, bilateral: Secondary | ICD-10-CM | POA: Diagnosis not present

## 2019-11-14 DIAGNOSIS — G894 Chronic pain syndrome: Secondary | ICD-10-CM | POA: Diagnosis not present

## 2019-11-14 DIAGNOSIS — I10 Essential (primary) hypertension: Secondary | ICD-10-CM | POA: Diagnosis not present

## 2019-11-14 DIAGNOSIS — I509 Heart failure, unspecified: Secondary | ICD-10-CM | POA: Diagnosis not present

## 2019-12-04 DIAGNOSIS — H04123 Dry eye syndrome of bilateral lacrimal glands: Secondary | ICD-10-CM | POA: Diagnosis not present

## 2019-12-11 DIAGNOSIS — I25708 Atherosclerosis of coronary artery bypass graft(s), unspecified, with other forms of angina pectoris: Secondary | ICD-10-CM | POA: Diagnosis not present

## 2019-12-11 DIAGNOSIS — G894 Chronic pain syndrome: Secondary | ICD-10-CM | POA: Diagnosis not present

## 2019-12-12 DIAGNOSIS — I10 Essential (primary) hypertension: Secondary | ICD-10-CM | POA: Diagnosis not present

## 2019-12-12 DIAGNOSIS — E7849 Other hyperlipidemia: Secondary | ICD-10-CM | POA: Diagnosis not present

## 2019-12-12 DIAGNOSIS — Z86718 Personal history of other venous thrombosis and embolism: Secondary | ICD-10-CM | POA: Diagnosis not present

## 2019-12-12 DIAGNOSIS — G894 Chronic pain syndrome: Secondary | ICD-10-CM | POA: Diagnosis not present

## 2019-12-19 ENCOUNTER — Other Ambulatory Visit: Payer: Self-pay

## 2019-12-19 ENCOUNTER — Encounter (INDEPENDENT_AMBULATORY_CARE_PROVIDER_SITE_OTHER): Payer: Self-pay | Admitting: Ophthalmology

## 2019-12-19 ENCOUNTER — Ambulatory Visit (INDEPENDENT_AMBULATORY_CARE_PROVIDER_SITE_OTHER): Payer: Medicare Other | Admitting: Ophthalmology

## 2019-12-19 DIAGNOSIS — H3581 Retinal edema: Secondary | ICD-10-CM | POA: Diagnosis not present

## 2019-12-19 DIAGNOSIS — H35033 Hypertensive retinopathy, bilateral: Secondary | ICD-10-CM | POA: Diagnosis not present

## 2019-12-19 DIAGNOSIS — H43811 Vitreous degeneration, right eye: Secondary | ICD-10-CM

## 2019-12-19 DIAGNOSIS — I10 Essential (primary) hypertension: Secondary | ICD-10-CM | POA: Diagnosis not present

## 2019-12-19 DIAGNOSIS — Z961 Presence of intraocular lens: Secondary | ICD-10-CM

## 2019-12-19 NOTE — Progress Notes (Signed)
[facility] Note  12/19/2019     CHIEF COMPLAINT Patient presents for Retina Evaluation   HISTORY OF PRESENT ILLNESS: Jennifer [name] is a 69 y.o. female who presents to the clinic today for:   HPI    Retina Evaluation    In right eye.  This started 1 day ago.  Duration of 1 day.  Associated Symptoms Flashes, Floaters, Glare and Pain.  I, the attending physician,  performed the HPI with the patient and updated documentation appropriately.          Comments    Patient here for retina Evaluation. Referred by Dr. Anthony Sar. Patient states vision doing better than yesterday. Like having a headache behind OD. Saw like pin pricks of light. Like a light coating of mud. Could see out bottom of eye not the top. This am a lot clearer. Put on Prednisone drops QID OD. Used 2 times yesterday and used this am.        Last edited by Bernarda Caffey, MD on 12/19/2019 12:54 PM. (History)    pt is here on the referral of Dr. Anthony Sar for concern of fol and floaters, she states she was sitting on her porch yesterday and suddenly "lost vision" in her right eye, she states she started to see fol and splotchy floaters as well as a "grey pudding" clouding her vision, she states it started to clear up a little yesterday afternoon, pt has had cataract sx with Dr. Alanda Slim in April  Referring physician: Harlen Labs, MD [id number] [place],  [place] [address]  HISTORICAL INFORMATION:   Selected notes from the MEDICAL RECORD NUMBER Referred by Dr. Truman Hayward:  Ocular Hx- PMH-    CURRENT MEDICATIONS: No current outpatient medications on file. (Ophthalmic Drugs)   No current facility-administered medications for this visit. (Ophthalmic Drugs)   Current Outpatient Medications (Other)  Medication Sig  . allopurinol (ZYLOPRIM) 300 MG tablet Take 300 mg by mouth daily.  Marland Kitchen aspirin EC 81 MG EC tablet Take 1 tablet (81 mg total) by mouth daily.  Marland Kitchen atorvastatin (LIPITOR) 20 MG tablet   .  calcium carbonate (TUMS - DOSED IN MG ELEMENTAL CALCIUM) 500 MG chewable tablet Chew 2 tablets by mouth 3 (three) times daily with meals.   . carvedilol (COREG) 3.125 MG tablet TAKE (1) TABLET BY MOUTH TWICE DAILY.  Marland Kitchen Cholecalciferol (VITAMIN D) 2000 units CAPS Take 2,000 Units by mouth daily.  . DULoxetine (CYMBALTA) 30 MG capsule Take 30 mg by mouth daily.  Marland Kitchen FeFum-FePoly-FA-B Cmp-C-Biot (INTEGRA PLUS) CAPS Take 1 capsule daily with a meal  . gabapentin (NEURONTIN) 600 MG tablet Take 300-600 mg by mouth See admin instructions. Take 300 mg twice daily IN THE MORNING AND AT NOON AND  600 mg at bedtime.  Marland Kitchen lisinopril (ZESTRIL) 40 MG tablet Take 1 tablet (40 mg total) by mouth daily.  . Omega-3 Fatty Acids (FISH OIL) 1360 MG CAPS Take by mouth.  . Oxycodone HCl 10 MG TABS Take 10 mg by mouth 3 (three) times daily as needed. For pain  . rivaroxaban (XARELTO) 20 MG TABS tablet Take 20 mg by mouth daily with supper.   No current facility-administered medications for this visit. (Other)      REVIEW OF SYSTEMS: ROS    Positive for: Cardiovascular, Eyes   Last edited by Theodore Demark, COA on 12/19/2019  8:20 AM. (History)       ALLERGIES Allergies  Allergen  Reactions  . Bee Venom Anaphylaxis    PAST MEDICAL HISTORY Past Medical History:  Diagnosis Date  . Anemia   . Anxiety   . Arthritis    pt. denies  . Bell's palsy   . CAD (coronary artery disease)    a. Cath 12/2014 - mild-moderate non-obstructive CAD with 60% mid RCA, 50% mid Cx-distal Cx, 30% distal LAD, 25% D2, EF 55-65%. b. 02/2017: NSTEMI with cath showing 60% LM stenosis and 100% RCA stenosis --> CT Surgery consulted with CABG on 02/21/2017 (LIMA-LAD, SVG-OM, and SVG-PDA)  . Depression   . Dizziness   . DVT (deep venous thrombosis) (HCC)    on Xarelto  . Dyspnea    W/ EXERTION pt. denies  . GERD (gastroesophageal reflux disease)   . Heart murmur    DX   AT BIRTH  . Hyperlipidemia   . Hypertension   . 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 Myocardial infarction ([place])   . Neuropathy   . PE (pulmonary embolism)   . Tobacco abuse    Past Surgical History:  Procedure Laterality Date  . ABDOMINAL HYSTERECTOMY  1974  . CARDIAC CATHETERIZATION N/A 01/08/2015   Procedure: Left Heart Cath and Coronary Angiography;  Surgeon: Jolaine Artist, MD;  Location: [facility];  Service: Cardiovascular;  Laterality: N/A;  . CAROTID ARTERY ANGIOPLASTY     stent right side  . CARPAL TUNNEL RELEASE     RIGHT  . COLONOSCOPY WITH PROPOFOL N/A 02/16/2017   Procedure: COLONOSCOPY WITH PROPOFOL;  Surgeon: [name], MD;  Location: [facility];  Service: Endoscopy;  Laterality: N/A;  . CORONARY ARTERY BYPASS GRAFT N/A 02/21/2017   Procedure: CORONARY ARTERY BYPASS GRAFTING (CABG) times three. LIMA to LAD, SVG to OM and SVG to PDA;  Surgeon: Melrose Nakayama, MD;  Location: [facility];  Service: Open Heart Surgery;  Laterality: N/A;  . CORONARY STENT PLACEMENT Right 1980  . ENTEROSCOPY N/A 06/14/2017   Procedure: ENTEROSCOPY;  Surgeon: [name], MD;  Location: [facility] ENDOS[facility];  Service: Endoscopy;  Laterality: N/A;  . ESOPHAGOGASTRODUODENOSCOPY (EGD) WITH PROPOFOL N/A 02/16/2017   Procedure: ESOPHAGOGASTRODUODENOSCOPY (EGD) WITH PROPOFOL;  Surgeon: [name], MD;  Location: [facility] ENDOSCOPY;  Service: Endoscopy;  Laterality: N/A;  . GIVENS CAPSULE STUDY N/A 05/08/2017   Procedure: GIVENS CAPSULE STUDY;  Surgeon: [name], MD;  Location: [facility];  Service: Endoscopy;  Laterality: N/A;  . KNEE ARTHROSCOPY W/ MENISCAL REPAIR     LEFT KNEE   FORMED DVT (SURG TO REMOVE BLOOD CLOT)  . LEFT HEART CATH AND CORONARY ANGIOGRAPHY N/A 02/19/2017   Procedure: LEFT HEART CATH AND CORONARY ANGIOGRAPHY;  Surgeon: Nelva Bush, MD;  Location: [facility];  Service: Cardiovascular;  Laterality: N/A;  . TEE WITHOUT CARDIOVERSION N/A 02/21/2017   Procedure: TRANSESOPHAGEAL  ECHOCARDIOGRAM (TEE);  Surgeon: Melrose Nakayama, MD;  Location: [facility];  Service: Open Heart Surgery;  Laterality: N/A;  . TONSILLECTOMY      FAMILY HISTORY Family History  Problem Relation Age of Onset  . Heart attack Mother   . AAA (abdominal aortic aneurysm) Mother   . Heart attack Father   . Heart disease Father   . Heart attack Brother        CABG  . Heart disease Brother   . Heart attack Brother        CABG  . Heart attack Brother  CABG  . Ovarian cancer Maternal Grandmother   . Heart disease Maternal Grandfather   . Dementia Paternal Grandmother     SOCIAL HISTORY Social History   Tobacco Use  . Smoking status: Current Every Day Smoker    Packs/day: 0.25    Years: 50.00    Pack years: 12.50    Types: Cigarettes    Start date: 06/18/1967  . Smokeless tobacco: Never Used  . Tobacco comment: 3-4 a day  Vaping Use  . Vaping Use: Never used  Substance Use Topics  . Alcohol use: No    Alcohol/week: 0.0 standard drinks  . Drug use: No         OPHTHALMIC EXAM:  Base Eye Exam    Visual Acuity (Snellen - Linear)      Right Left   Dist [place] 20/40 -1 20/20 -2   Dist ph [place] 20/30 +1        Tonometry (Tonopen, 8:13 AM)      Right Left   Pressure 12 12       Pupils      Dark Light Shape React APD   Right 2 1 Round Minimal None   Left 2 1 Round Minimal None       Visual Fields (Counting fingers)      Left Right    Full Full       Extraocular Movement      Right Left    Full, Ortho Full, Ortho       Neuro/Psych    Oriented x3: Yes   Mood/Affect: Normal       Dilation    Both eyes: 1.0% Mydriacyl, 2.5% Phenylephrine @ 8:13 AM        Slit Lamp and Fundus Exam    Slit Lamp Exam      Right Left   Lids/Lashes Dermatochalasis - upper lid Dermatochalasis - upper lid   Conjunctiva/Sclera White and quiet Mild temporal pinguecula   Cornea arcus, well healed temporal cataract wounds, trace Punctate epithelial erosions arcus, well healed  temporal cataract wounds, trace Punctate epithelial erosions, mild Debris in tear film   Anterior Chamber Deep and quiet Deep and quiet   Iris Round and moderately dilated Round and moderately dilated   Lens PC IOL in good position PC IOL in good position   Vitreous Vitreous syneresis, mild vitreous condensations Vitreous syneresis       Fundus Exam      Right Left   Disc Pink and Sharp Pink and Sharp   C/D Ratio 0.5 0.55   Macula Flat, Good foveal reflex, trace ERM Flat, blunted foveal reflex, mild RPE mottling, No heme or edema   Vessels attenuated, mild tortuousity attenuated, mild tortuousity   Periphery Attached; no RT/RD; no heme Attached           Refraction    Manifest Refraction (Auto)      Sphere Cylinder Axis Dist VA   Right -0.25 +0.25 113 20/25-2   Left -0.50   20/20-2          IMAGING AND PROCEDURES  Imaging and Procedures for 12/19/2019  OCT, Retina - OU - Both Eyes       Right Eye Quality was good. Central Foveal Thickness: 222. Progression has no prior data. Findings include normal foveal contour, no IRF, no SRF (Partial PVD).   Left Eye Quality was good. Central Foveal Thickness: 223. Progression has no prior data. Findings include normal foveal contour, no IRF, no SRF, vitreomacular  adhesion .   Notes *Images captured and stored on drive  Diagnosis / Impression:  NFP, no IRF/SRF OU OD: partial PVD  Clinical management:  See below  Abbreviations: NFP - Normal foveal profile. CME - cystoid macular edema. PED - pigment epithelial detachment. IRF - intraretinal fluid. SRF - subretinal fluid. EZ - ellipsoid zone. ERM - epiretinal membrane. ORA - outer retinal atrophy. ORT - outer retinal tubulation. SRHM - subretinal hyper-reflective material. IRHM - intraretinal hyper-reflective material                 ASSESSMENT/PLAN:    ICD-10-CM   1. Posterior vitreous detachment of right eye  H43.811   2. Retinal edema  H35.81 OCT, Retina - OU - Both  Eyes  3. Essential hypertension  I10   4. Hypertensive retinopathy of both eyes  H35.033   5. Pseudophakia, both eyes  Z96.1     1,2. Partial PVD / vitreous syneresis OD  - symptomatic flashes/floaters x1 day w/ transient decrease in vision -- improved today  - Discussed findings and prognosis  - No RT or RD on 360 scleral depressed exam  - Reviewed s/s of RT/RD  - Strict return precautions for any such RT/RD signs/symptoms  - f/u in 4-6 wks, sooner prn -- DFE/OCT  3,4. Hypertensive retinopathy OU - discussed importance of tight BP control - monitor  5. Pseudophakia OU  - s/p CE/IOL (Dr. Alanda Slim, 06/2019)  - IOL in good position, doing well  - monitor   Ophthalmic Meds Ordered this visit:  No orders of the defined types were placed in this encounter.      Return for f/u 4-6 weeks, PVD OD, DFE, OCT.  There are no Patient Instructions on file for this visit.   Explained the diagnoses, plan, and follow up with the patient and they expressed understanding.  Patient expressed understanding of the importance of proper follow up care.   This document serves as a record of services personally performed by Gardiner Sleeper, MD, PhD. It was created on their behalf by San Jetty. Owens Shark, OA an ophthalmic technician. The creation of this record is the provider's dictation and/or activities during the visit.    Electronically signed by: San Jetty. Owens Shark, [place] 10.08.2021 12:57 PM   Gardiner Sleeper, M.D., Ph.D. Diseases & Surgery of the Retina and Vitreous [facility]  I have reviewed the above documentation for accuracy and completeness, and I agree with the above. Gardiner Sleeper, M.D., Ph.D. 12/19/19 12:57 PM   Abbreviations: M myopia (nearsighted); A astigmatism; H hyperopia (farsighted); P presbyopia; Mrx spectacle prescription;  CTL contact lenses; OD right eye; OS left eye; OU both eyes  XT exotropia; ET esotropia; PEK punctate epithelial keratitis; PEE  punctate epithelial erosions; DES dry eye syndrome; MGD meibomian gland dysfunction; ATs artificial tears; PFAT's preservative free artificial tears; [place] nuclear sclerotic cataract; PSC posterior subcapsular cataract; ERM epi-retinal membrane; PVD posterior vitreous detachment; RD retinal detachment; DM diabetes mellitus; DR diabetic retinopathy; NPDR non-proliferative diabetic retinopathy; PDR proliferative diabetic retinopathy; CSME clinically significant macular edema; DME diabetic macular edema; dbh dot blot hemorrhages; CWS cotton wool spot; POAG primary open angle glaucoma; C/D cup-to-disc ratio; HVF humphrey visual field; GVF goldmann visual field; OCT optical coherence tomography; IOP intraocular pressure; BRVO Branch retinal vein occlusion; CRVO central retinal vein occlusion; CRAO central retinal artery occlusion; BRAO branch retinal artery occlusion; RT retinal tear; SB scleral buckle; PPV pars plana vitrectomy; VH Vitreous hemorrhage; PRP panretinal  laser photocoagulation; IVK intravitreal kenalog; VMT vitreomacular traction; MH Macular hole;  NVD neovascularization of the disc; NVE neovascularization elsewhere; AREDS age related eye disease study; ARMD age related macular degeneration; POAG primary open angle glaucoma; EBMD epithelial/anterior basement membrane dystrophy; ACIOL anterior chamber intraocular lens; IOL intraocular lens; PCIOL posterior chamber intraocular lens; Phaco/IOL phacoemulsification with intraocular lens placement; [place] photorefractive keratectomy; LASIK laser assisted in situ keratomileusis; HTN hypertension; DM diabetes mellitus; COPD chronic obstructive pulmonary disease

## 2019-12-25 ENCOUNTER — Other Ambulatory Visit: Payer: Self-pay | Admitting: Cardiology

## 2020-01-10 DIAGNOSIS — Z720 Tobacco use: Secondary | ICD-10-CM | POA: Diagnosis not present

## 2020-01-10 DIAGNOSIS — G894 Chronic pain syndrome: Secondary | ICD-10-CM | POA: Diagnosis not present

## 2020-01-21 DIAGNOSIS — Z86718 Personal history of other venous thrombosis and embolism: Secondary | ICD-10-CM | POA: Diagnosis not present

## 2020-01-21 DIAGNOSIS — Z23 Encounter for immunization: Secondary | ICD-10-CM | POA: Diagnosis not present

## 2020-01-21 DIAGNOSIS — I10 Essential (primary) hypertension: Secondary | ICD-10-CM | POA: Diagnosis not present

## 2020-01-21 DIAGNOSIS — G894 Chronic pain syndrome: Secondary | ICD-10-CM | POA: Diagnosis not present

## 2020-01-21 DIAGNOSIS — E7849 Other hyperlipidemia: Secondary | ICD-10-CM | POA: Diagnosis not present

## 2020-01-23 ENCOUNTER — Encounter (INDEPENDENT_AMBULATORY_CARE_PROVIDER_SITE_OTHER): Payer: Medicare Other | Admitting: Ophthalmology

## 2020-01-23 DIAGNOSIS — I10 Essential (primary) hypertension: Secondary | ICD-10-CM

## 2020-01-23 DIAGNOSIS — H35033 Hypertensive retinopathy, bilateral: Secondary | ICD-10-CM

## 2020-01-23 DIAGNOSIS — H3581 Retinal edema: Secondary | ICD-10-CM

## 2020-01-23 DIAGNOSIS — H43811 Vitreous degeneration, right eye: Secondary | ICD-10-CM

## 2020-01-23 DIAGNOSIS — Z961 Presence of intraocular lens: Secondary | ICD-10-CM

## 2020-01-28 NOTE — Progress Notes (Signed)
[facility] Note  01/30/2020     CHIEF COMPLAINT Patient presents for Retina Follow Up   HISTORY OF PRESENT ILLNESS: Jennifer [name] is a 69 y.o. female who presents to the clinic today for:   HPI    Retina Follow Up    Patient presents with  PVD.  In right eye.  This started 6 weeks ago.  Severity is mild.  Duration of 6 weeks.  Since onset it is gradually improving.  I, the attending physician,  performed the HPI with the patient and updated documentation appropriately.          Comments    69 y/o female pt here for 6 wk f/u for PVD OD.  No change in [place] OU noticed.  Denies pain, FOL, new floaters.  No gtts.       Last edited by Bernarda Caffey, MD on 01/30/2020  1:16 PM. (History)    pt states her symptoms are improving from last visit, she states every once in awhile she still sees a small floater, no fol  Referring physician: Harlen Labs, MD [phone] [facility] [address] [place],  [place] [address]  HISTORICAL INFORMATION:   Selected notes from the MEDICAL RECORD NUMBER Referred by Dr. Anthony Sar LEE:  Ocular Hx- PMH-    CURRENT MEDICATIONS: No current outpatient medications on file. (Ophthalmic Drugs)   No current facility-administered medications for this visit. (Ophthalmic Drugs)   Current Outpatient Medications (Other)  Medication Sig  . allopurinol (ZYLOPRIM) 300 MG tablet Take 300 mg by mouth daily.  Marland Kitchen amLODipine-olmesartan (AZOR) 5-40 MG tablet Take 1 tablet by mouth daily.  . APO-VARENICLINE 1 MG tablet   . aspirin EC 81 MG EC tablet Take 1 tablet (81 mg total) by mouth daily.  Marland Kitchen atorvastatin (LIPITOR) 20 MG tablet   . atorvastatin (LIPITOR) 40 MG tablet TAKE ONE TABLET BY MOUTHAONCE DAILY.  . calcium carbonate (TUMS - DOSED IN MG ELEMENTAL CALCIUM) 500 MG chewable tablet Chew 2 tablets by mouth 3 (three) times daily with meals.   . carvedilol (COREG) 3.125 MG tablet TAKE (1) TABLET BY MOUTH TWICE DAILY.  Marland Kitchen Cholecalciferol (VITAMIN D) 2000 units  CAPS Take 2,000 Units by mouth daily.  . clobetasol ointment (TEMOVATE) 0.05 % SMARTSIG:Sparingly Topical Twice Daily  . DULoxetine (CYMBALTA) 20 MG capsule Take 20 mg by mouth 2 (two) times daily.  . DULoxetine (CYMBALTA) 30 MG capsule Take 30 mg by mouth daily.  Marland Kitchen FeFum-FePoly-FA-B Cmp-C-Biot (INTEGRA PLUS) CAPS Take 1 capsule daily with a meal  . gabapentin (NEURONTIN) 600 MG tablet Take 300-600 mg by mouth See admin instructions. Take 300 mg twice daily IN THE MORNING AND AT NOON AND  600 mg at bedtime.  Marland Kitchen lisinopril (ZESTRIL) 40 MG tablet Take 1 tablet (40 mg total) by mouth daily.  . Omega-3 Fatty Acids (FISH OIL) 1360 MG CAPS Take by mouth.  . Oxycodone HCl 10 MG TABS Take 10 mg by mouth 3 (three) times daily as needed. For pain  . rivaroxaban (XARELTO) 20 MG TABS tablet Take 20 mg by mouth daily with supper.   No current facility-administered medications for this visit. (Other)      REVIEW OF SYSTEMS: ROS    Positive for: Cardiovascular, Eyes   Negative for: Constitutional, Gastrointestinal, Neurological, Skin, Genitourinary, Musculoskeletal, HENT, Endocrine, Respiratory, Psychiatric, Allergic/Imm, Heme/Lymph   Last edited by Matthew Folks, COA on 01/30/2020  8:32 AM. (History)  ALLERGIES Allergies  Allergen Reactions  . Bee Venom Anaphylaxis    PAST MEDICAL HISTORY Past Medical History:  Diagnosis Date  . Anemia   . Anxiety   . Arthritis    pt. denies  . Bell's palsy   . CAD (coronary artery disease)    a. Cath 12/2014 - mild-moderate non-obstructive CAD with 60% mid RCA, 50% mid Cx-distal Cx, 30% distal LAD, 25% D2, EF 55-65%. b. 02/2017: NSTEMI with cath showing 60% LM stenosis and 100% RCA stenosis --> CT Surgery consulted with CABG on 02/21/2017 (LIMA-LAD, SVG-OM, and SVG-PDA)  . Depression   . Dizziness   . DVT (deep venous thrombosis) (HCC)    on Xarelto  . Dyspnea    W/ EXERTION pt. denies  . GERD (gastroesophageal reflux disease)   . Heart  murmur    DX   AT BIRTH  . Hyperlipidemia   . Hypertension   . Hypertensive retinopathy    OU  . 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 Myocardial infarction ([place])   . Neuropathy   . PE (pulmonary embolism)   . Tobacco abuse    Past Surgical History:  Procedure Laterality Date  . ABDOMINAL HYSTERECTOMY  1974  . CARDIAC CATHETERIZATION N/A 01/08/2015   Procedure: Left Heart Cath and Coronary Angiography;  Surgeon: Jolaine Artist, MD;  Location: [facility];  Service: Cardiovascular;  Laterality: N/A;  . CAROTID ARTERY ANGIOPLASTY     stent right side  . CARPAL TUNNEL RELEASE     RIGHT  . CATARACT EXTRACTION Bilateral 06/2019   Dr. Alanda Slim  . COLONOSCOPY WITH PROPOFOL N/A 02/16/2017   Procedure: COLONOSCOPY WITH PROPOFOL;  Surgeon: [name], MD;  Location: [facility];  Service: Endoscopy;  Laterality: N/A;  . CORONARY ARTERY BYPASS GRAFT N/A 02/21/2017   Procedure: CORONARY ARTERY BYPASS GRAFTING (CABG) times three. LIMA to LAD, SVG to OM and SVG to PDA;  Surgeon: Melrose Nakayama, MD;  Location: [facility];  Service: Open Heart Surgery;  Laterality: N/A;  . CORONARY STENT PLACEMENT Right 1980  . ENTEROSCOPY N/A 06/14/2017   Procedure: ENTEROSCOPY;  Surgeon: [name], MD;  Location: [facility];  Service: Endoscopy;  Laterality: N/A;  . ESOPHAGOGASTRODUODENOSCOPY (EGD) WITH PROPOFOL N/A 02/16/2017   Procedure: ESOPHAGOGASTRODUODENOSCOPY (EGD) WITH PROPOFOL;  Surgeon: [name], MD;  [facility];  Service: Endoscopy;  Laterality: N/A;  . EYE SURGERY Bilateral 06/2019   Cat Sx - Dr. Alanda Slim  . GIVENS CAPSULE STUDY N/A 05/08/2017   Procedure: GIVENS CAPSULE STUDY;  Surgeon: [name], MD;  Location: [facility] ENDOSCOPY;  Service: Endoscopy;  Laterality: N/A;  . KNEE ARTHROSCOPY W/ MENISCAL REPAIR     LEFT KNEE   FORMED DVT (SURG TO REMOVE BLOOD CLOT)  . LEFT HEART CATH AND CORONARY ANGIOGRAPHY N/A 02/19/2017   Procedure: LEFT HEART  CATH AND CORONARY ANGIOGRAPHY;  Surgeon: Nelva Bush, MD;  Location: [place];  Service: Cardiovascular;  Laterality: N/A;  . TEE WITHOUT CARDIOVERSION N/A 02/21/2017   Procedure: TRANSESOPHAGEAL ECHOCARDIOGRAM (TEE);  Surgeon: Melrose Nakayama, MD;  Location: [facility];  Service: Open Heart Surgery;  Laterality: N/A;  . TONSILLECTOMY      FAMILY HISTORY Family History  Problem Relation Age of Onset  . Heart attack Mother   . AAA (abdominal aortic aneurysm) Mother   . Heart attack Father   . Heart disease Father   . Heart attack Brother  CABG  . Heart disease Brother   . Heart attack Brother        CABG  . Heart attack Brother        CABG  . Ovarian cancer Maternal Grandmother   . Heart disease Maternal Grandfather   . Dementia Paternal Grandmother     SOCIAL HISTORY Social History   Tobacco Use  . Smoking status: Current Every Day Smoker    Packs/day: 0.25    Years: 50.00    Pack years: 12.50    Types: Cigarettes    Start date: 06/18/1967  . Smokeless tobacco: Never Used  . Tobacco comment: 3-4 a day  Vaping Use  . Vaping Use: Never used  Substance Use Topics  . Alcohol use: No    Alcohol/week: 0.0 standard drinks  . Drug use: No         OPHTHALMIC EXAM:  Base Eye Exam    Visual Acuity (Snellen - Linear)      Right Left   Dist [place] 20/30 20/20   Dist ph [place] 20/25        Tonometry (Tonopen, 8:38 AM)      Right Left   Pressure 14 15       Pupils      Dark Light Shape React APD   Right 2 1 Round Minimal None   Left 2 1 Round Minimal None       Visual Fields (Counting fingers)      Left Right    Full Full       Extraocular Movement      Right Left    Full, Ortho Full, Ortho       Neuro/Psych    Oriented x3: Yes   Mood/Affect: Normal       Dilation    Both eyes: 1.0% Mydriacyl, 2.5% Phenylephrine @ 8:38 AM        Slit Lamp and Fundus Exam    Slit Lamp Exam      Right Left   Lids/Lashes Dermatochalasis - upper lid  Dermatochalasis - upper lid   Conjunctiva/Sclera mild nasal pinguecula Mild temporal pinguecula   Cornea arcus, well healed temporal cataract wounds, 1+Punctate epithelial erosions arcus, well healed temporal cataract wounds, 1+Punctate epithelial erosions   Anterior Chamber Deep and quiet Deep and quiet   Iris Round and moderately dilated Round and moderately dilated   Lens PC IOL in good position PC IOL in good position   Vitreous Vitreous syneresis, mild vitreous condensations Vitreous syneresis       Fundus Exam      Right Left   Disc Pink and Sharp Pink and Sharp   C/D Ratio 0.6 0.55   Macula Flat, Good foveal reflex, RPE mottling, mild ERM Flat, blunted foveal reflex, mild RPE mottling, No heme or edema   Vessels attenuated, mild tortuousity attenuated, mild tortuousity   Periphery Attached; no RT/RD; no heme Attached             IMAGING AND PROCEDURES  Imaging and Procedures for 01/30/2020  OCT, Retina - OU - Both Eyes       Right Eye Quality was good. Central Foveal Thickness: 215. Progression has been stable. Findings include normal foveal contour, no IRF, no SRF (Partial PVD).   Left Eye Quality was good. Central Foveal Thickness: 223. Progression has been stable. Findings include normal foveal contour, no IRF, no SRF, vitreomacular adhesion .   Notes *Images captured and stored on drive  Diagnosis / Impression:  NFP, no IRF/SRF OU OD: partial PVD  Clinical management:  See below  Abbreviations: NFP - Normal foveal profile. CME - cystoid macular edema. PED - pigment epithelial detachment. IRF - intraretinal fluid. SRF - subretinal fluid. EZ - ellipsoid zone. ERM - epiretinal membrane. ORA - outer retinal atrophy. ORT - outer retinal tubulation. SRHM - subretinal hyper-reflective material. IRHM - intraretinal hyper-reflective material                 ASSESSMENT/PLAN:    ICD-10-CM   1. Posterior vitreous detachment of right eye  H43.811   2. Retinal  edema  H35.81 OCT, Retina - OU - Both Eyes  3. Essential hypertension  I10   4. Hypertensive retinopathy of both eyes  H35.033   5. Pseudophakia, both eyes  Z96.1     1,2. Partial PVD / vitreous syneresis OD  - symptomatic flashes/floaters x1 day w/ transient decrease in vision at initial visit  - symptoms improved today  - Discussed findings and prognosis  - No RT or RD on 360 scleral depressed exam  - Reviewed s/s of RT/RD  - Strict return precautions for any such RT/RD signs/symptoms  - pt is cleared from a retina standpoint for release to Dr. Marin Comment and resumption of primary eye care  - can f/u here PRN  3,4. Hypertensive retinopathy OU - discussed importance of tight BP control - monitor  5. Pseudophakia OU  - s/p CE/IOL (Dr. Alanda Slim, 06/2019)  - IOL in good position, doing well  - monitor   Ophthalmic Meds Ordered this visit:  No orders of the defined types were placed in this encounter.      Return if symptoms worsen or fail to improve.  There are no Patient Instructions on file for this visit.   Explained the diagnoses, plan, and follow up with the patient and they expressed understanding.  Patient expressed understanding of the importance of proper follow up care.   This document serves as a record of services personally performed by Gardiner Sleeper, MD, PhD. It was created on their behalf by San Jetty. Owens Shark, OA an ophthalmic technician. The creation of this record is the provider's dictation and/or activities during the visit.    Electronically signed by: San Jetty. [place], [place] 11.17.2021 1:19 PM   Gardiner Sleeper, M.D., Ph.D. Diseases & Surgery of the Retina and Vitreous [facility]  I have reviewed the above documentation for accuracy and completeness, and I agree with the above. Gardiner Sleeper, M.D., Ph.D. 01/30/20 1:20 PM   Abbreviations: M myopia (nearsighted); A astigmatism; H hyperopia (farsighted); P presbyopia; Mrx spectacle  prescription;  CTL contact lenses; OD right eye; OS left eye; OU both eyes  XT exotropia; ET esotropia; PEK punctate epithelial keratitis; PEE punctate epithelial erosions; DES dry eye syndrome; MGD meibomian gland dysfunction; ATs artificial tears; PFAT's preservative free artificial tears; [place] nuclear sclerotic cataract; PSC posterior subcapsular cataract; ERM epi-retinal membrane; PVD posterior vitreous detachment; RD retinal detachment; DM diabetes mellitus; DR diabetic retinopathy; NPDR non-proliferative diabetic retinopathy; PDR proliferative diabetic retinopathy; CSME clinically significant macular edema; DME diabetic macular edema; dbh dot blot hemorrhages; CWS cotton wool spot; POAG primary open angle glaucoma; C/D cup-to-disc ratio; HVF humphrey visual field; GVF goldmann visual field; OCT optical coherence tomography; IOP intraocular pressure; BRVO Branch retinal vein occlusion; CRVO central retinal vein occlusion; CRAO central retinal artery occlusion; BRAO branch retinal artery occlusion; RT retinal tear; SB scleral buckle; PPV pars plana vitrectomy; VH  Vitreous hemorrhage; PRP panretinal laser photocoagulation; IVK intravitreal kenalog; VMT vitreomacular traction; MH Macular hole;  NVD neovascularization of the disc; NVE neovascularization elsewhere; AREDS age related eye disease study; ARMD age related macular degeneration; POAG primary open angle glaucoma; EBMD epithelial/anterior basement membrane dystrophy; ACIOL anterior chamber intraocular lens; IOL intraocular lens; PCIOL posterior chamber intraocular lens; Phaco/IOL phacoemulsification with intraocular lens placement; [place] photorefractive keratectomy; LASIK laser assisted in situ keratomileusis; HTN hypertension; DM diabetes mellitus; COPD chronic obstructive pulmonary disease

## 2020-01-30 ENCOUNTER — Other Ambulatory Visit: Payer: Self-pay

## 2020-01-30 ENCOUNTER — Ambulatory Visit (INDEPENDENT_AMBULATORY_CARE_PROVIDER_SITE_OTHER): Payer: Medicare Other | Admitting: Ophthalmology

## 2020-01-30 ENCOUNTER — Encounter (INDEPENDENT_AMBULATORY_CARE_PROVIDER_SITE_OTHER): Payer: Self-pay | Admitting: Ophthalmology

## 2020-01-30 DIAGNOSIS — H35033 Hypertensive retinopathy, bilateral: Secondary | ICD-10-CM

## 2020-01-30 DIAGNOSIS — H43811 Vitreous degeneration, right eye: Secondary | ICD-10-CM | POA: Diagnosis not present

## 2020-01-30 DIAGNOSIS — Z961 Presence of intraocular lens: Secondary | ICD-10-CM

## 2020-01-30 DIAGNOSIS — I10 Essential (primary) hypertension: Secondary | ICD-10-CM

## 2020-01-30 DIAGNOSIS — H3581 Retinal edema: Secondary | ICD-10-CM

## 2020-02-10 DIAGNOSIS — Z720 Tobacco use: Secondary | ICD-10-CM | POA: Diagnosis not present

## 2020-02-10 DIAGNOSIS — G894 Chronic pain syndrome: Secondary | ICD-10-CM | POA: Diagnosis not present

## 2020-02-19 DIAGNOSIS — I10 Essential (primary) hypertension: Secondary | ICD-10-CM | POA: Diagnosis not present

## 2020-02-19 DIAGNOSIS — M545 Low back pain, unspecified: Secondary | ICD-10-CM | POA: Diagnosis not present

## 2020-02-19 NOTE — Progress Notes (Signed)
Cardiology Office Note  Date: 02/20/2020   ID: YASAMIN IFILL, DOB [birth year], MRN [id number]  PCP:  Elfredia Nevins, MD  Cardiologist:  No primary care provider on file. Electrophysiologist:  None   Chief Complaint: Follow-up CAD, HTN, HLD  History of Present Illness: BILLIJO YERKE is a 69 y.o. female with a history of CAD (CABG 2018 LIMA-LAD, SVG-OM, SVG-PDA), history of DVT, HTN, HLD, tobacco abuse, bilateral carotid artery stenosis, TIA  Last office visit with Dr. Purvis Sheffield 09/24/2018.  CAD was symptomatically stable.  She denied any chest pain, palpitations, orthopnea, shortness of breath.  She had some chronic left leg swelling.  Had a previous DVT in that extremity.  She was continuing aspirin and atorvastatin.  Did not tolerate carvedilol.  Continuing Xarelto 20 mg daily for history of DVT.  Continuing lisinopril for hypertension, continuing atorvastatin 40 mg for hyperlipidemia.  Was currently smoking 3 to 4 cigarettes daily with assistance for Chantix.  Bilateral carotid artery stenosis followed by vascular.  Continuing aspirin and statin.  She is here today for 1 year follow-up.  She denies any recent acute illnesses or hospitalizations.  Denies any recent anginal or exertional symptoms, palpitations or arrhythmias, orthostatic symptoms, PND, orthopnea, CVA or TIA-like symptoms.  No bleeding issues on Xarelto.  Denies any claudication-like symptoms, DVT or PE-like symptoms, or lower extremity edema.  States now she is only smoking occasionally.  Blood pressure elevated on arrival today.  She states at home it is usually much better.  She states her primary care provider recently placed her on Azor 5/40 mg p.o. daily in addition to her carvedilol and lisinopril and blood pressures have been better at home.  She states she has not followed up with vascular in over a year for recheck of her carotids.  Last carotid study in July 2020 showed bilateral ICA stenosis of 40 to 59%.  She has a  previous stent in her right ICA.   Past Medical History:  Diagnosis Date  . Anemia   . Anxiety   . Arthritis    pt. denies  . Bell's palsy   . CAD (coronary artery disease)    a. Cath 12/2014 - mild-moderate non-obstructive CAD with 60% mid RCA, 50% mid Cx-distal Cx, 30% distal LAD, 25% D2, EF 55-65%. b. 02/2017: NSTEMI with cath showing 60% LM stenosis and 100% RCA stenosis --> CT Surgery consulted with CABG on 02/21/2017 (LIMA-LAD, SVG-OM, and SVG-PDA)  . Depression   . Dizziness   . DVT (deep venous thrombosis) (HCC)    on Xarelto  . Dyspnea    W/ EXERTION pt. denies  . GERD (gastroesophageal reflux disease)   . Heart murmur    DX   AT BIRTH  . Hyperlipidemia   . Hypertension   . Hypertensive retinopathy    OU  . 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 Myocardial infarction (HCC)   . Neuropathy   . PE (pulmonary embolism)   . Tobacco abuse     Past Surgical History:  Procedure Laterality Date  . ABDOMINAL HYSTERECTOMY  1974  . CARDIAC CATHETERIZATION N/A 01/08/2015   Procedure: Left Heart Cath and Coronary Angiography;  Surgeon: Dolores Patty, MD;  Location: [facility] INVASIVE CV LAB;  Service: Cardiovascular;  Laterality: N/A;  . CAROTID ARTERY ANGIOPLASTY     stent right side  . CARPAL TUNNEL RELEASE     RIGHT  . CATARACT EXTRACTION Bilateral 06/2019   Dr. Genia Del  .  COLONOSCOPY WITH PROPOFOL N/A 02/16/2017   Procedure: COLONOSCOPY WITH PROPOFOL;  Surgeon: Rachael Fee, MD;  Location: [facility] ENDOSCOPY;  Service: Endoscopy;  Laterality: N/A;  . CORONARY ARTERY BYPASS GRAFT N/A 02/21/2017   Procedure: CORONARY ARTERY BYPASS GRAFTING (CABG) times three. LIMA to LAD, SVG to OM and SVG to PDA;  Surgeon: Loreli Slot, MD;  Location: [facility] OR;  Service: Open Heart Surgery;  Laterality: N/A;  . CORONARY STENT PLACEMENT Right 1980  . ENTEROSCOPY N/A 06/14/2017   Procedure: ENTEROSCOPY;  Surgeon: Rachael Fee, MD;  Location: [facility] ENDOSCOPY;  Service: Endoscopy;   Laterality: N/A;  . ESOPHAGOGASTRODUODENOSCOPY (EGD) WITH PROPOFOL N/A 02/16/2017   Procedure: ESOPHAGOGASTRODUODENOSCOPY (EGD) WITH PROPOFOL;  Surgeon: Rachael Fee, MD;  Location: [facility] ENDOSCOPY;  Service: Endoscopy;  Laterality: N/A;  . EYE SURGERY Bilateral 06/2019   Cat Sx - Dr. Genia Del  . GIVENS CAPSULE STUDY N/A 05/08/2017   Procedure: GIVENS CAPSULE STUDY;  Surgeon: Rachael Fee, MD;  Location: [facility] ENDOSCOPY;  Service: Endoscopy;  Laterality: N/A;  . KNEE ARTHROSCOPY W/ MENISCAL REPAIR     LEFT KNEE   FORMED DVT (SURG TO REMOVE BLOOD CLOT)  . LEFT HEART CATH AND CORONARY ANGIOGRAPHY N/A 02/19/2017   Procedure: LEFT HEART CATH AND CORONARY ANGIOGRAPHY;  Surgeon: Yvonne Kendall, MD;  Location: [facility] INVASIVE CV LAB;  Service: Cardiovascular;  Laterality: N/A;  . TEE WITHOUT CARDIOVERSION N/A 02/21/2017   Procedure: TRANSESOPHAGEAL ECHOCARDIOGRAM (TEE);  Surgeon: Loreli Slot, MD;  Location: [facility] OR;  Service: Open Heart Surgery;  Laterality: N/A;  . TONSILLECTOMY      Current Outpatient Medications  Medication Sig Dispense Refill  . allopurinol (ZYLOPRIM) 300 MG tablet Take 300 mg by mouth daily.    Marland Kitchen amLODipine-olmesartan (AZOR) 5-40 MG tablet Take 1 tablet by mouth daily.    . APO-VARENICLINE 1 MG tablet     . aspirin EC 81 MG EC tablet Take 1 tablet (81 mg total) by mouth daily.    Marland Kitchen atorvastatin (LIPITOR) 20 MG tablet     . atorvastatin (LIPITOR) 40 MG tablet TAKE ONE TABLET BY MOUTHAONCE DAILY.    . calcium carbonate (TUMS - DOSED IN MG ELEMENTAL CALCIUM) 500 MG chewable tablet Chew 2 tablets by mouth 3 (three) times daily with meals.     . carvedilol (COREG) 3.125 MG tablet TAKE (1) TABLET BY MOUTH TWICE DAILY. 180 tablet 3  . Cholecalciferol (VITAMIN D) 2000 units CAPS Take 2,000 Units by mouth daily.    . clobetasol ointment (TEMOVATE) 0.05 % SMARTSIG:Sparingly Topical Twice Daily    . DULoxetine (CYMBALTA) 20 MG capsule Take 20 mg by mouth 2 (two) times daily.    Marland Kitchen  FeFum-FePoly-FA-B Cmp-C-Biot (INTEGRA PLUS) CAPS Take 1 capsule daily with a meal 30 capsule 3  . gabapentin (NEURONTIN) 600 MG tablet Take 300-600 mg by mouth See admin instructions. Take 300 mg twice daily IN THE MORNING AND AT NOON AND  600 mg at bedtime.    Marland Kitchen lisinopril (ZESTRIL) 40 MG tablet Take 1 tablet (40 mg total) by mouth daily. 90 tablet 3  . Omega-3 Fatty Acids (FISH OIL) 1360 MG CAPS Take by mouth.    . Oxycodone HCl 10 MG TABS Take 10 mg by mouth 3 (three) times daily as needed. For pain    . rivaroxaban (XARELTO) 20 MG TABS tablet Take 20 mg by mouth daily with supper.     No current facility-administered medications for this visit.   Allergies:  Bee  venom   Social History: The patient  reports that she has been smoking cigarettes. She started smoking about 52 years ago. She has a 12.50 pack-year smoking history. She has never used smokeless tobacco. She reports that she does not drink alcohol and does not use drugs.   Family History: The patient's family history includes AAA (abdominal aortic aneurysm) in her mother; Dementia in her paternal grandmother; Heart attack in her brother, brother, brother, father, and mother; Heart disease in her brother, father, and maternal grandfather; Ovarian cancer in her maternal grandmother.   ROS:  Please see the history of present illness. Otherwise, complete review of systems is positive for none.  All other systems are reviewed and negative.   Physical Exam: VS:  BP (!) 158/82   Pulse 74   Ht 5\' 3"  (1.6 m)   Wt 232 lb (105.2 kg)   SpO2 97%   BMI 41.10 kg/m , BMI Body mass index is 41.1 kg/m.  Wt Readings from Last 3 Encounters:  02/20/20 232 lb (105.2 kg)  09/24/18 226 lb (102.5 kg)  09/18/18 223 lb (101.2 kg)    General: Patient appears comfortable at rest. Neck: Supple, no elevated JVP or carotid bruits, no thyromegaly. Lungs: Clear to auscultation, nonlabored breathing at rest. Cardiac: Regular rate and rhythm, no S3 or  significant systolic murmur, no pericardial rub. Extremities: No pitting edema, distal pulses 2+. Skin: Warm and dry. Musculoskeletal: No kyphosis. Neuropsychiatric: Alert and oriented x3, affect grossly appropriate.  ECG:  An ECG dated 02/20/2020 was personally reviewed today and demonstrated:  Sinus rhythm with fusion complexes rate of 71.  Cannot rule out anterior infarct, age undetermined.  Recent Labwork: No results found for requested labs within last 8760 hours.     Component Value Date/Time   CHOL 129 02/14/2017 0841   TRIG 162 (H) 02/14/2017 0841   HDL 30 (L) 02/14/2017 0841   CHOLHDL 4.3 02/14/2017 0841   VLDL 32 02/14/2017 0841   LDLCALC 67 02/14/2017 0841    Other Studies Reviewed Today:  Carotid artery duplex study 09/18/2018 Summary:  Right Carotid: Velocities in the right ICA are consistent with a 40-59%  stenosis distal to ICA stent. Non-hemodynamically significant plaque  <50% noted in the CCA. Right ECA not visualized. Patent right ICA stent. Left Carotid: Velocities in the left ICA are consistent with a 40-59%  Stenosis. Non-hemodynamically significant plaque noted in the CCA. The  ECA appears >50% stenosed.  Vertebrals: Bilateral vertebral arteries demonstrate antegrade flow.  Subclavians: Left subclavian artery was stenotic. Normal flow hemodynamics  were seen in the right subclavian artery.   Cardiac catheterization 02/19/2017 Conclusions: 1. Significant multivessel coronary artery disease, including 60% ostial LMCA stenosis with catheter dampening, 70% dominant OM stenosis, and subacute versus chronic occlusion of the mid RCA with faint bridging and left-to-right collaterals. 2. Normal left ventricular contraction with mildly elevated filling pressure.  Recommendations: 1. Cardiac surgery consultation for CABG. 2. Continue aggressive secondary prevention, including aspirin and statin therapy. We will defer adding heparin at this time, given that  infarct occurred more than 48 hours ago, the patient is asymptomatic, and she presented with severe iron deficiency anemia. Diagnostic Dominance: Right        Assessment and Plan:  1. CAD in native artery   2. Essential hypertension   3. Mixed hyperlipidemia   4. Tobacco abuse   5. Bilateral carotid artery stenosis    1. CAD in native artery Denies any anginal or exertional symptoms.  Continue  aspirin 81 mg daily, carvedilol 3.125 mg p.o. twice daily.   2. Essential hypertension Blood pressure elevated on arrival today.  Patient states her blood pressure at home is usually much better.  States that her recent PCP visit was around 117 systolic.  States her PCP recently started her on amlodipine/olmesartan (Azor) 5/40 mg p.o. daily.  Continue carvedilol 3.125 mg p.o. twice daily.  Continue lisinopril 40 mg p.o. daily.  3. Mixed hyperlipidemia Continue atorvastatin 60 mg p.o. daily.  Labs followed by PCP.  4. Tobacco abuse Patient states he is only smoking occasionally.  Advised cessation  5. Bilateral carotid artery stenosis Has been following with vein and vascular.  Last carotid Doppler study July 2020 showed bilateral ICA stenosis of 40 to 59%.  We will arrange medical follow-up with vein and vascular.  Medication Adjustments/Labs and Tests Ordered: Current medicines are reviewed at length with the patient today.  Concerns regarding medicines are outlined above.   Disposition: Follow-up with Dr. Diona Browner or APP 1 year Signed, Rennis Harding, NP 02/20/2020 3:10 PM    [facility] at [facility] [address] [place], [place], [place] [address] Phone: [phone]; Fax: [phone]

## 2020-02-20 ENCOUNTER — Encounter: Payer: Self-pay | Admitting: Family Medicine

## 2020-02-20 ENCOUNTER — Ambulatory Visit (INDEPENDENT_AMBULATORY_CARE_PROVIDER_SITE_OTHER): Payer: Medicare Other | Admitting: Family Medicine

## 2020-02-20 ENCOUNTER — Other Ambulatory Visit: Payer: Self-pay

## 2020-02-20 VITALS — BP 158/82 | HR 74 | Ht 63.0 in | Wt 232.0 lb

## 2020-02-20 DIAGNOSIS — I6523 Occlusion and stenosis of bilateral carotid arteries: Secondary | ICD-10-CM

## 2020-02-20 DIAGNOSIS — Z720 Tobacco use: Secondary | ICD-10-CM

## 2020-02-20 DIAGNOSIS — I10 Essential (primary) hypertension: Secondary | ICD-10-CM | POA: Diagnosis not present

## 2020-02-20 DIAGNOSIS — I2510 Atherosclerotic heart disease of native coronary artery without angina pectoris: Secondary | ICD-10-CM

## 2020-02-20 DIAGNOSIS — E782 Mixed hyperlipidemia: Secondary | ICD-10-CM | POA: Diagnosis not present

## 2020-02-20 NOTE — Patient Instructions (Addendum)
Your physician wants you to follow-up in: 1 YEAR WITH MD You will receive a reminder letter in the mail two months in advance. If you don't receive a letter, please call our office to schedule the follow-up appointment.  Your physician recommends that you continue on your current medications as directed. Please refer to the Current Medication list given to you today.  Thank you for choosing [facility]!!   

## 2020-03-12 DIAGNOSIS — G894 Chronic pain syndrome: Secondary | ICD-10-CM | POA: Diagnosis not present

## 2020-03-12 DIAGNOSIS — Z720 Tobacco use: Secondary | ICD-10-CM | POA: Diagnosis not present

## 2020-03-12 DIAGNOSIS — I25708 Atherosclerosis of coronary artery bypass graft(s), unspecified, with other forms of angina pectoris: Secondary | ICD-10-CM | POA: Diagnosis not present

## 2020-03-18 DIAGNOSIS — G894 Chronic pain syndrome: Secondary | ICD-10-CM | POA: Diagnosis not present

## 2020-03-18 DIAGNOSIS — I10 Essential (primary) hypertension: Secondary | ICD-10-CM | POA: Diagnosis not present

## 2020-03-18 DIAGNOSIS — E7849 Other hyperlipidemia: Secondary | ICD-10-CM | POA: Diagnosis not present

## 2020-03-18 DIAGNOSIS — I509 Heart failure, unspecified: Secondary | ICD-10-CM | POA: Diagnosis not present

## 2020-03-22 ENCOUNTER — Other Ambulatory Visit: Payer: Self-pay | Admitting: Cardiology

## 2020-04-07 ENCOUNTER — Other Ambulatory Visit: Payer: Self-pay | Admitting: *Deleted

## 2020-04-07 MED ORDER — CARVEDILOL 3.125 MG PO TABS: ORAL_TABLET | ORAL | 3 refills | Status: AC

## 2020-04-07 MED ORDER — LISINOPRIL 40 MG PO TABS: 40.0000 mg | ORAL_TABLET | Freq: Every day | ORAL | 1 refills | Status: DC

## 2020-04-10 DIAGNOSIS — I25708 Atherosclerosis of coronary artery bypass graft(s), unspecified, with other forms of angina pectoris: Secondary | ICD-10-CM | POA: Diagnosis not present

## 2020-04-10 DIAGNOSIS — Z720 Tobacco use: Secondary | ICD-10-CM | POA: Diagnosis not present

## 2020-04-15 DIAGNOSIS — R42 Dizziness and giddiness: Secondary | ICD-10-CM | POA: Diagnosis not present

## 2020-04-15 DIAGNOSIS — G894 Chronic pain syndrome: Secondary | ICD-10-CM | POA: Diagnosis not present

## 2020-04-15 DIAGNOSIS — I509 Heart failure, unspecified: Secondary | ICD-10-CM | POA: Diagnosis not present

## 2020-04-15 DIAGNOSIS — I10 Essential (primary) hypertension: Secondary | ICD-10-CM | POA: Diagnosis not present

## 2020-04-23 NOTE — Progress Notes (Signed)
When was the dose increased. The reason I am asking is that her LDL was 176 . If he increased it recently as a result of the recent LDL of 176 just have her stay on the current dose. If not she needs to go up to 80 mg daily. Thanks

## 2020-05-03 ENCOUNTER — Telehealth: Payer: Self-pay | Admitting: *Deleted

## 2020-05-03 MED ORDER — ATORVASTATIN CALCIUM 40 MG PO TABS: 60.0000 mg | ORAL_TABLET | Freq: Every day | ORAL | Status: DC

## 2020-05-03 NOTE — Telephone Encounter (Signed)
-----   Message from Verta Ellen., NP sent at 04/29/2020  1:28 PM EST ----- She has two separate dosages of Lipitor . One is 40 and the other 20 mg. I assume the provider meant for her to start taking 60 mg daily. If the LDL is still 176 in spite of being on 60 mg of lipitor daily then it needs to be increased to 80 mg. There is recent lab work from PCP collected this month stating her LDL was 176. Make sure she has been taking the lipitor as directed . If not ,then encourage her to resume the current dose and get a repeat study in 6-8 weeks. Hold off on the 80 mg dosage for now.

## 2020-05-03 NOTE — Telephone Encounter (Signed)
Laurine Blazer, LPN  3/56/8616 8:37 PM EST Back to Top     Patient stated that her pcp increased her from the 40mg  to 60mg  just within the last few weeks. She will continue to follow with pcp for management of her cholesterol & will have f/u labs done by pcp as well.    Verta Ellen., NP  04/29/2020 1:30 PM EST      It appears she had recent lab work with PCP and her LDL was 176. If she is on 40 mg and the LDL is still 176 then it needs to be increased to 80 mg. However if the 40 mg was just started then we need to get lipid panel and LFTs in 6 to 8 weeks before we increase the dose to 80 mg. Make sure she is taking the 40 mg daily as directed. Get a follow-up lipid study in 6 to 8 weeks. If still elevated we need to go up to 80 mg daily.   Verta Ellen., NP  04/29/2020 1:28 PM EST      She has two separate dosages of Lipitor . One is 40 and the other 20 mg. I assume the provider meant for her to start taking 60 mg daily. If the LDL is still 176 in spite of being on 60 mg of lipitor daily then it needs to be increased to 80 mg. There is recent lab work from PCP collected this month stating her LDL was 176. Make sure she has been taking the lipitor as directed . If not ,then encourage her to resume the current dose and get a repeat study in 6-8 weeks. Hold off on the 80 mg dosage for now.   Laurine Blazer, LPN  2/90/2111 5:52 PM EST      Patient states that she is already taking the 40mg  per her pcp. Was on 20mg  initially, then increased to 40mg . Do you still want her to increase further to 80mg  daily?    Verta Ellen., NP  04/22/2020 10:01 PM EST      CBC looks good.Marland Kitchen LDL is elevated at 176. Change atorvastatin to 80 mg daily and get a follow up FLP and LFT in 6-8 weeks after starting new dosage. It appears she is currently taking 60 mg of atorvastatin. Renal function is decreased. When compared to previous labs in epic. She was recently started on amlodipine/  olmesartan by her PCP.

## 2020-05-14 DIAGNOSIS — I700 Atherosclerosis of aorta: Secondary | ICD-10-CM | POA: Diagnosis not present

## 2020-05-14 DIAGNOSIS — Z0000 Encounter for general adult medical examination without abnormal findings: Secondary | ICD-10-CM | POA: Diagnosis not present

## 2020-06-05 ENCOUNTER — Other Ambulatory Visit: Payer: Self-pay

## 2020-06-05 DIAGNOSIS — I6523 Occlusion and stenosis of bilateral carotid arteries: Secondary | ICD-10-CM

## 2020-06-09 DIAGNOSIS — I25708 Atherosclerosis of coronary artery bypass graft(s), unspecified, with other forms of angina pectoris: Secondary | ICD-10-CM | POA: Diagnosis not present

## 2020-06-09 DIAGNOSIS — Z720 Tobacco use: Secondary | ICD-10-CM | POA: Diagnosis not present

## 2020-06-09 DIAGNOSIS — E7849 Other hyperlipidemia: Secondary | ICD-10-CM | POA: Diagnosis not present

## 2020-06-11 ENCOUNTER — Ambulatory Visit: Payer: Medicare Other | Admitting: Physician Assistant

## 2020-06-11 ENCOUNTER — Other Ambulatory Visit: Payer: Self-pay

## 2020-06-11 ENCOUNTER — Ambulatory Visit (HOSPITAL_COMMUNITY)
Admission: RE | Admit: 2020-06-11 | Discharge: 2020-06-11 | Disposition: A | Discharge status: Home / Self Care | Payer: Medicare Other | Source: Ambulatory Visit | Attending: Vascular Surgery | Admitting: Vascular Surgery

## 2020-06-11 ENCOUNTER — Ambulatory Visit (INDEPENDENT_AMBULATORY_CARE_PROVIDER_SITE_OTHER)
Admission: RE | Admit: 2020-06-11 | Discharge: 2020-06-11 | Disposition: A | Discharge status: Home / Self Care | Payer: Medicare Other | Source: Ambulatory Visit | Attending: Vascular Surgery | Admitting: Vascular Surgery

## 2020-06-11 VITALS — BP 212/92 | HR 67 | Temp 98.2°F | Resp 20 | Ht 63.0 in | Wt 233.3 lb

## 2020-06-11 DIAGNOSIS — I6523 Occlusion and stenosis of bilateral carotid arteries: Secondary | ICD-10-CM

## 2020-06-11 NOTE — Progress Notes (Signed)
History of Present Illness:  Patient is a 70 y.o. year old female who presents for evaluation of carotid stenosis.  S/P symptomatic right internal carotid artery stenosis with left arm weakness symptoms.   She is currently is on Xarelto for prior DVT and pulmonary embolus. 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  She smokes occasionally and working on quitting.   The patient denies symptoms of TIA, amaurosis, or stroke.  The patient is currently on ASA antiplatelet therapy.    Past Medical History:  Diagnosis Date  . Anemia   . Anxiety   . Arthritis    pt. denies  . Bell's palsy   . CAD (coronary artery disease)    a. Cath 12/2014 - mild-moderate non-obstructive CAD with 60% mid RCA, 50% mid Cx-distal Cx, 30% distal LAD, 25% D2, EF 55-65%. b. 02/2017: NSTEMI with cath showing 60% LM stenosis and 100% RCA stenosis --> CT Surgery consulted with CABG on 02/21/2017 (LIMA-LAD, SVG-OM, and SVG-PDA)  . Depression   . Dizziness   . DVT (deep venous thrombosis) (HCC)    on Xarelto  . Dyspnea    W/ EXERTION pt. denies  . GERD (gastroesophageal reflux disease)   . Heart murmur    DX   AT BIRTH  . Hyperlipidemia   . Hypertension   . Hypertensive retinopathy    OU  . 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 Myocardial infarction ([place])   . Neuropathy   . PE (pulmonary embolism)   . Tobacco abuse     Past Surgical History:  Procedure Laterality Date  . ABDOMINAL HYSTERECTOMY  1974  . CARDIAC CATHETERIZATION N/A 01/08/2015   Procedure: Left Heart Cath and Coronary Angiography;  Surgeon: Jolaine Artist, MD;  Location: [place] [facility];  Service: Cardiovascular;  Laterality: N/A;  . CAROTID ARTERY ANGIOPLASTY     stent right side  . CARPAL TUNNEL RELEASE     RIGHT  . CATARACT EXTRACTION Bilateral 06/2019    Dr. Alanda Slim  . COLONOSCOPY WITH PROPOFOL N/A 02/16/2017   Procedure: COLONOSCOPY WITH PROPOFOL;  Surgeon: [name], MD;  Location: [facility] ENDOSCOPY;  Service: Endoscopy;  Laterality: N/A;  . CORONARY ARTERY BYPASS GRAFT N/A 02/21/2017   Procedure: CORONARY ARTERY BYPASS GRAFTING (CABG) times three. LIMA to LAD, SVG to OM and SVG to PDA;  Surgeon: Melrose Nakayama, MD;  Location: [facility];  Service: Open Heart Surgery;  Laterality: N/A;  . CORONARY STENT PLACEMENT Right 1980  . ENTEROSCOPY N/A 06/14/2017   Procedure: ENTEROSCOPY;  Surgeon: [name], MD;  Location: [facility] ENDOSCOPY;  Service: Endoscopy;  Laterality: N/A;  . ESOPHAGOGASTRODUODENOSCOPY (EGD) WITH PROPOFOL N/A 02/16/2017   Procedure: ESOPHAGOGASTRODUODENOSCOPY (EGD) WITH PROPOFOL;  Surgeon: [name], MD;  Location: [facility] ENDOSCOPY;  Service: Endoscopy;  Laterality: N/A;  . EYE SURGERY Bilateral 06/2019   Cat Sx - Dr. Alanda Slim  . GIVENS CAPSULE STUDY N/A 05/08/2017   Procedure: GIVENS CAPSULE STUDY;  Surgeon: [name], MD;  Location: [facility] ENDOSCOPY;  Service: Endoscopy;  Laterality: N/A;  . KNEE ARTHROSCOPY W/ MENISCAL REPAIR     LEFT KNEE   FORMED DVT (SURG TO REMOVE BLOOD CLOT)  . LEFT HEART CATH AND CORONARY ANGIOGRAPHY N/A 02/19/2017   Procedure: LEFT HEART CATH AND CORONARY ANGIOGRAPHY;  Surgeon:  End, Harrell Gave, MD;  Location: [place] [facility];  Service: Cardiovascular;  Laterality: N/A;  . TEE WITHOUT CARDIOVERSION N/A 02/21/2017   Procedure: TRANSESOPHAGEAL ECHOCARDIOGRAM (TEE);  Surgeon: Melrose Nakayama, MD;  Location: [place];  Service: Open Heart Surgery;  Laterality: N/A;  . TONSILLECTOMY       Social History Social History   Tobacco Use  . Smoking status: Current Every Day Smoker    Packs/day: 0.25    Years: 50.00    Pack years: 12.50    Types: Cigarettes    Start date: 06/18/1967  . Smokeless tobacco: Never Used  . Tobacco comment: 3-4 a day  Vaping Use  . Vaping Use: Never used   Substance Use Topics  . Alcohol use: No    Alcohol/week: 0.0 standard drinks  . Drug use: No    Family History Family History  Problem Relation Age of Onset  . Heart attack Mother   . AAA (abdominal aortic aneurysm) Mother   . Heart attack Father   . Heart disease Father   . Heart attack Brother        CABG  . Heart disease Brother   . Heart attack Brother        CABG  . Heart attack Brother        CABG  . Ovarian cancer Maternal Grandmother   . Heart disease Maternal Grandfather   . Dementia Paternal Grandmother     Allergies  Allergies  Allergen Reactions  . Bee Venom Anaphylaxis     Current Outpatient Medications  Medication Sig Dispense Refill  . allopurinol (ZYLOPRIM) 300 MG tablet Take 300 mg by mouth daily.    Marland Kitchen amLODipine-olmesartan (AZOR) 5-40 MG tablet Take 1 tablet by mouth daily.    . APO-VARENICLINE 1 MG tablet     . aspirin EC 81 MG EC tablet Take 1 tablet (81 mg total) by mouth daily.    Marland Kitchen atorvastatin (LIPITOR) 40 MG tablet Take 1.5 tablets (60 mg total) by mouth daily.    . calcium carbonate (TUMS - DOSED IN MG ELEMENTAL CALCIUM) 500 MG chewable tablet Chew 2 tablets by mouth 3 (three) times daily with meals.     . carvedilol (COREG) 3.125 MG tablet TAKE (1) TABLET BY MOUTH TWICE DAILY. 180 tablet 3  . Cholecalciferol (VITAMIN D) 2000 units CAPS Take 2,000 Units by mouth daily.    . clobetasol ointment (TEMOVATE) 0.05 % SMARTSIG:Sparingly Topical Twice Daily    . DULoxetine (CYMBALTA) 20 MG capsule Take 20 mg by mouth 2 (two) times daily.    Marland Kitchen FeFum-FePoly-FA-B Cmp-C-Biot (INTEGRA PLUS) CAPS Take 1 capsule daily with a meal 30 capsule 3  . gabapentin (NEURONTIN) 600 MG tablet Take 300-600 mg by mouth See admin instructions. Take 300 mg twice daily IN THE MORNING AND AT NOON AND  600 mg at bedtime.    Marland Kitchen lisinopril (ZESTRIL) 40 MG tablet Take 1 tablet (40 mg total) by mouth daily. 90 tablet 1  . Omega-3 Fatty Acids (FISH OIL) 1360 MG CAPS Take by  mouth.    . Oxycodone HCl 10 MG TABS Take 10 mg by mouth 3 (three) times daily as needed. For pain    . rivaroxaban (XARELTO) 20 MG TABS tablet Take 20 mg by mouth daily with supper.     No current facility-administered medications for this visit.    ROS:   General:  No weight loss, Fever, chills  HEENT: No recent headaches, no nasal bleeding, no visual changes,  no sore throat  Neurologic: No dizziness, blackouts, seizures. No recent symptoms of stroke or mini- stroke. No recent episodes of slurred speech, or temporary blindness.  Cardiac: No recent episodes of chest pain/pressure, no shortness of breath at rest.  No shortness of breath with exertion.  Denies history of atrial fibrillation or irregular heartbeat  Vascular: No history of rest pain in feet.  No history of claudication.  No history of non-healing ulcer, No history of DVT   Pulmonary: No home oxygen, no productive cough, no hemoptysis,  No asthma or wheezing  Musculoskeletal:  [ ]  Arthritis, [ ]  Low back pain,  [ ]  Joint pain [x]  peripheral neuropathy  Hematologic:No history of hypercoagulable state.  No history of easy bleeding.  No history of anemia  Gastrointestinal: No hematochezia or melena,  No gastroesophageal reflux, no trouble swallowing  Urinary: [ ]  chronic Kidney disease, [ ]  on HD - [ ]  MWF or [ ]  TTHS, [ ]  Burning with urination, [ ]  Frequent urination, [ ]  Difficulty urinating;   Skin: No rashes  Psychological: No history of anxiety,  Positive history of depression   Physical Examination  Vitals:   06/11/20 1412 06/11/20 1417  BP: (!) 194/84 (!) 212/92  Pulse: 67   Resp: 20   Temp: 98.2 F (36.8 C)   TempSrc: Temporal   SpO2: 100%   Weight: 233 lb 4.8 oz (105.8 kg)   Height: 5\' 3"  (1.6 m)     Body mass index is 41.33 kg/m.  General:  Alert and oriented, no acute distress HEENT: Normal Neck: No bruit or JVD Pulmonary: Clear to auscultation bilaterally Cardiac: Regular Rate and Rhythm  without murmur Gastrointestinal: Soft, non-tender, non-distended, no mass, no scars Skin: No rash Extremity Pulses:  2+ radial, brachial, femoral, non palpable dorsalis pedis, posterior tibial pulses bilaterally Musculoskeletal: No deformity or edema  Neurologic: Upper and lower extremity motor 5/5 and symmetric  DATA:  ABI Findings:  +---------+------------------+-----+--------+--------+  Right  Rt Pressure (mmHg)IndexWaveformComment   +---------+------------------+-----+--------+--------+  Brachial 209                     +---------+------------------+-----+--------+--------+  ATA   205        0.98 biphasic      +---------+------------------+-----+--------+--------+  DP    238        1.14 biphasic      +---------+------------------+-----+--------+--------+  Great Toe103        0.49           +---------+------------------+-----+--------+--------+   +---------+------------------+-----+----------+-------+  Left   Lt Pressure (mmHg)IndexWaveform Comment  +---------+------------------+-----+----------+-------+  Brachial 197                      +---------+------------------+-----+----------+-------+  PTA   211        1.01 triphasic       +---------+------------------+-----+----------+-------+  DP    171        0.82 monophasic      +---------+------------------+-----+----------+-------+  Ellen Henri        0.54            +---------+------------------+-----+----------+-------+   +-------+-----------+-----------+------------+------------+  ABI/TBIToday's ABIToday's TBIPrevious ABIPrevious TBI  +-------+-----------+-----------+------------+------------+  Right 1.14    0.49                  +-------+-----------+-----------+------------+------------+  Left  1.01     0.54                  +-------+-----------+-----------+------------+------------+  No previous ABI.    Summary:  Right: Resting right ankle-brachial index is within normal range. No  evidence of significant right lower extremity arterial disease. The right  toe-brachial index is abnormal. RT great toe pressure = 103 mmHg.   Left: Resting left ankle-brachial index is within normal range. No  evidence of significant left lower extremity arterial disease. The left  toe-brachial index is abnormal. LT Great toe pressure = 112 mmHg.      Right Carotid Findings:  +----------+--------+--------+--------+------------------+-----------------  ---+       PSV cm/sEDV cm/sStenosisPlaque DescriptionComments         +----------+--------+--------+--------+------------------+-----------------  ---+  CCA Prox         Occluded                      +----------+--------+--------+--------+------------------+-----------------  ---+  CCA Mid          Occluded                      +----------+--------+--------+--------+------------------+-----------------  ---+  CCA Distal        Occluded                      +----------+--------+--------+--------+------------------+-----------------  ---+  ICA Prox         Occluded                      +----------+--------+--------+--------+------------------+-----------------  ---+  ICA Mid          Occluded                      +----------+--------+--------+--------+------------------+-----------------  ---+  ICA Distal        Occluded                      +----------+--------+--------+--------+------------------+-----------------  ---+  ECA    114   24                not well   visualized.  +----------+--------+--------+--------+------------------+-----------------  ---+   +----------+--------+-------+----------------+-------------------+       PSV cm/sEDV cmsDescribe    Arm Pressure (mmHG)  +----------+--------+-------+----------------+-------------------+  [id number]       Multiphasic, WNL            +----------+--------+-------+----------------+-------------------+   +---------+--------+---+--------+--+---------+  VertebralPSV cm/s164EDV cm/s24Antegrade  +---------+--------+---+--------+--+---------+   Right Stent(s):  +---------------+++--------+++  Prox to Stent occluded  +---------------+++--------+++  Proximal Stent occluded  +---------------+++--------+++  Mid Stent   occluded  +---------------+++--------+++  Distal Stent  occluded  +---------------+++--------+++  Distal to Stentoccluded  +---------------+++--------+++          Left Carotid Findings:  +----------+--------+--------+--------+------------------+--------+       PSV cm/sEDV cm/sStenosisPlaque DescriptionComments  +----------+--------+--------+--------+------------------+--------+  CCA Prox 111   24       heterogenous         +----------+--------+--------+--------+------------------+--------+  CCA Mid  176   36       heterogenous         +----------+--------+--------+--------+------------------+--------+  CCA Distal162   27                      +----------+--------+--------+--------+------------------+--------+  ICA Prox 172   59       homogeneous          +----------+--------+--------+--------+------------------+--------+  ICA Mid  244   67   40-59%                +----------+--------+--------+--------+------------------+--------+  ICA Distal148    28                      +----------+--------+--------+--------+------------------+--------+  ECA    211   25   >50%  heterogenous         +----------+--------+--------+--------+------------------+--------+   +----------+--------+--------+----------------+-------------------+       PSV cm/sEDV cm/sDescribe    Arm Pressure (mmHG)  +----------+--------+--------+----------------+-------------------+  [id number]   38   Multiphasic, WNL            +----------+--------+--------+----------------+-------------------+   +---------+--------+--+--------+--+---------+  VertebralPSV cm/s75EDV cm/s27Antegrade  +---------+--------+--+--------+--+---------+         Summary:  Right Carotid: Evidence consistent with a total occlusion of the right  ICA. The         CCA appears occluded.   Left Carotid: Velocities in the left mid ICA are consistent with a 40-59%        stenosis. The ECA appears >50% stenosed.   Vertebrals: Bilateral vertebral arteries demonstrate antegrade flow.  Subclavians: Normal flow hemodynamics were seen in bilateral subclavian        arteries.   ASSESSMENT:  Right Carotid stent occlusion without symptoms Left carotid stenosis stable 40-59% asymptomatic The carotid duplex was reviewed with Dr. Donzetta Matters. ABI show evidence of calcified vessels without symptoms of claudication, non healing wounds or rest pain.  PLAN: I will have her f/u in 6 months for repeat left carotid surveillance.  Exercise, eat well, cont. Asa, Lipitor and xarelto daily for maximum medical improvement.  Smoking cessations.   Roxy Horseman PA-C [facility] Office: [phone]  MD in clinic [place]

## 2020-06-15 ENCOUNTER — Other Ambulatory Visit: Payer: Self-pay

## 2020-06-15 DIAGNOSIS — Z8673 Personal history of transient ischemic attack (TIA), and cerebral infarction without residual deficits: Secondary | ICD-10-CM

## 2020-06-17 DIAGNOSIS — I10 Essential (primary) hypertension: Secondary | ICD-10-CM | POA: Diagnosis not present

## 2020-06-17 DIAGNOSIS — Z86718 Personal history of other venous thrombosis and embolism: Secondary | ICD-10-CM | POA: Diagnosis not present

## 2020-06-17 DIAGNOSIS — G894 Chronic pain syndrome: Secondary | ICD-10-CM | POA: Diagnosis not present

## 2020-07-10 DIAGNOSIS — Z720 Tobacco use: Secondary | ICD-10-CM | POA: Diagnosis not present

## 2020-07-10 DIAGNOSIS — I25708 Atherosclerosis of coronary artery bypass graft(s), unspecified, with other forms of angina pectoris: Secondary | ICD-10-CM | POA: Diagnosis not present

## 2020-07-10 DIAGNOSIS — E7849 Other hyperlipidemia: Secondary | ICD-10-CM | POA: Diagnosis not present

## 2020-07-15 DIAGNOSIS — G894 Chronic pain syndrome: Secondary | ICD-10-CM | POA: Diagnosis not present

## 2020-08-10 DIAGNOSIS — I25708 Atherosclerosis of coronary artery bypass graft(s), unspecified, with other forms of angina pectoris: Secondary | ICD-10-CM | POA: Diagnosis not present

## 2020-08-10 DIAGNOSIS — Z720 Tobacco use: Secondary | ICD-10-CM | POA: Diagnosis not present

## 2020-08-10 DIAGNOSIS — E7849 Other hyperlipidemia: Secondary | ICD-10-CM | POA: Diagnosis not present

## 2020-08-12 DIAGNOSIS — G894 Chronic pain syndrome: Secondary | ICD-10-CM | POA: Diagnosis not present

## 2020-08-12 DIAGNOSIS — I10 Essential (primary) hypertension: Secondary | ICD-10-CM | POA: Diagnosis not present

## 2020-09-09 DIAGNOSIS — E7849 Other hyperlipidemia: Secondary | ICD-10-CM | POA: Diagnosis not present

## 2020-09-09 DIAGNOSIS — Z720 Tobacco use: Secondary | ICD-10-CM | POA: Diagnosis not present

## 2020-09-09 DIAGNOSIS — I25708 Atherosclerosis of coronary artery bypass graft(s), unspecified, with other forms of angina pectoris: Secondary | ICD-10-CM | POA: Diagnosis not present

## 2020-09-10 DIAGNOSIS — I25708 Atherosclerosis of coronary artery bypass graft(s), unspecified, with other forms of angina pectoris: Secondary | ICD-10-CM | POA: Diagnosis not present

## 2020-09-10 DIAGNOSIS — I509 Heart failure, unspecified: Secondary | ICD-10-CM | POA: Diagnosis not present

## 2020-09-10 DIAGNOSIS — G894 Chronic pain syndrome: Secondary | ICD-10-CM | POA: Diagnosis not present

## 2020-09-10 DIAGNOSIS — Z86718 Personal history of other venous thrombosis and embolism: Secondary | ICD-10-CM | POA: Diagnosis not present

## 2020-09-10 DIAGNOSIS — I10 Essential (primary) hypertension: Secondary | ICD-10-CM | POA: Diagnosis not present

## 2020-10-14 DIAGNOSIS — I10 Essential (primary) hypertension: Secondary | ICD-10-CM | POA: Diagnosis not present

## 2020-10-14 DIAGNOSIS — G894 Chronic pain syndrome: Secondary | ICD-10-CM | POA: Diagnosis not present

## 2020-11-10 DIAGNOSIS — E7849 Other hyperlipidemia: Secondary | ICD-10-CM | POA: Diagnosis not present

## 2020-11-10 DIAGNOSIS — I25708 Atherosclerosis of coronary artery bypass graft(s), unspecified, with other forms of angina pectoris: Secondary | ICD-10-CM | POA: Diagnosis not present

## 2020-11-12 DIAGNOSIS — G894 Chronic pain syndrome: Secondary | ICD-10-CM | POA: Diagnosis not present

## 2020-11-12 DIAGNOSIS — I10 Essential (primary) hypertension: Secondary | ICD-10-CM | POA: Diagnosis not present

## 2020-11-12 DIAGNOSIS — Z23 Encounter for immunization: Secondary | ICD-10-CM | POA: Diagnosis not present

## 2020-11-15 ENCOUNTER — Other Ambulatory Visit: Payer: Self-pay | Admitting: Family Medicine

## 2020-12-10 DIAGNOSIS — E7849 Other hyperlipidemia: Secondary | ICD-10-CM | POA: Diagnosis not present

## 2020-12-10 DIAGNOSIS — I2699 Other pulmonary embolism without acute cor pulmonale: Secondary | ICD-10-CM | POA: Diagnosis not present

## 2020-12-10 DIAGNOSIS — E782 Mixed hyperlipidemia: Secondary | ICD-10-CM | POA: Diagnosis not present

## 2020-12-10 DIAGNOSIS — Z720 Tobacco use: Secondary | ICD-10-CM | POA: Diagnosis not present

## 2020-12-10 DIAGNOSIS — F17210 Nicotine dependence, cigarettes, uncomplicated: Secondary | ICD-10-CM | POA: Diagnosis not present

## 2020-12-10 DIAGNOSIS — G894 Chronic pain syndrome: Secondary | ICD-10-CM | POA: Diagnosis not present

## 2020-12-10 DIAGNOSIS — Z951 Presence of aortocoronary bypass graft: Secondary | ICD-10-CM | POA: Diagnosis not present

## 2020-12-10 DIAGNOSIS — I10 Essential (primary) hypertension: Secondary | ICD-10-CM | POA: Diagnosis not present

## 2020-12-10 DIAGNOSIS — I509 Heart failure, unspecified: Secondary | ICD-10-CM | POA: Diagnosis not present

## 2020-12-10 DIAGNOSIS — I25708 Atherosclerosis of coronary artery bypass graft(s), unspecified, with other forms of angina pectoris: Secondary | ICD-10-CM | POA: Diagnosis not present

## 2021-01-07 DIAGNOSIS — I25708 Atherosclerosis of coronary artery bypass graft(s), unspecified, with other forms of angina pectoris: Secondary | ICD-10-CM | POA: Diagnosis not present

## 2021-01-07 DIAGNOSIS — I10 Essential (primary) hypertension: Secondary | ICD-10-CM | POA: Diagnosis not present

## 2021-01-07 DIAGNOSIS — G894 Chronic pain syndrome: Secondary | ICD-10-CM | POA: Diagnosis not present

## 2021-01-10 DIAGNOSIS — I25709 Atherosclerosis of coronary artery bypass graft(s), unspecified, with unspecified angina pectoris: Secondary | ICD-10-CM | POA: Diagnosis not present

## 2021-01-10 DIAGNOSIS — E782 Mixed hyperlipidemia: Secondary | ICD-10-CM | POA: Diagnosis not present

## 2021-01-28 DIAGNOSIS — E782 Mixed hyperlipidemia: Secondary | ICD-10-CM | POA: Diagnosis not present

## 2021-01-28 DIAGNOSIS — M545 Low back pain, unspecified: Secondary | ICD-10-CM | POA: Diagnosis not present

## 2021-01-28 DIAGNOSIS — I25708 Atherosclerosis of coronary artery bypass graft(s), unspecified, with other forms of angina pectoris: Secondary | ICD-10-CM | POA: Diagnosis not present

## 2021-01-28 DIAGNOSIS — F17210 Nicotine dependence, cigarettes, uncomplicated: Secondary | ICD-10-CM | POA: Diagnosis not present

## 2021-02-09 DIAGNOSIS — I25708 Atherosclerosis of coronary artery bypass graft(s), unspecified, with other forms of angina pectoris: Secondary | ICD-10-CM | POA: Diagnosis not present

## 2021-02-09 DIAGNOSIS — E782 Mixed hyperlipidemia: Secondary | ICD-10-CM | POA: Diagnosis not present

## 2021-02-25 DIAGNOSIS — M79601 Pain in right arm: Secondary | ICD-10-CM | POA: Diagnosis not present

## 2021-02-25 DIAGNOSIS — G894 Chronic pain syndrome: Secondary | ICD-10-CM | POA: Diagnosis not present

## 2021-02-25 DIAGNOSIS — M545 Low back pain, unspecified: Secondary | ICD-10-CM | POA: Diagnosis not present

## 2021-04-01 DIAGNOSIS — G894 Chronic pain syndrome: Secondary | ICD-10-CM | POA: Diagnosis not present

## 2021-05-05 DIAGNOSIS — G894 Chronic pain syndrome: Secondary | ICD-10-CM | POA: Diagnosis not present

## 2021-05-05 DIAGNOSIS — M545 Low back pain, unspecified: Secondary | ICD-10-CM | POA: Diagnosis not present

## 2021-06-02 DIAGNOSIS — R5383 Other fatigue: Secondary | ICD-10-CM | POA: Diagnosis not present

## 2021-06-02 DIAGNOSIS — G894 Chronic pain syndrome: Secondary | ICD-10-CM | POA: Diagnosis not present

## 2021-07-01 DIAGNOSIS — F17210 Nicotine dependence, cigarettes, uncomplicated: Secondary | ICD-10-CM | POA: Diagnosis not present

## 2021-07-01 DIAGNOSIS — I509 Heart failure, unspecified: Secondary | ICD-10-CM | POA: Diagnosis not present

## 2021-07-01 DIAGNOSIS — J441 Chronic obstructive pulmonary disease with (acute) exacerbation: Secondary | ICD-10-CM | POA: Diagnosis not present

## 2021-07-01 DIAGNOSIS — Z720 Tobacco use: Secondary | ICD-10-CM | POA: Diagnosis not present

## 2021-07-01 DIAGNOSIS — I25709 Atherosclerosis of coronary artery bypass graft(s), unspecified, with unspecified angina pectoris: Secondary | ICD-10-CM | POA: Diagnosis not present

## 2021-07-01 DIAGNOSIS — E782 Mixed hyperlipidemia: Secondary | ICD-10-CM | POA: Diagnosis not present

## 2021-07-01 DIAGNOSIS — M545 Low back pain, unspecified: Secondary | ICD-10-CM | POA: Diagnosis not present

## 2021-07-01 DIAGNOSIS — I25708 Atherosclerosis of coronary artery bypass graft(s), unspecified, with other forms of angina pectoris: Secondary | ICD-10-CM | POA: Diagnosis not present

## 2021-07-01 DIAGNOSIS — G894 Chronic pain syndrome: Secondary | ICD-10-CM | POA: Diagnosis not present

## 2021-07-21 DIAGNOSIS — J441 Chronic obstructive pulmonary disease with (acute) exacerbation: Secondary | ICD-10-CM | POA: Diagnosis not present

## 2021-07-21 DIAGNOSIS — I25709 Atherosclerosis of coronary artery bypass graft(s), unspecified, with unspecified angina pectoris: Secondary | ICD-10-CM | POA: Diagnosis not present

## 2021-07-29 DIAGNOSIS — D492 Neoplasm of unspecified behavior of bone, soft tissue, and skin: Secondary | ICD-10-CM | POA: Diagnosis not present

## 2021-07-29 DIAGNOSIS — L821 Other seborrheic keratosis: Secondary | ICD-10-CM | POA: Diagnosis not present

## 2021-08-26 ENCOUNTER — Other Ambulatory Visit (HOSPITAL_COMMUNITY): Payer: Self-pay | Admitting: Family Medicine

## 2021-08-26 DIAGNOSIS — E782 Mixed hyperlipidemia: Secondary | ICD-10-CM | POA: Diagnosis not present

## 2021-08-26 DIAGNOSIS — M545 Low back pain, unspecified: Secondary | ICD-10-CM | POA: Diagnosis not present

## 2021-08-26 DIAGNOSIS — Z0001 Encounter for general adult medical examination with abnormal findings: Secondary | ICD-10-CM

## 2021-08-26 DIAGNOSIS — J441 Chronic obstructive pulmonary disease with (acute) exacerbation: Secondary | ICD-10-CM | POA: Diagnosis not present

## 2021-08-26 DIAGNOSIS — I25709 Atherosclerosis of coronary artery bypass graft(s), unspecified, with unspecified angina pectoris: Secondary | ICD-10-CM | POA: Diagnosis not present

## 2021-08-26 DIAGNOSIS — G894 Chronic pain syndrome: Secondary | ICD-10-CM | POA: Diagnosis not present

## 2021-08-30 ENCOUNTER — Other Ambulatory Visit (HOSPITAL_COMMUNITY): Payer: Self-pay | Admitting: Family Medicine

## 2021-08-30 DIAGNOSIS — E782 Mixed hyperlipidemia: Secondary | ICD-10-CM | POA: Diagnosis not present

## 2021-08-30 DIAGNOSIS — E7849 Other hyperlipidemia: Secondary | ICD-10-CM | POA: Diagnosis not present

## 2021-08-30 DIAGNOSIS — I10 Essential (primary) hypertension: Secondary | ICD-10-CM | POA: Diagnosis not present

## 2021-08-30 DIAGNOSIS — Z0001 Encounter for general adult medical examination with abnormal findings: Secondary | ICD-10-CM | POA: Diagnosis not present

## 2021-08-30 DIAGNOSIS — Z1231 Encounter for screening mammogram for malignant neoplasm of breast: Secondary | ICD-10-CM

## 2021-09-05 ENCOUNTER — Other Ambulatory Visit (HOSPITAL_COMMUNITY): Payer: Medicare Other

## 2021-09-07 ENCOUNTER — Ambulatory Visit (HOSPITAL_COMMUNITY)
Admission: RE | Admit: 2021-09-07 | Discharge: 2021-09-07 | Disposition: A | Discharge status: Home / Self Care | Payer: Medicare Other | Source: Ambulatory Visit | Attending: Family Medicine | Admitting: Family Medicine

## 2021-09-07 DIAGNOSIS — Z0001 Encounter for general adult medical examination with abnormal findings: Secondary | ICD-10-CM

## 2021-09-07 DIAGNOSIS — Z1231 Encounter for screening mammogram for malignant neoplasm of breast: Secondary | ICD-10-CM | POA: Diagnosis not present

## 2021-09-07 DIAGNOSIS — Z7982 Long term (current) use of aspirin: Secondary | ICD-10-CM | POA: Diagnosis not present

## 2021-09-07 DIAGNOSIS — Z780 Asymptomatic menopausal state: Secondary | ICD-10-CM | POA: Diagnosis not present

## 2021-09-07 DIAGNOSIS — E58 Dietary calcium deficiency: Secondary | ICD-10-CM | POA: Insufficient documentation

## 2021-09-07 DIAGNOSIS — Z90711 Acquired absence of uterus with remaining cervical stump: Secondary | ICD-10-CM | POA: Insufficient documentation

## 2021-09-07 DIAGNOSIS — Z720 Tobacco use: Secondary | ICD-10-CM | POA: Diagnosis not present

## 2021-09-23 DIAGNOSIS — M545 Low back pain, unspecified: Secondary | ICD-10-CM | POA: Diagnosis not present

## 2021-09-23 DIAGNOSIS — G894 Chronic pain syndrome: Secondary | ICD-10-CM | POA: Diagnosis not present

## 2021-10-21 DIAGNOSIS — M545 Low back pain, unspecified: Secondary | ICD-10-CM | POA: Diagnosis not present

## 2021-10-21 DIAGNOSIS — G894 Chronic pain syndrome: Secondary | ICD-10-CM | POA: Diagnosis not present

## 2021-12-02 DIAGNOSIS — G894 Chronic pain syndrome: Secondary | ICD-10-CM | POA: Diagnosis not present

## 2021-12-02 DIAGNOSIS — M545 Low back pain, unspecified: Secondary | ICD-10-CM | POA: Diagnosis not present

## 2021-12-30 DIAGNOSIS — G894 Chronic pain syndrome: Secondary | ICD-10-CM | POA: Diagnosis not present

## 2022-01-23 ENCOUNTER — Encounter: Payer: Self-pay | Admitting: Internal Medicine

## 2022-01-23 ENCOUNTER — Ambulatory Visit: Payer: Medicare Other | Attending: Internal Medicine | Admitting: Internal Medicine

## 2022-01-23 VITALS — BP 150/76 | HR 65 | Ht 65.0 in | Wt 207.0 lb

## 2022-01-23 DIAGNOSIS — I340 Nonrheumatic mitral (valve) insufficiency: Secondary | ICD-10-CM

## 2022-01-23 DIAGNOSIS — I739 Peripheral vascular disease, unspecified: Secondary | ICD-10-CM | POA: Diagnosis not present

## 2022-01-23 NOTE — Addendum Note (Signed)
Addended by: Berlinda Last on: 01/23/2022 04:34 PM   Modules accepted: Orders

## 2022-01-23 NOTE — Progress Notes (Signed)
Cardiology Office Note  Date: 01/23/2022   ID: Jennifer [name], DOB 04-28-1950, MRN [id number]  PCP:  Redmond School, MD  Cardiologist:  None Electrophysiologist:  None   Reason for Office Visit: Follow-up of CAD   History of Present Illness: Jennifer [name] is a 71 y.o. female known to have CAD status post CABG in 2018 (LIMA to LAD, SVG to OM, SVG to PDA) with normal LVEF, HTN, HLD, tobacco abuse, bilateral carotid stenosis, TIA presented to cardiology clinic for follow-up visit.  Patient is here for follow-up visit with me. No interval ER visits or hospitalizations. Patient denied any rest or exertional chest discomfort, tightness, heaviness or pressure, rest or exertional dyspnea (can walk for one mile at their own pace before getting SOB), palpitations, light-headedness, syncope and LE swelling.  However patient complained of pain in her legs when she walks and had to rest to resolve the leg pain. Compliant with medications and no side-effects. No bleeding complications.    Past Medical History:  Diagnosis Date   Anemia    Anxiety    Arthritis    pt. denies   Bell's palsy    CAD (coronary artery disease)    a. Cath 12/2014 - mild-moderate non-obstructive CAD with 60% mid RCA, 50% mid Cx-distal Cx, 30% distal LAD, 25% D2, EF 55-65%. b. 02/2017: NSTEMI with cath showing 60% LM stenosis and 100% RCA stenosis --> CT Surgery consulted with CABG on 02/21/2017 (LIMA-LAD, SVG-OM, and SVG-PDA)   Depression    Dizziness    DVT (deep venous thrombosis) (HCC)    on Xarelto   Dyspnea    W/ EXERTION pt. 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 Alanda Slim   COLONOSCOPY WITH PROPOFOL N/A 02/16/2017   Procedure: COLONOSCOPY WITH PROPOFOL;  Surgeon: [name], MD;  Location: [facility]OSCOPY;  Service: Endoscopy;  Laterality: N/A;   CORONARY ARTERY BYPASS GRAFT N/A 02/21/2017   Procedure: CORONARY ARTERY BYPASS GRAFTING (CABG) times three. LIMA to LAD, SVG to OM and SVG to PDA;  Surgeon: Melrose Nakayama, MD;  Location: [place];  Service: Open Heart Surgery;  Laterality: N/A;   CORONARY STENT PLACEMENT Right 1980   ENTEROSCOPY N/A 06/14/2017   Procedure: ENTEROSCOPY;  Surgeon: [name], MD;  Location: [facility] ENDOSCOPY;  Service: Endoscopy;  Laterality: N/A;   ESOPHAGOGASTRODUODENOSCOPY (EGD) WITH PROPOFOL N/A 02/16/2017   Procedure: ESOPHAGOGASTRODUODENOSCOPY (EGD) WITH PROPOFOL;  Surgeon: [name], MD;  Location: [facility] ENDOSCOPY;  Service: Endoscopy;  Laterality: N/A;   EYE SURGERY Bilateral 06/2019   Cat Sx - Dr. Alanda Slim   GIVENS CAPSULE STUDY N/A 05/08/2017   Procedure: GIVENS CAPSULE STUDY;  Surgeon:  [name], MD;  Location: [facility] ENDOSCOPY;  Service: Endoscopy;  Laterality: N/A;   KNEE ARTHROSCOPY W/ MENISCAL REPAIR     LEFT KNEE   FORMED DVT (SURG TO REMOVE BLOOD CLOT)   LEFT HEART CATH AND CORONARY ANGIOGRAPHY N/A 02/19/2017   Procedure: LEFT HEART CATH AND CORONARY ANGIOGRAPHY;  Surgeon: Nelva Bush, MD;  Location: [place] [facility];  Service: Cardiovascular;  Laterality: N/A;   TEE WITHOUT CARDIOVERSION N/A 02/21/2017   Procedure: TRANSESOPHAGEAL ECHOCARDIOGRAM (TEE);  Surgeon: Melrose Nakayama, MD;  Location: [place];  Service: Open Heart Surgery;  Laterality: N/A;   TONSILLECTOMY       Current Outpatient Medications  Medication Sig Dispense Refill   allopurinol (ZYLOPRIM) 300 MG tablet Take 300 mg by mouth daily.     amLODipine-olmesartan (AZOR) 5-40 MG tablet Take 1 tablet by mouth daily.     APO-VARENICLINE 1 MG tablet      aspirin EC 81 MG EC tablet Take 1 tablet (81 mg total) by mouth daily.     atorvastatin (LIPITOR) 40 MG tablet Take 1.5 tablets (60 mg total) by mouth daily.     calcium carbonate (TUMS - DOSED IN MG ELEMENTAL CALCIUM) 500 MG chewable tablet Chew 2 tablets by mouth 3 (three) times daily with meals.      carvedilol (COREG) 3.125 MG tablet TAKE (1) TABLET BY MOUTH TWICE DAILY. 180 tablet 3   Cholecalciferol (VITAMIN D) 2000 units CAPS Take 2,000 Units by mouth daily.     clobetasol ointment (TEMOVATE) 0.05 % SMARTSIG:Sparingly Topical Twice Daily     DULoxetine (CYMBALTA) 20 MG capsule Take 20 mg by mouth 2 (two) times daily.     FeFum-FePoly-FA-B Cmp-C-Biot (INTEGRA PLUS) CAPS Take 1 capsule daily with a meal 30 capsule 3   gabapentin (NEURONTIN) 600 MG tablet Take 300-600 mg by mouth See admin instructions. Take 300 mg twice daily IN THE MORNING AND AT NOON AND  600 mg at bedtime.     lisinopril (ZESTRIL) 40 MG tablet TAKE ONE TABLET BY MOUTH EVERY MORNING 90 tablet 2   Omega-3 Fatty Acids (FISH OIL) 1360 MG CAPS Take by mouth.     rivaroxaban (XARELTO) 20 MG TABS tablet Take 20 mg by mouth daily with supper.     Oxycodone HCl 10 MG TABS Take 10 mg by mouth 3 (three) times daily as needed. For pain (Patient not taking: Reported on 01/23/2022)     No current facility-administered medications for this visit.   Allergies:  Bee venom   Social History: The patient  reports that she has been smoking cigarettes. She started smoking about 54 years ago. She has a 12.50 pack-year smoking history. She has never used smokeless tobacco. She reports that she does not drink alcohol and does not use drugs.   Family History: The patient's family history includes  AAA (abdominal aortic aneurysm) in her mother; Dementia in her paternal grandmother; Heart attack in her brother, brother, brother, father, and mother; Heart disease in her brother, father, and maternal grandfather; Ovarian cancer in her maternal grandmother.   ROS:  Please see the history of present illness. Otherwise, complete review of systems is positive for none.  All other systems are reviewed and negative.   Physical Exam: VS:  BP (!) 150/76   Pulse 65   Ht '5\' 5"'$  (1.651 m)   Wt 207 lb (93.9 kg)   SpO2 99%   BMI 34.45 kg/m , BMI Body mass index is 34.45 kg/m.  Wt Readings from Last 3 Encounters:  01/23/22 207 lb (93.9 kg)  06/11/20 233 lb 4.8 oz (105.8 kg)  02/20/20 232 lb (105.2 kg)    General: Patient appears comfortable at rest. HEENT: Conjunctiva and lids normal, oropharynx clear with moist mucosa. Neck: Supple, no elevated JVP or carotid bruits, no thyromegaly. Lungs: Clear to auscultation, nonlabored breathing at rest. Cardiac: Regular rate and rhythm, no S3 or significant systolic murmur, no pericardial rub. Abdomen: Soft, nontender, no hepatomegaly, bowel sounds present, no guarding or rebound. Extremities: No pitting edema, R DP dopplerable and L DP absent Skin: Warm and dry. Musculoskeletal: No kyphosis. Neuropsychiatric: Alert and oriented x3, affect grossly appropriate.  Recent Labwork: No results found for requested labs within last 365 days.     Component Value Date/Time   CHOL 129 02/14/2017 0841   TRIG 162 (H) 02/14/2017 0841   HDL 30 (L) 02/14/2017 0841   CHOLHDL 4.3 02/14/2017 0841   VLDL 32 02/14/2017 0841   LDLCALC 67 02/14/2017 0841    Other Studies Reviewed Today: Echo from 2018 LVEF 60 to 65% Mild MR  LHC from 2018 Significant multivessel coronary artery disease, including 60% ostial LMCA stenosis with catheter dampening, 70% dominant OM stenosis, and subacute versus chronic occlusion of the mid RCA with faint bridging and left-to-right  collaterals. Normal left ventricular contraction with mildly elevated filling pressure.  Assessment and Plan: Patient is a 71 year old F known to have CAD status post CABG in 2018 (LIMA to LAD, SVG to OM, SVG to PDA) with normal LVEF, HTN, HLD, tobacco abuse, bilateral carotid stenosis, TIA presented to cardiology clinic for follow-up visit.  #CAD status post CABG in 2018 (LIMA to LAD, SVG to OM, SVG to PDA) with normal LVEF, currently angina free -Continue aspirin 81 mg once daily -Continue atorvastatin 40 mg nightly -Continue carvedilol 3.125 mg twice daily -Continue lisinopril 40 mg once daily -SL NTG 0.4 mg. -ER precautions for chest pain provided  #HLD, unknown values -Continue atorvastatin 40 mg nightly.  Obtain lipid panel.  LDL goal less than 70.  #Claudication of lower extremities (R DP dopplerable, L DP absent) -Obtain ultrasound arterial lower extremities with ABI  #HTN, partially controlled -Continue carvedilol 3.125 mg twice daily -Continue lisinopril 40 mg once daily -Patient stated she has a lot of stress going on at her house right now and she had to tell her son to move out due to him being on drugs.  #Carotid artery stenosis -There is total occlusion of R ICA.  The CCA appears occluded as well.  Left mid ICA has 40 to 59% stenosis. Continue aspirin 81 mg once daily and high intensity statin, atorvastatin 40 mg nightly.  Follow-up with vascular surgery.  I have spent a total of 36 minutes with patient reviewing chart , telemetry, EKGs, labs and examining patient as well as establishing an assessment and plan that was discussed with the patient.  > 50% of time was spent in direct patient care.     Medication Adjustments/Labs and Tests Ordered: Current medicines are reviewed at length with the patient today.  Concerns regarding medicines are outlined above.   Tests Ordered: No orders of the defined types were placed in this encounter.   Medication Changes: No  orders of the defined types were placed in this encounter.   Disposition:  Follow up  3 months  Signed, Gwenevere Goga Fidel Levy, MD, 01/23/2022 3:41 PM    [facility] at [address],  [place], [place] [address]

## 2022-01-23 NOTE — Patient Instructions (Signed)
Medication Instructions:  Your physician recommends that you continue on your current medications as directed. Please refer to the Current Medication list given to you today.   Labwork: None  Testing/Procedures: Your physician has requested that you have an echocardiogram. Echocardiography is a painless test that uses sound waves to create images of your heart. It provides your doctor with information about the size and shape of your heart and how well your heart's chambers and valves are working. This procedure takes approximately one hour. There are no restrictions for this procedure. Please do NOT wear cologne, perfume, aftershave, or lotions (deodorant is allowed). Please arrive 15 minutes prior to your appointment time.  Your physician has requested that you have an ankle brachial index (ABI). During this test an ultrasound and blood pressure cuff are used to evaluate the arteries that supply the arms and legs with blood. Allow thirty minutes for this exam. There are no restrictions or special instructions.   Follow-Up: Follow up with Dr. Dellia Cloud in 3 months.   Any Other Special Instructions Will Be Listed Below (If Applicable).     If you need a refill on your cardiac medications before your next appointment, please call your pharmacy.

## 2022-01-27 ENCOUNTER — Ambulatory Visit (HOSPITAL_COMMUNITY)
Admission: RE | Admit: 2022-01-27 | Discharge: 2022-01-27 | Disposition: A | Discharge status: Home / Self Care | Payer: Medicare Other | Source: Ambulatory Visit | Attending: Internal Medicine | Admitting: Internal Medicine

## 2022-01-27 DIAGNOSIS — I739 Peripheral vascular disease, unspecified: Secondary | ICD-10-CM | POA: Insufficient documentation

## 2022-01-27 DIAGNOSIS — I743 Embolism and thrombosis of arteries of the lower extremities: Secondary | ICD-10-CM | POA: Diagnosis not present

## 2022-01-27 DIAGNOSIS — I70213 Atherosclerosis of native arteries of extremities with intermittent claudication, bilateral legs: Secondary | ICD-10-CM | POA: Diagnosis not present

## 2022-01-31 ENCOUNTER — Telehealth: Payer: Self-pay

## 2022-01-31 DIAGNOSIS — I739 Peripheral vascular disease, unspecified: Secondary | ICD-10-CM

## 2022-01-31 NOTE — Telephone Encounter (Signed)
-----   Message from Chalmers Guest, MD sent at 01/27/2022  4:18 PM EST ----- Patient has evidence of poor circulation in her lower extremities, more so in the right lower extremity compared to the left lower extremity. Recommend patient to keep walking for the circulation to improve in the legs. We will obtain ultrasound arterial Doppler of the lower extremities in the meantime.

## 2022-01-31 NOTE — Telephone Encounter (Signed)
Patient notified and verbalized understanding. Patient had no questions or concerns at this time. PCP copied 

## 2022-02-01 ENCOUNTER — Ambulatory Visit (HOSPITAL_COMMUNITY)
Admission: RE | Admit: 2022-02-01 | Discharge: 2022-02-01 | Disposition: A | Discharge status: Home / Self Care | Payer: Medicare Other | Source: Ambulatory Visit | Attending: Internal Medicine | Admitting: Internal Medicine

## 2022-02-01 DIAGNOSIS — I340 Nonrheumatic mitral (valve) insufficiency: Secondary | ICD-10-CM | POA: Diagnosis not present

## 2022-02-01 LAB — ECHOCARDIOGRAM COMPLETE
AR max vel: 1.72 cm2
AV Area VTI: 1.8 cm2
AV Area mean vel: 1.63 cm2
AV Mean grad: 14 mmHg
AV Peak grad: 25 mmHg
Ao pk vel: 2.5 m/s
Area-P 1/2: 1.99 cm2
MV VTI: 2.64 cm2
S' Lateral: 2.8 cm

## 2022-02-01 NOTE — Progress Notes (Signed)
*  PRELIMINARY RESULTS* Echocardiogram 2D Echocardiogram has been performed.  Jennifer [name] 02/01/2022, 2:13 PM

## 2022-02-06 ENCOUNTER — Ambulatory Visit (HOSPITAL_COMMUNITY)
Admission: RE | Admit: 2022-02-06 | Discharge: 2022-02-06 | Disposition: A | Discharge status: Home / Self Care | Payer: Medicare Other | Source: Ambulatory Visit | Attending: Internal Medicine | Admitting: Internal Medicine

## 2022-02-06 DIAGNOSIS — I739 Peripheral vascular disease, unspecified: Secondary | ICD-10-CM | POA: Insufficient documentation

## 2022-02-06 DIAGNOSIS — I70211 Atherosclerosis of native arteries of extremities with intermittent claudication, right leg: Secondary | ICD-10-CM | POA: Diagnosis not present

## 2022-02-08 ENCOUNTER — Telehealth: Payer: Self-pay

## 2022-02-08 DIAGNOSIS — I739 Peripheral vascular disease, unspecified: Secondary | ICD-10-CM

## 2022-02-08 NOTE — Telephone Encounter (Signed)
Patient notified and verbalized understanding. Patient had no questions or concerns at this time. PCP copied 

## 2022-02-08 NOTE — Telephone Encounter (Signed)
-----   Message from Chalmers Guest, MD sent at 02/07/2022  6:29 PM EST ----- USG arterial LE noted blockages in the right leg. Recommend placing peripheral vascular consult.

## 2022-02-08 NOTE — Addendum Note (Signed)
Addended by: Christella Scheuermann C on: 02/08/2022 10:57 AM   Modules accepted: Orders

## 2022-02-10 DIAGNOSIS — I25709 Atherosclerosis of coronary artery bypass graft(s), unspecified, with unspecified angina pectoris: Secondary | ICD-10-CM | POA: Diagnosis not present

## 2022-02-10 DIAGNOSIS — L02211 Cutaneous abscess of abdominal wall: Secondary | ICD-10-CM | POA: Diagnosis not present

## 2022-02-10 DIAGNOSIS — G894 Chronic pain syndrome: Secondary | ICD-10-CM | POA: Diagnosis not present

## 2022-02-16 ENCOUNTER — Telehealth: Payer: Self-pay | Admitting: Internal Medicine

## 2022-02-16 NOTE — Telephone Encounter (Signed)
Patient was returning call for results. Please advise °

## 2022-02-16 NOTE — Telephone Encounter (Signed)
Mallipeddi, Vishnu P, MD  Christella Scheuermann C, CMA Normal pumping function of the heart and mild abnormal relaxation of the heart. There is mild aortic valve stenosis. We will repeat the Echo in 3 years to monitor the progression of aortic valve stenosis.        Results discussed with patient,copied pcp

## 2022-02-21 ENCOUNTER — Encounter: Payer: Self-pay | Admitting: Cardiovascular Disease

## 2022-02-21 ENCOUNTER — Ambulatory Visit: Payer: Medicare Other | Attending: Cardiovascular Disease | Admitting: Cardiovascular Disease

## 2022-02-21 VITALS — BP 158/80 | HR 70 | Ht 65.0 in | Wt 203.0 lb

## 2022-02-21 DIAGNOSIS — I739 Peripheral vascular disease, unspecified: Secondary | ICD-10-CM | POA: Diagnosis not present

## 2022-02-21 MED ORDER — CILOSTAZOL 50 MG PO TABS: 50.0000 mg | ORAL_TABLET | Freq: Two times a day (BID) | ORAL | 2 refills | Status: DC

## 2022-02-21 NOTE — Patient Instructions (Signed)
Medication Instructions:  Your physician has recommended you make the following change in your medication:   -Start cilostazol (pletal) '50mg'$  twice daily.  *If you need a refill on your cardiac medications before your next appointment, please call your pharmacy*   Testing/Procedures: Your physician has requested that you have a lower extremity arterial duplex. During this test, ultrasound is used to evaluate arterial blood flow in the legs. Allow one hour for this exam. There are no restrictions or special instructions. This will take place at [place], [address].  Your physician has requested that you have an ankle brachial index (ABI). During this test an ultrasound and blood pressure cuff are used to evaluate the arteries that supply the arms and legs with blood. Allow thirty minutes for this exam. There are no restrictions or special instructions. This will take place at [place], [address].    Follow-Up: At [facility], you and your health needs are our priority.  As part of our continuing mission to provide you with exceptional heart care, we have created designated Provider Care Teams.  These Care Teams include your primary Cardiologist (physician) and Advanced Practice Providers (APPs -  Physician Assistants and Nurse Practitioners) who all work together to provide you with the care you need, when you need it.  We recommend signing up for the patient portal called "MyChart".  Sign up information is provided on this After Visit Summary.  MyChart is used to connect with patients for Virtual Visits (Telemedicine).  Patients are able to view lab/test results, encounter notes, upcoming appointments, etc.  Non-urgent messages can be sent to your provider as well.   To learn more about what you can do with MyChart, go to [email or web address].    Your next appointment:   3 month(s)  The format for your next appointment:   In Person  Provider:   Quay Burow, MD

## 2022-02-21 NOTE — Assessment & Plan Note (Signed)
Jennifer [name] was referred to me by Dr. Dellia Cloud for evaluation of claudication.  She does have a history of CAD status post bypass grafting as well as multiple cardiovascular risk factors including ongoing tobacco abuse.  She has had DVT and pulmonary embolism in the past on Xarelto.  She complains of left greater than right lower extremity claudication which is lifestyle limiting.  ABIs performed/1/22 were normal at that time.  Recent Dopplers performed [facility] appear to show an occluded right SFA with a widely patent left.  She complains of left greater than right lower extremity claudication.  I cannot palpate pedal pulses.  I am going to recheck lower extremity arterial Doppler studies and empirically begin her on Pletal 50 mg p.o. twice daily.  I will have her come back in 3 months after which we will reevaluate.  If she does not have any improvement in claudication symptoms we will consider a more invasive approach.

## 2022-02-21 NOTE — Progress Notes (Signed)
02/21/2022 Jennifer [name]   March 01, 1951  [id number]  Primary Physician Redmond School, MD Primary Cardiologist: Claudina Lick MD Peripheral vascular cardiologist-Tumeka Chimenti Adora Fridge MD  HPI:  Jennifer [name] is a 71 y.o. moderately overweight divorced and widowed Caucasian female mother of 2 children, grandmother of 3 grandchildren who is accompanied by one of her sons Jennifer [name]  today.  She was referred by her primary cardiologist, Dr.Mallipeddi for peripheral vascular valuation because of lifestyle-limiting claudication.  She worked on her tobacco form in her early years.  Risk factors include 45-pack-year tobacco abuse continue to smoke 1 pack/day, treated hypertension and hyperlipidemia.  She has strong family history for heart disease with both parents and brother all of whom have had ischemic heart disease.  She had a non-STEMI 12/18 and had CABG at that time.  She has had left lower extremity DVT and pulmonary emboli on Xarelto.  She complains of left greater than right lower extremity claudication.  She did have Doppler studies performed 06/11/2020 that showed normal ABIs bilaterally and lower extremity arterial Doppler studies performed at [facility] 02/06/2022 that suggested an occluded right SFA.   Current Meds  Medication Sig   allopurinol (ZYLOPRIM) 300 MG tablet Take 300 mg by mouth daily.   amLODipine-olmesartan (AZOR) 5-40 MG tablet Take 1 tablet by mouth daily.   APO-VARENICLINE 1 MG tablet    aspirin EC 81 MG EC tablet Take 1 tablet (81 mg total) by mouth daily.   atorvastatin (LIPITOR) 40 MG tablet Take 1.5 tablets (60 mg total) by mouth daily.   calcium carbonate (TUMS - DOSED IN MG ELEMENTAL CALCIUM) 500 MG chewable tablet Chew 2 tablets by mouth 3 (three) times daily with meals.    carvedilol (COREG) 3.125 MG tablet TAKE (1) TABLET BY MOUTH TWICE DAILY.   Cholecalciferol (VITAMIN D) 2000 units CAPS Take 2,000 Units by mouth daily.   clobetasol ointment (TEMOVATE)  0.05 % SMARTSIG:Sparingly Topical Twice Daily   DULoxetine (CYMBALTA) 20 MG capsule Take 20 mg by mouth 2 (two) times daily.   FeFum-FePoly-FA-B Cmp-C-Biot (INTEGRA PLUS) CAPS Take 1 capsule daily with a meal   gabapentin (NEURONTIN) 600 MG tablet Take 300-600 mg by mouth See admin instructions. Take 300 mg twice daily IN THE MORNING AND AT NOON AND  600 mg at bedtime.   lisinopril (ZESTRIL) 40 MG tablet TAKE ONE TABLET BY MOUTH EVERY MORNING   Omega-3 Fatty Acids (FISH OIL) 1360 MG CAPS Take by mouth.   Oxycodone HCl 10 MG TABS Take 10 mg by mouth 3 (three) times daily as needed. For pain   rivaroxaban (XARELTO) 20 MG TABS tablet Take 20 mg by mouth daily with supper.     Allergies  Allergen Reactions   Bee Venom Anaphylaxis    Social History   Socioeconomic History   Marital status: Single    Spouse name: Not on file   Number of children: 2   Years of education: 53   Highest education level: Not on file  Occupational History   Occupation: Retired  Tobacco Use   Smoking status: Every Day    Packs/day: 0.25    Years: 50.00    Total pack years: 12.50    Types: Cigarettes    Start date: 06/18/1967   Smokeless tobacco: Never   Tobacco comments:    3-4 a day  Vaping Use   Vaping Use: Never used  Substance and Sexual Activity   Alcohol use: No    Alcohol/week:  0.0 standard drinks of alcohol   Drug use: No   Sexual activity: Not Currently  Other Topics Concern   Not on file  Social History Narrative   Lives at home alone.   Right-handed.   2 cups caffeine per day.   Social Determinants of Health   Financial Resource Strain: Not on file  Food Insecurity: Not on file  Transportation Needs: Not on file  Physical Activity: Not on file  Stress: Not on file  Social Connections: Not on file  Intimate Partner Violence: Not on file     Review of Systems: General: negative for chills, fever, night sweats or weight changes.  Cardiovascular: negative for chest pain, dyspnea  on exertion, edema, orthopnea, palpitations, paroxysmal nocturnal dyspnea or shortness of breath Dermatological: negative for rash Respiratory: negative for cough or wheezing Urologic: negative for hematuria Abdominal: negative for nausea, vomiting, diarrhea, bright red blood per rectum, melena, or hematemesis Neurologic: negative for visual changes, syncope, or dizziness All other systems reviewed and are otherwise negative except as noted above.    Blood pressure (!) 158/80, pulse 70, height '5\' 5"'$  (1.651 m), weight 203 lb (92.1 kg).  General appearance: alert and no distress Neck: no adenopathy, no JVD, supple, symmetrical, trachea midline, thyroid not enlarged, symmetric, no tenderness/mass/nodules, and bilateral carotid bruits right louder than the left Lungs: clear to auscultation bilaterally Heart: Soft outflow tract murmur Extremities: extremities normal, atraumatic, no cyanosis or edema Pulses: Absent pedal pulses Skin: Skin color, texture, turgor normal. No rashes or lesions Neurologic: Grossly normal  EKG not performed today   ASSESSMENT AND PLAN:   PVD (peripheral vascular disease) ([place]) Jennifer [name] was referred to me by Dr. Dellia Cloud for evaluation of claudication.  She does have a history of CAD status post bypass grafting as well as multiple cardiovascular risk factors including ongoing tobacco abuse.  She has had DVT and pulmonary embolism in the past on Xarelto.  She complains of left greater than right lower extremity claudication which is lifestyle limiting.  ABIs performed/1/22 were normal at that time.  Recent Dopplers performed [facility] hospital appear to show an occluded right SFA with a widely patent left.  She complains of left greater than right lower extremity claudication.  I cannot palpate pedal pulses.  I am going to recheck lower extremity arterial Doppler studies and empirically begin her on Pletal 50 mg p.o. twice daily.  I will have her come back in 3 months  after which we will reevaluate.  If she does not have any improvement in claudication symptoms we will consider a more invasive approach.     Lorretta Harp MD FACP,FACC,FAHA, [facility] 02/21/2022 9:03 AM

## 2022-02-23 ENCOUNTER — Ambulatory Visit (HOSPITAL_COMMUNITY)
Admission: RE | Admit: 2022-02-23 | Discharge: 2022-02-23 | Disposition: A | Discharge status: Home / Self Care | Payer: Medicare Other | Source: Ambulatory Visit | Attending: Cardiovascular Disease | Admitting: Cardiovascular Disease

## 2022-02-23 DIAGNOSIS — I739 Peripheral vascular disease, unspecified: Secondary | ICD-10-CM | POA: Insufficient documentation

## 2022-03-16 DIAGNOSIS — G894 Chronic pain syndrome: Secondary | ICD-10-CM | POA: Diagnosis not present

## 2022-03-16 DIAGNOSIS — I10 Essential (primary) hypertension: Secondary | ICD-10-CM | POA: Diagnosis not present

## 2022-03-31 ENCOUNTER — Ambulatory Visit: Payer: Medicare Other | Admitting: Internal Medicine

## 2022-04-14 DIAGNOSIS — J069 Acute upper respiratory infection, unspecified: Secondary | ICD-10-CM | POA: Diagnosis not present

## 2022-04-14 DIAGNOSIS — J441 Chronic obstructive pulmonary disease with (acute) exacerbation: Secondary | ICD-10-CM | POA: Diagnosis not present

## 2022-04-14 DIAGNOSIS — G894 Chronic pain syndrome: Secondary | ICD-10-CM | POA: Diagnosis not present

## 2022-04-20 ENCOUNTER — Encounter: Payer: Self-pay | Admitting: Internal Medicine

## 2022-04-20 ENCOUNTER — Ambulatory Visit: Payer: Medicare Other | Attending: Internal Medicine | Admitting: Internal Medicine

## 2022-04-20 VITALS — BP 158/86 | HR 84 | Ht 63.0 in | Wt 196.0 lb

## 2022-04-20 DIAGNOSIS — I739 Peripheral vascular disease, unspecified: Secondary | ICD-10-CM

## 2022-04-20 DIAGNOSIS — I70211 Atherosclerosis of native arteries of extremities with intermittent claudication, right leg: Secondary | ICD-10-CM | POA: Diagnosis not present

## 2022-04-20 NOTE — Progress Notes (Signed)
Cardiology Office Note  Date: 04/20/2022   ID: Jennifer [name], DOB April 26, 1950, MRN [id number]  PCP:  Redmond School, MD  Cardiologist:  Chalmers Guest, MD Electrophysiologist:  None   Reason for Office Visit: Follow-up of CAD and PAD   History of Present Illness: Jennifer [name] is a 72 y.o. female known to have CAD status post CABG in 2018 (LIMA to LAD, SVG to OM, SVG to PDA) with normal LVEF, HTN, HLD, tobacco abuse, bilateral carotid stenosis, TIA, lower extremity PAD (moderate R PAD, mild L PAD presented to cardiology clinic for follow-up visit.  Patient underwent ABI that showed moderate R PAD and mild L PAD. She was referred to peripheral vascular medicine who placed her on Pletal 50 mg twice daily after which her walking distance improved from 25 to 50 feet after which she experiences claudication symptoms. Otherwise denied any angina, SOB, dizziness, lightheadedness, syncope and palpitations. She is currently undergoing a lot of stress in her family and her living situation. She was living in a rental for the last 16 years and her new landlord increase the rent by $70 per month and does not care about her living situation at all.  She has cut back on smoking cigarettes quite a bit, smokes 1 pack per every 2-2 1/2 days.  Tried Chantix did not work for her, it made her anxious and had nightmares.  Stop Chantix.  She is currently trying nicotine gums which her oldest son offered.   Past Medical History:  Diagnosis Date   Anemia    Anxiety    Arthritis    pt. denies   Bell's palsy    CAD (coronary artery disease)    a. Cath 12/2014 - mild-moderate non-obstructive CAD with 60% mid RCA, 50% mid Cx-distal Cx, 30% distal LAD, 25% D2, EF 55-65%. b. 02/2017: NSTEMI with cath showing 60% LM stenosis and 100% RCA stenosis --> CT Surgery consulted with CABG on 02/21/2017 (LIMA-LAD, SVG-OM, and SVG-PDA)   Depression    Dizziness    DVT (deep venous thrombosis) (HCC)    on Xarelto    Dyspnea    W/ EXERTION pt. 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 Alanda Slim   COLONOSCOPY WITH PROPOFOL N/A 02/16/2017   Procedure: COLONOSCOPY WITH PROPOFOL;  Surgeon: [name], MD;  Location: [facility] ENDOSCOPY;  Service: Endoscopy;  Laterality: N/A;   CORONARY ARTERY BYPASS GRAFT N/A 02/21/2017   Procedure: CORONARY ARTERY BYPASS GRAFTING (CABG) times three. LIMA to LAD, SVG to OM and SVG to PDA;  Surgeon: Melrose Nakayama, MD;  Location: [facility];  Service: Open Heart Surgery;  Laterality: N/A;   CORONARY STENT PLACEMENT  Right 1980   ENTEROSCOPY N/A 06/14/2017   Procedure: ENTEROSCOPY;  Surgeon: [name], MD;  Location: [facility];  Service: Endoscopy;  Laterality: N/A;   ESOPHAGOGASTRODUODENOSCOPY (EGD) WITH PROPOFOL N/A 02/16/2017   Procedure: ESOPHAGOGASTRODUODENOSCOPY (EGD) WITH PROPOFOL;  Surgeon: [name], MD;  Location: [facility] END[facility]COPY;  Service: Endoscopy;  Laterality: N/A;   EYE SURGERY Bilateral 06/2019   Cat Sx - Dr. Alanda Slim   GIVENS CAPSULE STUDY N/A 05/08/2017   Procedure: GIVENS CAPSULE STUDY;  Surgeon: [name], MD;  Location: [facility] ENDOSCOPY;  Service: Endoscopy;  Laterality: N/A;   KNEE ARTHROSCOPY W/ MENISCAL REPAIR     LEFT KNEE   FORMED DVT (SURG TO REMOVE BLOOD CLOT)   LEFT HEART CATH  AND CORONARY ANGIOGRAPHY N/A 02/19/2017   Procedure: LEFT HEART CATH AND CORONARY ANGIOGRAPHY;  Surgeon: Nelva Bush, MD;  Location: [facility];  Service: Cardiovascular;  Laterality: N/A;   TEE WITHOUT CARDIOVERSION N/A 02/21/2017   Procedure: TRANSESOPHAGEAL ECHOCARDIOGRAM (TEE);  Surgeon: Melrose Nakayama, MD;  Location: [place];  Service: Open Heart Surgery;  Laterality: N/A;   TONSILLECTOMY      Current Outpatient Medications  Medication Sig Dispense Refill   albuterol (VENTOLIN HFA) 108 (90 Base) MCG/ACT inhaler Inhale into the lungs.     allopurinol (ZYLOPRIM) 300 MG tablet Take 300 mg by mouth daily.     amLODipine-olmesartan (AZOR) 5-40 MG tablet Take 1 tablet by mouth daily.     APO-VARENICLINE 1 MG tablet      aspirin EC 81 MG EC tablet Take 1 tablet (81 mg total) by mouth daily.     atorvastatin (LIPITOR) 40 MG tablet Take 1.5 tablets (60 mg total) by mouth daily.     calcium carbonate (TUMS - DOSED IN MG ELEMENTAL CALCIUM) 500 MG chewable tablet Chew 2 tablets by mouth 3 (three) times daily with meals.      carvedilol (COREG) 3.125 MG tablet TAKE (1) TABLET BY MOUTH TWICE DAILY. 180 tablet 3   Cholecalciferol (VITAMIN D) 2000 units CAPS Take 2,000 Units by mouth daily.     cilostazol (PLETAL) 50 MG tablet Take 1 tablet (50 mg total) by mouth 2 (two) times daily. 180 tablet 2   clobetasol ointment (TEMOVATE) 0.05 % SMARTSIG:Sparingly Topical Twice Daily     DULoxetine (CYMBALTA) 20 MG capsule Take 20 mg by mouth 2 (two) times daily.     FeFum-FePoly-FA-B Cmp-C-Biot (INTEGRA PLUS) CAPS Take 1 capsule daily with a meal 30 capsule 3   gabapentin (NEURONTIN) 600 MG tablet Take 300-600 mg by mouth See admin instructions. Take 300 mg twice daily IN THE MORNING AND AT NOON AND  600 mg at bedtime.     lisinopril (ZESTRIL) 40 MG tablet TAKE ONE TABLET BY MOUTH EVERY MORNING 90 tablet 2   Omega-3 Fatty Acids (FISH OIL) 1360 MG CAPS Take by mouth.     Oxycodone HCl 10 MG  TABS Take 10 mg by mouth 3 (three) times daily as needed. For pain     rivaroxaban (XARELTO) 20 MG TABS tablet Take 20 mg by mouth daily with supper.     No current facility-administered medications for this visit.   Allergies:  Bee venom   Social History: The patient  reports that she has been smoking cigarettes. She started smoking about 54 years ago. She has a 12.50 pack-year smoking history. She has never used smokeless tobacco. She reports that she does not drink alcohol and does not use drugs.  Family History: The patient's family history includes AAA (abdominal aortic aneurysm) in her mother; Dementia in her paternal grandmother; Heart attack in her brother, brother, brother, father, and mother; Heart disease in her brother, father, and maternal grandfather; Ovarian cancer in her maternal grandmother.   ROS:  Please see the history of present illness. Otherwise, complete review of systems is positive for none.  All other systems are reviewed and negative.   Physical Exam: VS:  BP (!) 158/86   Pulse 84   Ht '5\' 3"'$  (1.6 m)   Wt 196 lb (88.9 kg)   SpO2 95%   BMI 34.72 kg/m , BMI Body mass index is 34.72 kg/m.  Wt Readings from Last 3 Encounters:  04/20/22 196 lb (88.9 kg)  02/21/22 203 lb (92.1 kg)  01/23/22 207 lb (93.9 kg)    General: Patient appears comfortable at rest. HEENT: Conjunctiva and lids normal, oropharynx clear with moist mucosa. Neck: Supple, no elevated JVP or carotid bruits, no thyromegaly. Lungs: Clear to auscultation, nonlabored breathing at rest. Cardiac: Regular rate and rhythm, no S3 or significant systolic murmur, no pericardial rub. Abdomen: Soft, nontender, no hepatomegaly, bowel sounds present, no guarding or rebound. Extremities: No pitting edema, R DP dopplerable and L DP absent Skin: Warm and dry. Musculoskeletal: No kyphosis. Neuropsychiatric: Alert and oriented x3, affect grossly appropriate.  Recent Labwork: No results found for requested  labs within last 365 days.     Component Value Date/Time   CHOL 129 02/14/2017 0841   TRIG 162 (H) 02/14/2017 0841   HDL 30 (L) 02/14/2017 0841   CHOLHDL 4.3 02/14/2017 0841   VLDL 32 02/14/2017 0841   LDLCALC 67 02/14/2017 0841    Other Studies Reviewed Today: Echo from 2018 LVEF 60 to 65% Mild MR  LHC from 2018 Significant multivessel coronary artery disease, including 60% ostial LMCA stenosis with catheter dampening, 70% dominant OM stenosis, and subacute versus chronic occlusion of the mid RCA with faint bridging and left-to-right collaterals. Normal left ventricular contraction with mildly elevated filling pressure.  Assessment and Plan: Patient is a 72 year old F known to have CAD status post CABG in 2018 (LIMA to LAD, SVG to OM, SVG to PDA) with normal LVEF, HTN, HLD, tobacco abuse, bilateral carotid stenosis, TIA presented to cardiology clinic for follow-up visit.  #CAD status post CABG in 2018 (LIMA to LAD, SVG to OM, SVG to PDA) with normal LVEF, currently angina free -Continue aspirin 81 mg once daily -Continue atorvastatin 40 mg nightly -Continue carvedilol 3.125 mg twice daily -Continue lisinopril 40 mg once daily -SL NTG 0.4 mg. -ER precautions for chest pain provided  #HLD, unknown values -Continue atorvastatin 40 mg nightly.  Goal LDL less than 55.  # Peripheral artery disease of lower extremities (moderate R PAD and mild L PAD) -Continue aspirin 81 mg once daily -Continue atorvastatin 40 mg nightly -Continue carvedilol 3.125 mg twice daily -Continue lisinopril 40 mg once daily -Continue Pletal 50 mg twice daily -Walking distance improved from 20 feet to 50 feet after starting Pletal 50 mg twice daily.  I discussed the benefits of supervised exercise therapy for PAD with the patient including increased quality of life through improvement in the walking distance and decreased claudication symptoms.  I discussed the risk factor modification of PAD including  smoking cessation which the patient is actively cutting back on smoking cigarettes. She is currently on cardio prudent medications and GDMT for PAD. Place referral to supervised exercise therapy for PAD.  #HTN, partially controlled -  Continue carvedilol 3.125 mg twice daily -Continue lisinopril 40 mg once daily -Patient stated she has a lot of stress going on at her house right now regarding her living situation, rent increased by $70 per month and new landlord does not care.  #Carotid artery stenosis -There is total occlusion of R ICA.  The CCA appears occluded as well.  Left mid ICA has 40 to 59% stenosis. Continue aspirin 81 mg once daily and high intensity statin, atorvastatin 40 mg nightly.  Follow-up with vascular surgery.   # Nicotine abuse -Patient has cut back on smoking cigarettes significantly. She currently smokes 1 pack for every 2 or 2 and half days. She is motivated to quit. Try Chantix and had anxiety/nightmares after which she stopped the medication. Her older son gave her nicotine gums which she is trying right now.  I have spent a total of 33 minutes with patient reviewing chart , telemetry, EKGs, labs and examining patient as well as establishing an assessment and plan that was discussed with the patient.  > 50% of time was spent in direct patient care.     Medication Adjustments/Labs and Tests Ordered: Current medicines are reviewed at length with the patient today.  Concerns regarding medicines are outlined above.   Tests Ordered: No orders of the defined types were placed in this encounter.  Orders Placed This Encounter  Procedures   AMB referral for PAD/Supervised Exercise Therapy (SET)    Medication Changes: No orders of the defined types were placed in this encounter.   Disposition:  Follow up  6 months  Signed, Abyan Cadman Fidel Levy, MD, 04/20/2022 1:36 PM    [facility] at [facility] [address], [place], [place] [address]

## 2022-04-20 NOTE — Patient Instructions (Signed)
Medication Instructions:  Your physician recommends that you continue on your current medications as directed. Please refer to the Current Medication list given to you today.   Labwork: None  Testing/Procedures: None  Follow-Up: Follow up with Dr. Dellia Cloud in 6 months.   Any Other Special Instructions Will Be Listed Below (If Applicable).     If you need a refill on your cardiac medications before your next appointment, please call your pharmacy.

## 2022-05-02 ENCOUNTER — Other Ambulatory Visit: Payer: Self-pay

## 2022-05-02 DIAGNOSIS — I70211 Atherosclerosis of native arteries of extremities with intermittent claudication, right leg: Secondary | ICD-10-CM

## 2022-05-02 DIAGNOSIS — I739 Peripheral vascular disease, unspecified: Secondary | ICD-10-CM

## 2022-05-03 NOTE — Addendum Note (Signed)
Addended by: Berlinda Last on: 05/03/2022 09:38 AM   Modules accepted: Orders

## 2022-05-12 DIAGNOSIS — G894 Chronic pain syndrome: Secondary | ICD-10-CM | POA: Diagnosis not present

## 2022-05-12 DIAGNOSIS — M5136 Other intervertebral disc degeneration, lumbar region: Secondary | ICD-10-CM | POA: Diagnosis not present

## 2022-05-31 ENCOUNTER — Ambulatory Visit: Payer: Medicare Other | Admitting: Cardiovascular Disease

## 2022-06-15 DIAGNOSIS — I25709 Atherosclerosis of coronary artery bypass graft(s), unspecified, with unspecified angina pectoris: Secondary | ICD-10-CM | POA: Diagnosis not present

## 2022-06-15 DIAGNOSIS — M5136 Other intervertebral disc degeneration, lumbar region: Secondary | ICD-10-CM | POA: Diagnosis not present

## 2022-06-15 DIAGNOSIS — Z7901 Long term (current) use of anticoagulants: Secondary | ICD-10-CM | POA: Diagnosis not present

## 2022-06-15 DIAGNOSIS — G894 Chronic pain syndrome: Secondary | ICD-10-CM | POA: Diagnosis not present

## 2022-06-15 DIAGNOSIS — L02211 Cutaneous abscess of abdominal wall: Secondary | ICD-10-CM | POA: Diagnosis not present

## 2022-07-11 DIAGNOSIS — I509 Heart failure, unspecified: Secondary | ICD-10-CM | POA: Diagnosis not present

## 2022-07-11 DIAGNOSIS — I10 Essential (primary) hypertension: Secondary | ICD-10-CM | POA: Diagnosis not present

## 2022-07-14 DIAGNOSIS — E782 Mixed hyperlipidemia: Secondary | ICD-10-CM | POA: Diagnosis not present

## 2022-07-14 DIAGNOSIS — E7849 Other hyperlipidemia: Secondary | ICD-10-CM | POA: Diagnosis not present

## 2022-07-14 DIAGNOSIS — I10 Essential (primary) hypertension: Secondary | ICD-10-CM | POA: Diagnosis not present

## 2022-07-14 DIAGNOSIS — Z0001 Encounter for general adult medical examination with abnormal findings: Secondary | ICD-10-CM | POA: Diagnosis not present

## 2022-07-14 DIAGNOSIS — M1A00X0 Idiopathic chronic gout, unspecified site, without tophus (tophi): Secondary | ICD-10-CM | POA: Diagnosis not present

## 2022-07-14 DIAGNOSIS — L02211 Cutaneous abscess of abdominal wall: Secondary | ICD-10-CM | POA: Diagnosis not present

## 2022-07-14 DIAGNOSIS — I509 Heart failure, unspecified: Secondary | ICD-10-CM | POA: Diagnosis not present

## 2022-07-14 DIAGNOSIS — D6869 Other thrombophilia: Secondary | ICD-10-CM | POA: Diagnosis not present

## 2022-07-14 DIAGNOSIS — I25709 Atherosclerosis of coronary artery bypass graft(s), unspecified, with unspecified angina pectoris: Secondary | ICD-10-CM | POA: Diagnosis not present

## 2022-07-14 DIAGNOSIS — Z7901 Long term (current) use of anticoagulants: Secondary | ICD-10-CM | POA: Diagnosis not present

## 2022-07-14 DIAGNOSIS — M5136 Other intervertebral disc degeneration, lumbar region: Secondary | ICD-10-CM | POA: Diagnosis not present

## 2022-07-25 ENCOUNTER — Encounter: Payer: Self-pay | Admitting: Cardiovascular Disease

## 2022-07-25 ENCOUNTER — Ambulatory Visit: Payer: Medicare Other | Attending: Cardiovascular Disease | Admitting: Cardiovascular Disease

## 2022-07-25 VITALS — BP 213/82 | HR 71 | Ht 63.0 in | Wt 197.8 lb

## 2022-07-25 DIAGNOSIS — I70211 Atherosclerosis of native arteries of extremities with intermittent claudication, right leg: Secondary | ICD-10-CM

## 2022-07-25 DIAGNOSIS — I739 Peripheral vascular disease, unspecified: Secondary | ICD-10-CM

## 2022-07-25 NOTE — Patient Instructions (Signed)
Medication Instructions:  Your physician recommends that you continue on your current medications as directed. Please refer to the Current Medication list given to you today.  *If you need a refill on your cardiac medications before your next appointment, please call your pharmacy*   Follow-Up: At [facility], you and your health needs are our priority.  As part of our continuing mission to provide you with exceptional heart care, we have created designated Provider Care Teams.  These Care Teams include your primary Cardiologist (physician) and Advanced Practice Providers (APPs -  Physician Assistants and Nurse Practitioners) who all work together to provide you with the care you need, when you need it.  We recommend signing up for the patient portal called "MyChart".  Sign up information is provided on this After Visit Summary.  MyChart is used to connect with patients for Virtual Visits (Telemedicine).  Patients are able to view lab/test results, encounter notes, upcoming appointments, etc.  Non-urgent messages can be sent to your provider as well.   To learn more about what you can do with MyChart, go to [email or web address].    Your next appointment:   6 month(s)  Provider:   Jonathan Berry, MD    

## 2022-07-25 NOTE — Assessment & Plan Note (Signed)
Jennifer [name] was first referred to me by Dr. Jenene Slicker in [place] because of symptomatic PAD.  She had Doppler studies performed 06/11/2020 revealing a right ABI of 0.73 and a left of 0.91.  She had an occluded right SFA, AT and PT.  She had mildly elevated velocities in her proximal thigh.  I did begin her on Pletal 50 mg p.o. twice daily which resulted in marked improvement in her symptoms to the point where we no longer are considering an invasive evaluation.

## 2022-07-25 NOTE — Progress Notes (Signed)
07/25/2022 Jennifer [name]   25-Jun-1950  [id number]  Primary Physician Jennifer Nevins, MD Primary Cardiologist: Jennifer Gess MD Jennifer [name], [place]  HPI:  Jennifer [name] is a 72 y.o.  moderately overweight divorced and widowed Caucasian female mother of 2 children, grandmother of 3 grandchildren who I last saw in the office 02/21/2022.  She was referred by her primary cardiologist, Jennifer [name] for peripheral vascular valuation because of lifestyle-limiting claudication.  She worked on her tobacco form in her early years.  Risk factors include 45-pack-year tobacco abuse continue to smoke 1 pack/day, treated hypertension and hyperlipidemia.  She has strong family history for heart disease with both parents and brother all of whom have had ischemic heart disease.  She had a non-STEMI 12/18 and had CABG at that time.  She has had left lower extremity DVT and pulmonary emboli on Xarelto.  She is hide carotid disease and under gone TCAR by the vein and vascular specialist on the right but unfortunately this is occluded.  She complains of left greater than right lower extremity claudication.  She did have Doppler studies performed 06/11/2020 that showed normal ABIs bilaterally and lower extremity arterial Doppler studies performed at [facility] 02/06/2022 that suggested an occluded right SFA.  Her Doppler studies performed 02/23/2022 revealed a right ABI of 0.73 and a left of 0.91.  She had an occluded right SFA, AT and PT.  I began her on Pletal 50 mg p.o. twice daily which resulted in marked improvement in her claudication symptoms.  Unfortunately, she does continue to smoke.   Current Meds  Medication Sig   albuterol (VENTOLIN HFA) 108 (90 Base) MCG/ACT inhaler Inhale into the lungs.   allopurinol (ZYLOPRIM) 300 MG tablet Take 300 mg by mouth daily.   amLODipine-olmesartan (AZOR) 5-40 MG tablet Take 1 tablet by mouth daily.   APO-VARENICLINE 1 MG tablet    aspirin EC 81 MG EC  tablet Take 1 tablet (81 mg total) by mouth daily.   atorvastatin (LIPITOR) 80 MG tablet Take 80 mg by mouth daily.   calcium carbonate (TUMS - DOSED IN MG ELEMENTAL CALCIUM) 500 MG chewable tablet Chew 2 tablets by mouth 3 (three) times daily with meals.    carvedilol (COREG) 3.125 MG tablet TAKE (1) TABLET BY MOUTH TWICE DAILY.   Cholecalciferol (VITAMIN D) 2000 units CAPS Take 2,000 Units by mouth daily.   cilostazol (PLETAL) 50 MG tablet Take 1 tablet (50 mg total) by mouth 2 (two) times daily.   DULoxetine (CYMBALTA) 20 MG capsule Take 20 mg by mouth 2 (two) times daily.   FeFum-FePoly-FA-B Cmp-C-Biot (INTEGRA PLUS) CAPS Take 1 capsule daily with a meal   gabapentin (NEURONTIN) 600 MG tablet Take 300-600 mg by mouth See admin instructions. Take 300 mg twice daily IN THE MORNING AND AT NOON AND  600 mg at bedtime.   lisinopril (ZESTRIL) 40 MG tablet TAKE ONE TABLET BY MOUTH EVERY MORNING   Omega-3 Fatty Acids (FISH OIL) 1360 MG CAPS Take by mouth.   Oxycodone HCl 10 MG TABS Take 10 mg by mouth 3 (three) times daily as needed. For pain   rivaroxaban (XARELTO) 20 MG TABS tablet Take 20 mg by mouth daily with supper.     Allergies  Allergen Reactions   Bee Venom Anaphylaxis    Social History   Socioeconomic History   Marital status: Single    Spouse name: Not on file   Number of children: 2  Years of education: 24   Highest education level: Not on file  Occupational History   Occupation: Retired  Tobacco Use   Smoking status: Every Day    Packs/day: 0.25    Years: 50.00    Additional pack years: 0.00    Total pack years: 12.50    Types: Cigarettes    Start date: 06/18/1967   Smokeless tobacco: Never   Tobacco comments:    3-4 a day  Vaping Use   Vaping Use: Never used  Substance and Sexual Activity   Alcohol use: No    Alcohol/week: 0.0 standard drinks of alcohol   Drug use: No   Sexual activity: Not Currently  Other Topics Concern   Not on file  Social History  Narrative   Lives at home alone.   Right-handed.   2 cups caffeine per day.   Social Determinants of Health   Financial Resource Strain: Not on file  Food Insecurity: Not on file  Transportation Needs: Not on file  Physical Activity: Not on file  Stress: Not on file  Social Connections: Not on file  Intimate Partner Violence: Not on file     Review of Systems: General: negative for chills, fever, night sweats or weight changes.  Cardiovascular: negative for chest pain, dyspnea on exertion, edema, orthopnea, palpitations, paroxysmal nocturnal dyspnea or shortness of breath Dermatological: negative for rash Respiratory: negative for cough or wheezing Urologic: negative for hematuria Abdominal: negative for nausea, vomiting, diarrhea, bright red blood per rectum, melena, or hematemesis Neurologic: negative for visual changes, syncope, or dizziness All other systems reviewed and are otherwise negative except as noted above.    Blood pressure (!) 213/82, pulse 71, height 5\' 3"  (1.6 m), weight 197 lb 12.8 oz (89.7 kg), SpO2 96 %.  General appearance: alert and no distress Neck: no adenopathy, no JVD, supple, symmetrical, trachea midline, thyroid not enlarged, symmetric, no tenderness/mass/nodules, and bilateral carotid bruits Lungs: clear to auscultation bilaterally Heart: systolic murmur: late systolic 2/6, harsh upper sternal border Extremities: extremities normal, atraumatic, no cyanosis or edema Pulses: Diminished pedal pulses Skin: Skin color, texture, turgor normal. No rashes or lesions Neurologic: Grossly normal  EKG sinus rhythm at 71 with reverse R wave progression.  I personally reviewed this EKG.  ASSESSMENT AND PLAN:   Atherosclerosis of native artery of right lower extremity with intermittent claudication [facility]) Jennifer [name] was first referred to me by Dr. Jenene [name] in [place] because of symptomatic PAD.  She had Doppler studies performed 06/11/2020 revealing a right  ABI of 0.73 and a left of 0.91.  She had an occluded right SFA, AT and PT.  She had mildly elevated velocities in her proximal thigh.  I did begin her on Pletal 50 mg p.o. twice daily which resulted in marked improvement in her symptoms to the point where we no longer are considering an invasive evaluation.     Jennifer Gess MD FACP,FACC,FAHA, [facility] 07/25/2022 2:18 PM

## 2022-08-03 DIAGNOSIS — Z7901 Long term (current) use of anticoagulants: Secondary | ICD-10-CM | POA: Diagnosis not present

## 2022-08-03 DIAGNOSIS — D6869 Other thrombophilia: Secondary | ICD-10-CM | POA: Diagnosis not present

## 2022-08-03 DIAGNOSIS — I10 Essential (primary) hypertension: Secondary | ICD-10-CM | POA: Diagnosis not present

## 2022-08-03 DIAGNOSIS — I509 Heart failure, unspecified: Secondary | ICD-10-CM | POA: Diagnosis not present

## 2022-08-03 DIAGNOSIS — R5383 Other fatigue: Secondary | ICD-10-CM | POA: Diagnosis not present

## 2022-08-03 DIAGNOSIS — I25709 Atherosclerosis of coronary artery bypass graft(s), unspecified, with unspecified angina pectoris: Secondary | ICD-10-CM | POA: Diagnosis not present

## 2022-08-11 DIAGNOSIS — I10 Essential (primary) hypertension: Secondary | ICD-10-CM | POA: Diagnosis not present

## 2022-08-11 DIAGNOSIS — E782 Mixed hyperlipidemia: Secondary | ICD-10-CM | POA: Diagnosis not present

## 2022-08-11 DIAGNOSIS — I509 Heart failure, unspecified: Secondary | ICD-10-CM | POA: Diagnosis not present

## 2022-08-22 DIAGNOSIS — D225 Melanocytic nevi of trunk: Secondary | ICD-10-CM | POA: Diagnosis not present

## 2022-08-22 DIAGNOSIS — L732 Hidradenitis suppurativa: Secondary | ICD-10-CM | POA: Diagnosis not present

## 2022-08-22 DIAGNOSIS — D2261 Melanocytic nevi of right upper limb, including shoulder: Secondary | ICD-10-CM | POA: Diagnosis not present

## 2022-08-22 DIAGNOSIS — D485 Neoplasm of uncertain behavior of skin: Secondary | ICD-10-CM | POA: Diagnosis not present

## 2022-09-08 DIAGNOSIS — I10 Essential (primary) hypertension: Secondary | ICD-10-CM | POA: Diagnosis not present

## 2022-09-08 DIAGNOSIS — G894 Chronic pain syndrome: Secondary | ICD-10-CM | POA: Diagnosis not present

## 2022-09-10 DIAGNOSIS — I10 Essential (primary) hypertension: Secondary | ICD-10-CM | POA: Diagnosis not present

## 2022-09-10 DIAGNOSIS — M1A00X0 Idiopathic chronic gout, unspecified site, without tophus (tophi): Secondary | ICD-10-CM | POA: Diagnosis not present

## 2022-10-05 DIAGNOSIS — I10 Essential (primary) hypertension: Secondary | ICD-10-CM | POA: Diagnosis not present

## 2022-10-05 DIAGNOSIS — G894 Chronic pain syndrome: Secondary | ICD-10-CM | POA: Diagnosis not present

## 2022-10-27 ENCOUNTER — Encounter: Payer: Self-pay | Admitting: Internal Medicine

## 2022-10-27 ENCOUNTER — Ambulatory Visit: Payer: Medicare Other | Attending: Internal Medicine | Admitting: Internal Medicine

## 2022-10-27 VITALS — BP 146/66 | HR 72 | Ht 63.0 in | Wt 197.0 lb

## 2022-10-27 DIAGNOSIS — I739 Peripheral vascular disease, unspecified: Secondary | ICD-10-CM | POA: Diagnosis not present

## 2022-10-27 DIAGNOSIS — I6529 Occlusion and stenosis of unspecified carotid artery: Secondary | ICD-10-CM

## 2022-10-27 MED ORDER — CILOSTAZOL 100 MG PO TABS: 100.0000 mg | ORAL_TABLET | Freq: Two times a day (BID) | ORAL | 3 refills | Status: AC

## 2022-10-27 NOTE — Patient Instructions (Signed)
Medication Instructions:  Your physician has recommended you make the following change in your medication:   -Increase Pletal to 100 mg twice daily.  -Take Lipitor 80 mg tablet at bedtime for maximum effectiveness.   *If you need a refill on your cardiac medications before your next appointment, please call your pharmacy*   Lab Work: None If you have labs (blood work) drawn today and your tests are completely normal, you will receive your results only by: MyChart Message (if you have MyChart) OR A paper copy in the mail If you have any lab test that is abnormal or we need to change your treatment, we will call you to review the results.   Testing/Procedures: Your physician has requested that you have a carotid duplex. This test is an ultrasound of the carotid arteries in your neck. It looks at blood flow through these arteries that supply the brain with blood. Allow one hour for this exam. There are no restrictions or special instructions.    Follow-Up: At [facility], you and your health needs are our priority.  As part of our continuing mission to provide you with exceptional heart care, we have created designated Provider Care Teams.  These Care Teams include your primary Cardiologist (physician) and Advanced Practice Providers (APPs -  Physician Assistants and Nurse Practitioners) who all work together to provide you with the care you need, when you need it.  We recommend signing up for the patient portal called "MyChart".  Sign up information is provided on this After Visit Summary.  MyChart is used to connect with patients for Virtual Visits (Telemedicine).  Patients are able to view lab/test results, encounter notes, upcoming appointments, etc.  Non-urgent messages can be sent to your provider as well.   To learn more about what you can do with MyChart, go to [email or web address].    Your next appointment:   6 month(s)  Provider:   You may see Vishnu P Mallipeddi,  MD or one of the following Advanced Practice Providers on your designated Care Team:   [place], PA-C  Jacolyn Reedy, PA-C     Other Instructions You have been referred to SET. They will contact you with your first appointment.

## 2022-10-27 NOTE — Progress Notes (Signed)
Cardiology Office Note  Date: 10/27/2022   ID: Jennifer [name], DOB [birth year], MRN [id number]  PCP:  Elfredia Nevins, MD  Cardiologist:  Marjo Bicker, MD Electrophysiologist:  None    History of Present Illness: Jennifer [name] is a 72 y.o. female known to have CAD status post CABG in 2018 (LIMA to LAD, SVG to OM, SVG to PDA) with normal LVEF, HTN, HLD, tobacco abuse, bilateral carotid stenosis, TIA, lower extremity PAD (moderate R PAD, mild L PAD presented to cardiology clinic for follow-up visit.  Overall doing great.  After starting Pletal 50 mg twice daily, her claudication symptoms improved.  She still continues to have claudication bilateral lower extremities daily but not as bad as before.  Denies any angina, DOE, orthopnea, PND, dizziness, presyncope, syncope, palpitations or leg swelling.  Continues to smoke 1 pack/day since she is having a lot of stress in her life, she said it is difficult to quit smoking at this time but she knows that she has to quit at some point.   Past Medical History:  Diagnosis Date   Anemia    Anxiety    Arthritis    pt. denies   Bell's palsy    CAD (coronary artery disease)    a. Cath 12/2014 - mild-moderate non-obstructive CAD with 60% mid RCA, 50% mid Cx-distal Cx, 30% distal LAD, 25% D2, EF 55-65%. b. 02/2017: NSTEMI with cath showing 60% LM stenosis and 100% RCA stenosis --> CT Surgery consulted with CABG on 02/21/2017 (LIMA-LAD, SVG-OM, and SVG-PDA)   Depression    Dizziness    DVT (deep venous thrombosis) (HCC)    on Xarelto   Dyspnea    W/ EXERTION pt. 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 Genia Del   COLONOSCOPY WITH PROPOFOL N/A 02/16/2017   Procedure: COLONOSCOPY WITH PROPOFOL;  Surgeon: Rachael Fee, MD;  Location: [facility] ENDOSCOPY;  Service: Endoscopy;  Laterality: N/A;   CORONARY ARTERY BYPASS GRAFT N/A 02/21/2017   Procedure: CORONARY ARTERY BYPASS GRAFTING (CABG) times three. LIMA to LAD, SVG to OM and SVG to PDA;  Surgeon: Loreli Slot, MD;  Location: [facility] OR;  Service: Open Heart Surgery;  Laterality: N/A;   CORONARY STENT PLACEMENT Right 1980   ENTEROSCOPY N/A 06/14/2017   Procedure: ENTEROSCOPY;  Surgeon: Rachael Fee, MD;  Location: [facility] ENDOSCOPY;  Service: Endoscopy;  Laterality: N/A;   ESOPHAGOGASTRODUODENOSCOPY (EGD) WITH PROPOFOL N/A 02/16/2017   Procedure: ESOPHAGOGASTRODUODENOSCOPY (EGD) WITH PROPOFOL;  Surgeon: Rachael Fee, MD;  Location: [facility] ENDOSCOPY;  Service: Endoscopy;  Laterality: N/A;   EYE SURGERY Bilateral 06/2019   Cat Sx - Dr. Genia Del   GIVENS CAPSULE STUDY  N/A 05/08/2017   Procedure: GIVENS CAPSULE STUDY;  Surgeon: Rachael Fee, MD;  Location: [facility] ENDOSCOPY;  Service: Endoscopy;  Laterality: N/A;   KNEE ARTHROSCOPY W/ MENISCAL REPAIR     LEFT KNEE   FORMED DVT (SURG TO REMOVE BLOOD CLOT)   LEFT HEART CATH AND CORONARY ANGIOGRAPHY N/A 02/19/2017   Procedure: LEFT HEART CATH AND CORONARY ANGIOGRAPHY;  Surgeon: Yvonne Kendall, MD;  Location: [facility] INVASIVE CV LAB;  Service: Cardiovascular;  Laterality: N/A;   TEE WITHOUT CARDIOVERSION N/A 02/21/2017   Procedure: TRANSESOPHAGEAL ECHOCARDIOGRAM (TEE);  Surgeon: Loreli Slot, MD;  Location: [facility] OR;  Service: Open Heart Surgery;  Laterality: N/A;    TONSILLECTOMY      Current Outpatient Medications  Medication Sig Dispense Refill   albuterol (VENTOLIN HFA) 108 (90 Base) MCG/ACT inhaler Inhale into the lungs.     allopurinol (ZYLOPRIM) 300 MG tablet Take 300 mg by mouth daily.     amLODipine-olmesartan (AZOR) 5-40 MG tablet Take 1 tablet by mouth daily.     APO-VARENICLINE 1 MG tablet      aspirin EC 81 MG EC tablet Take 1 tablet (81 mg total) by mouth daily.     atorvastatin (LIPITOR) 80 MG tablet Take 80 mg by mouth daily.     calcium carbonate (TUMS - DOSED IN MG ELEMENTAL CALCIUM) 500 MG chewable tablet Chew 2 tablets by mouth 3 (three) times daily with meals.      carvedilol (COREG) 3.125 MG tablet TAKE (1) TABLET BY MOUTH TWICE DAILY. 180 tablet 3   Cholecalciferol (VITAMIN D) 2000 units CAPS Take 2,000 Units by mouth daily.     cilostazol (PLETAL) 50 MG tablet Take 1 tablet (50 mg total) by mouth 2 (two) times daily. 180 tablet 2   DULoxetine (CYMBALTA) 20 MG capsule Take 20 mg by mouth 2 (two) times daily.     FeFum-FePoly-FA-B Cmp-C-Biot (INTEGRA PLUS) CAPS Take 1 capsule daily with a meal 30 capsule 3   gabapentin (NEURONTIN) 600 MG tablet Take 300-600 mg by mouth See admin instructions. Take 300 mg twice daily IN THE MORNING AND AT NOON AND  600 mg at bedtime.     lisinopril (ZESTRIL) 40 MG tablet TAKE ONE TABLET BY MOUTH EVERY MORNING 90 tablet 2   Omega-3 Fatty Acids (FISH OIL) 1360 MG CAPS Take by mouth.     Oxycodone HCl 10 MG TABS Take 10 mg by mouth 3 (three) times daily as needed. For pain     rivaroxaban (XARELTO) 20 MG TABS tablet Take 20 mg by mouth daily with supper.     No current facility-administered medications for this visit.   Allergies:  Bee venom   Social History: The patient  reports that she has been smoking cigarettes. She started smoking about 55 years ago. She has a 13.8 pack-year smoking history. She has never used smokeless tobacco. She reports that she does not drink alcohol and does not use drugs.    Family History: The patient's family history includes AAA (abdominal aortic aneurysm) in her mother; Dementia in her paternal grandmother; Heart attack in her brother, brother, brother, father, and mother; Heart disease in her brother, father, and maternal grandfather; Ovarian cancer in her maternal grandmother.   ROS:  Please see the history of present illness. Otherwise, complete review of systems is positive for none.  All other systems are reviewed and negative.   Physical Exam: VS:  BP (!) 146/66   Pulse 72   Ht 5\' 3"  (1.6  m)   Wt 197 lb (89.4 kg)   SpO2 97%   BMI 34.90 kg/m , BMI Body mass index is 34.9 kg/m.  Wt Readings from Last 3 Encounters:  10/27/22 197 lb (89.4 kg)  07/25/22 197 lb 12.8 oz (89.7 kg)  04/20/22 196 lb (88.9 kg)    General: Patient appears comfortable at rest. HEENT: Conjunctiva and lids normal, oropharynx clear with moist mucosa. Neck: Supple, no elevated JVP or carotid bruits, no thyromegaly. Lungs: Clear to auscultation, nonlabored breathing at rest. Cardiac: Regular rate and rhythm, no S3 or significant systolic murmur, no pericardial rub. Abdomen: Soft, nontender, no hepatomegaly, bowel sounds present, no guarding or rebound. Extremities: No pitting edema, R DP dopplerable and L DP absent Skin: Warm and dry. Musculoskeletal: No kyphosis. Neuropsychiatric: Alert and oriented x3, affect grossly appropriate.  Recent Labwork: No results found for requested labs within last 365 days.     Component Value Date/Time   CHOL 129 02/14/2017 0841   TRIG 162 (H) 02/14/2017 0841   HDL 30 (L) 02/14/2017 0841   CHOLHDL 4.3 02/14/2017 0841   VLDL 32 02/14/2017 0841   LDLCALC 67 02/14/2017 0841    Other Studies Reviewed Today: Echo from 2018 LVEF 60 to 65% Mild MR  LHC from 2018 Significant multivessel coronary artery disease, including 60% ostial LMCA stenosis with catheter dampening, 70% dominant OM stenosis, and subacute versus chronic occlusion of  the mid RCA with faint bridging and left-to-right collaterals. Normal left ventricular contraction with mildly elevated filling pressure.  Assessment and Plan: Patient is a 72 year old F known to have CAD status post CABG in 2018 (LIMA to LAD, SVG to OM, SVG to PDA) with normal LVEF, HTN, HLD, tobacco abuse, bilateral carotid stenosis, TIA presented to cardiology clinic for follow-up visit.  CAD status post CABG in 2018 (LIMA to LAD, SVG to OM, SVG to PDA) with normal LVEF, angina free: Continue aspirin 81 mg once daily, atorvastatin 40 mg nightly, carvedilol 3.125 mg twice daily, lisinopril 40 mg once daily, SL NTG 0.4 mg as needed, ER precautions for chest pain provided.  Lower extremity PAD (moderate R PAD and mild L PAD): Has claudication bilateral lower extremities but improved compared to before after starting Pletal 50 mg twice daily.  She continues to have claudication daily but not as severe as before.  Increase Pletal dose from 50 mg to 100 mg twice daily.  Discussed about the side effects including palpitations. Continue cardio prudent medications as stated above, aspirin and high intensity statin. Strongly encourage smoking cessation. She was previously referred to supervised exercise therapy but she had a lot of stress going on in her life, most low on finances and since had to put off on that.  Now, she says she can go to the rehab for PAD.  I discussed the benefits of SET for PAD with the patient including increased quality of life, increased ambulation distance.  Discussed risk factor modification of PAD including smoking cessation and continue cardioprotective medications.  Will place a new referral to SET.  Carotid artery stenosis: Carotid Doppler bilateral in 2022 showed total occlusion of R ICA, CCA appears occluded as well, L ICA 40 to 59% stenosis.  Will repeat carotid Doppler bilateral.  Last follow-up with vascular surgery was in 2020.  Continue cardioprotective medications as  stated above including aspirin and high intensity statin.  HLD, not at goal: Lipid panel from 07/2022 reviewed.  LDL 150 (elevated) and TG 409 (normal).  Patient reported missing atorvastatin  doses which might have led to elevated LDL.  Instructed her to take atorvastatin daily at bedtime and not in the morning.  Will repeat the lipid panel the next visit.  If continues to stay elevated, she will benefit from addition of Zetia 10 mg once daily.  Goal LDL less than 55.  HTN, controlled: Continue carvedilol 3.125 mg twice daily and lisinopril 40 mg once daily.  Nicotine abuse: Currently smoking 1 pack/day.  Try Chantix, had nightmares/anxiety after which she stopped the medication.  Her older son was giving a nicotine gums which she is trying.     Medication Adjustments/Labs and Tests Ordered: Current medicines are reviewed at length with the patient today.  Concerns regarding medicines are outlined above.    Disposition:  Follow up  6 months  Signed, Corydon Schweiss Verne Spurr, MD, 10/27/2022 1:33 PM    [facility] at [facility] [address], [place], [place] [address]

## 2022-11-03 DIAGNOSIS — G894 Chronic pain syndrome: Secondary | ICD-10-CM | POA: Diagnosis not present

## 2022-11-08 ENCOUNTER — Ambulatory Visit (HOSPITAL_COMMUNITY)
Admission: RE | Admit: 2022-11-08 | Discharge: 2022-11-08 | Disposition: A | Discharge status: Home / Self Care | Payer: Medicare Other | Source: Ambulatory Visit | Attending: Internal Medicine | Admitting: Internal Medicine

## 2022-11-08 ENCOUNTER — Telehealth: Payer: Self-pay | Admitting: Internal Medicine

## 2022-11-08 DIAGNOSIS — I6523 Occlusion and stenosis of bilateral carotid arteries: Secondary | ICD-10-CM | POA: Diagnosis not present

## 2022-11-08 DIAGNOSIS — I6529 Occlusion and stenosis of unspecified carotid artery: Secondary | ICD-10-CM

## 2022-11-08 NOTE — Telephone Encounter (Signed)
  IMPRESSION: 1. 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 2. Large amount of left-sided atherosclerotic plaque results in a sonographic string sign and elevated peak systolic velocities within the left internal carotid artery compatible with the higher end of the 50-69% luminal narrowing range, though note, velocity measurements are unreliable in the setting of a contralateral occlusion. Further evaluation with CTA could be performed as indicated. 3. Preserved antegrade flow within the bilateral vertebral arteries.   These results will be called to the ordering clinician or representative by the Radiologist Assistant, and communication documented in the PACS or [employer].     Electronically Signed   By: Simonne Come M.D.   On: 11/08/2022 14:25   [facility] recommended CTA for further evaluation.    Please advise.

## 2022-11-08 NOTE — Telephone Encounter (Signed)
Calling with ultrasound report. Please advise

## 2022-11-10 NOTE — Telephone Encounter (Signed)
Order placed per providers request.

## 2022-11-26 ENCOUNTER — Other Ambulatory Visit: Payer: Self-pay

## 2022-11-26 ENCOUNTER — Emergency Department (HOSPITAL_COMMUNITY): Payer: Medicare Other

## 2022-11-26 ENCOUNTER — Encounter (HOSPITAL_COMMUNITY): Payer: Self-pay | Admitting: *Deleted

## 2022-11-26 ENCOUNTER — Emergency Department (HOSPITAL_COMMUNITY)
Admission: EM | Admit: 2022-11-26 | Discharge: 2022-11-26 | Disposition: A | Discharge status: Home / Self Care | Payer: Medicare Other | Attending: Emergency Medicine | Admitting: Emergency Medicine

## 2022-11-26 DIAGNOSIS — S92151A Displaced avulsion fracture (chip fracture) of right talus, initial encounter for closed fracture: Secondary | ICD-10-CM | POA: Insufficient documentation

## 2022-11-26 DIAGNOSIS — Y92002 Bathroom of unspecified non-institutional (private) residence single-family (private) house as the place of occurrence of the external cause: Secondary | ICD-10-CM | POA: Diagnosis not present

## 2022-11-26 DIAGNOSIS — W19XXXA Unspecified fall, initial encounter: Secondary | ICD-10-CM | POA: Diagnosis not present

## 2022-11-26 DIAGNOSIS — M7731 Calcaneal spur, right foot: Secondary | ICD-10-CM | POA: Diagnosis not present

## 2022-11-26 DIAGNOSIS — Z7901 Long term (current) use of anticoagulants: Secondary | ICD-10-CM | POA: Insufficient documentation

## 2022-11-26 DIAGNOSIS — S99911A Unspecified injury of right ankle, initial encounter: Secondary | ICD-10-CM | POA: Diagnosis present

## 2022-11-26 DIAGNOSIS — Z7982 Long term (current) use of aspirin: Secondary | ICD-10-CM | POA: Insufficient documentation

## 2022-11-26 DIAGNOSIS — S92101A Unspecified fracture of right talus, initial encounter for closed fracture: Secondary | ICD-10-CM | POA: Diagnosis not present

## 2022-11-26 DIAGNOSIS — M79671 Pain in right foot: Secondary | ICD-10-CM | POA: Diagnosis not present

## 2022-11-26 MED ORDER — OXYCODONE HCL 5 MG PO TABS
5.0000 mg | ORAL_TABLET | Freq: Once | ORAL | Status: AC
  Administered 2022-11-26: 5 mg via ORAL
  Filled 2022-11-26: qty 1

## 2022-11-26 NOTE — Discharge Instructions (Signed)
Wear the boot when walking or standing.  You may remove at bedtime and for bathing.  Also recommend that you use your walker to help with balance.  Elevate your foot when possible.  Call the orthopedic provider listed to arrange follow-up appointment.

## 2022-11-26 NOTE — ED Triage Notes (Signed)
Pt states she fell last night while going to the bathroom; she said she started walking and her foot gave away  Pt has pain and swelling

## 2022-11-26 NOTE — ED Provider Notes (Signed)
[place] EMERGENCY DEPARTMENT AT [facility] Provider Note   CSN: [id number] Arrival date & time: 11/26/22  7829     History  Chief Complaint  Patient presents with   Fall   Ankle Pain    Jennifer [name] is a 72 y.o. female.   Fall Pertinent negatives include no chest pain, no headaches and no shortness of breath.  Ankle Pain Associated symptoms: no fever        Jennifer [name] is a 72 y.o. female who presents to the Emergency Department complaining of right ankle pain and swelling since last evening.  She states that she fell while going to the bathroom and rolled her right foot.  She has throbbing pain along the top of her foot and ankle.  Denies any numbness or tingling of her toes.  No pain of her lower leg.  She denies head injury or LOC.  Took Tylenol prior to arrival without relief.  Home Medications Prior to Admission medications   Medication Sig Start Date End Date Taking? Authorizing Provider  albuterol (VENTOLIN HFA) 108 (90 Base) MCG/ACT inhaler Inhale into the lungs. 04/14/22   [provider]  allopurinol (ZYLOPRIM) 300 MG tablet Take 300 mg by mouth daily. 12/19/16   [provider]  amLODipine-olmesartan (AZOR) 5-40 MG tablet Take 1 tablet by mouth daily. 01/28/20   [provider]  APO-VARENICLINE 1 MG tablet  01/21/20   [provider]  aspirin EC 81 MG EC tablet Take 1 tablet (81 mg total) by mouth daily. 02/28/17   Gold, Wayne E, PA-C  atorvastatin (LIPITOR) 80 MG tablet Take 80 mg by mouth daily.    [provider]  calcium carbonate (TUMS - DOSED IN MG ELEMENTAL CALCIUM) 500 MG chewable tablet Chew 2 tablets by mouth 3 (three) times daily with meals.     [provider]  carvedilol (COREG) 3.125 MG tablet TAKE (1) TABLET BY MOUTH TWICE DAILY. 04/07/20   Netta Neat., NP  Cholecalciferol (VITAMIN D) 2000 units CAPS Take 2,000 Units by mouth daily.    [provider]  cilostazol  (PLETAL) 100 MG tablet Take 1 tablet (100 mg total) by mouth 2 (two) times daily. 10/27/22   Mallipeddi, Vishnu P, MD  DULoxetine (CYMBALTA) 20 MG capsule Take 20 mg by mouth 2 (two) times daily. 01/28/20   [provider]  FeFum-FePoly-FA-B Cmp-C-Biot (INTEGRA PLUS) CAPS Take 1 capsule daily with a meal 05/08/17   Esterwood, Amy S, PA-C  gabapentin (NEURONTIN) 600 MG tablet Take 300-600 mg by mouth See admin instructions. Take 300 mg twice daily IN THE MORNING AND AT NOON AND  600 mg at bedtime.    [provider]  lisinopril (ZESTRIL) 40 MG tablet TAKE ONE TABLET BY MOUTH EVERY MORNING 11/16/20   Netta Neat., NP  Omega-3 Fatty Acids (FISH OIL) 1360 MG CAPS Take by mouth.    [provider]  Oxycodone HCl 10 MG TABS Take 10 mg by mouth 3 (three) times daily as needed. For pain    [provider]  rivaroxaban (XARELTO) 20 MG TABS tablet Take 20 mg by mouth daily with supper.    [provider]      Allergies    Bee venom    Review of Systems   Review of Systems  Constitutional:  Negative for chills and fever.  Respiratory:  Negative for shortness of breath.   Cardiovascular:  Negative for chest pain.  Musculoskeletal:  Positive for arthralgias (Right ankle pain and swelling).  Skin:  Negative for color change and wound.  Neurological:  Negative for dizziness, weakness, numbness and headaches.    Physical Exam Updated Vital Signs BP (!) 175/75 (BP Location: Right Arm)   Pulse 84   Temp 98.3 F (36.8 C) (Oral)   Resp 16   Ht 5\' 3"  (1.6 m)   Wt 89.4 kg   SpO2 100%   BMI 34.90 kg/m  Physical Exam Vitals and nursing note reviewed.  Constitutional:      General: She is not in acute distress.    Appearance: Normal appearance. She is not toxic-appearing.  Cardiovascular:     Rate and Rhythm: Normal rate and regular rhythm.     Pulses: Normal pulses.  Pulmonary:     Effort: Pulmonary effort is normal.  Chest:     Chest wall: No  tenderness.  Musculoskeletal:        General: Swelling, tenderness and signs of injury present.     Right ankle: Swelling present. No deformity. Tenderness present over the lateral malleolus. Normal pulse.     Right Achilles Tendon: Normal. No defects.     Comments: Tender to palpation to the proximal dorsal foot and lateral malleolus.  No bony deformity.  Distal foot nontender, no tenderness of the right lower leg.  Compartments are soft.  Neurological:     Mental Status: She is alert.     ED Results / Procedures / Treatments   Labs (all labs ordered are listed, but only abnormal results are displayed) Labs Reviewed - No data to display  EKG None  Radiology DG Foot Complete Right  Result Date: 11/26/2022 CLINICAL DATA:  Larey Seat last night going to the bathroom. Right foot pain. EXAM: RIGHT FOOT COMPLETE - 3+ VIEW COMPARISON:  None Available. FINDINGS: Small avulsion fracture from the dorsal anterior aspect of the talus with overlying soft tissue swelling. No other evidence of a fracture. No bone lesion.  Joints are normally spaced and aligned. Small plantar calcaneal spur. IMPRESSION: 1. Small avulsion fracture from the dorsal anterior aspect of the talus, that appears acute. 2. No other evidence of a fracture.  No dislocation. Electronically Signed   By: Amie Portland M.D.   On: 11/26/2022 11:07   DG Ankle Complete Right  Result Date: 11/26/2022 CLINICAL DATA:  Injury EXAM: RIGHT ANKLE - COMPLETE 3 VIEW COMPARISON:  10/29/2016. FINDINGS: Avulsion injury of the anterior talus with a tiny avulsion fragment displaced by couple of mm. There is no evidence of arthropathy or other focal bone abnormality. Moderate-sized plantar calcaneal spur and small posterior calcaneal spur similar to 2018. Soft tissue swelling noted at the lateral malleolus. IMPRESSION: Soft tissue swelling. Calcaneal spurs. Tiny avulsion fracture of the anterior talus. Electronically Signed   By: Layla Maw M.D.   On:  11/26/2022 11:07    Procedures Procedures    Medications Ordered in ED Medications  oxyCODONE (Oxy IR/ROXICODONE) immediate release tablet 5 mg (5 mg Oral Given 11/26/22 1046)    ED Course/ Medical Decision Making/ A&P                                 Medical Decision Making Patient here for evaluation of injury of right ankle, describes a mechanical fall that occurred last evening.  Has throbbing pain of her ankle and top of her foot.  No open wound or erythema.  No bony  deformity noted on exam   I suspect sprain, fracture dislocation also considered.  Will obtain x-ray.    Amount and/or Complexity of Data Reviewed Radiology: ordered.    Details: X-ray of the right foot and ankle shows tiny avulsion fracture off of the anterior talus. Discussion of management or test interpretation with external provider(s): Cam boot applied  Patient has walker at home, agrees to symptomatic treatment and close orthopedic follow-up.  Pain addressed here, database reviewed, patient has ongoing prescription for narcotics so understands that additional prescriptions are not warranted at this time.  Risk Prescription drug management.           Final Clinical Impression(s) / ED Diagnoses Final diagnoses:  Closed displaced avulsion fracture of right talus, initial encounter    Rx / DC Orders ED Discharge Orders     None         Rosey Bath 11/26/22 1220    Benjiman Core, MD 11/26/22 1526

## 2022-11-29 ENCOUNTER — Ambulatory Visit: Payer: Medicare Other | Admitting: Orthopedic Surgery

## 2022-11-29 ENCOUNTER — Encounter: Payer: Self-pay | Admitting: Orthopedic Surgery

## 2022-11-29 VITALS — BP 155/71 | HR 94

## 2022-11-29 DIAGNOSIS — S92154A Nondisplaced avulsion fracture (chip fracture) of right talus, initial encounter for closed fracture: Secondary | ICD-10-CM

## 2022-11-29 NOTE — Progress Notes (Signed)
Emergency room follow-up  Right ankle and foot injury  Patient is 72 years old she twisted her right foot on 11/26/2022 she sustained avulsion fracture of the dorsum of the talus the tibiotalar joint  She complains of pain and swelling and difficulty weightbearing  Review of systems listed below   She is on Xarelto and cilostazol/Pletal  Past Medical History:  Diagnosis Date   Anemia    Anxiety    Arthritis    pt. denies   Bell's palsy    CAD (coronary artery disease)    a. Cath 12/2014 - mild-moderate non-obstructive CAD with 60% mid RCA, 50% mid Cx-distal Cx, 30% distal LAD, 25% D2, EF 55-65%. b. 02/2017: NSTEMI with cath showing 60% LM stenosis and 100% RCA stenosis --> CT Surgery consulted with CABG on 02/21/2017 (LIMA-LAD, SVG-OM, and SVG-PDA)   Depression    Dizziness    DVT (deep venous thrombosis) (HCC)    on Xarelto   Dyspnea    W/ EXERTION pt. 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  Constitutional:      Appearance: Normal appearance.  HENT:     Head: Normocephalic and atraumatic.  Eyes:     General: No scleral icterus.       Right eye: No discharge.        Left eye: No discharge.     Extraocular Movements: Extraocular movements intact.     Conjunctiva/sclera: Conjunctivae normal.     Pupils: Pupils are equal, round, and reactive to light.  Cardiovascular:     Rate and Rhythm: Normal rate.     Pulses: Normal pulses.  Skin:    General: Skin is warm and dry.     Capillary Refill: Capillary refill takes less than 2 seconds.  Neurological:     General: No focal deficit present.     Mental Status: She is alert and oriented to person, place, and time.   Psychiatric:        Mood and Affect: Mood normal.        Behavior: Behavior normal.        Thought Content: Thought content normal.        Judgment: Judgment normal.    Right ankle foot  Skin is ecchymotic and bruised right foot ankle area.  Tender over the dorsum of the foot and ankle and also over the anterior talofibular ligament and she has a lot of swelling of the distal  X-rays were done externally.  My interpretation of those films: Right ankle avulsion fracture dorsum of the talus  Right foot  Plantar spur is also noted in the right foot  This is an essential sprain of the capsular ligament of the talonavicular joint and an ankle sprain with injury to the anterior talofibular ligament  The patient can be weight-bear as tolerated with walker and boot  Follow-up in 4 weeks to reassess  Encounter Diagnosis  Name Primary?   Closed nondisplaced avulsion fracture of right talus, initial encounter Yes

## 2022-12-01 DIAGNOSIS — Z7901 Long term (current) use of anticoagulants: Secondary | ICD-10-CM | POA: Diagnosis not present

## 2022-12-01 DIAGNOSIS — M5136 Other intervertebral disc degeneration, lumbar region: Secondary | ICD-10-CM | POA: Diagnosis not present

## 2022-12-01 DIAGNOSIS — M84471D Pathological fracture, right ankle, subsequent encounter for fracture with routine healing: Secondary | ICD-10-CM | POA: Diagnosis not present

## 2022-12-28 ENCOUNTER — Encounter: Payer: Self-pay | Admitting: Orthopedic Surgery

## 2022-12-28 ENCOUNTER — Ambulatory Visit: Payer: Medicare Other | Admitting: Orthopedic Surgery

## 2022-12-28 DIAGNOSIS — S92154D Nondisplaced avulsion fracture (chip fracture) of right talus, subsequent encounter for fracture with routine healing: Secondary | ICD-10-CM

## 2022-12-28 NOTE — Progress Notes (Signed)
This is a follow-up nondisplaced avulsion fracture right talus on 11/29/2022  Encounter Diagnosis  Name Primary?   Closed nondisplaced avulsion fracture of right talus with routine healing, subsequent encounter Yes    She is doing well just a little soreness  Ankle feels stable  A little tenderness laterally  Doing well ambulating normally resume normal activities follow-up as needed

## 2023-01-11 DIAGNOSIS — Z7901 Long term (current) use of anticoagulants: Secondary | ICD-10-CM | POA: Diagnosis not present

## 2023-01-11 DIAGNOSIS — I2699 Other pulmonary embolism without acute cor pulmonale: Secondary | ICD-10-CM | POA: Diagnosis not present

## 2023-01-11 DIAGNOSIS — I25709 Atherosclerosis of coronary artery bypass graft(s), unspecified, with unspecified angina pectoris: Secondary | ICD-10-CM | POA: Diagnosis not present

## 2023-01-11 DIAGNOSIS — D6869 Other thrombophilia: Secondary | ICD-10-CM | POA: Diagnosis not present

## 2023-01-11 DIAGNOSIS — I10 Essential (primary) hypertension: Secondary | ICD-10-CM | POA: Diagnosis not present

## 2023-01-29 ENCOUNTER — Ambulatory Visit: Payer: Medicare Other | Admitting: Cardiovascular Disease

## 2023-02-10 DIAGNOSIS — I10 Essential (primary) hypertension: Secondary | ICD-10-CM | POA: Diagnosis not present

## 2023-02-10 DIAGNOSIS — I2699 Other pulmonary embolism without acute cor pulmonale: Secondary | ICD-10-CM | POA: Diagnosis not present

## 2023-02-10 DIAGNOSIS — D6869 Other thrombophilia: Secondary | ICD-10-CM | POA: Diagnosis not present

## 2023-02-23 DIAGNOSIS — I10 Essential (primary) hypertension: Secondary | ICD-10-CM | POA: Diagnosis not present

## 2023-02-23 DIAGNOSIS — G894 Chronic pain syndrome: Secondary | ICD-10-CM | POA: Diagnosis not present

## 2023-03-29 DIAGNOSIS — E782 Mixed hyperlipidemia: Secondary | ICD-10-CM | POA: Diagnosis not present

## 2023-03-29 DIAGNOSIS — E7849 Other hyperlipidemia: Secondary | ICD-10-CM | POA: Diagnosis not present

## 2023-03-29 DIAGNOSIS — Z7901 Long term (current) use of anticoagulants: Secondary | ICD-10-CM | POA: Diagnosis not present

## 2023-03-29 DIAGNOSIS — G894 Chronic pain syndrome: Secondary | ICD-10-CM | POA: Diagnosis not present

## 2023-03-29 DIAGNOSIS — I10 Essential (primary) hypertension: Secondary | ICD-10-CM | POA: Diagnosis not present

## 2023-03-29 DIAGNOSIS — D6869 Other thrombophilia: Secondary | ICD-10-CM | POA: Diagnosis not present

## 2023-03-30 LAB — LAB REPORT - SCANNED: EGFR: 43

## 2023-04-17 ENCOUNTER — Ambulatory Visit: Payer: Medicare Other | Attending: Cardiovascular Disease | Admitting: Cardiovascular Disease

## 2023-04-17 ENCOUNTER — Encounter: Payer: Self-pay | Admitting: Cardiovascular Disease

## 2023-04-17 VITALS — BP 128/76 | HR 76 | Ht 63.0 in | Wt 199.6 lb

## 2023-04-17 DIAGNOSIS — E782 Mixed hyperlipidemia: Secondary | ICD-10-CM | POA: Diagnosis not present

## 2023-04-17 DIAGNOSIS — I70211 Atherosclerosis of native arteries of extremities with intermittent claudication, right leg: Secondary | ICD-10-CM

## 2023-04-17 DIAGNOSIS — R0989 Other specified symptoms and signs involving the circulatory and respiratory systems: Secondary | ICD-10-CM

## 2023-04-17 DIAGNOSIS — I739 Peripheral vascular disease, unspecified: Secondary | ICD-10-CM

## 2023-04-17 NOTE — Progress Notes (Signed)
 04/17/2023 SUMMERS BUENDIA   [birth year]  [id number]  Primary Physician Bertell Satterfield, MD Primary Cardiologist: Dorn JINNY Lesches MD GENI CODY MADEIRA, FSCAI  HPI:  Jennifer [name] is a 73 y.o.  moderately overweight divorced and widowed Caucasian female mother of 2 children, grandmother of 3 grandchildren who I last saw in the office 07/25/2022.  She was referred by her primary cardiologist, Dr.Mallipeddi for peripheral vascular valuation because of lifestyle-limiting claudication.  She worked on her tobacco form in her early years.  Risk factors include 45-pack-year tobacco abuse continue to smoke 1 pack/day, treated hypertension and hyperlipidemia.  She has strong family history for heart disease with both parents and brother all of whom have had ischemic heart disease.  She had a non-STEMI 12/18 and had CABG at that time.  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  She is hide carotid disease and under gone TCAR by the vein and vascular specialist on the right but unfortunately this is occluded.  She complains of left greater than right lower extremity claudication.  She did have Doppler studies performed 06/11/2020 that showed normal ABIs bilaterally and lower extremity arterial Doppler studies performed at [facility] 02/06/2022 that suggested an occluded right SFA.  Her Doppler studies performed 02/23/2022 revealed a right ABI of 0.73 and a left of 0.91.  She had an occluded right SFA, AT and PT.   I began her on Pletal  50 mg p.o. twice daily which resulted in marked improvement in her claudication symptoms.  Unfortunately, she does continue to smoke.  Since I saw her 8 months ago she continues to smoke 1 pack/day.  The Pletal  has afforded her some improvement in her claudication but still she is lifestyle limited.  She had carotid Dopplers performed in August that showed continued occlusion of her right internal carotid artery status post TCAR and what was  described as a string sign in the left ICA.  A neck CTA was ordered but has not been performed.   Current Meds  Medication Sig   albuterol  (VENTOLIN  HFA) 108 (90 Base) MCG/ACT inhaler Inhale 1-2 puffs into the lungs as needed.   allopurinol  (ZYLOPRIM ) 300 MG tablet Take 300 mg by mouth daily.   amLODipine -olmesartan (AZOR) 5-40 MG tablet Take 1 tablet by mouth daily.   APO-VARENICLINE  1 MG tablet    atorvastatin  (LIPITOR) 80 MG tablet Take 80 mg by mouth daily.   calcium  carbonate (TUMS - DOSED IN MG ELEMENTAL CALCIUM ) 500 MG chewable tablet Chew 2 tablets by mouth daily as needed.   carvedilol  (COREG ) 3.125 MG tablet TAKE (1) TABLET BY MOUTH TWICE DAILY.   Cholecalciferol  (VITAMIN D ) 2000 units CAPS Take 2,000 Units by mouth daily.   cilostazol  (PLETAL ) 100 MG tablet Take 1 tablet (100 mg total) by mouth 2 (two) times daily.   DULoxetine  (CYMBALTA ) 20 MG capsule Take 20 mg by mouth 2 (two) times daily.   FeFum-FePoly-FA-B Cmp-C-Biot (INTEGRA PLUS ) CAPS Take 1 capsule daily with a meal   gabapentin  (NEURONTIN ) 600 MG tablet Take 300-600 mg by mouth See admin instructions. Take 300 mg twice daily IN THE MORNING AND AT NOON AND  600 mg at bedtime.   lisinopril  (ZESTRIL ) 40 MG tablet TAKE ONE TABLET BY MOUTH EVERY MORNING   Omega-3 Fatty Acids (FISH OIL) 1360 MG CAPS Take by mouth.   Oxycodone  HCl 10 MG TABS Take 10 mg by mouth 3 (three) times daily as needed. For pain  rivaroxaban  (XARELTO ) 20 MG TABS tablet Take 20 mg by mouth daily with supper.     Allergies  Allergen Reactions   Bee Venom Anaphylaxis    Social History   Socioeconomic History   Marital status: Single    Spouse name: Not on file   Number of children: 2   Years of education: 88   Highest education level: Not on file  Occupational History   Occupation: Retired  Tobacco Use   Smoking status: Every Day    Current packs/day: 0.25    Average packs/day: 0.3 packs/day for 55.8 years (14.0 ttl pk-yrs)    Types:  Cigarettes    Start date: 06/18/1967   Smokeless tobacco: Never   Tobacco comments:    3-4 a day  Vaping Use   Vaping status: Never Used  Substance and Sexual Activity   Alcohol use: No    Alcohol/week: 0.0 standard drinks of alcohol   Drug use: No   Sexual activity: Not Currently  Other Topics Concern   Not on file  Social History Narrative   Lives at home alone.   Right-handed.   2 cups caffeine per day.   Social Drivers of Corporate Investment Banker Strain: Not on file  Food Insecurity: Not on file  Transportation Needs: Not on file  Physical Activity: Not on file  Stress: Not on file  Social Connections: Not on file  Intimate Partner Violence: Not on file     Review of Systems: General: negative for chills, fever, night sweats or weight changes.  Cardiovascular: negative for chest pain, dyspnea on exertion, edema, orthopnea, palpitations, paroxysmal nocturnal dyspnea or shortness of breath Dermatological: negative for rash Respiratory: negative for cough or wheezing Urologic: negative for hematuria Abdominal: negative for nausea, vomiting, diarrhea, bright red blood per rectum, melena, or hematemesis Neurologic: negative for visual changes, syncope, or dizziness All other systems reviewed and are otherwise negative except as noted above.    Blood pressure 128/76, pulse 76, height 5' 3 (1.6 m), weight 199 lb 9.6 oz (90.5 kg), SpO2 97%.  General appearance: alert and no distress Neck: no adenopathy, no JVD, supple, symmetrical, trachea midline, thyroid  not enlarged, symmetric, no tenderness/mass/nodules, and bilateral carotid bruits Lungs: clear to auscultation bilaterally Heart: Soft outflow tract murmur. Extremities: extremities normal, atraumatic, no cyanosis or edema Pulses: Diminished pulses bilaterally Skin: Skin color, texture, turgor normal. No rashes or lesions Neurologic: Grossly normal  EKG EKG Interpretation Date/Time:  Tuesday April 17 2023  08:16:48 EST Ventricular Rate:  76 PR Interval:  180 QRS Duration:  94 QT Interval:  408 QTC Calculation: 459 R Axis:   -27  Text Interpretation: Normal sinus rhythm Abnormal QRS-T angle, consider primary T wave abnormality When compared with ECG of 21-Feb-2017 14:37, PR interval has decreased (RBBB and left anterior fascicular block) is no longer Present Confirmed by Court Carrier [phone]) on 04/17/2023 8:19:37 AM    ASSESSMENT AND PLAN:   Atherosclerosis of native artery of right lower extremity with intermittent claudication (HCC) Jennifer [name] returns today for follow-up of PAD.  She does complain of right greater than left lower extremity claudication.  I did begin her on Pletal  which afforded her some clinical benefit although she is still somewhat limited.  Her Doppler studies performed 02/23/2022 revealed an occluded right SFA, anterior tibial and posterior tibial with moderate proximal left SFA stenosis.  We talked about the importance of smoking cessation and risk factor modification.  I am going to keep her on Pletal   or see her back in 6 months.  If she stops smoking we can explore endovascular therapy.  Carotid bruit History of right internal carotid artery revascularization by TCAR.  This has subsequently been shown to have occluded.  She did have carotid Dopplers performed which suggested string sign of the left ICA.  This was performed 11/08/2022.  A neck CTA was ordered but never performed.  I am concerned that she only has 1 carotid artery but is high-grade.  We will arrange for her to undergo CTA of her neck and follow-up with Dr. Bettyjo.     Dorn DOROTHA Lesches MD FACP,FACC,FAHA, [facility] 04/17/2023 8:37 AM

## 2023-04-17 NOTE — Patient Instructions (Signed)
Medication Instructions:  Your physician recommends that you continue on your current medications as directed. Please refer to the Current Medication list given to you today.  *If you need a refill on your cardiac medications before your next appointment, please call your pharmacy*   Testing/Procedures: Non-Cardiac CT Angiography (CTA) Neck, is a special type of CT scan that uses a computer to produce multi-dimensional views of major blood vessels throughout the body. In CT angiography, a contrast material is injected through an IV to help visualize the blood vessels    Follow-Up: At [facility], you and your health needs are our priority.  As part of our continuing mission to provide you with exceptional heart care, we have created designated Provider Care Teams.  These Care Teams include your primary Cardiologist (physician) and Advanced Practice Providers (APPs -  Physician Assistants and Nurse Practitioners) who all work together to provide you with the care you need, when you need it.  We recommend signing up for the patient portal called "MyChart".  Sign up information is provided on this After Visit Summary.  MyChart is used to connect with patients for Virtual Visits (Telemedicine).  Patients are able to view lab/test results, encounter notes, upcoming appointments, etc.  Non-urgent messages can be sent to your provider as well.   To learn more about what you can do with MyChart, go to [email or web address].    Your next appointment:   6 month(s)  Provider:   Nanetta Batty, MD   Other Instructions We will schedule you to see a PharmD to discuss cholesterol therapy.

## 2023-04-17 NOTE — Assessment & Plan Note (Signed)
 Jennifer [name] returns today for follow-up of PAD.  She does complain of right greater than left lower extremity claudication.  I did begin her on Pletal  which afforded her some clinical benefit although she is still somewhat limited.  Her Doppler studies performed 02/23/2022 revealed an occluded right SFA, anterior tibial and posterior tibial with moderate proximal left SFA stenosis.  We talked about the importance of smoking cessation and risk factor modification.  I am going to keep her on Pletal  or see her back in 6 months.  If she stops smoking we can explore endovascular therapy.

## 2023-04-17 NOTE — Assessment & Plan Note (Signed)
 History of right internal carotid artery revascularization by TCAR.  This has subsequently been shown to have occluded.  She did have carotid Dopplers performed which suggested string sign of the left ICA.  This was performed 11/08/2022.  A neck CTA was ordered but never performed.  I am concerned that she only has 1 carotid artery but is high-grade.  We will arrange for her to undergo CTA of her neck and follow-up with Dr. Bettyjo.

## 2023-04-27 DIAGNOSIS — I10 Essential (primary) hypertension: Secondary | ICD-10-CM | POA: Diagnosis not present

## 2023-04-27 DIAGNOSIS — T82856D Stenosis of peripheral vascular stent, subsequent encounter: Secondary | ICD-10-CM | POA: Diagnosis not present

## 2023-04-27 DIAGNOSIS — Z7901 Long term (current) use of anticoagulants: Secondary | ICD-10-CM | POA: Diagnosis not present

## 2023-04-27 DIAGNOSIS — G894 Chronic pain syndrome: Secondary | ICD-10-CM | POA: Diagnosis not present

## 2023-04-27 DIAGNOSIS — Z951 Presence of aortocoronary bypass graft: Secondary | ICD-10-CM | POA: Diagnosis not present

## 2023-04-30 ENCOUNTER — Ambulatory Visit (HOSPITAL_COMMUNITY)
Admission: RE | Admit: 2023-04-30 | Discharge: 2023-04-30 | Disposition: A | Discharge status: Home / Self Care | Payer: Medicare Other | Source: Ambulatory Visit | Attending: Internal Medicine | Admitting: Internal Medicine

## 2023-04-30 DIAGNOSIS — I6529 Occlusion and stenosis of unspecified carotid artery: Secondary | ICD-10-CM

## 2023-04-30 DIAGNOSIS — I708 Atherosclerosis of other arteries: Secondary | ICD-10-CM | POA: Diagnosis not present

## 2023-04-30 DIAGNOSIS — I6502 Occlusion and stenosis of left vertebral artery: Secondary | ICD-10-CM | POA: Diagnosis not present

## 2023-04-30 DIAGNOSIS — I6523 Occlusion and stenosis of bilateral carotid arteries: Secondary | ICD-10-CM | POA: Diagnosis not present

## 2023-04-30 MED ORDER — IOHEXOL 350 MG/ML SOLN
75.0000 mL | Freq: Once | INTRAVENOUS | Status: AC | PRN
  Administered 2023-04-30: 75 mL via INTRAVENOUS

## 2023-05-03 ENCOUNTER — Ambulatory Visit: Payer: Medicare Other | Admitting: Internal Medicine

## 2023-05-04 ENCOUNTER — Ambulatory Visit: Payer: Medicare Other | Admitting: Internal Medicine

## 2023-05-04 NOTE — Progress Notes (Addendum)
 Cardiology Office Note    Date:  05/08/2023  ID:  Jennifer [name], DOB 1950/03/17, MRN [id number] PCP:  Elfredia Nevins, MD  Cardiologist:  Marjo Bicker, MD  Electrophysiologist:  None   Chief Complaint: f/u CAD  History of Present Illness: .    Jennifer [name] is a 73 y.o. female with visit-pertinent history of CAD with NSTEMI 2018 s/p CABG (LIMA to LAD, SVG to OM, SVG to PDA), DVT/PE on Xarelto not prescribed through our office, tobacco abuse (intolerant of Chantix), HTN, HLD, carotid artery disease s/p prior R TCAR with subsequent occlusion, LE PAD followed by Dr. Allyson Sabal, TIA, anemia, anxiety, arthritis, depression, congenital kidney anomaly (1 kidney), ?CKD stage 3 (prior Cr 1.1-1.3), neuropathy seen for follow-up. 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 Carotid US 10/2022 showed "complete occlusion of the right common carotid artery, carotid bulb and internal carotid artery, age-indeterminate though new compared to remote carotid Doppler ultrasound performed in 2018. Large amount of left-sided atherosclerotic plaque results in a sonographic string sign and elevated peak systolic velocities within the left internal carotid artery compatible with the higher end of the 50-69% luminal narrowing range, though note, velocity measurements are unreliable in the setting of a contralateral occlusion. Further evaluation with CTA could be performed as indicated." CTA of the neck was recommended but not performed until 04/30/23, result pending. In the meantime she has been seen by Dr. Allyson Sabal 04/17/23 for her LE PAD who recommended continuation of Pletal and cessation of smoking. He stated "If she stops smoking we can explore endovascular therapy." Dr. Allyson Sabal referred her to lipid clinic pending in March. Her claudication had overall improved. She was  unable to tolerate atorvastatin 80mg  due to myalgias, so she backed down to 40mg  daily and is feeling better with this.  She returns for followup today reporting she is stable without any cardiac complaints. No CP, SOB, palpitations, new edema, or syncope. She reports her left leg is chronically larger in circumference to the right one for many years. She has not been following her BP recently. 150/68 by CMA, recheck 148/68 by me. She also reports that she has been on Xarelto for many years for remote hx DVT/PE, was told by prior PCP this was lifelong - she recently worked with someone at [place] to potentially reduce the price but then ran out of the medicine.   She is noted to be on Azor (with olmesartan) and lisinopril? She is not sure if she is taking both of these; they were prescribed by outside office. She has f/u PCP APP in a few weeks.    Labwork independently reviewed: 03/2023 scan Hgb 15.7, plt 339, Cr 1.30, K 4.4, LFTs , LDL 147, trig 232, HDL 37 07/3662 DXA Cr 1.12 4034 Cr 1.14  2020 Cr 0.96 2018 TSH OK  ROS: .    Please see the history of present illness.  All other systems are reviewed and otherwise negative.  Studies Reviewed: Marland Kitchen    EKG:  EKG not ordered today but reviewed from Dr. Hazle Coca visit 04/18/23 NSR 76bpm NSST changes similar to prior  CV Studies: Cardiac studies reviewed are outlined and summarized above. Otherwise please see EMR for full report.   Current Reported Medications:.    Current Meds  Medication Sig   albuterol (VENTOLIN HFA) 108 (90 Base) MCG/ACT inhaler Inhale 1-2  puffs into the lungs as needed.   allopurinol (ZYLOPRIM) 300 MG tablet Take 300 mg by mouth daily.   amLODipine-olmesartan (AZOR) 5-40 MG tablet Take 1 tablet by mouth daily.   APO-VARENICLINE 1 MG tablet    aspirin EC 81 MG EC tablet Take 1 tablet (81 mg total) by mouth daily.   atorvastatin (LIPITOR) 40 MG tablet Take 40 mg by mouth daily.   calcium carbonate (TUMS - DOSED IN MG  ELEMENTAL CALCIUM) 500 MG chewable tablet Chew 2 tablets by mouth daily as needed.   carvedilol (COREG) 3.125 MG tablet TAKE (1) TABLET BY MOUTH TWICE DAILY.   Cholecalciferol (VITAMIN D) 2000 units CAPS Take 2,000 Units by mouth daily.   cilostazol (PLETAL) 100 MG tablet Take 1 tablet (100 mg total) by mouth 2 (two) times daily.   DULoxetine (CYMBALTA) 20 MG capsule Take 20 mg by mouth 2 (two) times daily.   FeFum-FePoly-FA-B Cmp-C-Biot (INTEGRA PLUS) CAPS Take 1 capsule daily with a meal   gabapentin (NEURONTIN) 600 MG tablet Take 300-600 mg by mouth See admin instructions. Take 300 mg twice daily IN THE MORNING AND AT NOON AND  600 mg at bedtime.   lisinopril (ZESTRIL) 40 MG tablet TAKE ONE TABLET BY MOUTH EVERY MORNING   Omega-3 Fatty Acids (FISH OIL) 1360 MG CAPS Take by mouth.   Oxycodone HCl 10 MG TABS Take 10 mg by mouth 3 (three) times daily as needed. For pain   rivaroxaban (XARELTO) 20 MG TABS tablet Take 20 mg by mouth daily with supper. Out of med    Physical Exam:    VS:  BP (!) 150/68   Pulse 68   Ht 5\' 3"  (1.6 m)   Wt 206 lb (93.4 kg)   SpO2 91%   BMI 36.49 kg/m    Wt Readings from Last 3 Encounters:  05/08/23 206 lb (93.4 kg)  04/17/23 199 lb 9.6 oz (90.5 kg)  11/26/22 197 lb (89.4 kg)    GEN: Well nourished, well developed in no acute distress NECK: No JVD; No carotid bruits CARDIAC: RRR, 2/6 SEM known from prior, no rubs or gallops RESPIRATORY:  Clear to auscultation without rales, wheezing or rhonchi  ABDOMEN: Soft, non-tender, non-distended EXTREMITIES:  No edema; No acute deformity. Circumference of LLE simply larger than RLE  Asessement and Plan:.    1. CAD, HLD - doing well without recent angina or dyspnea. Continue ASA, carvedilol. Agree with plan for pharmD lipid clinic to address uncontrolled lipids. Continue atorvastatin 40mg  in the interim. Would benefit from PCSK9i.  2. PAD - no acute change from recent OV with Dr. Allyson Sabal. F/u with him in 6 months as  recommended. Tobacco cessation recommended. She is considering re-trial of Chantix, has at home.   3. Carotid artery disease - CT scan result still pending, will go to Dr. Antoine Poche box for further review. Lipid management as above.  4. HTN, moderate LVH with question of CKD stage 3a - BP suboptimally controlled. She is unsure what exactly she is taking. I asked her to call our office when she gets home and is able to review her medications. Her med list has both an ARB and ACEI? These were not prescribed through our office in recent years. She reports primary care has managed this and she sees them monthly. I have advised her to bring a BP log to her upcoming visit there. BP at recent PV visit was 128/76. Would be a candidate to titrate carvedilol or amlodipine if needed,  but need to ensure correct med list before adding.  HYPERTENSION CONTROL Vitals:   05/08/23 1351 05/08/23 1434  BP: (!) 150/68 (!) 148/68    The patient's blood pressure is elevated above target today.  In order to address the patient's elevated BP: Blood pressure will be monitored at home to determine if medication changes need to be made.    5. H/o DVT/PE - she reports issues with getting her last fill of Xarelto, was working with someone at an outside office to lower the cost. She is on this for h/o VTE decades ago per her report, not for cardiac reasons, historically filled and managed by PCP. Recommended she contact their office to discuss long term plan for this medication. She will call them today. Denies any acute VTE concerns today. Follow for any bleeding given concomitant ASA/Pletal, none reported.  6. Valvular heart disease - reports longstanding history of murmur. 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 Also with mild AS. Consider repeat echo 01/2025 or sooner if symptoms occur.  Addendum: post visit Dr. Jenene Slicker reviewed OV. She sent message to SET program  to re-refer for PAD. She also gave instructions to nursing staff to start Repatha and send PA to pharmD team. I replied for clarification whether she still wants patient to f/u with lipid clinic in March as well. The patient will preliminarily need repeat CMET/lipids in 3 months so sent msg to [facility] CMA to make sure this gets ordered. Addendum #2 05/19/2023 - Dr. Jenene Slicker confirmed OK to keep lipid clinic as planned to avoid pt confusion.    Disposition: F/u with Dr. Jenene Slicker in 6 months.  Signed, Laurann Montana, PA-C

## 2023-05-08 ENCOUNTER — Encounter: Payer: Self-pay | Admitting: Physician Assistant

## 2023-05-08 ENCOUNTER — Ambulatory Visit: Payer: Medicare Other | Attending: Physician Assistant | Admitting: Physician Assistant

## 2023-05-08 VITALS — BP 148/68 | HR 68 | Ht 63.0 in | Wt 206.0 lb

## 2023-05-08 DIAGNOSIS — I739 Peripheral vascular disease, unspecified: Secondary | ICD-10-CM

## 2023-05-08 DIAGNOSIS — I10 Essential (primary) hypertension: Secondary | ICD-10-CM | POA: Diagnosis not present

## 2023-05-08 DIAGNOSIS — E785 Hyperlipidemia, unspecified: Secondary | ICD-10-CM

## 2023-05-08 DIAGNOSIS — I517 Cardiomegaly: Secondary | ICD-10-CM

## 2023-05-08 DIAGNOSIS — Z86718 Personal history of other venous thrombosis and embolism: Secondary | ICD-10-CM | POA: Diagnosis not present

## 2023-05-08 DIAGNOSIS — I2510 Atherosclerotic heart disease of native coronary artery without angina pectoris: Secondary | ICD-10-CM

## 2023-05-08 DIAGNOSIS — I6523 Occlusion and stenosis of bilateral carotid arteries: Secondary | ICD-10-CM

## 2023-05-08 DIAGNOSIS — I38 Endocarditis, valve unspecified: Secondary | ICD-10-CM

## 2023-05-08 NOTE — Patient Instructions (Signed)
 Medication Instructions:  Your physician recommends that you continue on your current medications as directed. Please refer to the Current Medication list given to you today.  PLEASE CALL us WITH YOUR MEDICATION LIST SO THAT WE MAY UPDATE YOUR MEDICATIONS.  *If you need a refill on your cardiac medications before your next appointment, please call your pharmacy*   Lab Work: None If you have labs (blood work) drawn today and your tests are completely normal, you will receive your results only by: MyChart Message (if you have MyChart) OR A paper copy in the mail If you have any lab test that is abnormal or we need to change your treatment, we will call you to review the results.   Testing/Procedures: None   Follow-Up: At [facility], you and your health needs are our priority.  As part of our continuing mission to provide you with exceptional heart care, we have created designated Provider Care Teams.  These Care Teams include your primary Cardiologist (physician) and Advanced Practice Providers (APPs -  Physician Assistants and Nurse Practitioners) who all work together to provide you with the care you need, when you need it.  We recommend signing up for the patient portal called "MyChart".  Sign up information is provided on this After Visit Summary.  MyChart is used to connect with patients for Virtual Visits (Telemedicine).  Patients are able to view lab/test results, encounter notes, upcoming appointments, etc.  Non-urgent messages can be sent to your provider as well.   To learn more about what you can do with MyChart, go to [email or web address].    Your next appointment:   6 month(s)  Provider:   You may see Vishnu P Mallipeddi, MD or one of the following Advanced Practice Providers on your designated Care Team:   [place], PA-C  Jacolyn Reedy, PA-C     Other Instructions Please take your blood pressure log to your primary care provider's appointment.

## 2023-05-09 ENCOUNTER — Telehealth: Payer: Self-pay

## 2023-05-09 DIAGNOSIS — E785 Hyperlipidemia, unspecified: Secondary | ICD-10-CM

## 2023-05-09 DIAGNOSIS — I2510 Atherosclerotic heart disease of native coronary artery without angina pectoris: Secondary | ICD-10-CM

## 2023-05-09 NOTE — Telephone Encounter (Signed)
-----   Message from Laurann Montana sent at 05/09/2023  1:12 PM EST ----- Regarding: Will need CMET/lipids (fasting) in 3 months This is the patient that we are waiting on Dr. Jenene Slicker to clarify the lipid plan (either rx Repatha vs f/u lipid clinic in March). Regardless let's go ahead and ensure she has f/u CMET and lipid panel - fasting - 3 months to make sure we have this follow-up. Thank you so much!

## 2023-05-09 NOTE — Telephone Encounter (Signed)
 Lab slips mailed to pt to complete in 3 months.

## 2023-05-09 NOTE — Telephone Encounter (Addendum)
 Patient was also supposed to call to verify her medication list today but I don't see that she did. Please reach out to her to verify. Thanks!  Dr. Jenene Slicker had also sent CC'd chart re-referring patient to SET program for PAD and recommended to start Repatha and send prior auth to pharmD. I asked clarify whether she wanted to send in the rx since patient also already has pharmD lipid clinic scheduled to discuss options and initiation/teaching. Morrie Sheldon is cc'd on that as well to receive the final instruction. Plan 3 month CMET/lipid follow-up as well as below - can let patient know when she relays the med list. Appreciate collaboration of care.

## 2023-05-10 ENCOUNTER — Telehealth: Payer: Self-pay

## 2023-05-10 DIAGNOSIS — I739 Peripheral vascular disease, unspecified: Secondary | ICD-10-CM

## 2023-05-10 NOTE — Telephone Encounter (Signed)
 Referral re-entered for SET per Ronie Spies, PA and Dr. Jenene Slicker (primary cardiology)

## 2023-05-10 NOTE — Telephone Encounter (Signed)
 Attempted to contact patient x2 with no success (VM left both times). Contacted SIL, Idelia Salm and spoke to SIL spouse- leaving message for SIL to call office back in regards to patient.

## 2023-05-10 NOTE — Telephone Encounter (Signed)
 Spoke to pt's SIL who stated that she would see if she could get in touch with pt to have pt give our office a call back regarding medication list, SET referral.

## 2023-05-10 NOTE — Telephone Encounter (Signed)
-----   Message from Laurann Montana sent at 05/10/2023  7:27 AM EST ----- Regarding: RE: I am thinking we should proceed with Dr. Antoine Poche referral. Morrie Sheldon, can you help re-place referral? Thank you so much! ----- Message ----- From: Hazle Nordmann Sent: 05/09/2023  11:57 AM EST To: Laurann Montana, PA-C; Roseanne Reno, CMA; #  Good Morning,  I found her referral from August.  It was not sent correctly, thus it never populated into our workqueue.  Dr. Hazle Coca note are good for the PAD notes.  Do we want to proceed with Dr. Antoine Poche referral or should I get one from Dr. Allyson Sabal?    Given the date of August, we will need a new referral either way as they are only good for 6 month and we just missed that date.  Please advise.  Thank you! Fabio Pierce, MA, RCEP, CCRP 05/09/2023 11:57 AM ----- Message ----- From: Marjo Bicker, MD Sent: 05/09/2023  10:36 AM EST To: Laurann Montana, PA-C; Hazle Nordmann; #  Patient was referred to [facility] for PAD in August 2024. Not sure what happened. Will copy Fabio Pierce to comment. Will obtain ABIs now.  I cannot find the myalgias info in Dr Hazle Coca note. Is she having true myalgias from statin or is it claudication? Is she having rest pain from PAD? If yes, she won't be a candidate for [facility] program.   HLD can be managed by primary cardiologist. Goal LDL should be less than 55. Her LDL from Jan 2025 was in 150s. Start Repatha and send prior auth to PharmD team. ----- Message ----- From: Estrella Deeds Sent: 05/08/2023   2:42 PM EST To: Orion Modest Mallipeddi, MD  Stable in follow-up. F/u 6 months.

## 2023-05-25 DIAGNOSIS — G894 Chronic pain syndrome: Secondary | ICD-10-CM | POA: Diagnosis not present

## 2023-05-25 DIAGNOSIS — Z7901 Long term (current) use of anticoagulants: Secondary | ICD-10-CM | POA: Diagnosis not present

## 2023-05-25 DIAGNOSIS — T82856D Stenosis of peripheral vascular stent, subsequent encounter: Secondary | ICD-10-CM | POA: Diagnosis not present

## 2023-05-25 DIAGNOSIS — D6869 Other thrombophilia: Secondary | ICD-10-CM | POA: Diagnosis not present

## 2023-05-29 ENCOUNTER — Encounter: Payer: Self-pay | Admitting: Pharmacist

## 2023-05-29 ENCOUNTER — Ambulatory Visit: Payer: Medicare Other | Attending: Internal Medicine | Admitting: Pharmacist

## 2023-05-29 DIAGNOSIS — E785 Hyperlipidemia, unspecified: Secondary | ICD-10-CM

## 2023-05-29 DIAGNOSIS — I739 Peripheral vascular disease, unspecified: Secondary | ICD-10-CM

## 2023-05-29 DIAGNOSIS — I2510 Atherosclerotic heart disease of native coronary artery without angina pectoris: Secondary | ICD-10-CM | POA: Diagnosis not present

## 2023-05-29 DIAGNOSIS — I70211 Atherosclerosis of native arteries of extremities with intermittent claudication, right leg: Secondary | ICD-10-CM

## 2023-05-29 NOTE — Progress Notes (Signed)
 Patient ID: Jennifer [name]                 DOB: 25-Mar-1950                    MRN: [id number]     HPI: Jennifer [name] is a 73 y.o. female patient referred to lipid clinic by Dr Jenene Slicker. PMH is significant for CAD, PAD, CHF, hx of NSTEMI, history of CABG x3, history of DVT, and smoking.  Patient presents today in good spirits. Currently managed on atorvastatin 40mg  every other day. Was previously on atorvastatin 80mg  but this was decreased due to leg cramps. Reduced to every other day after leg cramps returned.  Reports strong family history of CAD. Mother, father and all three brothers (one deceased) had CAD.  Smokes 4-5 cigarettes a day. Has never drank alcohol.  Current Medications:  Atorvastatin 40mg  every other day  Intolerances:  Max dose high intensity statin  Risk Factors:  CAD PAD CHF Hx of NSTEMI Hx of CABG Hx of smoking Family history  LDL goal: <55  Family History: mother and father and brothers  Labs: TC 226, Trigs 232, HDL 37, LDL 147 (08/03/22)   FINDINGS: Right ABI:  0.69   Left ABI:  0.93   Right Lower Extremity: Monophasic posterior tibial and dorsalis pedis artery waveforms.   Left Lower Extremity: Multiphasic posterior tibial and dorsalis pedis artery waveforms.   0.5-0.79 Moderate PAD   IMPRESSION: 1. Moderate right lower extremity arterial occlusive disease. 2. Mild left lower extremity arterial occlusive disease.  Past Medical History:  Diagnosis Date   Anemia    Anxiety    Arthritis    pt. denies   Bell's palsy    CAD (coronary artery disease)    a. Cath 12/2014 - mild-moderate non-obstructive CAD with 60% mid RCA, 50% mid Cx-distal Cx, 30% distal LAD, 25% D2, EF 55-65%. b. 02/2017: NSTEMI with cath showing 60% LM stenosis and 100% RCA stenosis --> CT Surgery consulted with CABG on 02/21/2017 (LIMA-LAD, SVG-OM, and SVG-PDA)   Depression    Dizziness    DVT (deep venous thrombosis) (HCC)    on Xarelto   Dyspnea    W/ EXERTION pt.  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     allopurinol (ZYLOPRIM) 300 MG tablet Take 300 mg by mouth daily.     amLODipine-olmesartan (AZOR) 5-40 MG tablet Take 1 tablet by mouth daily.     APO-VARENICLINE 1 MG tablet      aspirin EC 81 MG EC tablet Take 1 tablet (81 mg total) by mouth daily.     atorvastatin (LIPITOR) 40 MG tablet Take 40 mg by mouth daily.     calcium carbonate (TUMS - DOSED IN MG ELEMENTAL CALCIUM) 500 MG chewable tablet Chew 2 tablets by mouth daily as needed.     carvedilol (COREG) 3.125 MG tablet TAKE (1) TABLET BY MOUTH TWICE DAILY.  180 tablet 3   Cholecalciferol (VITAMIN D) 2000 units CAPS Take 2,000 Units by mouth daily.     cilostazol (PLETAL) 100 MG tablet Take 1 tablet (100 mg total) by mouth 2 (two) times daily. 180 tablet 3   DULoxetine (CYMBALTA) 20 MG capsule Take 20 mg by mouth 2 (two) times daily.     FeFum-FePoly-FA-B Cmp-C-Biot (INTEGRA PLUS) CAPS Take 1 capsule daily with a meal 30 capsule 3   gabapentin (NEURONTIN) 600 MG tablet Take 300-600 mg by mouth See admin instructions. Take 300 mg twice daily IN THE MORNING AND AT NOON AND  600 mg at bedtime.     lisinopril (ZESTRIL) 40 MG tablet TAKE ONE TABLET BY MOUTH EVERY MORNING 90 tablet 2   Omega-3 Fatty Acids (FISH OIL) 1360 MG CAPS Take by mouth.     Oxycodone HCl 10 MG TABS Take 10 mg by mouth 3 (three) times daily as needed. For pain     rivaroxaban (XARELTO) 20 MG TABS tablet Take 20 mg by mouth daily with supper.     No current facility-administered medications  on file prior to visit.    Allergies  Allergen Reactions   Bee Venom Anaphylaxis    Assessment/Plan:  1. Hyperlipidemia - Patient last LDL 147 which is above goal of <55. Aggressive goal due to CAD, PAD, CHF and history of CABG. Unable to tolerate statin dosages >40mg  every other day. Will continue. Recommend addition of PCSK9i.  Using demo pen, educated patient on mechanism of action, storage, site selection, administration, and possible adverse effects. Patient able to demonstrate in room. Will complete PA and contact patient when approved. Recheck fasting lipid panel in 2-3 months.  Educated patient on foods than can increase cholesterol levels and foods that can help decrease.  Continue atorvastatin 40mg  daily Start Repatha 140mg  q 2 weeks Recheck lipid panel  Laural Golden, PharmD, BCACP, CDCES, CPP [address] [place], [place], [address] Phone: [phone], Fax: [phone]

## 2023-05-29 NOTE — Patient Instructions (Addendum)
 It was nice meeting you today  We would like your LDL (bad cholesterol) to be less than 55  Please continue your atorvastatin 40mg   The medication we discussed today is called Repatha which is an injection you would take once every 2 weeks  I will complete the prior authorization for you and contact you when it is approved  Once you start the medication we will recheck your fasting lipid panel in 2-3 months  Laural Golden, PharmD, BCACP, CDCES, CPP [address] [place], [place], [address] Phone: [phone], Fax: [phone]

## 2023-06-06 ENCOUNTER — Telehealth: Payer: Self-pay | Admitting: Internal Medicine

## 2023-06-06 NOTE — Telephone Encounter (Signed)
 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  Follows up with vascular.  Continue current medications.

## 2023-06-06 NOTE — Telephone Encounter (Signed)
 Patient return staff call regarding test results.

## 2023-06-06 NOTE — Telephone Encounter (Signed)
 Patient notified and verbalized understanding. Patient had no questions or concerns at this time.

## 2023-06-11 DIAGNOSIS — D6869 Other thrombophilia: Secondary | ICD-10-CM | POA: Diagnosis not present

## 2023-06-11 DIAGNOSIS — I10 Essential (primary) hypertension: Secondary | ICD-10-CM | POA: Diagnosis not present

## 2023-06-11 DIAGNOSIS — I25709 Atherosclerosis of coronary artery bypass graft(s), unspecified, with unspecified angina pectoris: Secondary | ICD-10-CM | POA: Diagnosis not present

## 2023-06-11 DIAGNOSIS — I509 Heart failure, unspecified: Secondary | ICD-10-CM | POA: Diagnosis not present

## 2023-06-28 DIAGNOSIS — T82856D Stenosis of peripheral vascular stent, subsequent encounter: Secondary | ICD-10-CM | POA: Diagnosis not present

## 2023-06-28 DIAGNOSIS — I10 Essential (primary) hypertension: Secondary | ICD-10-CM | POA: Diagnosis not present

## 2023-06-28 DIAGNOSIS — G894 Chronic pain syndrome: Secondary | ICD-10-CM | POA: Diagnosis not present

## 2023-07-11 DIAGNOSIS — D6869 Other thrombophilia: Secondary | ICD-10-CM | POA: Diagnosis not present

## 2023-07-11 DIAGNOSIS — I2699 Other pulmonary embolism without acute cor pulmonale: Secondary | ICD-10-CM | POA: Diagnosis not present

## 2023-07-11 DIAGNOSIS — I25709 Atherosclerosis of coronary artery bypass graft(s), unspecified, with unspecified angina pectoris: Secondary | ICD-10-CM | POA: Diagnosis not present

## 2023-07-11 DIAGNOSIS — T82856D Stenosis of peripheral vascular stent, subsequent encounter: Secondary | ICD-10-CM | POA: Diagnosis not present

## 2023-07-11 DIAGNOSIS — E782 Mixed hyperlipidemia: Secondary | ICD-10-CM | POA: Diagnosis not present

## 2023-07-27 DIAGNOSIS — R5383 Other fatigue: Secondary | ICD-10-CM | POA: Diagnosis not present

## 2023-07-27 DIAGNOSIS — I509 Heart failure, unspecified: Secondary | ICD-10-CM | POA: Diagnosis not present

## 2023-07-27 DIAGNOSIS — G894 Chronic pain syndrome: Secondary | ICD-10-CM | POA: Diagnosis not present

## 2023-07-31 DIAGNOSIS — I509 Heart failure, unspecified: Secondary | ICD-10-CM | POA: Diagnosis not present

## 2023-07-31 DIAGNOSIS — I10 Essential (primary) hypertension: Secondary | ICD-10-CM | POA: Diagnosis not present

## 2023-08-24 DIAGNOSIS — I10 Essential (primary) hypertension: Secondary | ICD-10-CM | POA: Diagnosis not present

## 2023-08-24 DIAGNOSIS — G894 Chronic pain syndrome: Secondary | ICD-10-CM | POA: Diagnosis not present

## 2023-09-21 DIAGNOSIS — G4700 Insomnia, unspecified: Secondary | ICD-10-CM | POA: Diagnosis not present

## 2023-09-21 DIAGNOSIS — G894 Chronic pain syndrome: Secondary | ICD-10-CM | POA: Diagnosis not present

## 2023-10-19 DIAGNOSIS — M79672 Pain in left foot: Secondary | ICD-10-CM | POA: Diagnosis not present

## 2023-10-19 DIAGNOSIS — S93402A Sprain of unspecified ligament of left ankle, initial encounter: Secondary | ICD-10-CM | POA: Diagnosis not present

## 2023-10-19 DIAGNOSIS — M549 Dorsalgia, unspecified: Secondary | ICD-10-CM | POA: Diagnosis not present

## 2023-10-22 ENCOUNTER — Other Ambulatory Visit (HOSPITAL_COMMUNITY): Payer: Self-pay | Admitting: Nephrology

## 2023-10-22 DIAGNOSIS — R8290 Unspecified abnormal findings in urine: Secondary | ICD-10-CM | POA: Diagnosis not present

## 2023-10-22 DIAGNOSIS — E785 Hyperlipidemia, unspecified: Secondary | ICD-10-CM | POA: Diagnosis not present

## 2023-10-22 DIAGNOSIS — N1832 Chronic kidney disease, stage 3b: Secondary | ICD-10-CM | POA: Diagnosis not present

## 2023-10-22 DIAGNOSIS — I129 Hypertensive chronic kidney disease with stage 1 through stage 4 chronic kidney disease, or unspecified chronic kidney disease: Secondary | ICD-10-CM

## 2023-10-22 DIAGNOSIS — Z720 Tobacco use: Secondary | ICD-10-CM | POA: Diagnosis not present

## 2023-10-29 ENCOUNTER — Ambulatory Visit (HOSPITAL_COMMUNITY)
Admission: RE | Admit: 2023-10-29 | Discharge: 2023-10-29 | Disposition: A | Discharge status: Home / Self Care | Source: Ambulatory Visit | Attending: Nephrology | Admitting: Nephrology

## 2023-10-29 DIAGNOSIS — R8290 Unspecified abnormal findings in urine: Secondary | ICD-10-CM | POA: Insufficient documentation

## 2023-10-29 DIAGNOSIS — N261 Atrophy of kidney (terminal): Secondary | ICD-10-CM | POA: Diagnosis not present

## 2023-10-29 DIAGNOSIS — I129 Hypertensive chronic kidney disease with stage 1 through stage 4 chronic kidney disease, or unspecified chronic kidney disease: Secondary | ICD-10-CM | POA: Diagnosis not present

## 2023-10-29 DIAGNOSIS — N281 Cyst of kidney, acquired: Secondary | ICD-10-CM | POA: Diagnosis not present

## 2023-10-29 DIAGNOSIS — N1832 Chronic kidney disease, stage 3b: Secondary | ICD-10-CM | POA: Insufficient documentation

## 2023-11-02 DIAGNOSIS — M79672 Pain in left foot: Secondary | ICD-10-CM | POA: Diagnosis not present

## 2023-11-16 DIAGNOSIS — G894 Chronic pain syndrome: Secondary | ICD-10-CM | POA: Diagnosis not present

## 2023-11-16 DIAGNOSIS — N183 Chronic kidney disease, stage 3 unspecified: Secondary | ICD-10-CM | POA: Diagnosis not present

## 2023-11-16 DIAGNOSIS — I10 Essential (primary) hypertension: Secondary | ICD-10-CM | POA: Diagnosis not present

## 2023-11-16 DIAGNOSIS — M549 Dorsalgia, unspecified: Secondary | ICD-10-CM | POA: Diagnosis not present

## 2023-11-26 DIAGNOSIS — E785 Hyperlipidemia, unspecified: Secondary | ICD-10-CM | POA: Diagnosis not present

## 2023-11-26 DIAGNOSIS — Z720 Tobacco use: Secondary | ICD-10-CM | POA: Diagnosis not present

## 2023-11-26 DIAGNOSIS — N1832 Chronic kidney disease, stage 3b: Secondary | ICD-10-CM | POA: Diagnosis not present

## 2023-11-26 DIAGNOSIS — I129 Hypertensive chronic kidney disease with stage 1 through stage 4 chronic kidney disease, or unspecified chronic kidney disease: Secondary | ICD-10-CM | POA: Diagnosis not present

## 2023-11-26 DIAGNOSIS — R809 Proteinuria, unspecified: Secondary | ICD-10-CM | POA: Diagnosis not present

## 2024-01-07 DIAGNOSIS — R809 Proteinuria, unspecified: Secondary | ICD-10-CM | POA: Diagnosis not present

## 2024-01-07 DIAGNOSIS — E785 Hyperlipidemia, unspecified: Secondary | ICD-10-CM | POA: Diagnosis not present

## 2024-01-07 DIAGNOSIS — I129 Hypertensive chronic kidney disease with stage 1 through stage 4 chronic kidney disease, or unspecified chronic kidney disease: Secondary | ICD-10-CM | POA: Diagnosis not present

## 2024-01-07 DIAGNOSIS — N1832 Chronic kidney disease, stage 3b: Secondary | ICD-10-CM | POA: Diagnosis not present

## 2024-01-07 DIAGNOSIS — Z720 Tobacco use: Secondary | ICD-10-CM | POA: Diagnosis not present

## 2024-04-25 ENCOUNTER — Ambulatory Visit: Admitting: Internal Medicine
